# Patient Record
Sex: Male | Born: 1945 | Race: Black or African American | Hispanic: No | Smoking: Former smoker
Health system: Southern US, Community
[De-identification: ages and names within clinical notes are randomized; demographics above are authoritative.]

## PROBLEM LIST (undated history)

## (undated) ENCOUNTER — Emergency Department (HOSPITAL_COMMUNITY): Admission: EM | Payer: Medicare Other

## (undated) DIAGNOSIS — R319 Hematuria, unspecified: Secondary | ICD-10-CM

## (undated) DIAGNOSIS — K649 Unspecified hemorrhoids: Secondary | ICD-10-CM

## (undated) DIAGNOSIS — E049 Nontoxic goiter, unspecified: Secondary | ICD-10-CM

## (undated) DIAGNOSIS — C61 Malignant neoplasm of prostate: Secondary | ICD-10-CM

## (undated) DIAGNOSIS — Z8719 Personal history of other diseases of the digestive system: Secondary | ICD-10-CM

## (undated) DIAGNOSIS — Z960 Presence of urogenital implants: Secondary | ICD-10-CM

## (undated) DIAGNOSIS — N529 Male erectile dysfunction, unspecified: Secondary | ICD-10-CM

## (undated) DIAGNOSIS — M2550 Pain in unspecified joint: Secondary | ICD-10-CM

## (undated) DIAGNOSIS — Z978 Presence of other specified devices: Secondary | ICD-10-CM

## (undated) DIAGNOSIS — Z87440 Personal history of urinary (tract) infections: Secondary | ICD-10-CM

## (undated) DIAGNOSIS — M1990 Unspecified osteoarthritis, unspecified site: Secondary | ICD-10-CM

## (undated) HISTORY — DX: Nontoxic goiter, unspecified: E04.9

## (undated) HISTORY — PX: INGUINAL HERNIA REPAIR: SUR1180

## (undated) HISTORY — DX: Unspecified osteoarthritis, unspecified site: M19.90

## (undated) HISTORY — DX: Male erectile dysfunction, unspecified: N52.9

---

## 1997-11-30 ENCOUNTER — Emergency Department (HOSPITAL_COMMUNITY): Admission: EM | Admit: 1997-11-30 | Discharge: 1997-11-30 | Payer: Self-pay | Admitting: Emergency Medicine

## 2003-03-31 ENCOUNTER — Emergency Department (HOSPITAL_COMMUNITY): Admission: EM | Admit: 2003-03-31 | Discharge: 2003-03-31 | Payer: Self-pay | Admitting: Emergency Medicine

## 2003-04-17 ENCOUNTER — Encounter: Admission: RE | Admit: 2003-04-17 | Discharge: 2003-04-17 | Payer: Self-pay | Admitting: General Surgery

## 2003-04-20 ENCOUNTER — Ambulatory Visit (HOSPITAL_BASED_OUTPATIENT_CLINIC_OR_DEPARTMENT_OTHER): Admission: RE | Admit: 2003-04-20 | Discharge: 2003-04-20 | Payer: Self-pay | Admitting: General Surgery

## 2003-04-20 ENCOUNTER — Ambulatory Visit (HOSPITAL_COMMUNITY): Admission: RE | Admit: 2003-04-20 | Discharge: 2003-04-20 | Payer: Self-pay | Admitting: General Surgery

## 2003-08-17 ENCOUNTER — Emergency Department (HOSPITAL_COMMUNITY): Admission: EM | Admit: 2003-08-17 | Discharge: 2003-08-17 | Payer: Self-pay | Admitting: Family Medicine

## 2011-02-21 ENCOUNTER — Encounter: Payer: Self-pay | Admitting: Emergency Medicine

## 2011-02-21 ENCOUNTER — Emergency Department (HOSPITAL_COMMUNITY): Payer: Medicare Other

## 2011-02-21 ENCOUNTER — Emergency Department (HOSPITAL_COMMUNITY)
Admission: EM | Admit: 2011-02-21 | Discharge: 2011-02-21 | Disposition: A | Discharge status: Home / Self Care | Payer: Medicare Other | Attending: Emergency Medicine | Admitting: Emergency Medicine

## 2011-02-21 DIAGNOSIS — M51379 Other intervertebral disc degeneration, lumbosacral region without mention of lumbar back pain or lower extremity pain: Secondary | ICD-10-CM | POA: Insufficient documentation

## 2011-02-21 DIAGNOSIS — F17200 Nicotine dependence, unspecified, uncomplicated: Secondary | ICD-10-CM | POA: Insufficient documentation

## 2011-02-21 DIAGNOSIS — M545 Low back pain, unspecified: Secondary | ICD-10-CM | POA: Insufficient documentation

## 2011-02-21 DIAGNOSIS — M5137 Other intervertebral disc degeneration, lumbosacral region: Secondary | ICD-10-CM | POA: Insufficient documentation

## 2011-02-21 MED ORDER — ACETAMINOPHEN-CODEINE #3 300-30 MG PO TABS
1.0000 | ORAL_TABLET | Freq: Four times a day (QID) | ORAL | Status: AC | PRN
Start: 2011-02-21 — End: 2011-03-03

## 2011-02-21 MED ORDER — IBUPROFEN 800 MG PO TABS: 800.0000 mg | ORAL_TABLET | Freq: Three times a day (TID) | ORAL | Status: AC

## 2011-02-21 MED ORDER — IBUPROFEN 800 MG PO TABS
800.0000 mg | ORAL_TABLET | Freq: Once | ORAL | Status: AC
  Administered 2011-02-21: 800 mg via ORAL
  Filled 2011-02-21: qty 1

## 2011-02-21 NOTE — ED Notes (Signed)
Patient gave verbal understanding of plan of care.

## 2011-02-21 NOTE — ED Notes (Signed)
Pt states was doing yard work and hurt back .

## 2011-02-21 NOTE — ED Provider Notes (Signed)
History     CSN: [id number] Arrival date & time: 02/21/2011 10:44 AM   First MD Initiated Contact with Patient 02/21/11 1057      Chief Complaint  Patient presents with  . Back Pain    (Consider location/radiation/quality/duration/timing/severity/associated sxs/prior treatment) HPI  Patient states he has no known medical problems takes no medicine on a regular basis presents to emergency department complaining of lower back pain. Patient states that he was removing leaves out of his gutter approximally 5 days ago and twisted his back feeling a "pop" and since then ongoing midline lower back pain. Patient states pain is aggravated by touch and movement and has been improved temporarily with at-home ibuprofen use of 400 mg with last dose yesterday. Patient denies lower extremity numbness, tingling, weakness, saddle seat paresthesias, loss of bowel or bladder function. Patient denies radiation of pain. Denies pain in lower extremities. Patient denies abdominal pain, nausea or vomiting. History reviewed. No pertinent past medical history.  History reviewed. No pertinent past surgical history.  History reviewed. No pertinent family history.  History  Substance Use Topics  . Smoking status: Current Everyday Smoker -- 0.5 packs/day    Types: Cigarettes  . Smokeless tobacco: Not on file  . Alcohol Use: Yes      Review of Systems  All other systems reviewed and are negative.    Allergies  Review of patient's allergies indicates no known allergies.  Home Medications   Current Outpatient Rx  Name Route Sig Dispense Refill  . IBUPROFEN 200 MG PO TABS Oral Take 400 mg by mouth every 6 (six) hours as needed. pain       BP 122/80  Pulse 78  Temp(Src) 98 F (36.7 C) (Oral)  Resp 16  SpO2 100%  Physical Exam  Nursing note and vitals reviewed. Constitutional: He is oriented to person, place, and time. He appears well-developed and well-nourished. No distress.  HENT:  Head:  Normocephalic and atraumatic.  Eyes: Conjunctivae are normal.  Neck: Normal range of motion. Neck supple.  Cardiovascular: Normal rate, regular rhythm, normal heart sounds and intact distal pulses.  Exam reveals no gallop and no friction rub.   No murmur heard. Pulmonary/Chest: Effort normal and breath sounds normal. No respiratory distress. He has no wheezes. He has no rales. He exhibits no tenderness.  Abdominal: Bowel sounds are normal. He exhibits no distension and no mass. There is no tenderness. There is no rebound and no guarding.  Musculoskeletal: Normal range of motion. He exhibits tenderness. He exhibits no edema.       Lumbar back: He exhibits tenderness and bony tenderness. He exhibits no swelling and no edema.       Mild to moderate tenderness to palpation of lower midline lumbar region without surrounding soft tissue tenderness to palpation. Mild to moderate pain with flexion from waist that point tenderness. Full range of motion large hernias without difficulty or pain. 5 out of 5 strength of lower extremities and normal reflexes bilaterally.  Neurological: He is alert and oriented to person, place, and time. He has normal reflexes.  Skin: Skin is warm and dry. No rash noted. He is not diaphoretic. No erythema.  Psychiatric: He has a normal mood and affect.    ED Course  Procedures (including critical care time)  PO ibuprofen  Labs Reviewed - No data to display Dg Lumbar Spine Complete  02/21/2011  *RADIOLOGY REPORT*  Clinical Data: Low back pain.  LUMBAR SPINE - COMPLETE 4+ VIEW  Comparison: 08/17/2003  Findings: Mild lateral and anterior spurring throughout the lumbar spine.  Normal alignment.  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  Mild facet disease at the lumbosacral junction.  No fracture.  IMPRESSION: Spondylosis.  No acute findings.  Original Report Authenticated By: Cyndie Chime, M.D.     No diagnosis found.    MDM    Patient has no signs or symptoms of central cord compression or cauda equina. Bony midline tenderness of lower lumbar region without acute findings on x-ray of L-spine. Patient has 5 out of 5 strength of bilateral lower extremities with normal reflexes bilaterally. Patient ambulating without difficulty. No skin changes or rash.        Jenness Corner, [place] 02/21/11 1558

## 2011-02-21 NOTE — ED Provider Notes (Signed)
Medical screening examination/treatment/procedure(s) were performed by non-physician practitioner and as supervising physician I was immediately available for consultation/collaboration.    Kenadie Royce, MD 02/21/11 1615 

## 2011-08-05 ENCOUNTER — Emergency Department (HOSPITAL_COMMUNITY): Payer: Medicare Other

## 2011-08-05 ENCOUNTER — Encounter (HOSPITAL_COMMUNITY): Payer: Self-pay

## 2011-08-05 ENCOUNTER — Emergency Department (HOSPITAL_COMMUNITY)
Admission: EM | Admit: 2011-08-05 | Discharge: 2011-08-05 | Disposition: A | Discharge status: Home / Self Care | Payer: Medicare Other | Attending: Emergency Medicine | Admitting: Emergency Medicine

## 2011-08-05 DIAGNOSIS — M47812 Spondylosis without myelopathy or radiculopathy, cervical region: Secondary | ICD-10-CM | POA: Diagnosis not present

## 2011-08-05 DIAGNOSIS — F17200 Nicotine dependence, unspecified, uncomplicated: Secondary | ICD-10-CM | POA: Diagnosis not present

## 2011-08-05 DIAGNOSIS — L0201 Cutaneous abscess of face: Secondary | ICD-10-CM | POA: Insufficient documentation

## 2011-08-05 DIAGNOSIS — L255 Unspecified contact dermatitis due to plants, except food: Secondary | ICD-10-CM | POA: Diagnosis not present

## 2011-08-05 DIAGNOSIS — L259 Unspecified contact dermatitis, unspecified cause: Secondary | ICD-10-CM

## 2011-08-05 DIAGNOSIS — L03211 Cellulitis of face: Secondary | ICD-10-CM | POA: Insufficient documentation

## 2011-08-05 DIAGNOSIS — L0390 Cellulitis, unspecified: Secondary | ICD-10-CM

## 2011-08-05 DIAGNOSIS — R5381 Other malaise: Secondary | ICD-10-CM | POA: Diagnosis not present

## 2011-08-05 DIAGNOSIS — I889 Nonspecific lymphadenitis, unspecified: Secondary | ICD-10-CM | POA: Diagnosis not present

## 2011-08-05 DIAGNOSIS — T622X1A Toxic effect of other ingested (parts of) plant(s), accidental (unintentional), initial encounter: Secondary | ICD-10-CM | POA: Insufficient documentation

## 2011-08-05 DIAGNOSIS — R599 Enlarged lymph nodes, unspecified: Secondary | ICD-10-CM | POA: Diagnosis not present

## 2011-08-05 LAB — POCT I-STAT, CHEM 8
BUN: 10 mg/dL (ref 6–23)
Calcium, Ion: 1.22 mmol/L (ref 1.12–1.32)
Chloride: 102 meq/L (ref 96–112)
Creatinine, Ser: 0.9 mg/dL (ref 0.50–1.35)
Glucose, Bld: 99 mg/dL (ref 70–99)
HCT: 45 % (ref 39.0–52.0)
Hemoglobin: 15.3 g/dL (ref 13.0–17.0)
Potassium: 3.9 meq/L (ref 3.5–5.1)
Sodium: 139 meq/L (ref 135–145)
TCO2: 27 mmol/L (ref 0–100)

## 2011-08-05 MED ORDER — FAMOTIDINE 20 MG PO TABS: 20.0000 mg | ORAL_TABLET | Freq: Two times a day (BID) | ORAL | Status: DC

## 2011-08-05 MED ORDER — IOHEXOL 300 MG/ML  SOLN
100.0000 mL | Freq: Once | INTRAMUSCULAR | Status: AC | PRN
  Administered 2011-08-05: 100 mL via INTRAVENOUS

## 2011-08-05 MED ORDER — SODIUM CHLORIDE 0.9 % IV BOLUS (SEPSIS)
1000.0000 mL | Freq: Once | INTRAVENOUS | Status: AC
  Administered 2011-08-05: 1000 mL via INTRAVENOUS

## 2011-08-05 MED ORDER — METHYLPREDNISOLONE SODIUM SUCC 125 MG IJ SOLR
125.0000 mg | Freq: Once | INTRAMUSCULAR | Status: AC
  Administered 2011-08-05: 125 mg via INTRAVENOUS
  Filled 2011-08-05: qty 2

## 2011-08-05 MED ORDER — SODIUM CHLORIDE 0.9 % IV SOLN
INTRAVENOUS | Status: DC
  Administered 2011-08-05 (×2): via INTRAVENOUS

## 2011-08-05 MED ORDER — PREDNISONE 10 MG PO TABS: ORAL_TABLET | ORAL | Status: DC

## 2011-08-05 MED ORDER — DIPHENHYDRAMINE HCL 50 MG/ML IJ SOLN
25.0000 mg | Freq: Once | INTRAMUSCULAR | Status: AC
  Administered 2011-08-05: 25 mg via INTRAVENOUS
  Filled 2011-08-05: qty 1

## 2011-08-05 MED ORDER — FAMOTIDINE IN NACL 20-0.9 MG/50ML-% IV SOLN
20.0000 mg | Freq: Once | INTRAVENOUS | Status: AC
  Administered 2011-08-05: 20 mg via INTRAVENOUS
  Filled 2011-08-05: qty 50

## 2011-08-05 MED ORDER — SULFAMETHOXAZOLE-TRIMETHOPRIM 800-160 MG PO TABS: 2.0000 | ORAL_TABLET | Freq: Two times a day (BID) | ORAL | Status: AC

## 2011-08-05 NOTE — Discharge Instructions (Signed)
Drink plenty of fluids. Take the prednisone and Pepcid until gone. Take Benadryl 50 mg every 4-6 hours for itching and swelling. Take the Septra DS for possible infection (should be $4 at [employer], or [facility] or [facility]). Return to the emergency department if you get fever, have trouble breathing, or are unable to swallow. Call Dr.Wolicki's office to get a followup appointment to see if you need to have a biopsy of the lymph nodes in your neck.

## 2011-08-05 NOTE — ED Notes (Signed)
Pt in from home states came in contact with poison ivy 1 week ago states swelling to lips states feels like he got it in his mouth states can't eat states nausea and decreased appetite denies difficulty swallowing/breathing

## 2011-08-05 NOTE — ED Provider Notes (Signed)
History     CSN: [id number]  Arrival date & time 08/05/11  1640   First MD Initiated Contact with Patient 08/05/11 1655      Chief Complaint  Patient presents with  . Rash  . Weakness    (Consider location/radiation/quality/duration/timing/severity/associated sxs/prior treatment) HPI  Patient relates 8 days ago he was out trimming weeds off of a fence and he recalls a vine slapping him across the face. He reports the next day he started getting lesions on his upper lip consistent with poison ivy with blistering of clear fluid. He relates he has been using calamine lotion on it. He reports a decrease in his appetite for the past 4 days but denies lesions in his mouth. He denies fever. He states the rash has been very itching. He relates he started noticing some swelling underneath his jaw it is nonpainful. On gross inspection patient also noted to have a moderate swelling in the area of his left lateral neck which she states was evaluated about 5 years ago and he was told it was a "goiter". Patient relates he has never had a skin rash to poison ivy or oak in the past.  PCP none   History reviewed. No pertinent past medical history.  History reviewed. No pertinent past surgical history.  No family history on file.  History  Substance Use Topics  . Smoking status: Current Everyday Smoker -- 0.5 packs/day    Types: Cigarettes  . Smokeless tobacco: Not on file  . Alcohol Use: Yes  lives with spouse   Review of Systems  All other systems reviewed and are negative.    Allergies  Poison ivy extract  Home Medications  No current outpatient prescriptions on file.  BP 140/87  Pulse 88  Temp(Src) 98.1 F (36.7 C) (Oral)  Resp 16  SpO2 100%  Vital signs normal    Physical Exam  Nursing note and vitals reviewed. Constitutional: He is oriented to person, place, and time. He appears well-developed and well-nourished.  Non-toxic appearance. He does not appear ill. No  distress.  HENT:  Head: Normocephalic and atraumatic.  Right Ear: External ear normal.  Left Ear: External ear normal.  Nose: Nose normal. No mucosal edema or rhinorrhea.  Mouth/Throat: Oropharynx is clear and moist and mucous membranes are normal. No dental abscesses or uvula swelling.       he has a firm 3 cm nodular mass in his submental region that appears to be nontender, patient's noted to have bulging on the left side of his neck with a soft mass that appears to be underneath the strap muscles questionable consistency like a lipoma. Patient states he was told it was a goiter however he has no swelling in the midline of his neck.  Eyes: Conjunctivae and EOM are normal. Pupils are equal, round, and reactive to light.  Neck: Normal range of motion and full passive range of motion without pain. Neck supple.  Pulmonary/Chest: Effort normal. No respiratory distress. He has no rhonchi. He exhibits no crepitus.  Abdominal: Normal appearance.  Musculoskeletal: Normal range of motion.       Moves all extremities well.   Neurological: He is alert and oriented to person, place, and time. He has normal strength. No cranial nerve deficit.  Skin: Skin is warm, dry and intact. No rash noted. No erythema. No pallor.  Psychiatric: He has a normal mood and affect. His speech is normal and behavior is normal. His mood appears not anxious.  ED Course  Procedures (including critical care time)     Medications  0.9 %  sodium chloride infusion (  Intravenous New Bag/Given 08/05/11 1942)  predniSONE (DELTASONE) 10 MG tablet (not administered)  famotidine (PEPCID) 20 MG tablet (not administered)  sulfamethoxazole-trimethoprim (SEPTRA DS) 800-160 MG per tablet (not administered)  sodium chloride 0.9 % bolus 1,000 mL (1000 mL Intravenous Given 08/05/11 1739)  methylPREDNISolone sodium succinate (SOLU-MEDROL) 125 mg/2 mL injection 125 mg (125 mg Intravenous Given 08/05/11 1737)  famotidine (PEPCID) IVPB 20  mg (20 mg Intravenous Given 08/05/11 1814)  diphenhydrAMINE (BENADRYL) injection 25 mg (25 mg Intravenous Given 08/05/11 1736)  iohexol (OMNIPAQUE) 300 MG/ML solution 100 mL (100 mL Intravenous Contrast Given 08/05/11 1818)   Pt states he is feeling better. Have discussed his CT results.   Results for orders placed during the hospital encounter of 08/05/11  POCT I-STAT, CHEM 8      Component Value Range   Sodium 139  135 - 145 (mEq/L)   Potassium 3.9  3.5 - 5.1 (mEq/L)   Chloride 102  96 - 112 (mEq/L)   BUN 10  6 - 23 (mg/dL)   Creatinine, Ser 0.93  0.50 - 1.35 (mg/dL)   Glucose, Bld 99  70 - 99 (mg/dL)   Calcium, Ion 2.67  1.24 - 1.32 (mmol/L)   TCO2 27  0 - 100 (mmol/L)   Hemoglobin 15.3  13.0 - 17.0 (g/dL)   HCT 58.0  99.8 - 33.8 (%)   Laboratory interpretation all normal  Ct Soft Tissue Neck W Contrast  08/05/2011  *RADIOLOGY REPORT*  Clinical Data: Swelling in the left lateral neck.  Submandibular swelling.  Swelling of lips.  Possible exposure to poison ivy.  CT NECK WITH CONTRAST  Technique:  Multidetector CT imaging of the neck was performed with intravenous contrast.  Contrast: OMNIPAQUE IOHEXOL 300 MG/ML  SOLN  Comparison: None.  Findings: Diffuse subcutaneous edematous changes and skin thickening are present about the lips and periorbital face.  Skin thickening is more prominent on the left.  A left paramidline submental lymph node is hyperdense and rounded, measuring 2.0 x 1.8 x 1.8 cm.  A slightly smaller right paramidline submental lymph node is evident.  A homogeneously enhancing right submandibular lymph node measures 1.9 x 2.7 x 1.7 cm.   A left submandibular node measures 1.2 x 2.1 x 1.3 mm.  No significant mucosal or submucosal lesion is present.  The vocal cords are midline and symmetric.  The left lobe of the thyroid is diffusely enlarged.  It is heterogeneous with several calcifications, measuring 3.9 x 5.2 x 11.7 cm.  Several smaller nodules are present in the right  lobe.  Enlarged posterior level IV and supraclavicular lymph nodes are present bilaterally.  The largest left-sided node measures 19 x 12 mm.  The largest right-sided node is a posterior level III node, measuring 1.6 x 1.1 cm.  Level II nodes are smaller.  A high right paratracheal lymph node measures 10 mm in short axis.  Other smaller superior mediastinal nodes are present as well.  The lung apices are clear.  The mild degenerative changes are noted in the lower cervical spine.  IMPRESSION:  1.  Enlarged rounded submental and bilateral submandibular lymph nodes. 2.  Diffuse edematous changes of the periorbital face, worse on the left.  No discrete mass is evident.  This could be related to a toxic exposure.  The findings are compatible with cellulitis. 3.  Additional enlarged lymph nodes  are present in the posterior level III, level IV, and supraclavicular stations.  Although the level I nodes could be related to the edematous changes of the face, the diffuse adenopathy raises concern for lymphoma or leukemia. 4.  Minimal spondylosis. 5.  Diffuse heterogeneous enlargement of the left lobe of the thyroid, compatible with a multinodular goiter.  It is unclear if the entire left lobe represents a single enlarged nodule or multiple tiny nodules. 6.  Smaller nodules are present in the right lobe which is not significantly enlarged.  Original Report Authenticated By: Jamesetta Orleans. MATTERN, M.D.       1. Contact dermatitis   2. Cellulitis   3. Cervical lymphadenitis     New Prescriptions   FAMOTIDINE (PEPCID) 20 MG TABLET    Take 1 tablet (20 mg total) by mouth 2 (two) times daily.   PREDNISONE (DELTASONE) 10 MG TABLET    Take 3 po QD x 2d starting tomorrow, then 2 po QD x 3d then 1 po QD x 3d   SULFAMETHOXAZOLE-TRIMETHOPRIM (SEPTRA DS) 800-160 MG PER TABLET    Take 2 tablets by mouth 2 (two) times daily.   Plan discharge Devoria Albe, MD, FACEP   MDM          Ward Givens, MD 08/05/11 2023

## 2011-08-05 NOTE — ED Notes (Signed)
Pt also states generalized weakness

## 2012-06-25 ENCOUNTER — Ambulatory Visit (INDEPENDENT_AMBULATORY_CARE_PROVIDER_SITE_OTHER): Payer: Medicare PPO | Admitting: Internal Medicine

## 2012-06-25 ENCOUNTER — Other Ambulatory Visit (INDEPENDENT_AMBULATORY_CARE_PROVIDER_SITE_OTHER): Payer: Medicare PPO

## 2012-06-25 ENCOUNTER — Encounter: Payer: Self-pay | Admitting: Internal Medicine

## 2012-06-25 VITALS — BP 130/82 | HR 78 | Temp 97.7°F | Ht 72.0 in | Wt 179.8 lb

## 2012-06-25 DIAGNOSIS — R5383 Other fatigue: Secondary | ICD-10-CM

## 2012-06-25 DIAGNOSIS — R5381 Other malaise: Secondary | ICD-10-CM

## 2012-06-25 DIAGNOSIS — Z1211 Encounter for screening for malignant neoplasm of colon: Secondary | ICD-10-CM

## 2012-06-25 DIAGNOSIS — M1990 Unspecified osteoarthritis, unspecified site: Secondary | ICD-10-CM

## 2012-06-25 DIAGNOSIS — E049 Nontoxic goiter, unspecified: Secondary | ICD-10-CM

## 2012-06-25 DIAGNOSIS — Z13220 Encounter for screening for lipoid disorders: Secondary | ICD-10-CM

## 2012-06-25 DIAGNOSIS — Z125 Encounter for screening for malignant neoplasm of prostate: Secondary | ICD-10-CM

## 2012-06-25 DIAGNOSIS — N529 Male erectile dysfunction, unspecified: Secondary | ICD-10-CM

## 2012-06-25 DIAGNOSIS — M129 Arthropathy, unspecified: Secondary | ICD-10-CM

## 2012-06-25 HISTORY — DX: Nontoxic goiter, unspecified: E04.9

## 2012-06-25 HISTORY — DX: Male erectile dysfunction, unspecified: N52.9

## 2012-06-25 LAB — LIPID PANEL
Cholesterol: 160 mg/dL (ref 0–200)
HDL: 46.8 mg/dL (ref >39.00)
LDL Cholesterol: 98 mg/dL (ref 0–99)
Triglycerides: 74 mg/dL (ref 0.0–149.0)
VLDL: 14.8 mg/dL (ref 0.0–40.0)

## 2012-06-25 LAB — CBC
MCHC: 33.3 g/dL (ref 30.0–36.0)
RDW: 15.1 % — ABNORMAL HIGH (ref 11.5–14.6)

## 2012-06-25 LAB — T3: T3, Total: 105.7 ng/dL (ref 80.0–204.0)

## 2012-06-25 LAB — BASIC METABOLIC PANEL
Chloride: 105 mEq/L (ref 96–112)
Potassium: 4.1 mEq/L (ref 3.5–5.1)
Sodium: 140 mEq/L (ref 135–145)

## 2012-06-25 MED ORDER — VARDENAFIL HCL 5 MG PO TABS: 5.0000 mg | ORAL_TABLET | ORAL | Status: DC | PRN

## 2012-06-25 MED ORDER — DICLOFENAC SODIUM 75 MG PO TBEC: 75.0000 mg | DELAYED_RELEASE_TABLET | Freq: Two times a day (BID) | ORAL | Status: DC

## 2012-06-25 NOTE — Assessment & Plan Note (Signed)
eRx for diclofenac BID Take with food to avoid stomach irritation

## 2012-06-25 NOTE — Progress Notes (Signed)
HPI  Pt presents to the clinic today to establish care. He has not seen a PCP in the past. He has no concerns today.  Flu: never Tetanus: 2009 Pneumovax: never Zostavax: never Colonoscopy: never Eye doctor: as needed Dentist: as needed  History reviewed. No pertinent past medical history.  No current outpatient prescriptions on file.   No current facility-administered medications for this visit.    Allergies  Allergen Reactions  . Poison Ivy Extract (Extract Of Poison Ivy) Nausea And Vomiting and Rash    Family History  Problem Relation Age of Onset  . Heart disease Sister   . Stroke Sister   . Cancer Paternal Uncle   . Diabetes Neg Hx     History   Social History  . Marital Status: Divorced    Spouse Name: N/A    Number of Children: N/A  . Years of Education: 9   Occupational History  . Retired     Press photographer   Social History Main Topics  . Smoking status: Current Every Day Smoker -- 0.25 packs/day for 25 years    Types: Cigarettes  . Smokeless tobacco: Never Used  . Alcohol Use: Yes  . Drug Use: Yes    Special: Marijuana  . Sexually Active: Yes   Other Topics Concern  . Not on file   Social History Narrative   Regular exercise-no   Caffeine Use-yes    ROS:  Constitutional: Pt reports fatigue. Denies fever, malaise, headache or abrupt weight changes.  HEENT: Denies eye pain, eye redness, ear pain, ringing in the ears, wax buildup, runny nose, nasal congestion, bloody nose, or sore throat. Respiratory: Denies difficulty breathing, shortness of breath, cough or sputum production.   Cardiovascular: Denies chest pain, chest tightness, palpitations or swelling in the hands or feet.  Gastrointestinal: Denies abdominal pain, bloating, constipation, diarrhea or blood in the stool.  GU: Pt reports erectile dysfunction. Denies frequency, urgency, pain with urination, blood in urine, odor or discharge. 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 Denies decrease in range of motion, difficulty with gait, muscle pain or joint swelling.  Skin: Denies redness, rashes, lesions or ulcercations.  Neurological: Denies dizziness, difficulty with memory, difficulty with speech or problems with balance and coordination.   No other specific complaints in a complete review of systems (except as listed in HPI above).  PE:  BP 130/82  Pulse 78  Temp(Src) 97.7 F (36.5 C) (Oral)  Ht 6' (1.829 m)  Wt 179 lb 12 oz (81.534 kg)  BMI 24.37 kg/m2  SpO2 95% Wt Readings from Last 3 Encounters:  06/25/12 179 lb 12 oz (81.534 kg)    General: Appears his stated age, well developed, well nourished in NAD. HEENT: Head: normal shape and size; Eyes: sclera white, no icterus, conjunctiva pink, PERRLA and EOMs intact; Ears: Tm's gray and intact, normal light reflex; Nose: mucosa pink and moist, septum midline; Throat/Mouth: Teeth present, mucosa pink and moist, no lesions or ulcerations noted.  Neck: Normal range of motion. Neck supple, trachea midline. No thyromegaly present. Large goiter on left side. Cardiovascular: Normal rate and rhythm. S1,S2 noted.  No murmur, rubs or gallops noted. No JVD or BLE edema. No carotid bruits noted. Pulmonary/Chest: Normal effort and positive vesicular breath sounds. No respiratory distress. No wheezes, rales or ronchi noted.  Abdomen: Soft and nontender. Normal bowel sounds, no bruits noted. No distention or masses noted. Liver, spleen and kidneys non palpable. Musculoskeletal: Normal range of  motion. No signs of joint swelling. No difficulty with gait.  Neurological: Alert and oriented. Cranial nerves II-XII intact. Coordination normal. +DTRs bilaterally. Psychiatric: Mood and affect normal. Behavior is normal. Judgment and thought content normal.      Assessment and Plan:  Preventative Health Maintenance:  Encouraged pt to stop smoking, counseled for approx 5 minutes, cessation materials  given Will obtain some basic labwork today.

## 2012-06-25 NOTE — Patient Instructions (Signed)
Health Maintenance, Males A healthy lifestyle and preventative care can promote health and wellness.  Maintain regular health, dental, and eye exams.  Eat a healthy diet. Foods like vegetables, fruits, whole grains, low-fat dairy products, and lean protein foods contain the nutrients you need without too many calories. Decrease your intake of foods high in solid fats, added sugars, and salt. Get information about a proper diet from your caregiver, if necessary.  Regular physical exercise is one of the most important things you can do for your health. Most adults should get at least 150 minutes of moderate-intensity exercise (any activity that increases your heart rate and causes you to sweat) each week. In addition, most adults need muscle-strengthening exercises on 2 or more days a week.   Maintain a healthy weight. The body mass index (BMI) is a screening tool to identify possible weight problems. It provides an estimate of body fat based on height and weight. Your caregiver can help determine your BMI, and can help you achieve or maintain a healthy weight. For adults 20 years and older:  A BMI below 18.5 is considered underweight.  A BMI of 18.5 to 24.9 is normal.  A BMI of 25 to 29.9 is considered overweight.  A BMI of 30 and above is considered obese.  Maintain normal blood lipids and cholesterol by exercising and minimizing your intake of saturated fat. Eat a balanced diet with plenty of fruits and vegetables. Blood tests for lipids and cholesterol should begin at age 20 and be repeated every 5 years. If your lipid or cholesterol levels are high, you are over 50, or you are a high risk for heart disease, you may need your cholesterol levels checked more frequently.Ongoing high lipid and cholesterol levels should be treated with medicines, if diet and exercise are not effective.  If you smoke, find out from your caregiver how to quit. If you do not use tobacco, do not start.  If you  choose to drink alcohol, do not exceed 2 drinks per day. One drink is considered to be 12 ounces (355 mL) of beer, 5 ounces (148 mL) of wine, or 1.5 ounces (44 mL) of liquor.  Avoid use of street drugs. Do not share needles with anyone. Ask for help if you need support or instructions about stopping the use of drugs.  High blood pressure causes heart disease and increases the risk of stroke. Blood pressure should be checked at least every 1 to 2 years. Ongoing high blood pressure should be treated with medicines if weight loss and exercise are not effective.  If you are 45 to 68 years old, ask your caregiver if you should take aspirin to prevent heart disease.  Diabetes screening involves taking a blood sample to check your fasting blood sugar level. This should be done once every 3 years, after age 45, if you are within normal weight and without risk factors for diabetes. Testing should be considered at a younger age or be carried out more frequently if you are overweight and have at least 1 risk factor for diabetes.  Colorectal cancer can be detected and often prevented. Most routine colorectal cancer screening begins at the age of 50 and continues through age 75. However, your caregiver may recommend screening at an earlier age if you have risk factors for colon cancer. On a yearly basis, your caregiver may provide home test kits to check for hidden blood in the stool. Use of a small camera at the end of a tube,   to directly examine the colon (sigmoidoscopy or colonoscopy), can detect the earliest forms of colorectal cancer. Talk to your caregiver about this at age 50, when routine screening begins. Direct examination of the colon should be repeated every 5 to 10 years through age 75, unless early forms of pre-cancerous polyps or small growths are found.  Hepatitis C blood testing is recommended for all people born from 1945 through 1965 and any individual with known risks for hepatitis C.  Healthy  men should no longer receive prostate-specific antigen (PSA) blood tests as part of routine cancer screening. Consult with your caregiver about prostate cancer screening.  Testicular cancer screening is not recommended for adolescents or adult males who have no symptoms. Screening includes self-exam, caregiver exam, and other screening tests. Consult with your caregiver about any symptoms you have or any concerns you have about testicular cancer.  Practice safe sex. Use condoms and avoid high-risk sexual practices to reduce the spread of sexually transmitted infections (STIs).  Use sunscreen with a sun protection factor (SPF) of 30 or greater. Apply sunscreen liberally and repeatedly throughout the day. You should seek shade when your shadow is shorter than you. Protect yourself by wearing long sleeves, pants, a wide-brimmed hat, and sunglasses year round, whenever you are outdoors.  Notify your caregiver of new moles or changes in moles, especially if there is a change in shape or color. Also notify your caregiver if a mole is larger than the size of a pencil eraser.  A one-time screening for abdominal aortic aneurysm (AAA) and surgical repair of large AAAs by sound wave imaging (ultrasonography) is recommended for ages 65 to 75 years who are current or former smokers.  Stay current with your immunizations. Document Released: 09/09/2007 Document Revised: 06/05/2011 Document Reviewed: 08/08/2010 [employer] Patient Information 2013 [employer].  

## 2012-06-25 NOTE — Assessment & Plan Note (Signed)
Will check thyroid levels today May need ultrasound of large goiter on right side

## 2012-06-25 NOTE — Assessment & Plan Note (Signed)
Will trial levitra

## 2012-06-26 ENCOUNTER — Encounter: Payer: Self-pay | Admitting: Internal Medicine

## 2012-06-26 ENCOUNTER — Other Ambulatory Visit: Payer: Self-pay | Admitting: Internal Medicine

## 2012-06-26 DIAGNOSIS — R972 Elevated prostate specific antigen [PSA]: Secondary | ICD-10-CM

## 2012-07-16 DIAGNOSIS — N529 Male erectile dysfunction, unspecified: Secondary | ICD-10-CM | POA: Diagnosis not present

## 2012-07-16 DIAGNOSIS — N401 Enlarged prostate with lower urinary tract symptoms: Secondary | ICD-10-CM | POA: Diagnosis not present

## 2012-07-16 DIAGNOSIS — R972 Elevated prostate specific antigen [PSA]: Secondary | ICD-10-CM | POA: Diagnosis not present

## 2012-08-01 ENCOUNTER — Other Ambulatory Visit: Payer: Self-pay | Admitting: Internal Medicine

## 2012-08-01 ENCOUNTER — Ambulatory Visit (AMBULATORY_SURGERY_CENTER): Payer: Medicare PPO | Admitting: *Deleted

## 2012-08-01 VITALS — Ht 73.0 in | Wt 179.6 lb

## 2012-08-01 DIAGNOSIS — Z1211 Encounter for screening for malignant neoplasm of colon: Secondary | ICD-10-CM

## 2012-08-01 MED ORDER — NA SULFATE-K SULFATE-MG SULF 17.5-3.13-1.6 GM/177ML PO SOLN: 1.0000 | Freq: Once | ORAL | Status: DC

## 2012-08-01 NOTE — Progress Notes (Signed)
No egg or soy allergy. ewm No problems with sedation in the past. ewm No oxygen use at home. ewm

## 2012-08-13 DIAGNOSIS — R972 Elevated prostate specific antigen [PSA]: Secondary | ICD-10-CM | POA: Diagnosis not present

## 2012-08-15 ENCOUNTER — Encounter: Payer: Medicare PPO | Admitting: Internal Medicine

## 2012-08-26 ENCOUNTER — Other Ambulatory Visit (HOSPITAL_COMMUNITY): Payer: Self-pay | Admitting: Urology

## 2012-08-26 DIAGNOSIS — N529 Male erectile dysfunction, unspecified: Secondary | ICD-10-CM | POA: Diagnosis not present

## 2012-08-26 DIAGNOSIS — C61 Malignant neoplasm of prostate: Secondary | ICD-10-CM | POA: Diagnosis not present

## 2012-09-05 ENCOUNTER — Encounter (HOSPITAL_COMMUNITY): Payer: Self-pay

## 2012-09-05 ENCOUNTER — Encounter (HOSPITAL_COMMUNITY)
Admission: RE | Admit: 2012-09-05 | Discharge: 2012-09-05 | Disposition: A | Discharge status: Home / Self Care | Payer: Medicare Other | Source: Ambulatory Visit | Attending: Urology | Admitting: Urology

## 2012-09-05 DIAGNOSIS — C61 Malignant neoplasm of prostate: Secondary | ICD-10-CM | POA: Diagnosis not present

## 2012-09-05 MED ORDER — TECHNETIUM TC 99M MEDRONATE IV KIT
25.6000 | PACK | Freq: Once | INTRAVENOUS | Status: AC | PRN
  Administered 2012-09-05: 25.6 via INTRAVENOUS

## 2012-09-16 ENCOUNTER — Ambulatory Visit (INDEPENDENT_AMBULATORY_CARE_PROVIDER_SITE_OTHER): Payer: Medicare PPO | Admitting: Internal Medicine

## 2012-09-16 ENCOUNTER — Ambulatory Visit: Payer: Medicare PPO | Admitting: Internal Medicine

## 2012-09-16 ENCOUNTER — Encounter: Payer: Self-pay | Admitting: Internal Medicine

## 2012-09-16 VITALS — BP 108/72 | HR 77 | Temp 97.1°F | Ht 73.0 in | Wt 171.2 lb

## 2012-09-16 DIAGNOSIS — T783XXA Angioneurotic edema, initial encounter: Secondary | ICD-10-CM

## 2012-09-16 DIAGNOSIS — K1120 Sialoadenitis, unspecified: Secondary | ICD-10-CM

## 2012-09-16 MED ORDER — METHYLPREDNISOLONE ACETATE 80 MG/ML IJ SUSP
80.0000 mg | Freq: Once | INTRAMUSCULAR | Status: AC
  Administered 2012-09-16: 80 mg via INTRAMUSCULAR

## 2012-09-16 MED ORDER — SULFAMETHOXAZOLE-TRIMETHOPRIM 800-160 MG PO TABS: 1.0000 | ORAL_TABLET | Freq: Two times a day (BID) | ORAL | Status: DC

## 2012-09-16 NOTE — Progress Notes (Signed)
Subjective:    Patient ID: Eric Osborn, male    DOB: 1945/05/01, 67 y.o.   MRN: [id number]  HPI  Pt presents to the clinic today with c/o facial swelling. This started 1 week ago after cutting poison ivy with a weed eater. As he was cutting, he noticed something hit him in the lip. He went in the house and washed his upper lip with peroxide. He woke up the next morning and had swelling of the upper lip, right side of the next and underneath his chin. He denies difficulty breathing, swallowing. He has not tried anything OTC to decrease the swelling. He thinks the swelling did get worse after a fall out of bed that occurred the day after. He did hit his head on the table. He denies dizziness, headache or blurred vision. He denies fever, chills or body aches.  Review of Systems      Past Medical History  Diagnosis Date  . Arthritis     left knee, left shoulder    Current Outpatient Prescriptions  Medication Sig Dispense Refill  . diclofenac (VOLTAREN) 75 MG EC tablet TAKE ONE TABLET BY MOUTH TWICE DAILY  60 tablet  1  . Na Sulfate-K Sulfate-Mg Sulf (SUPREP BOWEL PREP) SOLN Take 1 kit by mouth once. suprep as directed. No substitutions  354 mL  0  . vardenafil (LEVITRA) 5 MG tablet Take 1 tablet (5 mg total) by mouth as needed for erectile dysfunction.  30 tablet  0   No current facility-administered medications for this visit.    Allergies  Allergen Reactions  . Poison Ivy Extract (Extract Of Poison Ivy) Nausea And Vomiting and Rash    Family History  Problem Relation Age of Onset  . Heart disease Sister   . Stroke Sister   . Colon cancer Paternal Uncle   . Diabetes Neg Hx     History   Social History  . Marital Status: Single    Spouse Name: N/A    Number of Children: N/A  . Years of Education: 9   Occupational History  . Retired     Press photographer   Social History Main Topics  . Smoking status: Current Every Day Smoker -- 0.25 packs/day for 25 years    Types:  Cigarettes  . Smokeless tobacco: Former Neurosurgeon  . Alcohol Use: 3.6 oz/week    6 Cans of beer per week  . Drug Use: Yes    Special: Marijuana     Comment: 3 x a month uses marijuana  . Sexually Active: Yes   Other Topics Concern  . Not on file   Social History Narrative   Regular exercise-no   Caffeine Use-yes     Constitutional: Denies fever, malaise, fatigue, headache or abrupt weight changes.  HEENT: Pt reports facial swelling and laceration to upper lip. Denies eye pain, eye redness, ear pain, ringing in the ears, wax buildup, runny nose, nasal congestion, bloody nose, or sore throat. Respiratory: Denies difficulty breathing, shortness of breath, cough or sputum production.   Skin: Denies redness, rashes, lesions or ulcercations.  Neurological: Denies dizziness, difficulty with memory, difficulty with speech or problems with balance and coordination.   No other specific complaints in a complete review of systems (except as listed in HPI above).  Objective:   Physical Exam  BP 108/72  Pulse 77  Temp(Src) 97.1 F (36.2 C) (Oral)  Ht 6\' 1"  (1.854 m)  Wt 171 lb 3.2 oz (77.656 kg)  BMI 22.59 kg/m2  SpO2 95% Wt Readings from Last 3 Encounters:  09/16/12 171 lb 3.2 oz (77.656 kg)  08/01/12 179 lb 9.6 oz (81.466 kg)  06/25/12 179 lb 12 oz (81.534 kg)    General: Appears her stated age, well developed, well nourished in NAD. Skin: Warm, dry and intact. No rashes, lesions or ulcerations noted. Small laceration to midline upper lip, surrounded by erythema. Small scab noted on posterior head with evidence of healing. HEENT: Head: normal shape and size; Eyes: sclera white, no icterus, conjunctiva pink, PERRLA and EOMs intact; Ears: Tm's gray and intact, normal light reflex; Nose: mucosa pink and moist, septum midline; Throat/Mouth: Teeth present, mucosa pink and moist, no exudate, lesions or ulcerations noted.  Neck: Normal range of motion. Neck supple, trachea midline. 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 Swelling noted of the submandibular parotid gland as well as the parotid on the right side below the jaw. Cardiovascular: Normal rate and rhythm. S1,S2 noted.  No murmur, rubs or gallops noted. No JVD or BLE edema. No carotid bruits noted. Pulmonary/Chest: Normal effort and positive vesicular breath sounds. No respiratory distress. No wheezes, rales or ronchi noted.  Neurological: Alert and oriented. Cranial nerves II-XII intact. Coordination normal. +DTRs bilaterally.   BMET    Component Value Date/Time   NA 140 06/25/2012 1517   K 4.1 06/25/2012 1517   CL 105 06/25/2012 1517   CO2 28 06/25/2012 1517   GLUCOSE 90 06/25/2012 1517   BUN 13 06/25/2012 1517   CREATININE 0.9 06/25/2012 1517   CALCIUM 9.6 06/25/2012 1517    Lipid Panel     Component Value Date/Time   CHOL 160 06/25/2012 1517   TRIG 74.0 06/25/2012 1517   HDL 46.80 06/25/2012 1517   CHOLHDL 3 06/25/2012 1517   VLDL 14.8 06/25/2012 1517   LDLCALC 98 06/25/2012 1517    CBC    Component Value Date/Time   WBC 7.3 06/25/2012 1517   RBC 4.72 06/25/2012 1517   HGB 13.9 06/25/2012 1517   HCT 41.9 06/25/2012 1517   PLT 251.0 06/25/2012 1517   MCV 88.6 06/25/2012 1517   MCHC 33.3 06/25/2012 1517   RDW 15.1* 06/25/2012 1517    Hgb A1C No results found for this basename: HGBA1C         Assessment & Plan:   Angioedema of lips, secondary to laceration:  Take Benadryl as needed Place Neosporin over the laceration 80 mg Depo IM today  Parotitis, submandibular and right parotid gland, new onset:  eRx for septra BID x 7 days Let me know if not better after finishing the course of abx

## 2012-09-16 NOTE — Patient Instructions (Addendum)
Parotitis °Parotitis is soreness and swelling (inflammation) of one or both parotid glands. The parotid glands produce saliva. They are located on each side of the face, below and in front of the earlobes. The saliva produced comes out of tiny openings (ducts) inside the cheeks. In most cases, parotitis goes away over time or with treatment. If your parotitis is caused by certain long-term (chronic) diseases, it may come back again.  °CAUSES  °Parotitis can be caused by: °· Viral infections. Mumps is one viral infection that can cause parotitis. °· Bacterial infections. °· Blockage of the salivary ducts due to a salivary stone. °· Narrowing of the salivary ducts. °· Swelling of the salivary ducts. °· Dehydration. °· Autoimmune conditions, such as sarcoidosis or Sjogren's syndrome. °· Air from activities such as scuba diving, glass blowing, or playing an instrument (rare). °· Human immunodeficiency virus (HIV) or acquired immunodeficiency syndrome (AIDS). °· Tuberculosis. °SYMPTOMS  °· The ears may appear to be pushed up and out from their normal position. °· Redness (erythema) of the skin over the parotid glands. °· Pain and tenderness over the parotid glands. °· Swelling in the parotid gland area. °· Yellowish-white fluid (pus) coming from the ducts inside the cheeks. °· Dry mouth. °· Bad taste in the mouth. °DIAGNOSIS  °Your caregiver may determine that you have parotitis based on your symptoms and a physical exam. A sample of fluid may also be taken from the parotid gland and tested to find the cause of your infection. X-rays or computed tomography (CT) scans may be taken if your caregiver thinks you might have a salivary stone blocking your salivary duct. °TREATMENT  °Treatment varies depending upon the cause of your parotitis. If your parotitis is caused by mumps, no treatment is needed. The condition will go away on its own after 7 to 10 days. In other cases, treatment may include: °· Antibiotics if your  infection was caused by bacteria. °· Pain medicines. °· Gland massage. °· Eating sour candy to increase your saliva production. °· Removal of salivary stones. Your caregiver may flush stones out with fluids or remove them with tweezers. °· Surgery to remove the parotid glands. °HOME CARE INSTRUCTIONS  °· If you were given antibiotics, take them as directed. Finish them even if you start to feel better. °· Put warm compresses on the sore area. °· Only take over-the-counter or prescription medicines for pain, discomfort, or fever as directed by your caregiver. °· Drink enough fluids to keep your urine clear or pale yellow. °SEEK IMMEDIATE MEDICAL CARE IF:  °· You have increasing pain or swelling that is not controlled with medicine. °· You have a fever. °MAKE SURE YOU: °· Understand these instructions. °· Will watch your condition. °· Will get help right away if you are not doing well or get worse. °Document Released: 09/02/2001 Document Revised: 06/05/2011 Document Reviewed: 02/06/2011 °ExitCare® Patient Information ©2014 [employer]. ° °

## 2012-09-16 NOTE — Addendum Note (Signed)
Addended by: Brenton Grills C on: 09/16/2012 11:28 AM   Modules accepted: Orders

## 2012-09-30 ENCOUNTER — Telehealth: Payer: Self-pay | Admitting: Internal Medicine

## 2012-09-30 ENCOUNTER — Encounter: Payer: Medicare PPO | Admitting: Internal Medicine

## 2012-09-30 DIAGNOSIS — C61 Malignant neoplasm of prostate: Secondary | ICD-10-CM | POA: Diagnosis not present

## 2012-10-03 ENCOUNTER — Other Ambulatory Visit: Payer: Self-pay | Admitting: Urology

## 2012-10-16 DIAGNOSIS — C61 Malignant neoplasm of prostate: Secondary | ICD-10-CM | POA: Diagnosis not present

## 2012-10-16 DIAGNOSIS — R279 Unspecified lack of coordination: Secondary | ICD-10-CM | POA: Diagnosis not present

## 2012-10-16 DIAGNOSIS — M6281 Muscle weakness (generalized): Secondary | ICD-10-CM | POA: Diagnosis not present

## 2012-10-26 ENCOUNTER — Emergency Department (HOSPITAL_COMMUNITY)
Admission: EM | Admit: 2012-10-26 | Discharge: 2012-10-26 | Disposition: A | Discharge status: Home / Self Care | Payer: Medicare Other | Attending: Emergency Medicine | Admitting: Emergency Medicine

## 2012-10-26 ENCOUNTER — Encounter (HOSPITAL_COMMUNITY): Payer: Self-pay | Admitting: Emergency Medicine

## 2012-10-26 DIAGNOSIS — F17200 Nicotine dependence, unspecified, uncomplicated: Secondary | ICD-10-CM | POA: Diagnosis not present

## 2012-10-26 DIAGNOSIS — Z79899 Other long term (current) drug therapy: Secondary | ICD-10-CM | POA: Insufficient documentation

## 2012-10-26 DIAGNOSIS — M129 Arthropathy, unspecified: Secondary | ICD-10-CM | POA: Diagnosis not present

## 2012-10-26 DIAGNOSIS — K644 Residual hemorrhoidal skin tags: Secondary | ICD-10-CM | POA: Diagnosis not present

## 2012-10-26 DIAGNOSIS — K649 Unspecified hemorrhoids: Secondary | ICD-10-CM | POA: Diagnosis not present

## 2012-10-26 DIAGNOSIS — R339 Retention of urine, unspecified: Secondary | ICD-10-CM

## 2012-10-26 DIAGNOSIS — R109 Unspecified abdominal pain: Secondary | ICD-10-CM | POA: Diagnosis not present

## 2012-10-26 DIAGNOSIS — Z9889 Other specified postprocedural states: Secondary | ICD-10-CM | POA: Diagnosis not present

## 2012-10-26 LAB — URINALYSIS, ROUTINE W REFLEX MICROSCOPIC
Bilirubin Urine: NEGATIVE
Glucose, UA: NEGATIVE mg/dL
Hgb urine dipstick: NEGATIVE
Ketones, ur: NEGATIVE mg/dL
Protein, ur: NEGATIVE mg/dL
Specific Gravity, Urine: 1.017 (ref 1.005–1.030)
Urobilinogen, UA: 1 mg/dL (ref 0.0–1.0)

## 2012-10-26 LAB — BASIC METABOLIC PANEL
CO2: 27 mEq/L (ref 19–32)
Calcium: 10.1 mg/dL (ref 8.4–10.5)
Glucose, Bld: 126 mg/dL — ABNORMAL HIGH (ref 70–99)
Sodium: 134 mEq/L — ABNORMAL LOW (ref 135–145)

## 2012-10-26 LAB — CBC WITH DIFFERENTIAL/PLATELET
Eosinophils Relative: 0 % (ref 0–5)
HCT: 41 % (ref 39.0–52.0)
Hemoglobin: 13.6 g/dL (ref 13.0–17.0)
Lymphocytes Relative: 53 % — ABNORMAL HIGH (ref 12–46)
Lymphs Abs: 4.4 10*3/uL — ABNORMAL HIGH (ref 0.7–4.0)
MCV: 88.4 fL (ref 78.0–100.0)
Monocytes Relative: 6 % (ref 3–12)
Platelets: 295 10*3/uL (ref 150–400)
RBC: 4.64 MIL/uL (ref 4.22–5.81)
WBC: 8.3 10*3/uL (ref 4.0–10.5)

## 2012-10-26 MED ORDER — HYDROCORTISONE 2.5 % RE CREA: TOPICAL_CREAM | Freq: Two times a day (BID) | RECTAL | Status: DC

## 2012-10-26 NOTE — ED Provider Notes (Signed)
CSN: [id number]     Arrival date & time 10/26/12  1952 History     First MD Initiated Contact with Patient 10/26/12 2010     No chief complaint on file.  (Consider location/radiation/quality/duration/timing/severity/associated sxs/prior Treatment) HPI Comments: Patient presents to the ER for evaluation of urinary retention. Patient reports that he has prostate cancer. He has not had any urine output today. He is scheduled for prostate surgery on August 27.  Reason also complains of a hemorrhoid. Reports that he has painful hemorrhoids that do better when he uses Preparation H. No rectal bleeding.   Past Medical History  Diagnosis Date  . Arthritis     left knee, left shoulder   Past Surgical History  Procedure Laterality Date  . Inguinal hernia repair     Family History  Problem Relation Age of Onset  . Heart disease Sister   . Stroke Sister   . Colon cancer Paternal Uncle   . Diabetes Neg Hx    History  Substance Use Topics  . Smoking status: Current Some Day Smoker -- 0.25 packs/day for 25 years    Types: Cigarettes  . Smokeless tobacco: Former Neurosurgeon     Comment: 1-2 cigarettes per week  . Alcohol Use: 3.6 oz/week    6 Cans of beer per week    Review of Systems  Allergies  Poison ivy extract  Home Medications   Current Outpatient Rx  Name  Route  Sig  Dispense  Refill  . diclofenac (VOLTAREN) 75 MG EC tablet      TAKE ONE TABLET BY MOUTH TWICE DAILY   60 tablet   1   . Na Sulfate-K Sulfate-Mg Sulf (SUPREP BOWEL PREP) SOLN   Oral   Take 1 kit by mouth once. suprep as directed. No substitutions   354 mL   0   . sulfamethoxazole-trimethoprim (BACTRIM DS,SEPTRA DS) 800-160 MG per tablet   Oral   Take 1 tablet by mouth 2 (two) times daily.   6 tablet   0   . vardenafil (LEVITRA) 5 MG tablet   Oral   Take 1 tablet (5 mg total) by mouth as needed for erectile dysfunction.   30 tablet   0    There were no vitals taken for this visit. Physical Exam   Constitutional: He is oriented to person, place, and time. He appears well-developed and well-nourished. No distress.  HENT:  Head: Normocephalic and atraumatic.  Right Ear: Hearing normal.  Left Ear: Hearing normal.  Nose: Nose normal.  Mouth/Throat: Oropharynx is clear and moist and mucous membranes are normal.  Eyes: Conjunctivae and EOM are normal. Pupils are equal, round, and reactive to light.  Neck: Normal range of motion. Neck supple.  Cardiovascular: Regular rhythm, S1 normal and S2 normal.  Exam reveals no gallop and no friction rub.   No murmur heard. Pulmonary/Chest: Effort normal and breath sounds normal. No respiratory distress. He exhibits no tenderness.  Abdominal: Soft. Normal appearance and bowel sounds are normal. There is no hepatosplenomegaly. There is tenderness in the suprapubic area. There is no rebound, no guarding, no tenderness at McBurney's point and negative Murphy's sign. No hernia.  Genitourinary: Rectal exam shows external hemorrhoid.  Musculoskeletal: Normal range of motion.  Neurological: He is alert and oriented to person, place, and time. He has normal strength. No cranial nerve deficit or sensory deficit. Coordination normal. GCS eye subscore is 4. GCS verbal subscore is 5. GCS motor subscore is 6.  Skin: Skin is  warm, dry and intact. No rash noted. No cyanosis.  Psychiatric: He has a normal mood and affect. His speech is normal and behavior is normal. Thought content normal.    ED Course   Procedures (including critical care time)  Labs Reviewed  CBC WITH DIFFERENTIAL  BASIC METABOLIC PANEL  URINALYSIS, ROUTINE W REFLEX MICROSCOPIC   No results found.  Diagnosis: 1. Urinary retention secondary to prostate cancer 2. External hemorrhoids  MDM  Presents with inability to pass any urine. Patient is having increased pain. His pain completely resolved after Foley catheter was placed in between to 700 mL of urine. Patient has scheduled followup with  his urologist in 2 days. The catheter will be left in place until followup.  Patient also complained of painful hemorrhoids. He does have 2 large external hemorrhoids that are nonthrombosed. He has been using Preparation H with some improvement. He'll be prescribed Anusol-HC cream.  Gilda Crease, MD 10/26/12 2112

## 2012-10-26 NOTE — ED Notes (Signed)
Pt state that he awoke this am bladder and rectal pain tried prep-H with little relief. Hx of Prostate Ca due for sx on 8/27

## 2012-10-28 DIAGNOSIS — M6281 Muscle weakness (generalized): Secondary | ICD-10-CM | POA: Diagnosis not present

## 2012-10-28 DIAGNOSIS — R279 Unspecified lack of coordination: Secondary | ICD-10-CM | POA: Diagnosis not present

## 2012-10-28 DIAGNOSIS — C61 Malignant neoplasm of prostate: Secondary | ICD-10-CM | POA: Diagnosis not present

## 2012-10-29 DIAGNOSIS — R339 Retention of urine, unspecified: Secondary | ICD-10-CM | POA: Diagnosis not present

## 2012-10-29 DIAGNOSIS — C61 Malignant neoplasm of prostate: Secondary | ICD-10-CM | POA: Diagnosis not present

## 2012-11-03 ENCOUNTER — Emergency Department (HOSPITAL_COMMUNITY)
Admission: EM | Admit: 2012-11-03 | Discharge: 2012-11-04 | Disposition: A | Discharge status: Home / Self Care | Payer: Medicare Other | Attending: Emergency Medicine | Admitting: Emergency Medicine

## 2012-11-03 DIAGNOSIS — Z79899 Other long term (current) drug therapy: Secondary | ICD-10-CM | POA: Diagnosis not present

## 2012-11-03 DIAGNOSIS — K644 Residual hemorrhoidal skin tags: Secondary | ICD-10-CM | POA: Diagnosis not present

## 2012-11-03 DIAGNOSIS — R339 Retention of urine, unspecified: Secondary | ICD-10-CM | POA: Insufficient documentation

## 2012-11-03 DIAGNOSIS — M1710 Unilateral primary osteoarthritis, unspecified knee: Secondary | ICD-10-CM | POA: Insufficient documentation

## 2012-11-03 DIAGNOSIS — F17200 Nicotine dependence, unspecified, uncomplicated: Secondary | ICD-10-CM | POA: Diagnosis not present

## 2012-11-03 DIAGNOSIS — Z8546 Personal history of malignant neoplasm of prostate: Secondary | ICD-10-CM | POA: Diagnosis not present

## 2012-11-03 DIAGNOSIS — R109 Unspecified abdominal pain: Secondary | ICD-10-CM | POA: Insufficient documentation

## 2012-11-03 DIAGNOSIS — M19019 Primary osteoarthritis, unspecified shoulder: Secondary | ICD-10-CM | POA: Insufficient documentation

## 2012-11-03 DIAGNOSIS — Z8739 Personal history of other diseases of the musculoskeletal system and connective tissue: Secondary | ICD-10-CM | POA: Insufficient documentation

## 2012-11-03 MED ORDER — ONDANSETRON 8 MG PO TBDP
8.0000 mg | ORAL_TABLET | Freq: Once | ORAL | Status: AC
  Administered 2012-11-04: 8 mg via ORAL
  Filled 2012-11-03: qty 1

## 2012-11-03 MED ORDER — OXYCODONE-ACETAMINOPHEN 5-325 MG PO TABS
2.0000 | ORAL_TABLET | Freq: Once | ORAL | Status: AC
  Administered 2012-11-04: 2 via ORAL
  Filled 2012-11-03: qty 2

## 2012-11-03 NOTE — ED Provider Notes (Signed)
CSN: [id number]     Arrival date & time 11/03/12  2156 History     First MD Initiated Contact with Patient 11/03/12 2237     Chief Complaint  Patient presents with  . Dysuria   (Consider location/radiation/quality/duration/timing/severity/associated sxs/prior Treatment) HPI Comments: Patient is a 67 year old male with history of urinary retention and prostate cancer he presents today with worsening urinary retention since yesterday. He states that yesterday he was only able to have a few dribbles. He has had problems like this in the past. He was seen in the emergency department 1 week ago on Saturday night and had a catheter placed. On Monday he saw the nurse practitioner at [facility] urology who removed his catheter. He has a surgery planned for 8/27 and his prostate. He also complains of hemorrhoids. He is taking preparation H and Anusol cream which has not been helping his pain.   Patient is a 67 y.o. male presenting with dysuria. The history is provided by the patient. No language interpreter was used.  Dysuria Associated symptoms include abdominal pain. Pertinent negatives include no chest pain, chills, fever, nausea or vomiting.    Past Medical History  Diagnosis Date  . Arthritis     left knee, left shoulder   Past Surgical History  Procedure Laterality Date  . Inguinal hernia repair     Family History  Problem Relation Age of Onset  . Heart disease Sister   . Stroke Sister   . Colon cancer Paternal Uncle   . Diabetes Neg Hx    History  Substance Use Topics  . Smoking status: Current Some Day Smoker -- 0.25 packs/day for 25 years    Types: Cigarettes  . Smokeless tobacco: Former Neurosurgeon     Comment: 1-2 cigarettes per week  . Alcohol Use: 3.6 oz/week    6 Cans of beer per week    Review of Systems  Constitutional: Negative for fever and chills.  Respiratory: Negative for shortness of breath.   Cardiovascular: Negative for chest pain.  Gastrointestinal: Positive for  abdominal pain. Negative for nausea and vomiting.  Genitourinary: Positive for dysuria.  All other systems reviewed and are negative.    Allergies  Poison ivy extract  Home Medications   Current Outpatient Rx  Name  Route  Sig  Dispense  Refill  . diclofenac (VOLTAREN) 75 MG EC tablet   Oral   Take 75 mg by mouth 2 (two) times daily as needed (for pain).         . hydrocortisone (ANUSOL-HC) 2.5 % rectal cream   Rectal   Place rectally 2 (two) times daily.   30 g   0    BP 162/90  Pulse 80  Temp(Src) 97.9 F (36.6 C) (Oral)  Resp 20  SpO2 100% Physical Exam  Nursing note and vitals reviewed. Constitutional: He is oriented to person, place, and time. He appears well-developed and well-nourished. No distress.  HENT:  Head: Normocephalic and atraumatic.  Right Ear: External ear normal.  Left Ear: External ear normal.  Nose: Nose normal.  Eyes: Conjunctivae are normal.  Neck: Normal range of motion. No tracheal deviation present.  Cardiovascular: Normal rate, regular rhythm and normal heart sounds.   Pulmonary/Chest: Effort normal and breath sounds normal. No stridor.  Abdominal: Soft. He exhibits no distension and no mass. There is tenderness in the suprapubic area. There is no rebound and no guarding. Hernia confirmed negative in the right inguinal area and confirmed negative in the left inguinal  area.  Genitourinary: Testes normal and penis normal. Rectal exam shows external hemorrhoid. Right testis shows no mass, no swelling and no tenderness. Right testis is descended. Cremasteric reflex is not absent on the right side. Left testis shows no mass, no swelling and no tenderness. Left testis is descended. Cremasteric reflex is not absent on the left side. Circumcised.  External hemorrhoid that is not thrombosed  Musculoskeletal: Normal range of motion.  Lymphadenopathy:       Right: No inguinal adenopathy present.       Left: No inguinal adenopathy present.   Neurological: He is alert and oriented to person, place, and time.  Skin: Skin is warm and dry. He is not diaphoretic.  Psychiatric: He has a normal mood and affect. His behavior is normal.    ED Course   Procedures (including critical care time)  Labs Reviewed  CBC WITH DIFFERENTIAL - Abnormal; Notable for the following:    HCT 38.9 (*)    All other components within normal limits  BASIC METABOLIC PANEL - Abnormal; Notable for the following:    Potassium 3.3 (*)    Glucose, Bld 112 (*)    All other components within normal limits  URINALYSIS, ROUTINE W REFLEX MICROSCOPIC - Abnormal; Notable for the following:    Color, Urine GREEN (*)    Hgb urine dipstick MODERATE (*)    All other components within normal limits  URINE MICROSCOPIC-ADD ON   No results found. 1. Urinary retention   2. External hemorrhoid     MDM  Patient to ED with urinary retention likely due to enlarged prostate. Hx of the same. Foley catheter inserted and 1100 ml of urine drained. Patient with significant relief. Labs are WNL. Follow up with his urologist on Monday. Catheter in place. Covered with Keflex due to multiple catheter placements. Pt to ER with external hemorrhoids. Anusol suppository given in ED. Vital signs stable. Return instructions given. Discussed case with Dr. Hyacinth Meeker who agrees with plan. Patient / Family / Caregiver informed of clinical course, understand medical decision-making process, and agree with plan.   Mora Bellman, PA-C 11/04/12 [phone]

## 2012-11-03 NOTE — ED Notes (Signed)
Pt arrived to ED via POV with a complaint of unable to urinate.  Pt has prostrate cancer and will have surgery for said on the 27thof this month.  Pt states he had a small dribble at aroud 0430 this am.  Pt states he has 10/10 abdominal pain

## 2012-11-04 LAB — CBC WITH DIFFERENTIAL/PLATELET
Eosinophils Relative: 0 % (ref 0–5)
HCT: 38.9 % — ABNORMAL LOW (ref 39.0–52.0)
Lymphocytes Relative: 32 % (ref 12–46)
Lymphs Abs: 3 10*3/uL (ref 0.7–4.0)
MCV: 88 fL (ref 78.0–100.0)
Monocytes Absolute: 0.6 10*3/uL (ref 0.1–1.0)
Monocytes Relative: 7 % (ref 3–12)
RBC: 4.42 MIL/uL (ref 4.22–5.81)
WBC: 9.5 10*3/uL (ref 4.0–10.5)

## 2012-11-04 LAB — URINALYSIS, ROUTINE W REFLEX MICROSCOPIC
Glucose, UA: NEGATIVE mg/dL
Ketones, ur: NEGATIVE mg/dL
Protein, ur: NEGATIVE mg/dL

## 2012-11-04 LAB — BASIC METABOLIC PANEL
CO2: 29 mEq/L (ref 19–32)
Calcium: 9.7 mg/dL (ref 8.4–10.5)
Creatinine, Ser: 0.77 mg/dL (ref 0.50–1.35)
Glucose, Bld: 112 mg/dL — ABNORMAL HIGH (ref 70–99)

## 2012-11-04 LAB — URINE MICROSCOPIC-ADD ON

## 2012-11-04 MED ORDER — CEPHALEXIN 500 MG PO CAPS: 500.0000 mg | ORAL_CAPSULE | Freq: Four times a day (QID) | ORAL | Status: DC

## 2012-11-04 MED ORDER — HYDROCORTISONE ACETATE 25 MG RE SUPP
25.0000 mg | Freq: Once | RECTAL | Status: AC
  Administered 2012-11-04: 25 mg via RECTAL
  Filled 2012-11-04: qty 1

## 2012-11-04 NOTE — ED Provider Notes (Signed)
Medical screening examination/treatment/procedure(s) were performed by non-physician practitioner and as supervising physician I was immediately available for consultation/collaboration.    Vida Roller, MD 11/04/12 [phone]

## 2012-11-05 DIAGNOSIS — C61 Malignant neoplasm of prostate: Secondary | ICD-10-CM | POA: Diagnosis not present

## 2012-11-05 DIAGNOSIS — R339 Retention of urine, unspecified: Secondary | ICD-10-CM | POA: Diagnosis not present

## 2012-11-07 DIAGNOSIS — R339 Retention of urine, unspecified: Secondary | ICD-10-CM | POA: Diagnosis not present

## 2012-11-12 ENCOUNTER — Encounter (HOSPITAL_COMMUNITY): Payer: Self-pay | Admitting: Pharmacy Technician

## 2012-11-14 ENCOUNTER — Other Ambulatory Visit (HOSPITAL_COMMUNITY): Payer: Self-pay | Admitting: Urology

## 2012-11-15 ENCOUNTER — Encounter (HOSPITAL_COMMUNITY): Payer: Self-pay

## 2012-11-15 ENCOUNTER — Encounter (HOSPITAL_COMMUNITY)
Admission: RE | Admit: 2012-11-15 | Discharge: 2012-11-15 | Disposition: A | Discharge status: Home / Self Care | Payer: Medicare Other | Source: Ambulatory Visit | Attending: Urology | Admitting: Urology

## 2012-11-15 DIAGNOSIS — Z01812 Encounter for preprocedural laboratory examination: Secondary | ICD-10-CM | POA: Diagnosis not present

## 2012-11-15 DIAGNOSIS — C61 Malignant neoplasm of prostate: Secondary | ICD-10-CM | POA: Insufficient documentation

## 2012-11-15 HISTORY — DX: Pain in unspecified joint: M25.50

## 2012-11-15 HISTORY — DX: Unspecified hemorrhoids: K64.9

## 2012-11-15 HISTORY — DX: Presence of other specified devices: Z97.8

## 2012-11-15 HISTORY — DX: Hematuria, unspecified: R31.9

## 2012-11-15 HISTORY — DX: Presence of urogenital implants: Z96.0

## 2012-11-15 HISTORY — DX: Personal history of other diseases of the digestive system: Z87.19

## 2012-11-15 HISTORY — DX: Personal history of urinary (tract) infections: Z87.440

## 2012-11-15 NOTE — Patient Instructions (Addendum)
20 Eric Osborn  11/15/2012   Your procedure is scheduled on: 11-20-2012  Report to [facility] at 630 AM.  Call this number if you have problems the morning of surgery [phone]   Remember:   Do not eat food or drink liquids :After Midnight.     Take these medicines the morning of surgery with A SIP OF WATER: hydrocodne if needed                                SEE [place] PREPARING FOR SURGERY SHEET   Do not wear jewelry, make-up or nail polish.  Do not wear lotions, powders, or perfumes. You may wear deodorant.   Men may shave face and neck.  Do not bring valuables to the hospital.  IS NOT RESPONSIBLE FOR VALUEABLES.  Contacts, dentures or bridgework may not be worn into surgery.  Leave suitcase in the car. After surgery it may be brought to your room.  For patients admitted to the hospital, checkout time is 11:00 AM the day of discharge.   Patients discharged the day of surgery will not be allowed to drive home.  Name and phone number of your driver:  Special Instructions: N/A   Please read over the following fact sheets that you were given: blood fact sheet  Call Cain Sieve RN pre op nurse if needed [phone]    FAILURE TO FOLLOW THESE INSTRUCTIONS MAY RESULT IN THE CANCELLATION OF YOUR SURGERY.  PATIENT SIGNATURE___________________________________________  NURSE SIGNATURE_____________________________________________

## 2012-11-15 NOTE — Progress Notes (Signed)
Cbc with dif, bmet 11-04-2012 epic

## 2012-11-19 NOTE — H&P (Signed)
Here today for lap rrp with bplnd. Patient has been in urinary retention.   Mr. Eric Osborn returns today to further discuss his recently diagnosed prostate cancer. Again, his PSA was not terribly elevated, but unfortunately his biopsy was more concerning with 12/12 cores positive. We obtained a CT Scan, which did show some sclerotic bone changes, but negative hot spots on bone scan, suggesting that that was from benign causes. No evidence of any pelvic lymphadenopathy or local extension. The bottom line is that there was no definitive evidence of cancer outside the prostate, although certainly he is high risk for microscopic metastatic disease/locally more advanced disease. He has read the information and also discussed things with several family members and friends. He is leaning towards a surgical approach. He has had no previous intraabdominal surgery. Current sexual functioning score is approximately 14. He is not currently sexually active. Prior AUA symptom score was 18/3. He has admitted to being quite anxious and nervous about things and has lost some weight over the last month or so.       History of Present Illness      Mr. Eric Osborn presents today to discuss his recent results of his prostate ultrasound and biopsy. He is currently 67 years of age. PSA was elevated at 8.0. Positive family history. Rectal exam revealed no worrisome induration or nodularity. Ultrasound revealed a prostate of approximately 50 g. AUA symptom score at that time original presentation was 18/3. Biopsies were unfortunate positive in all 12 out of 12 cores. All biopsy showed Gleason 7 disease. There were 2 course of 12 that showed a primary Gleason's 4+3 equals 7. One biopsy at the left base did suggest extraprostatic extension.   He has done well with biopsies and has no new complaints or concerns today. Again he does have significant erectile dysfunction. [facility] nomogram data: Chance of extracapsular extension of  approximately 30%, chance of seminal vesicle involvement 13% chance of lymph node involvement 3%.      Past Medical History Problems  1. History of  No Medical Problems  Surgical History Problems  1. History of  Hernia Repair  Current Meds 1. Diclofenac Sodium 75 MG Oral Tablet Delayed Release; Therapy: 01Apr2014 to  Allergies Medication  1. No Known Drug Allergies  Family History Problems  1. Maternal history of  Death In The Family Mother 73yrs 2. Paternal history of  Family Health Status - Father's Age 66yrs 3. Family history of  Family Health Status Number Of Children 1 son and 3 daughters  Social History Problems  1. Caffeine Use 1 qd 2. Marital History - Divorced V61.03 3. Occupation: Retired 4. Tobacco Use 305.1 1/2 ppd for 47yrs 5. Using Marijuana Denied  6. History of  Alcohol Use  Review of Systems Genitourinary, constitutional, skin, eye, otolaryngeal, hematologic/lymphatic, cardiovascular, pulmonary, endocrine, musculoskeletal, gastrointestinal, neurological and psychiatric system(s) were reviewed and pertinent findings if present are noted.  Genitourinary: difficulty starting the urinary stream, weak urinary stream and erectile dysfunction.  Constitutional: recent weight loss.  Musculoskeletal: back pain and joint pain.  Psychiatric: anxiety.    Physical Exam Constitutional: Well nourished and well developed . No acute distress.  ENT:. The ears and nose are normal in appearance.  Neck: The appearance of the neck is normal and no neck mass is present.  Pulmonary: No respiratory distress and normal respiratory rhythm and effort.  Cardiovascular: Heart rate and rhythm are normal . No peripheral edema.  Abdomen: The abdomen is soft and nontender. No masses are  palpated. No CVA tenderness. No hernias are palpable. No hepatosplenomegaly noted.  Rectal: Rectal exam demonstrates normal sphincter tone, no tenderness and no masses. Estimated prostate size  is 1+. The prostate has no nodularity and is not tender. The left seminal vesicle is nonpalpable. The right seminal vesicle is nonpalpable. The perineum is normal on inspection.  Skin: Normal skin turgor, no visible rash and no visible skin lesions.  Neuro/Psych:. Mood and affect are appropriate.    Assessment Assessed  1. Prostate Cancer 185  Plan Prostate Cancer (185)  1. Follow-up Schedule Surgery Office  Follow-up  Requested for: 07Jul2014  Discussion/Summary  The patient was counseled about the natural history of prostate cancer and the standard treatment options that are available for prostate cancer. It was explained to him how his age and life expectancy, clinical stage, Gleason score, and PSA affect his prognosis, the decision to proceed with additional staging studies, as well as how that information influences recommended treatment strategies. We discussed the roles for active surveillance, radiation therapy, surgical therapy, androgen deprivation, as well as ablative therapy options for the treatment of prostate cancer as appropriate to his individual cancer situation. We discussed the risks and benefits of these options with regard to their impact on cancer control and also in terms of potential adverse events, complications, and impact on quiality of life particularly related to urinary, bowel, and sexual function. The patient was encouraged to ask questions throughout the discussion today and all questions were answered to his stated satisfaction. In addition, the patient was provided with and/or directed to appropriate resources and literature for further education about prostate cancer and treatment options.   We discussed surgical therapy for prostate cancer including the different available surgical approaches. We discussed, in detail, the risks and expectations of surgery with regard to cancer control, urinary control, and erectile function as well as the expected postoperative  recovery process. The risks, potential complications/adverse events of radical prostatectomy as well as alternative options were explained to the patient.   We discussed surgical therapy for prostate cancer including the different available surgical approaches. We discussed, in detail, the risks and expectations of surgery with regard to cancer control, urinary control, and erectile function as well as the expected postoperative recovery process. Additional risks of surgery including but not limited to bleeding, infection, hernia formation, nerve damage, lymphocele formation, bowel/rectal injury potentially necessitating colostomy, damage to the urinary tract resulting in urine leakage, urethral stricture, and the cardiopulmonary risks such as myocardial infarction, stroke, death, venothromboembolism, etc. were explained. The risk of open surgical conversion for robotic/laparoscopic prostatectomy was also discussed.   40 minutes were spent in face to face consultation with patient today.    Amendment  I really think given Mr. Corine Shelter situation that I feel his best option is proceeding with an attempt at robotic laparoscopic prostatectomy with bilateral and pelvic lymph node resection. He understands that there is reasonably high likelihood of the disease outside the prostate, given the volume of cancer noted on biopsy. Removal of the prostate will give [place] substantial pathologic information and determine whether he needs any other treatment status post his surgery. We did discuss issues with regard to incontinence and erectile dysfunction. I would recommend preoperative physical therapy along with attending the preoperative classes to learn more about the surgical approach and what to expect. His other option would be external beam radiation therapy or potential external beam radiation therapy plus seed implantation. Both of those would be benefited with 6-24 months of  hormonal therapy. My concern with  radiation is that he already has considerable irritative voiding symptoms and is likely to have a significant problem with voiding dysfunction. Again, I do believe that surgery would have potentially a higher likelihood of cure and certainly provide additional pathologic information that would be beneficial. He would not be a candidate for nerve spare option for his surgery and he is very likely to have negative impact on sexual functioning.   cc: [facility]

## 2012-11-20 ENCOUNTER — Inpatient Hospital Stay (HOSPITAL_COMMUNITY)
Admission: RE | Admit: 2012-11-20 | Discharge: 2012-11-22 | DRG: 708 | Disposition: A | Discharge status: Home / Self Care | Payer: Medicare Other | Source: Ambulatory Visit | Attending: Urology | Admitting: Urology

## 2012-11-20 ENCOUNTER — Encounter (HOSPITAL_COMMUNITY): Payer: Self-pay | Admitting: Anesthesiology

## 2012-11-20 ENCOUNTER — Encounter (HOSPITAL_COMMUNITY): Payer: Self-pay | Admitting: *Deleted

## 2012-11-20 ENCOUNTER — Encounter (HOSPITAL_COMMUNITY)
Admission: RE | Disposition: A | Discharge status: Home / Self Care | Payer: Self-pay | Source: Ambulatory Visit | Attending: Urology

## 2012-11-20 ENCOUNTER — Ambulatory Visit (HOSPITAL_COMMUNITY): Payer: Medicare Other | Admitting: Anesthesiology

## 2012-11-20 DIAGNOSIS — R339 Retention of urine, unspecified: Secondary | ICD-10-CM | POA: Diagnosis not present

## 2012-11-20 DIAGNOSIS — Z01812 Encounter for preprocedural laboratory examination: Secondary | ICD-10-CM

## 2012-11-20 DIAGNOSIS — C61 Malignant neoplasm of prostate: Principal | ICD-10-CM | POA: Diagnosis present

## 2012-11-20 DIAGNOSIS — K449 Diaphragmatic hernia without obstruction or gangrene: Secondary | ICD-10-CM | POA: Diagnosis present

## 2012-11-20 DIAGNOSIS — R634 Abnormal weight loss: Secondary | ICD-10-CM | POA: Diagnosis present

## 2012-11-20 DIAGNOSIS — F17200 Nicotine dependence, unspecified, uncomplicated: Secondary | ICD-10-CM | POA: Diagnosis present

## 2012-11-20 DIAGNOSIS — N529 Male erectile dysfunction, unspecified: Secondary | ICD-10-CM | POA: Diagnosis present

## 2012-11-20 DIAGNOSIS — F411 Generalized anxiety disorder: Secondary | ICD-10-CM | POA: Diagnosis present

## 2012-11-20 HISTORY — PX: ROBOT ASSISTED LAPAROSCOPIC RADICAL PROSTATECTOMY: SHX5141

## 2012-11-20 HISTORY — PX: LYMPHADENECTOMY: SHX5960

## 2012-11-20 LAB — CBC
HCT: 33.1 % — ABNORMAL LOW (ref 39.0–52.0)
Hemoglobin: 10.8 g/dL — ABNORMAL LOW (ref 13.0–17.0)
MCH: 29.3 pg (ref 26.0–34.0)
MCHC: 32.6 g/dL (ref 30.0–36.0)
RDW: 15.2 % (ref 11.5–15.5)

## 2012-11-20 LAB — HEMOGLOBIN AND HEMATOCRIT, BLOOD
HCT: 37.7 % — ABNORMAL LOW (ref 39.0–52.0)
Hemoglobin: 12.5 g/dL — ABNORMAL LOW (ref 13.0–17.0)

## 2012-11-20 LAB — TYPE AND SCREEN
ABO/RH(D): O POS
Antibody Screen: NEGATIVE

## 2012-11-20 SURGERY — ROBOTIC ASSISTED LAPAROSCOPIC RADICAL PROSTATECTOMY
Anesthesia: General | Wound class: Clean Contaminated

## 2012-11-20 MED ORDER — MIDAZOLAM HCL 5 MG/5ML IJ SOLN
INTRAMUSCULAR | Status: DC | PRN
  Administered 2012-11-20: 2 mg via INTRAVENOUS

## 2012-11-20 MED ORDER — PROMETHAZINE HCL 25 MG/ML IJ SOLN: 6.2500 mg | INTRAMUSCULAR | Status: DC | PRN

## 2012-11-20 MED ORDER — GENTAMICIN SULFATE 40 MG/ML IJ SOLN
390.0000 mg | Freq: Once | INTRAVENOUS | Status: AC
  Administered 2012-11-21: 390 mg via INTRAVENOUS
  Filled 2012-11-20: qty 9.75

## 2012-11-20 MED ORDER — ONDANSETRON HCL 4 MG/2ML IJ SOLN
INTRAMUSCULAR | Status: DC | PRN
  Administered 2012-11-20: 4 mg via INTRAVENOUS

## 2012-11-20 MED ORDER — LACTATED RINGERS IV SOLN: INTRAVENOUS | Status: DC

## 2012-11-20 MED ORDER — CISATRACURIUM BESYLATE (PF) 10 MG/5ML IV SOLN
INTRAVENOUS | Status: DC | PRN
  Administered 2012-11-20: 2 mg via INTRAVENOUS
  Administered 2012-11-20 (×2): 4 mg via INTRAVENOUS
  Administered 2012-11-20: 7 mg via INTRAVENOUS

## 2012-11-20 MED ORDER — SODIUM CHLORIDE 0.9 % IR SOLN
Status: DC | PRN
  Administered 2012-11-20: 1000 mL via INTRAVESICAL

## 2012-11-20 MED ORDER — EPHEDRINE SULFATE 50 MG/ML IJ SOLN
INTRAMUSCULAR | Status: DC | PRN
  Administered 2012-11-20 (×2): 7.5 mg via INTRAVENOUS

## 2012-11-20 MED ORDER — INDIGOTINDISULFONATE SODIUM 8 MG/ML IJ SOLN
INTRAMUSCULAR | Status: AC
  Filled 2012-11-20: qty 10

## 2012-11-20 MED ORDER — CEFAZOLIN SODIUM-DEXTROSE 2-3 GM-% IV SOLR
2.0000 g | INTRAVENOUS | Status: AC
  Administered 2012-11-20: 2 g via INTRAVENOUS

## 2012-11-20 MED ORDER — KETOROLAC TROMETHAMINE 30 MG/ML IJ SOLN
30.0000 mg | Freq: Four times a day (QID) | INTRAMUSCULAR | Status: DC
  Administered 2012-11-20 – 2012-11-21 (×4): 30 mg via INTRAVENOUS
  Filled 2012-11-20 (×4): qty 1

## 2012-11-20 MED ORDER — INDIGOTINDISULFONATE SODIUM 8 MG/ML IJ SOLN
INTRAMUSCULAR | Status: DC | PRN
  Administered 2012-11-20 (×2): 5 mL via INTRAVENOUS

## 2012-11-20 MED ORDER — PROPOFOL 10 MG/ML IV BOLUS
INTRAVENOUS | Status: DC | PRN
  Administered 2012-11-20: 160 mg via INTRAVENOUS

## 2012-11-20 MED ORDER — SODIUM CHLORIDE 0.9 % IV BOLUS (SEPSIS)
1000.0000 mL | Freq: Once | INTRAVENOUS | Status: AC
  Administered 2012-11-20: 1000 mL via INTRAVENOUS

## 2012-11-20 MED ORDER — FENTANYL CITRATE 0.05 MG/ML IJ SOLN
INTRAMUSCULAR | Status: DC | PRN
  Administered 2012-11-20 (×2): 50 ug via INTRAVENOUS
  Administered 2012-11-20: 100 ug via INTRAVENOUS

## 2012-11-20 MED ORDER — DEXAMETHASONE SODIUM PHOSPHATE 10 MG/ML IJ SOLN
INTRAMUSCULAR | Status: DC | PRN
  Administered 2012-11-20: 10 mg via INTRAVENOUS

## 2012-11-20 MED ORDER — DEXTROSE-NACL 5-0.45 % IV SOLN
INTRAVENOUS | Status: DC
  Administered 2012-11-20 – 2012-11-21 (×3): via INTRAVENOUS

## 2012-11-20 MED ORDER — STERILE WATER FOR IRRIGATION IR SOLN
Status: DC | PRN
  Administered 2012-11-20: 3000 mL

## 2012-11-20 MED ORDER — SUCCINYLCHOLINE CHLORIDE 20 MG/ML IJ SOLN
INTRAMUSCULAR | Status: DC | PRN
  Administered 2012-11-20: 100 mg via INTRAVENOUS

## 2012-11-20 MED ORDER — CEFAZOLIN SODIUM-DEXTROSE 2-3 GM-% IV SOLR
2.0000 g | Freq: Once | INTRAVENOUS | Status: AC
  Administered 2012-11-20: 2 g via INTRAVENOUS
  Filled 2012-11-20 (×2): qty 50

## 2012-11-20 MED ORDER — HYDROMORPHONE HCL PF 1 MG/ML IJ SOLN
0.2500 mg | INTRAMUSCULAR | Status: DC | PRN
  Administered 2012-11-20 (×2): 0.5 mg via INTRAVENOUS

## 2012-11-20 MED ORDER — SODIUM CHLORIDE 0.9 % IV SOLN: INTRAVENOUS | Status: DC

## 2012-11-20 MED ORDER — SODIUM CHLORIDE 0.9 % IV SOLN
INTRAVENOUS | Status: DC | PRN
  Administered 2012-11-20 (×2): via INTRAVENOUS

## 2012-11-20 MED ORDER — MEPERIDINE HCL 50 MG/ML IJ SOLN: 6.2500 mg | INTRAMUSCULAR | Status: DC | PRN

## 2012-11-20 MED ORDER — GLYCOPYRROLATE 0.2 MG/ML IJ SOLN
INTRAMUSCULAR | Status: DC | PRN
  Administered 2012-11-20: 0.6 mg via INTRAVENOUS

## 2012-11-20 MED ORDER — CEFAZOLIN SODIUM-DEXTROSE 2-3 GM-% IV SOLR
INTRAVENOUS | Status: AC
  Filled 2012-11-20: qty 50

## 2012-11-20 MED ORDER — CIPROFLOXACIN HCL 500 MG PO TABS: 500.0000 mg | ORAL_TABLET | Freq: Two times a day (BID) | ORAL | Status: DC

## 2012-11-20 MED ORDER — MORPHINE SULFATE 2 MG/ML IJ SOLN: 2.0000 mg | INTRAMUSCULAR | Status: DC | PRN

## 2012-11-20 MED ORDER — HYDROCODONE-ACETAMINOPHEN 5-325 MG PO TABS
1.0000 | ORAL_TABLET | ORAL | Status: DC | PRN
  Administered 2012-11-20 – 2012-11-22 (×8): 2 via ORAL
  Filled 2012-11-20 (×8): qty 2

## 2012-11-20 MED ORDER — BUPIVACAINE-EPINEPHRINE PF 0.25-1:200000 % IJ SOLN
INTRAMUSCULAR | Status: AC
  Filled 2012-11-20: qty 30

## 2012-11-20 MED ORDER — DEXTROSE 5 % IV SOLN
390.0000 mg | INTRAVENOUS | Status: AC
  Administered 2012-11-20: 390 mg via INTRAVENOUS
  Filled 2012-11-20: qty 9.75

## 2012-11-20 MED ORDER — ACETAMINOPHEN 325 MG PO TABS: 650.0000 mg | ORAL_TABLET | ORAL | Status: DC | PRN

## 2012-11-20 MED ORDER — HYDROMORPHONE HCL PF 1 MG/ML IJ SOLN
INTRAMUSCULAR | Status: AC
  Filled 2012-11-20: qty 1

## 2012-11-20 MED ORDER — HYDROCODONE-ACETAMINOPHEN 5-325 MG PO TABS: 1.0000 | ORAL_TABLET | Freq: Four times a day (QID) | ORAL | Status: DC | PRN

## 2012-11-20 MED ORDER — LACTATED RINGERS IV SOLN
INTRAVENOUS | Status: DC | PRN
  Administered 2012-11-20: 08:00:00

## 2012-11-20 MED ORDER — KETOROLAC TROMETHAMINE 30 MG/ML IJ SOLN
INTRAMUSCULAR | Status: AC
  Filled 2012-11-20: qty 1

## 2012-11-20 MED ORDER — HEPARIN SODIUM (PORCINE) 1000 UNIT/ML IJ SOLN
INTRAMUSCULAR | Status: AC
  Filled 2012-11-20: qty 1

## 2012-11-20 MED ORDER — BUPIVACAINE-EPINEPHRINE 0.25% -1:200000 IJ SOLN
INTRAMUSCULAR | Status: DC | PRN
  Administered 2012-11-20: 26 mL

## 2012-11-20 MED ORDER — LACTATED RINGERS IV SOLN
INTRAVENOUS | Status: DC | PRN
  Administered 2012-11-20: 08:00:00 via INTRAVENOUS

## 2012-11-20 MED ORDER — NEOSTIGMINE METHYLSULFATE 1 MG/ML IJ SOLN
INTRAMUSCULAR | Status: DC | PRN
  Administered 2012-11-20: 4 mg via INTRAVENOUS

## 2012-11-20 SURGICAL SUPPLY — 46 items
CANISTER SUCTION 2500CC (MISCELLANEOUS) ×3 IMPLANT
CATH FOLEY 2WAY SLVR 18FR 30CC (CATHETERS) ×3 IMPLANT
CATH ROBINSON RED A/P 16FR (CATHETERS) ×3 IMPLANT
CATH ROBINSON RED A/P 8FR (CATHETERS) ×3 IMPLANT
CATH TIEMANN FOLEY 18FR 5CC (CATHETERS) ×3 IMPLANT
CHLORAPREP W/TINT 26ML (MISCELLANEOUS) ×3 IMPLANT
CLIP LIGATING HEM O LOK PURPLE (MISCELLANEOUS) ×6 IMPLANT
CLOTH BEACON ORANGE TIMEOUT ST (SAFETY) ×3 IMPLANT
CORD HIGH FREQUENCY UNIPOLAR (ELECTROSURGICAL) ×3 IMPLANT
COVER SURGICAL LIGHT HANDLE (MISCELLANEOUS) ×3 IMPLANT
COVER TIP SHEARS 8 DVNC (MISCELLANEOUS) ×2 IMPLANT
COVER TIP SHEARS 8MM DA VINCI (MISCELLANEOUS) ×1
CUTTER ECHEON FLEX ENDO 45 340 (ENDOMECHANICALS) ×3 IMPLANT
DECANTER SPIKE VIAL GLASS SM (MISCELLANEOUS) ×3 IMPLANT
DRAPE SURG IRRIG POUCH 19X23 (DRAPES) ×3 IMPLANT
DRSG TEGADERM 2-3/8X2-3/4 SM (GAUZE/BANDAGES/DRESSINGS) ×12 IMPLANT
DRSG TEGADERM 4X4.75 (GAUZE/BANDAGES/DRESSINGS) ×6 IMPLANT
DRSG TEGADERM 6X8 (GAUZE/BANDAGES/DRESSINGS) ×6 IMPLANT
ELECT REM PT RETURN 9FT ADLT (ELECTROSURGICAL) ×3
ELECTRODE REM PT RTRN 9FT ADLT (ELECTROSURGICAL) ×2 IMPLANT
GAUZE SPONGE 2X2 8PLY STRL LF (GAUZE/BANDAGES/DRESSINGS) ×2 IMPLANT
GLOVE BIO SURGEON STRL SZ 6.5 (GLOVE) ×3 IMPLANT
GLOVE BIOGEL M STRL SZ7.5 (GLOVE) ×6 IMPLANT
GOWN PREVENTION PLUS XLARGE (GOWN DISPOSABLE) ×3 IMPLANT
GOWN STRL NON-REIN LRG LVL3 (GOWN DISPOSABLE) ×3 IMPLANT
GOWN STRL REIN XL XLG (GOWN DISPOSABLE) ×6 IMPLANT
HOLDER FOLEY CATH W/STRAP (MISCELLANEOUS) ×3 IMPLANT
IV LACTATED RINGERS 1000ML (IV SOLUTION) ×3 IMPLANT
KIT ACCESSORY DA VINCI DISP (KITS) ×1
KIT ACCESSORY DVNC DISP (KITS) ×2 IMPLANT
NDL SAFETY ECLIPSE 18X1.5 (NEEDLE) ×2 IMPLANT
NEEDLE HYPO 18GX1.5 SHARP (NEEDLE) ×1
PACK ROBOT UROLOGY CUSTOM (CUSTOM PROCEDURE TRAY) ×3 IMPLANT
RELOAD GREEN ECHELON 45 (STAPLE) ×3 IMPLANT
SEALER TISSUE G2 CVD JAW 45CM (ENDOMECHANICALS) ×6 IMPLANT
SET TUBE IRRIG SUCTION NO TIP (IRRIGATION / IRRIGATOR) ×3 IMPLANT
SOLUTION ELECTROLUBE (MISCELLANEOUS) ×3 IMPLANT
SPONGE GAUZE 2X2 STER 10/PKG (GAUZE/BANDAGES/DRESSINGS) ×1
SUT ETHILON 3 0 PS 1 (SUTURE) ×3 IMPLANT
SUT VIC AB 2-0 SH 27 (SUTURE) ×3
SUT VIC AB 2-0 SH 27X BRD (SUTURE) ×6 IMPLANT
SUT VICRYL 0 UR6 27IN ABS (SUTURE) ×3 IMPLANT
SYR 27GX1/2 1ML LL SAFETY (SYRINGE) ×3 IMPLANT
TOWEL OR 17X26 10 PK STRL BLUE (TOWEL DISPOSABLE) ×3 IMPLANT
TOWEL OR NON WOVEN STRL DISP B (DISPOSABLE) ×3 IMPLANT
WATER STERILE IRR 1500ML POUR (IV SOLUTION) ×6 IMPLANT

## 2012-11-20 NOTE — Progress Notes (Signed)
Diane, NP returned call for the urology group and asked RN to call Dr. Sherron Monday. Dr. Sherron Monday returned call and orders received. Will cont to monitor patient.

## 2012-11-20 NOTE — Transfer of Care (Signed)
Immediate Anesthesia Transfer of Care Note  Patient: BENEN WEIDA  Procedure(s) Performed: Procedure(s): ROBOTIC ASSISTED LAPAROSCOPIC RADICAL PROSTATECTOMY (N/A) WITH BILATERAL PELVIC LYMPH NODE DISSECTION  (Bilateral)  Patient Location: PACU  Anesthesia Type:General  Level of Consciousness: awake, sedated and patient cooperative  Airway & Oxygen Therapy: Patient Spontanous Breathing and Patient connected to face mask oxygen  Post-op Assessment: Report given to PACU RN and Post -op Vital signs reviewed and stable  Post vital signs: Reviewed and stable  Complications: No apparent anesthesia complications

## 2012-11-20 NOTE — Progress Notes (Signed)
ANTIBIOTIC CONSULT NOTE - INITIAL  Pharmacy Consult for gentamicin Indication: pre-op prostatectomy with indwelling catheter  Allergies  Allergen Reactions  . Poison Ivy Extract [Extract Of Poison Ivy] Nausea And Vomiting and Rash    Patient Measurements: Height: 5\' 11"  (180.3 cm) Weight: 170 lb (77.111 kg) IBW/kg (Calculated) : 75.3 Actual Body Weight: 77.1kg  Vital Signs: Temp: 97.9 F (36.6 C) (08/27 0617) Temp src: Oral (08/27 0617) BP: 108/76 mmHg (08/27 0617) Pulse Rate: 79 (08/27 0617)     Labs: Estimated Creatinine Clearance: 95.4 ml/min (by C-G formula based on Cr of 0.77).  Microbiology: No results found for this or any previous visit (from the past 720 hour(s)).  Medical History: Past Medical History  Diagnosis Date  . Arthritis     left knee, left shoulder  . Hematuria     occasional  . History of bladder infections   . Joint pain   . H/O hiatal hernia   . Hemorrhoid   . Foley catheter in place     Assessment: 42 yoM for prostatectomy. Pharmacy was consulted to dose pro-op gentamicin.  Patient's TBW = 77.1kg which is within 20% of IBW=75.3kg so actual body weight will be used for dose calculation  SCr WNL at 0.77 on 8/11  Gentamicin dose will be 5mg /kg per protocol  Patient will also receive cefazolin 2g IV pre-op  Goal of Therapy:  prevention of infection  Plan:  - gentamicin 390mg  IV x 1 today pre-op - f/u after surgery  Thank you for the consult.  Tomi Bamberger, PharmD Clinical Pharmacist Pager: [phone] Pharmacy: [phone] 11/20/2012 8:36 AM

## 2012-11-20 NOTE — Op Note (Signed)
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 Endotracheal  Indications: Patient was diagnosed with higher risk clinical stage TIc Adenocarcinoma the prostate. He underwent extensive consultation with regard to treatment options. The patient decided on a surgical approach. He appeared to understand the distinct advantages as well as the disadvantages of this procedure. The patient has performed a mechanical bowel prep. He has had placement of PAS compression boots and has received perioperative antibiotics. The patient's preoperative PSA was 9. Ultrasound revealed a 50 g  prostate.  12/ 12 cores were positive with evidence of extra-capsular extension. Patient has also had recent ongoing urinary retention Technique and findings:The patient was brought to the operating room and had successful induction of general endotracheal anesthesia.the patient was placed in a low lithotomy position with careful padding of all extremities. He was secured to the operative table and placed in the steep Trendelenburg position. He was prepped and draped in usual manner. A Foley catheter was placed sterilely on the field. Camera port site was chosen 18 cm above the pubic symphysis just to the left of the umbilicus. A standard open Hassan technique was utilized. A 12 mm trocar was placed without difficulty. The camera was then inserted and no abnormalities were noted within the pelvis. The trochars were placed with direct visual guidance. This included 3 8mm robotic trochars and a 12 mm and 5 mm assist ports. Once all the ports were placed the robot was docked. The bladder was filled and the space of Retzius was developed with electrocautery dissection as well as blunt dissection.  Superficial fat off the endopelvic fascia and bladder neck was removed with electrocautery scissors. The endopelvic fascia was then incised bilaterally from base to apex. Levator musculature was swept off the apex of the prostate isolating the dorsal venous complex which was then stapled with the ETS stapling device. The anterior bladder neck was identified with the aid of the Foley balloon. This was then transected down to the Foley catheter with electrocautery scissors. The Foley catheter was then retracted anteriorly. Indigo carmine was given and we appeared to be away from the ureteral orifices. The posterior bladder neck was then transected and the dissection carried down to the adnexal structures. The normal tissue plans were obliterated and the  Seminal vesicles were extremely stuck suggesting a high probability of cancer extension outside the gland. I ended up going posteriorly following the vas bilaterally to the seminal vesicles. The posterior plane between the rectum and prostate was then established with considerable difficuly primarily with blunt dissection. The prostate was focally stuck to the rectum.   The patient was not felt to be a candidate for  nerve sparing. With the prostate retracted anteriorly the vascular pedicles of the prostate were taken widely with the Enseal device. The Foley catheter was then reinserted and the anterior urethra was transected. The posterior urethra was then transected as were some rectourethralis fibers. The prostate was then removed from the pelvis. The pelvis was then copiously irrigated. Rectal insufflation was performed and there was no evidence of rectal injury.  Attention was then turned towards bilateral pelvic lymph node dissection. The obturator node packets were removed I laterally and the dissection extended towards the bifurcation of the iliac artery. The obturator nerve was identified on both sides and preserved. Hemalock clips were used for small  veins and lymphatic channels. The node packets were sent for permanent analysis.  Attention was then turned towards reconstruction. The bladder neck did require lateral reconstruction To decrease the size. Laterally we used figure of 8 sutures of 2-0 vicryl. The bladder neck and posterior urethra were reapproximated at the 6:00 position utilizing a 2-0 Vicryl suture. The rest of the anastomosis was done with a double-armed 3-0 Monocryl suture in a 360 degree manner. Additional indigo carmine was given. A new catheter was placed and bladder irrigation revealed no evidence of leakage. A Blake drain was placed through one of the robotic trochars and positioned in the retropubic space above the anastomosis. This was then secured to the skin with a nylon suture. The prostate was placed in the Endopouch retrieval bag. The 12 mm trocar site was closed with a Vicryl suture with the aid of a suture passer. Our other trochars were taken out with direct visual guidance without evidence of any bleeding. The camera port incision was extended slightly to allow for removal of the specimen and then closed with a running Vicryl suture. All port sites were infiltrated with Marcaine and then closed with surgical clips. The patient was then taken to recovery room having had no obvious complications or problems. Sponge and needle counts were correct.

## 2012-11-20 NOTE — Progress Notes (Signed)
Utilization review completed.  

## 2012-11-20 NOTE — Progress Notes (Signed)
ANTIBIOTIC CONSULT NOTE - follow-up  Pharmacy Consult for gentamicin Indication: post-op x 24hrs  Allergies  Allergen Reactions  . Poison Ivy Extract [Extract Of Poison Ivy] Nausea And Vomiting and Rash    Patient Measurements: Height: 5\' 11"  (180.3 cm) Weight: 170 lb (77.111 kg) IBW/kg (Calculated) : 75.3 Actual Body Weight: 77.1kg  Vital Signs: Temp: 97.6 F (36.4 C) (08/27 1346) Temp src: Oral (08/27 0617) BP: 130/67 mmHg (08/27 1346) Pulse Rate: 86 (08/27 1346)  Total I/O In: 3250 [I.V.:2250; IV Piggyback:1000] Out: 520 [Urine:200; Drains:120; Blood:200]  Labs: Estimated Creatinine Clearance: 95.4 ml/min (by C-G formula based on Cr of 0.77).  Microbiology: No results found for this or any previous visit (from the past 720 hour(s)).  Medical History: Past Medical History  Diagnosis Date  . Arthritis     left knee, left shoulder  . Hematuria     occasional  . History of bladder infections   . Joint pain   . H/O hiatal hernia   . Hemorrhoid   . Foley catheter in place     Assessment: 1 yoM for prostatectomy. Pharmacy was consulted to dose pro-op gentamicin.  Patient's TBW = 77.1kg which is within 20% of IBW=75.3kg so actual body weight will be used for dose calculation  SCr WNL at 0.77 on 8/11  Spoke with Dr. Isabel Caprice in OR, clarified gentamicin to continue 24hrs post-op  Goal of Therapy:  prevention of infection  Plan:  - gentamicin 390mg  IV x 1 tomorrow at 8am to complete 24h of abx post-op - No levels needed - Cefazolin ordered post-op as well, dose OK  Juliette Alcide, PharmD, BCPS.   Pager: [phone] 11/20/2012 2:51 PM

## 2012-11-20 NOTE — Progress Notes (Addendum)
Pt's jp has a large amount of bloody drainage coming from around it. Dressing saturated and dressing reinforced with 4x4s. Will cont to monitor patient. Call was placed to the answering service, awaiting call back.

## 2012-11-20 NOTE — Progress Notes (Signed)
Patient ID: Eric Osborn, male   DOB: 03/18/1946, 67 y.o.   MRN: [id number] Post-op note  Subjective: The patient is doing well.  No complaints.  Denies N/V.  Has not ambulated yet.  Objective: Vital signs in last 24 hours: Temp:  [97 F (36.1 C)-97.9 F (36.6 C)] 97.6 F (36.4 C) (08/27 1346) Pulse Rate:  [76-86] 86 (08/27 1346) Resp:  [12-18] 15 (08/27 1346) BP: (108-134)/(67-78) 130/67 mmHg (08/27 1346) SpO2:  [100 %] 100 % (08/27 1346) Weight:  [77.111 kg (170 lb)] 77.111 kg (170 lb) (08/27 0700)  Intake/Output from previous day:   Intake/Output this shift: Total I/O In: 3250 [I.V.:2250; IV Piggyback:1000] Out: 970 [Urine:575; Drains:195; Blood:200]  Physical Exam:  General: Alert and oriented. Abdomen: Soft, Nondistended. Incisions: Clean and dry.  Lab Results:  Recent Labs  11/20/12 1243  HGB 12.5*  HCT 37.7*    Assessment/Plan: POD#0   1) Continue to monitor 2) DVT prophy, clears, IS, amb, pain control     LOS: 0 days   Silas Flood. 11/20/2012, 4:46 PM

## 2012-11-20 NOTE — Anesthesia Preprocedure Evaluation (Addendum)
Anesthesia Evaluation  Patient identified by MRN, date of birth, ID band Patient awake    Reviewed: Allergy & Precautions, H&P , NPO status , Patient's Chart, lab work & pertinent test results  Airway Mallampati: II TM Distance: >3 FB Neck ROM: Full    Dental no notable dental hx. (+) Upper Dentures and Missing   Pulmonary Current Smoker,  breath sounds clear to auscultation  Pulmonary exam normal       Cardiovascular negative cardio ROS  Rhythm:Regular Rate:Normal     Neuro/Psych negative neurological ROS  negative psych ROS   GI/Hepatic Neg liver ROS, hiatal hernia,   Endo/Other  negative endocrine ROS  Renal/GU negative Renal ROS  negative genitourinary   Musculoskeletal negative musculoskeletal ROS (+)   Abdominal   Peds negative pediatric ROS (+)  Hematology negative hematology ROS (+)   Anesthesia Other Findings   Reproductive/Obstetrics negative OB ROS                          Anesthesia Physical Anesthesia Plan  ASA: II  Anesthesia Plan: General   Post-op Pain Management:    Induction: Intravenous  Airway Management Planned: Oral ETT  Additional Equipment:   Intra-op Plan:   Post-operative Plan: Extubation in OR  Informed Consent: I have reviewed the patients History and Physical, chart, labs and discussed the procedure including the risks, benefits and alternatives for the proposed anesthesia with the patient or authorized representative who has indicated his/her understanding and acceptance.   Dental advisory given  Plan Discussed with: CRNA  Anesthesia Plan Comments:         Anesthesia Quick Evaluation

## 2012-11-20 NOTE — Care Management Note (Addendum)
    Page 1 of 1   11/22/2012     12:55:01 PM   CARE MANAGEMENT NOTE 11/22/2012  Patient:  Eric Osborn, Eric Osborn   Account Number:  [id number]  Date Initiated:  11/20/2012  Documentation initiated by:  Lanier Clam  Subjective/Objective Assessment:   67 Y/O M ADMITTED W/PROSTATE CA.     Action/Plan:   FROM HOME W/SPOUSE.   Anticipated DC Date:  11/22/2012   Anticipated DC Plan:  HOME/SELF CARE      DC Planning Services  CM consult      Choice offered to / List presented to:             Status of service:  Completed, signed off Medicare Important Message given?   (If response is "NO", the following Medicare IM given date fields will be blank) Date Medicare IM given:   Date Additional Medicare IM given:    Discharge Disposition:  HOME/SELF CARE  Per UR Regulation:  Reviewed for med. necessity/level of care/duration of stay  If discussed at Long Length of Stay Meetings, dates discussed:    Comments:  11/22/12 Oneal Biglow RN,BSN NCM [phone] POD#2,AMBULATE.NO D/C NEEDS ANTICIPATED.  11/20/12 Lowen Barringer RN,BSN NCM [phone] S/P L RADICAL PROSTATECTOMY.

## 2012-11-20 NOTE — Anesthesia Postprocedure Evaluation (Signed)
  Anesthesia Post-op Note  Patient: Eric Osborn  Procedure(s) Performed: Procedure(s) (LRB): ROBOTIC ASSISTED LAPAROSCOPIC RADICAL PROSTATECTOMY (N/A) WITH BILATERAL PELVIC LYMPH NODE DISSECTION  (Bilateral)  Patient Location: PACU  Anesthesia Type: General  Level of Consciousness: awake and alert   Airway and Oxygen Therapy: Patient Spontanous Breathing  Post-op Pain: mild  Post-op Assessment: Post-op Vital signs reviewed, Patient's Cardiovascular Status Stable, Respiratory Function Stable, Patent Airway and No signs of Nausea or vomiting  Last Vitals:  Filed Vitals:   11/20/12 1300  BP: 128/75  Pulse: 76  Temp:   Resp: 12    Post-op Vital Signs: stable   Complications: No apparent anesthesia complications

## 2012-11-21 ENCOUNTER — Encounter (HOSPITAL_COMMUNITY): Payer: Self-pay | Admitting: Urology

## 2012-11-21 LAB — CBC
MCH: 29.6 pg (ref 26.0–34.0)
MCHC: 32.8 g/dL (ref 30.0–36.0)
MCV: 90.2 fL (ref 78.0–100.0)
Platelets: 204 10*3/uL (ref 150–400)
RBC: 3.28 MIL/uL — ABNORMAL LOW (ref 4.22–5.81)
RDW: 15.4 % (ref 11.5–15.5)

## 2012-11-21 LAB — BASIC METABOLIC PANEL
BUN: 18 mg/dL (ref 6–23)
Chloride: 100 mEq/L (ref 96–112)
Creatinine, Ser: 1.22 mg/dL (ref 0.50–1.35)
GFR calc Af Amer: 69 mL/min — ABNORMAL LOW (ref >90)
GFR calc non Af Amer: 60 mL/min — ABNORMAL LOW (ref >90)
Potassium: 4.6 mEq/L (ref 3.5–5.1)

## 2012-11-21 LAB — CREATININE, FLUID (PLEURAL, PERITONEAL, JP DRAINAGE): Creat, Fluid: 1.1 mg/dL

## 2012-11-21 LAB — HEMOGLOBIN AND HEMATOCRIT, BLOOD: Hemoglobin: 9.9 g/dL — ABNORMAL LOW (ref 13.0–17.0)

## 2012-11-21 MED ORDER — BISACODYL 10 MG RE SUPP
10.0000 mg | Freq: Once | RECTAL | Status: AC
  Administered 2012-11-21: 10 mg via RECTAL
  Filled 2012-11-21: qty 1

## 2012-11-21 NOTE — Progress Notes (Signed)
Pt's hgb is now 10.8. Will cont to monitor patient. Dressing to his JP remains to be dry at this time. No c/o pain.

## 2012-11-21 NOTE — Progress Notes (Signed)
Dr. Sherron Monday notified of pt's hgb of 9.9, pt's vs and output of 100 ccs from f/c and 10ccs from the JP. Abd looks distended. Dressing around JP remains intact with a small amount of bloody drainage at the top of it. Will cont to monitor patient. Orders received from Dr. Sherron Monday.

## 2012-11-21 NOTE — Progress Notes (Signed)
Old reinforced dressing removed from JP site and new dressing reinforce the primary dressing. No c/o's from patient at this time.

## 2012-11-21 NOTE — Progress Notes (Signed)
Patient ID: Eric Osborn, male   DOB: 1945-12-26, 67 y.o.   MRN: [id number] Post-op note  Subjective: The patient is doing well.  No complaints.  He is sore.  He ambulated once over night.  Denies N/V.   Objective: Vital signs in last 24 hours: Temp:  [97 F (36.1 C)-99.2 F (37.3 C)] 98.6 F (37 C) (08/28 0512) Pulse Rate:  [76-101] 85 (08/28 0512) Resp:  [12-18] 18 (08/28 0512) BP: (100-134)/(63-78) 108/63 mmHg (08/28 0512) SpO2:  [96 %-100 %] 99 % (08/28 0512)  Intake/Output from previous day: 08/27 0701 - 08/28 0700 In: 5364.6 [I.V.:4314.6; IV Piggyback:1050] Out: 1450 [Urine:925; Drains:325; Blood:200] Intake/Output this shift:    Physical Exam:  General: Alert and oriented. Cardiac: RRR Lungs: decreased BS bases bilat Abdomen: Soft, Nondistended; approp tender; faint BS Incisions: Clean and dry. Urine: pink  Lab Results:  Recent Labs  11/20/12 1243 11/20/12 2314 11/21/12 0400  HGB 12.5* 10.8* 9.9*  HCT 37.7* 33.1* 31.3*    Assessment/Plan: POD#1  1) JP drainage has decreased appropriately, however, UO slightly low.  Will check JP Cr to be thorough. 2) Monitor UO.  Continue IVF for now and encourage PO intake 3)Clears only 4) Ducolax supp 5) IS and amb 6) Drop in H/H--recheck at noon to ensure stable 7) Poss d/c later today    LOS: 1 day   YARBROUGH,Ramon Brant G. 11/21/2012, 7:32 AM

## 2012-11-22 LAB — BASIC METABOLIC PANEL
BUN: 15 mg/dL (ref 6–23)
CO2: 27 mEq/L (ref 19–32)
Calcium: 8.7 mg/dL (ref 8.4–10.5)
Chloride: 102 mEq/L (ref 96–112)
Creatinine, Ser: 1.05 mg/dL (ref 0.50–1.35)
Glucose, Bld: 103 mg/dL — ABNORMAL HIGH (ref 70–99)

## 2012-11-22 LAB — HEMOGLOBIN AND HEMATOCRIT, BLOOD: Hemoglobin: 9.8 g/dL — ABNORMAL LOW (ref 13.0–17.0)

## 2012-11-22 NOTE — Progress Notes (Signed)
Patient ID: Eric Osborn, male   DOB: September 01, 1945, 67 y.o.   MRN: [id number] 2 Days Post-Op Subjective: Patient reports doing okay this morning. He did have some severe lower abdominal discomfort last night consistent with either some GI or possible bladder spasm. No real significant discomfort at this time. Urine output remains good. He has had a small bowel movement and some flatus.  Objective: Vital signs in last 24 hours: Temp:  [99.1 F (37.3 C)-99.7 F (37.6 C)] 99.6 F (37.6 C) (08/29 0521) Pulse Rate:  [94-108] 100 (08/29 0521) Resp:  [16-22] 20 (08/29 0521) BP: (110-129)/(59-70) 127/70 mmHg (08/29 0521) SpO2:  [96 %-98 %] 96 % (08/29 0521)  Intake/Output from previous day: 08/28 0701 - 08/29 0700 In: 1200 [P.O.:1200] Out: 1955 [Urine:1950; Drains:5] Intake/Output this shift:    Physical Exam:  Constitutional: Vital signs reviewed. WD WN in NAD   Eyes: PERRL, No scleral icterus.   Cardiovascular: RRR Pulmonary/Chest: Normal effort Abdominal: Soft. Mild distention. Mild diffuse tenderness. No active bleeding from incisions. Genitourinary:indwelling Foley catheter draining relatively clear urine. Extremities: No cyanosis or edema   Lab Results:  Recent Labs  11/21/12 0400 11/21/12 1040 11/22/12 0458  HGB 9.9* 9.7* 8.8*  HCT 31.3* 29.6* 26.7*   BMET  Recent Labs  11/21/12 0400 11/22/12 0458  NA 133* 133*  K 4.6 4.6  CL 100 102  CO2 25 27  GLUCOSE 153* 103*  BUN 18 15  CREATININE 1.22 1.05  CALCIUM 8.4 8.7   No results found for this basename: LABPT, INR,  in the last 72 hours No results found for this basename: LABURIN,  in the last 72 hours No results found for this or any previous visit.  Studies/Results: No results found.  Assessment/Plan:   Postoperative day 2 status post robotic prostatectomy. Patient has evidence of blood loss anemia.  Some of that may be dilutional from fairly vigorous postop hydration. No indication for transfusion or  other intervention at this time. I recommend repeat hemoglobin around 11:00 today. If relatively stable will consider discharge later today. If not we'll consider a ongoing observation over the next 48 hours to be sure that it does completely stabilized. Renal function improved. Good urinary output. No evidence of urinary leak.   LOS: 2 days   Darrio Bade S 11/22/2012, 8:20 AM

## 2012-11-26 NOTE — Discharge Summary (Signed)
  Date of admission: 11/20/2012  Date of discharge: 11/22/12  Admission diagnosis: Prostate Cancer  Discharge diagnosis: Prostate Cancer  History and Physical: For full details, please see admission history and physical. Briefly, Eric Osborn is a 67 y.o. gentleman with localized prostate cancer.  After discussing management/treatment options, he elected to proceed with surgical treatment.  Hospital Course: Eric Osborn was taken to the operating room on 11/20/2012 and underwent a robotic assisted laparoscopic radical prostatectomy. He tolerated this procedure well and without complications. Postoperatively, he was able to be transferred to a regular hospital room following recovery from anesthesia.  He remained hemodynamically stable overnight.  During the operative night and early on POD 1 he was noted to have decreased UO which responded appropriately to increased IVF and PO intake.  UO improved by the evening of POD 1.  He was able to begin ambulating on POD 1.  His JP initially had significant output, however, this decreased appropriately and the drain Cr level was consistent with serum.  JP was removed without difficulty on POD 1.  His H/H was noted to be decreased post operatively.  This was asymptomatic and stabilized by POD 2.   He was transitioned to oral pain medication, tolerated a regular diet, and had met all discharge criteria and was able to be discharged home later on POD#2.  Laboratory values:  Lab Results  Component Value Date   WBC 11.9* 11/21/2012   HGB 9.8* 11/22/2012   HCT 30.4* 11/22/2012   MCV 90.2 11/21/2012   PLT 204 11/21/2012   Lab Results  Component Value Date   CREATININE 1.05 11/22/2012    Disposition: Home  Discharge instruction: He was instructed to be ambulatory but to refrain from heavy lifting, strenuous activity, or driving. He was instructed on urethral catheter care.  Discharge medications:     Medication List    STOP taking these medications        RAPAFLO 8 MG Caps capsule  Generic drug:  silodosin      TAKE these medications       cephALEXin 500 MG capsule  Commonly known as:  KEFLEX  Take 500 mg by mouth 4 (four) times daily.     ciprofloxacin 500 MG tablet  Commonly known as:  CIPRO  Take 1 tablet (500 mg total) by mouth 2 (two) times daily. Start day prior to office visit for foley removal     HYDROcodone-acetaminophen 5-325 MG per tablet  Commonly known as:  NORCO/VICODIN  Take 1-2 tablets by mouth every 6 (six) hours as needed for pain.     hydrocortisone 2.5 % rectal cream  Commonly known as:  ANUSOL-HC  Place 1 application rectally 2 (two) times daily as needed (for rectal pain).        Followup: He will followup in 1 week for catheter removal and to discuss his surgical pathology results.

## 2012-11-27 DIAGNOSIS — N401 Enlarged prostate with lower urinary tract symptoms: Secondary | ICD-10-CM | POA: Diagnosis not present

## 2012-11-28 DIAGNOSIS — C61 Malignant neoplasm of prostate: Secondary | ICD-10-CM | POA: Diagnosis not present

## 2012-12-05 DIAGNOSIS — C61 Malignant neoplasm of prostate: Secondary | ICD-10-CM | POA: Diagnosis not present

## 2012-12-19 DIAGNOSIS — N393 Stress incontinence (female) (male): Secondary | ICD-10-CM | POA: Diagnosis not present

## 2012-12-19 DIAGNOSIS — C61 Malignant neoplasm of prostate: Secondary | ICD-10-CM | POA: Diagnosis not present

## 2012-12-19 DIAGNOSIS — R279 Unspecified lack of coordination: Secondary | ICD-10-CM | POA: Diagnosis not present

## 2012-12-19 DIAGNOSIS — M6281 Muscle weakness (generalized): Secondary | ICD-10-CM | POA: Diagnosis not present

## 2012-12-30 DIAGNOSIS — R279 Unspecified lack of coordination: Secondary | ICD-10-CM | POA: Diagnosis not present

## 2012-12-30 DIAGNOSIS — N393 Stress incontinence (female) (male): Secondary | ICD-10-CM | POA: Diagnosis not present

## 2012-12-30 DIAGNOSIS — M6281 Muscle weakness (generalized): Secondary | ICD-10-CM | POA: Diagnosis not present

## 2013-01-13 DIAGNOSIS — M6281 Muscle weakness (generalized): Secondary | ICD-10-CM | POA: Diagnosis not present

## 2013-01-13 DIAGNOSIS — N393 Stress incontinence (female) (male): Secondary | ICD-10-CM | POA: Diagnosis not present

## 2013-01-13 DIAGNOSIS — R279 Unspecified lack of coordination: Secondary | ICD-10-CM | POA: Diagnosis not present

## 2013-01-30 ENCOUNTER — Other Ambulatory Visit: Payer: Self-pay

## 2013-01-30 DIAGNOSIS — N393 Stress incontinence (female) (male): Secondary | ICD-10-CM | POA: Diagnosis not present

## 2013-01-30 DIAGNOSIS — M6281 Muscle weakness (generalized): Secondary | ICD-10-CM | POA: Diagnosis not present

## 2013-01-30 DIAGNOSIS — R279 Unspecified lack of coordination: Secondary | ICD-10-CM | POA: Diagnosis not present

## 2013-01-30 DIAGNOSIS — C61 Malignant neoplasm of prostate: Secondary | ICD-10-CM | POA: Diagnosis not present

## 2013-04-01 DIAGNOSIS — C61 Malignant neoplasm of prostate: Secondary | ICD-10-CM | POA: Diagnosis not present

## 2013-04-04 DIAGNOSIS — N401 Enlarged prostate with lower urinary tract symptoms: Secondary | ICD-10-CM | POA: Diagnosis not present

## 2013-04-04 DIAGNOSIS — N138 Other obstructive and reflux uropathy: Secondary | ICD-10-CM | POA: Diagnosis not present

## 2013-04-04 DIAGNOSIS — C61 Malignant neoplasm of prostate: Secondary | ICD-10-CM | POA: Diagnosis not present

## 2013-04-04 DIAGNOSIS — N139 Obstructive and reflux uropathy, unspecified: Secondary | ICD-10-CM | POA: Diagnosis not present

## 2013-05-01 ENCOUNTER — Ambulatory Visit (INDEPENDENT_AMBULATORY_CARE_PROVIDER_SITE_OTHER): Payer: Medicare Other | Admitting: Physician Assistant

## 2013-05-01 ENCOUNTER — Encounter: Payer: Self-pay | Admitting: Physician Assistant

## 2013-05-01 ENCOUNTER — Ambulatory Visit: Payer: Medicare Other | Admitting: Physician Assistant

## 2013-05-01 ENCOUNTER — Ambulatory Visit (INDEPENDENT_AMBULATORY_CARE_PROVIDER_SITE_OTHER)
Admission: RE | Admit: 2013-05-01 | Discharge: 2013-05-01 | Disposition: A | Discharge status: Home / Self Care | Payer: Medicare Other | Source: Ambulatory Visit | Attending: Physician Assistant | Admitting: Physician Assistant

## 2013-05-01 VITALS — BP 110/82 | HR 93 | Temp 98.1°F | Ht 73.0 in | Wt 176.1 lb

## 2013-05-01 DIAGNOSIS — M25562 Pain in left knee: Secondary | ICD-10-CM

## 2013-05-01 DIAGNOSIS — M79609 Pain in unspecified limb: Secondary | ICD-10-CM | POA: Diagnosis not present

## 2013-05-01 DIAGNOSIS — S8990XA Unspecified injury of unspecified lower leg, initial encounter: Secondary | ICD-10-CM | POA: Diagnosis not present

## 2013-05-01 DIAGNOSIS — M25569 Pain in unspecified knee: Secondary | ICD-10-CM

## 2013-05-01 DIAGNOSIS — S79919A Unspecified injury of unspecified hip, initial encounter: Secondary | ICD-10-CM | POA: Diagnosis not present

## 2013-05-01 DIAGNOSIS — M79605 Pain in left leg: Secondary | ICD-10-CM

## 2013-05-01 NOTE — Progress Notes (Signed)
Subjective:    Patient ID: JIRO KIESTER, male    DOB: 1945-11-09, 68 y.o.   MRN: [id number]  HPI Comments: Patient is a 68 year old male who presents to the clinic today for left knee pain. Patient reports nearly three weeks PTA he was cleaning out gutters while on a ladder, reports ladder slipped on decking and fell with patient "riding the ladder" down to the ground. States he had a few scrapes to the knee and shin from the ladder, which are healing well. Is able to bear weight on leg however, has noted when he tries to straighten the left knee it feels like it "catches" and has pain above kneecap. Also states when straightening knee he has pain radiate up the left lateral thigh. Reports no other injury or concerns. Denies hip pain, numbness or tingling to distal extremity. Has used nothing for pain relief.    Review of Systems  Respiratory: Negative.  Negative for shortness of breath and wheezing.   Cardiovascular: Negative for chest pain and palpitations.  Musculoskeletal: Positive for arthralgias (left knee pain with extension, radiates to left thigh) and joint swelling (left knee was swollen, now resolved).  Skin:       Scrapes to left knee and lower leg, healing  Neurological: Negative for weakness and numbness.  All other systems reviewed and are negative.   Past Medical History  Diagnosis Date  . Arthritis     left knee, left shoulder  . Hematuria     occasional  . History of bladder infections   . Joint pain   . H/O hiatal hernia   . Hemorrhoid   . Foley catheter in place   . Erectile dysfunction 06/25/2012  . Goiter 06/25/2012      Objective:   Physical Exam  Vitals reviewed. Constitutional: He is oriented to person, place, and time. He appears well-developed and well-nourished.  HENT:  Head: Normocephalic and atraumatic.  Eyes: Conjunctivae are normal.  Neck: Normal range of motion.  Cardiovascular: Normal rate and intact distal pulses.   Pulmonary/Chest: Effort  normal and breath sounds normal.  Musculoskeletal:  Left knee joint appears intact, mild tenderness noted to suprapatellar bursa and medial aspect of joint. Overlying soft tissue without tactile heat, erythema, ecchymosis or fluctuance. Nearly full extension of knee joint, full flexion. Distal 2/3 of left femur TTP along IT band. No tenderness to tib/fib. Compartments of upper and lower leg soft. Minor lesions to anterior tib, well healed, non tender. Anterior knee with minor scrapes, well healed and larger area still healing. No drainage noted.   Neurological: He is alert and oriented to person, place, and time. He has normal strength. No sensory deficit.  Skin: Skin is warm and dry.  Psychiatric: He has a normal mood and affect.   Lab Results  Component Value Date   WBC 11.9* 11/21/2012   HGB 9.8* 11/22/2012   HCT 30.4* 11/22/2012   PLT 204 11/21/2012   GLUCOSE 103* 11/22/2012   CHOL 160 06/25/2012   TRIG 74.0 06/25/2012   HDL 46.80 06/25/2012   LDLCALC 98 06/25/2012   NA 133* 11/22/2012   K 4.6 11/22/2012   CL 102 11/22/2012   CREATININE 1.05 11/22/2012   BUN 15 11/22/2012   CO2 27 11/22/2012   TSH 1.53 06/25/2012   PSA 7.99* 06/25/2012      Assessment & Plan:   Left knee pain.   Will xray left knee and femur. Instructed patient to use Ibuprofen and rest  the knee joint. Discussed use of compression sleeve when up and moving around.  If no improvement may require referral to Dr. Gardenia Phlegm

## 2013-05-01 NOTE — Patient Instructions (Signed)
It was great meeting you today [name]! Knee Pain Knee pain can be a result of an injury or other medical conditions. Treatment will depend on the cause of your pain. HOME CARE  Only take medicine as told by your doctor.  Keep a healthy weight. Being overweight can make the knee hurt more.  Stretch before exercising or playing sports.  If there is constant knee pain, change the way you exercise. Ask your doctor for advice.  Make sure shoes fit well. Choose the right shoe for the sport or activity.  Protect your knees. Wear kneepads if needed.  Rest when you are tired. GET HELP RIGHT AWAY IF:   Your knee pain does not stop.  Your knee pain does not get better.  Your knee joint feels hot to the touch.  You have a fever. MAKE SURE YOU:   Understand these instructions.  Will watch this condition.  Will get help right away if you are not doing well or get worse. Document Released: 06/09/2008 Document Revised: 06/05/2011 Document Reviewed: 06/09/2008 [facility] Patient Information 2014 [place], [place].

## 2013-05-01 NOTE — Progress Notes (Signed)
Pre-visit discussion using our clinic review tool. No additional management support is needed unless otherwise documented below in the visit note.  

## 2013-07-29 DIAGNOSIS — C61 Malignant neoplasm of prostate: Secondary | ICD-10-CM | POA: Diagnosis not present

## 2013-08-05 DIAGNOSIS — C61 Malignant neoplasm of prostate: Secondary | ICD-10-CM | POA: Diagnosis not present

## 2013-12-08 DIAGNOSIS — C61 Malignant neoplasm of prostate: Secondary | ICD-10-CM | POA: Diagnosis not present

## 2013-12-11 DIAGNOSIS — C61 Malignant neoplasm of prostate: Secondary | ICD-10-CM | POA: Diagnosis not present

## 2014-04-30 DIAGNOSIS — C61 Malignant neoplasm of prostate: Secondary | ICD-10-CM | POA: Diagnosis not present

## 2014-05-07 DIAGNOSIS — C61 Malignant neoplasm of prostate: Secondary | ICD-10-CM | POA: Diagnosis not present

## 2015-11-02 NOTE — Telephone Encounter (Signed)
1 

## 2016-01-18 DIAGNOSIS — M1712 Unilateral primary osteoarthritis, left knee: Secondary | ICD-10-CM | POA: Diagnosis not present

## 2016-01-18 DIAGNOSIS — M170 Bilateral primary osteoarthritis of knee: Secondary | ICD-10-CM | POA: Diagnosis not present

## 2016-01-18 DIAGNOSIS — G8929 Other chronic pain: Secondary | ICD-10-CM | POA: Diagnosis not present

## 2016-01-18 DIAGNOSIS — M25562 Pain in left knee: Secondary | ICD-10-CM | POA: Diagnosis not present

## 2016-01-18 DIAGNOSIS — M25561 Pain in right knee: Secondary | ICD-10-CM | POA: Diagnosis not present

## 2016-05-03 ENCOUNTER — Emergency Department (HOSPITAL_COMMUNITY): Payer: Medicare Other

## 2016-05-03 ENCOUNTER — Emergency Department (HOSPITAL_COMMUNITY)
Admission: EM | Admit: 2016-05-03 | Discharge: 2016-05-03 | Disposition: A | Discharge status: Home / Self Care | Payer: Medicare Other | Attending: Emergency Medicine | Admitting: Emergency Medicine

## 2016-05-03 ENCOUNTER — Encounter (HOSPITAL_COMMUNITY): Payer: Self-pay | Admitting: Emergency Medicine

## 2016-05-03 DIAGNOSIS — Z87891 Personal history of nicotine dependence: Secondary | ICD-10-CM | POA: Diagnosis not present

## 2016-05-03 DIAGNOSIS — M545 Low back pain, unspecified: Secondary | ICD-10-CM

## 2016-05-03 DIAGNOSIS — R1030 Lower abdominal pain, unspecified: Secondary | ICD-10-CM | POA: Insufficient documentation

## 2016-05-03 LAB — URINALYSIS, ROUTINE W REFLEX MICROSCOPIC
BILIRUBIN URINE: NEGATIVE
GLUCOSE, UA: NEGATIVE mg/dL
Ketones, ur: NEGATIVE mg/dL
Leukocytes, UA: NEGATIVE
NITRITE: NEGATIVE
PH: 5 (ref 5.0–8.0)
Protein, ur: 100 mg/dL — AB
SPECIFIC GRAVITY, URINE: 1.025 (ref 1.005–1.030)
SQUAMOUS EPITHELIAL / LPF: NONE SEEN

## 2016-05-03 LAB — CBC
HCT: 31.6 % — ABNORMAL LOW (ref 39.0–52.0)
Hemoglobin: 10.4 g/dL — ABNORMAL LOW (ref 13.0–17.0)
MCH: 28.5 pg (ref 26.0–34.0)
MCHC: 32.9 g/dL (ref 30.0–36.0)
MCV: 86.6 fL (ref 78.0–100.0)
PLATELETS: 131 10*3/uL — AB (ref 150–400)
RBC: 3.65 MIL/uL — AB (ref 4.22–5.81)
RDW: 17.2 % — AB (ref 11.5–15.5)
WBC: 11.8 10*3/uL — AB (ref 4.0–10.5)

## 2016-05-03 LAB — COMPREHENSIVE METABOLIC PANEL
ALT: 9 U/L — ABNORMAL LOW (ref 17–63)
AST: 33 U/L (ref 15–41)
Albumin: 4.4 g/dL (ref 3.5–5.0)
Alkaline Phosphatase: 1667 U/L — ABNORMAL HIGH (ref 38–126)
Anion gap: 7 (ref 5–15)
BILIRUBIN TOTAL: 0.7 mg/dL (ref 0.3–1.2)
BUN: 13 mg/dL (ref 6–20)
CALCIUM: 9.3 mg/dL (ref 8.9–10.3)
CO2: 26 mmol/L (ref 22–32)
Chloride: 105 mmol/L (ref 101–111)
Creatinine, Ser: 0.82 mg/dL (ref 0.61–1.24)
Glucose, Bld: 109 mg/dL — ABNORMAL HIGH (ref 65–99)
Potassium: 4.1 mmol/L (ref 3.5–5.1)
Sodium: 138 mmol/L (ref 135–145)
TOTAL PROTEIN: 7.2 g/dL (ref 6.5–8.1)

## 2016-05-03 LAB — LIPASE, BLOOD: LIPASE: 43 U/L (ref 11–51)

## 2016-05-03 MED ORDER — SODIUM CHLORIDE 0.9 % IJ SOLN
INTRAMUSCULAR | Status: AC
  Filled 2016-05-03: qty 50

## 2016-05-03 MED ORDER — IOPAMIDOL (ISOVUE-300) INJECTION 61%
100.0000 mL | Freq: Once | INTRAVENOUS | Status: AC | PRN
  Administered 2016-05-03: 100 mL via INTRAVENOUS

## 2016-05-03 MED ORDER — MORPHINE SULFATE (PF) 4 MG/ML IV SOLN
4.0000 mg | Freq: Once | INTRAVENOUS | Status: AC
  Administered 2016-05-03: 4 mg via INTRAVENOUS
  Filled 2016-05-03: qty 1

## 2016-05-03 MED ORDER — OXYCODONE-ACETAMINOPHEN 5-325 MG PO TABS: 1.0000 | ORAL_TABLET | Freq: Three times a day (TID) | ORAL | 0 refills | Status: DC | PRN

## 2016-05-03 MED ORDER — IOPAMIDOL (ISOVUE-300) INJECTION 61%
INTRAVENOUS | Status: AC
  Filled 2016-05-03: qty 100

## 2016-05-03 NOTE — ED Provider Notes (Signed)
[facility] DEPT Provider Note   CSN: [id number] Arrival date & time: 05/03/16  1052     History   Chief Complaint Chief Complaint  Patient presents with  . Back Pain  . Abdominal Pain    HPI Eric Osborn is a 71 y.o. male.  HPI  71 y.o. male with a hx of Prostate Cancer in 2014 that was resected, presents to the Emergency Department today complaining of lower abdominal/lower back pain x 3 months. Worsening pain that rates 10/10. No hx same. Noted painless hematuria recently that lasted for 10 days, but since has resolved. Denies N/V/D. No CP/SOB/ABD pain. Notes lower lumbar pain mostly without saddle anesthesia. Able to ambulate without difficulty. No loss of bowel or bladder function. Noted history of prostate cancer that was resected in 2014 by Dr. Michel Bickers. No other symptoms noted.   Past Medical History:  Diagnosis Date  . Arthritis    left knee, left shoulder  . Erectile dysfunction 06/25/2012  . Foley catheter in place   . Goiter 06/25/2012  . H/O hiatal hernia   . Hematuria    occasional  . Hemorrhoid   . History of bladder infections   . Joint pain     Patient Active Problem List   Diagnosis Date Noted  . Goiter 06/25/2012  . Erectile dysfunction 06/25/2012  . Arthritis 06/25/2012    Past Surgical History:  Procedure Laterality Date  . INGUINAL HERNIA REPAIR  8 yrs ago  . LYMPHADENECTOMY Bilateral 11/20/2012   Procedure: WITH BILATERAL PELVIC LYMPH NODE DISSECTION ;  Surgeon: [name], MD;  Location: [facility];  Service: Urology;  Laterality: Bilateral;  . ROBOT ASSISTED LAPAROSCOPIC RADICAL PROSTATECTOMY N/A 11/20/2012   Procedure: ROBOTIC ASSISTED LAPAROSCOPIC RADICAL PROSTATECTOMY;  Surgeon: [name], MD;  Location: [facility];  Service: Urology;  Laterality: N/A;       Home Medications    Prior to Admission medications   Not on File    Family History Family History  Problem Relation Age of Onset  . Heart disease Sister   . Stroke Sister     . Colon cancer Paternal Uncle   . Diabetes Neg Hx     Social History Social History  Substance Use Topics  . Smoking status: Former Smoker    Packs/day: 0.25    Years: 25.00    Types: Cigarettes  . Smokeless tobacco: Former Systems developer     Comment: quit date 2016  . Alcohol use 3.6 oz/week    6 Cans of beer per week     Comment: occasional beer      Allergies   Poison ivy extract [poison ivy extract]   Review of Systems Review of Systems ROS reviewed and all are negative for acute change except as noted in the HPI.  Physical Exam Updated Vital Signs BP 135/95 (BP Location: Right Arm)   Pulse 93   Temp 97.9 F (36.6 C) (Oral)   SpO2 99%   Physical Exam  Constitutional: He is oriented to person, place, and time. Vital signs are normal. He appears well-developed and well-nourished.  HENT:  Head: Normocephalic and atraumatic.  Right Ear: Hearing normal.  Left Ear: Hearing normal.  Eyes: Conjunctivae and EOM are normal. Pupils are equal, round, and reactive to light.  Neck: Normal range of motion. Neck supple.  Cardiovascular: Normal rate, regular rhythm and normal heart sounds.   No murmur heard. Pulmonary/Chest: Effort normal and breath sounds normal.  Abdominal: Soft. Normal appearance and bowel  sounds are normal. There is no tenderness. There is no rigidity, no rebound, no guarding, no CVA tenderness, no tenderness at McBurney's point and negative Murphy's sign.  Musculoskeletal: Normal range of motion.  Point tender on lumbar spine. No palpable or visible deformities. ROM intact.   Neurological: He is alert and oriented to person, place, and time. No cranial nerve deficit or sensory deficit.  BLE motor/sensation intact.   Skin: Skin is warm and dry.  Psychiatric: He has a normal mood and affect. His speech is normal and behavior is normal. Thought content normal.  Nursing note and vitals reviewed.  ED Treatments / Results  Labs (all labs ordered are listed, but  only abnormal results are displayed) Labs Reviewed  COMPREHENSIVE METABOLIC PANEL - Abnormal; Notable for the following:       Result Value   Glucose, Bld 109 (*)    ALT 9 (*)    Alkaline Phosphatase 1,667 (*)    All other components within normal limits  CBC - Abnormal; Notable for the following:    WBC 11.8 (*)    RBC 3.65 (*)    Hemoglobin 10.4 (*)    HCT 31.6 (*)    RDW 17.2 (*)    Platelets 131 (*)    All other components within normal limits  URINALYSIS, ROUTINE W REFLEX MICROSCOPIC - Abnormal; Notable for the following:    APPearance HAZY (*)    Hgb urine dipstick LARGE (*)    Protein, ur 100 (*)    Bacteria, UA RARE (*)    All other components within normal limits  LIPASE, BLOOD    EKG  EKG Interpretation None      Radiology Ct Abdomen Pelvis W Contrast  Result Date: 05/03/2016 CLINICAL DATA:  Lower abdominal and back pain for 3 months which has recently worsened. Hematuria for 10 days. EXAM: CT ABDOMEN AND PELVIS WITH CONTRAST TECHNIQUE: Multidetector CT imaging of the abdomen and pelvis was performed using the standard protocol following bolus administration of intravenous contrast. CONTRAST:  100 ml ISOVUE-300 IOPAMIDOL (ISOVUE-300) INJECTION 61% COMPARISON:  CT pelvis 09/05/2012. FINDINGS: Lower chest: No pleural or pericardial effusion. Mild dependent atelectasis. Hepatobiliary: No focal liver abnormality is seen. No gallstones, gallbladder wall thickening, or biliary dilatation. Pancreas: No focal pancreatic lesion is identified. The pancreatic duct is mildly prominent measuring 0.3 cm. Spleen: Normal in size without focal abnormality. Adrenals/Urinary Tract: The adrenal glands appear normal. Low attenuating lesion lower pole right kidney measuring 2.3 cm is unchanged since the prior CT. A second small right renal cyst is also identified. The urinary bladder is largely decompressed. Stomach/Bowel: There is a segment of narrowing of the proximal to mid transverse colon  measuring approximately 4.8 cm in length. The colon is otherwise unremarkable. Stomach and small bowel appear normal. Vascular/Lymphatic: Extensive atherosclerotic vascular disease is noted. A left periaortic lymph node on image 32 measures 1.3 cm short axis dimension. A left common iliac node on image 46 measures 1.2 cm short axis dimension. Multiple smaller retroperitoneal lymph nodes are identified. Reproductive: The prostate gland has an irregular superior border. The gland does not appear enlarged. It extends into the urinary bladder. Other: No fluid collection. Musculoskeletal: Innumerable sclerotic lesions are identified in all imaged bones. No fracture. 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 The prostate gland has an irregular appearance and the patient may have prostate carcinoma. A few enlarged retroperitoneal lymph nodes are identified worrisome for additional foci of metastatic disease. Thickening of  the walls of the transverse colon measuring approximately 4.8 cm in length could be due to spasm or incomplete distention but cannot be definitively characterized. Recommend correlation with colon cancer screening. Mild prominence of the pancreatic duct without pancreatic lesion identified is likely incidental. Atherosclerosis. Electronically Signed   By: Inge Rise M.D.   On: 05/03/2016 15:33   Procedures Procedures (including critical care time)  Medications Ordered in ED Medications  iopamidol (ISOVUE-300) 61 % injection (not administered)  sodium chloride 0.9 % injection (not administered)  morphine 4 MG/ML injection 4 mg (4 mg Intravenous Given 05/03/16 1423)  iopamidol (ISOVUE-300) 61 % injection 100 mL (100 mLs Intravenous Contrast Given 05/03/16 1457)   Initial Impression / Assessment and Plan / ED Course  I have reviewed the triage vital signs and the nursing notes.  Pertinent labs & imaging results that were available during my  care of the patient were reviewed by me and considered in my medical decision making (see chart for details).    Final Clinical Impressions(s) / ED Diagnoses  {I have reviewed and evaluated the relevant laboratory values. {I have reviewed and evaluated the relevant imaging studies.  {I have reviewed the relevant previous healthcare records.  {I obtained HPI from historian. {Patient discussed with supervising physician.  ED Course:  Assessment: Pt is a 70yM with hx Prostate Cancer in 2014, who presents with lower lumbar pain x 3 months that has been worsening. No loss of bowel or bladder. No saddle anesthesia. Ambulates without difficulty. Mild abdominal pain that is lower. Painless hematuria recently that has resolved. On exam, pt in NAD. Nontoxic/nonseptic appearing. VSS. Afebrile. Lungs CTA. Heart RRR. Abdomen nontender soft. Point tender on lower lumbar spine. CBC unremarkable. CMP shows isolated Alk Phos 1,667. Liver enzymes WNL. Lipase normal. 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 CT ABD/Pelvis showed sclerotic lesions on all imaged bones. Possible reoccurrence of prostate carcinoma. Given analgesia in ED. Seen by attending physician. Discussed with medical oncology (Dr. Irene Limbo). Will see in office as patient ambulatory without BLE neurological findings. Discussed with patient who agrees with plan. Strict return precautions given  Spouse contact number: [phone]  Disposition/Plan:  [place] acknowledges and agrees with plan  Supervising Physician Fatima Blank, MD  Final diagnoses:  Acute midline low back pain without sciatica    New Prescriptions New Prescriptions   No medications on file     [name], PA-C 05/03/16 [place], PA-C 05/03/16 [place], MD 05/03/16 1743

## 2016-05-03 NOTE — ED Notes (Signed)
Pt cannot use restroom at this time, aware urine specimen is needed.  

## 2016-05-03 NOTE — Discharge Instructions (Signed)
Please read and follow all provided instructions.  Your diagnoses today include:  1. Acute midline low back pain without sciatica     Tests performed today include: Vital signs. See below for your results today.   Medications prescribed:  Take as prescribed   Home care instructions:  Follow any educational materials contained in this packet.  Follow-up instructions: Please follow-up with Oncology for further evaluation of symptoms and treatment. They will call you and schedule an appointment. If you do not hear from them in 2-3 days. Please call [phone]   Return instructions:  Please return to the Emergency Department if you do not get better, if you get worse, or new symptoms OR  - Fever (temperature greater than 101.17F)  - Bleeding that does not stop with holding pressure to the area    -Severe pain (please note that you may be more sore the day after your accident)  - Chest Pain  - Difficulty breathing  - Severe nausea or vomiting  - Inability to tolerate food and liquids  - Passing out  - Skin becoming red around your wounds  - Change in mental status (confusion or lethargy)  - New numbness or weakness    Please return if you have any other emergent concerns.  Additional Information:  Your vital signs today were: BP 138/89    Pulse 94    Temp 98.4 F (36.9 C)    Resp 18    SpO2 98%  If your blood pressure (BP) was elevated above 135/85 this visit, please have this repeated by your doctor within one month. ---------------

## 2016-05-03 NOTE — ED Triage Notes (Signed)
Pt c/o lower abdominal pain and lower back pain that began about 3 months ago and has recently worsened; pt states he had hematuria for about 10 days this past month that has now resolved; denies N/V/D; denies dysuria

## 2016-05-04 ENCOUNTER — Telehealth: Payer: Self-pay | Admitting: Oncology

## 2016-05-04 NOTE — Telephone Encounter (Signed)
Lt vm regarding 02/12 appt with Dr. Alen Blew

## 2016-05-05 ENCOUNTER — Ambulatory Visit: Payer: Medicare Other | Admitting: Hematology

## 2016-05-08 ENCOUNTER — Ambulatory Visit (HOSPITAL_BASED_OUTPATIENT_CLINIC_OR_DEPARTMENT_OTHER): Payer: Medicare Other | Admitting: Hematology

## 2016-05-08 ENCOUNTER — Telehealth: Payer: Self-pay | Admitting: *Deleted

## 2016-05-08 ENCOUNTER — Telehealth: Payer: Self-pay | Admitting: Hematology

## 2016-05-08 ENCOUNTER — Other Ambulatory Visit: Payer: Self-pay | Admitting: *Deleted

## 2016-05-08 ENCOUNTER — Ambulatory Visit: Payer: Medicare Other | Admitting: Oncology

## 2016-05-08 ENCOUNTER — Encounter: Payer: Self-pay | Admitting: Hematology

## 2016-05-08 ENCOUNTER — Ambulatory Visit (HOSPITAL_BASED_OUTPATIENT_CLINIC_OR_DEPARTMENT_OTHER): Payer: Medicare Other

## 2016-05-08 VITALS — BP 122/54 | HR 87 | Temp 98.1°F | Resp 18 | Ht 73.0 in | Wt 182.4 lb

## 2016-05-08 DIAGNOSIS — C7951 Secondary malignant neoplasm of bone: Secondary | ICD-10-CM | POA: Diagnosis not present

## 2016-05-08 DIAGNOSIS — R300 Dysuria: Secondary | ICD-10-CM | POA: Diagnosis not present

## 2016-05-08 DIAGNOSIS — R748 Abnormal levels of other serum enzymes: Secondary | ICD-10-CM

## 2016-05-08 DIAGNOSIS — R630 Anorexia: Secondary | ICD-10-CM

## 2016-05-08 DIAGNOSIS — R634 Abnormal weight loss: Secondary | ICD-10-CM | POA: Diagnosis not present

## 2016-05-08 DIAGNOSIS — G893 Neoplasm related pain (acute) (chronic): Secondary | ICD-10-CM | POA: Diagnosis not present

## 2016-05-08 DIAGNOSIS — E559 Vitamin D deficiency, unspecified: Secondary | ICD-10-CM

## 2016-05-08 DIAGNOSIS — D630 Anemia in neoplastic disease: Secondary | ICD-10-CM

## 2016-05-08 DIAGNOSIS — D649 Anemia, unspecified: Secondary | ICD-10-CM

## 2016-05-08 DIAGNOSIS — C61 Malignant neoplasm of prostate: Secondary | ICD-10-CM | POA: Diagnosis not present

## 2016-05-08 DIAGNOSIS — K6389 Other specified diseases of intestine: Secondary | ICD-10-CM

## 2016-05-08 DIAGNOSIS — C799 Secondary malignant neoplasm of unspecified site: Secondary | ICD-10-CM

## 2016-05-08 DIAGNOSIS — K639 Disease of intestine, unspecified: Secondary | ICD-10-CM

## 2016-05-08 DIAGNOSIS — R319 Hematuria, unspecified: Secondary | ICD-10-CM

## 2016-05-08 LAB — CBC & DIFF AND RETIC
BASO%: 0.3 % (ref 0.0–2.0)
BASOS ABS: 0 10*3/uL (ref 0.0–0.1)
EOS%: 10 % — AB (ref 0.0–7.0)
Eosinophils Absolute: 1.1 10*3/uL — ABNORMAL HIGH (ref 0.0–0.5)
HEMATOCRIT: 32.4 % — AB (ref 38.4–49.9)
HEMOGLOBIN: 10.5 g/dL — AB (ref 13.0–17.1)
IMMATURE RETIC FRACT: 33.6 % — AB (ref 3.00–10.60)
LYMPH%: 66.6 % — ABNORMAL HIGH (ref 14.0–49.0)
MCH: 28.1 pg (ref 27.2–33.4)
MCHC: 32.4 g/dL (ref 32.0–36.0)
MCV: 86.6 fL (ref 79.3–98.0)
MONO#: 0.6 10*3/uL (ref 0.1–0.9)
MONO%: 4.9 % (ref 0.0–14.0)
NEUT#: 2.1 10*3/uL (ref 1.5–6.5)
NEUT%: 18.2 % — AB (ref 39.0–75.0)
Platelets: 141 10*3/uL (ref 140–400)
RBC: 3.74 10*6/uL — ABNORMAL LOW (ref 4.20–5.82)
RDW: 17.4 % — ABNORMAL HIGH (ref 11.0–14.6)
Retic %: 1.96 % — ABNORMAL HIGH (ref 0.80–1.80)
Retic Ct Abs: 73.3 10*3/uL (ref 34.80–93.90)
WBC: 11.3 10*3/uL — ABNORMAL HIGH (ref 4.0–10.3)
lymph#: 7.5 10*3/uL — ABNORMAL HIGH (ref 0.9–3.3)
nRBC: 4 % — ABNORMAL HIGH (ref 0–0)

## 2016-05-08 LAB — IRON AND TIBC
%SAT: 44 % (ref 20–55)
Iron: 119 ug/dL (ref 42–163)
TIBC: 269 ug/dL (ref 202–409)
UIBC: 149 ug/dL (ref 117–376)

## 2016-05-08 LAB — URINALYSIS, MICROSCOPIC - CHCC
Bilirubin (Urine): NEGATIVE
Glucose: NEGATIVE mg/dL
Ketones: NEGATIVE mg/dL
LEUKOCYTE ESTERASE: NEGATIVE
NITRITE: NEGATIVE
Protein: 100 mg/dL
SPECIFIC GRAVITY, URINE: 1.03 (ref 1.003–1.035)
UROBILINOGEN UR: 0.2 mg/dL (ref 0.2–1)
pH: 6 (ref 4.6–8.0)

## 2016-05-08 LAB — COMPREHENSIVE METABOLIC PANEL
ALBUMIN: 4.2 g/dL (ref 3.5–5.0)
ALT: 8 U/L (ref 0–55)
AST: 34 U/L (ref 5–34)
Anion Gap: 8 mEq/L (ref 3–11)
BILIRUBIN TOTAL: 0.59 mg/dL (ref 0.20–1.20)
BUN: 12.8 mg/dL (ref 7.0–26.0)
CALCIUM: 10.3 mg/dL (ref 8.4–10.4)
CO2: 26 mEq/L (ref 22–29)
Chloride: 104 mEq/L (ref 98–109)
Creatinine: 0.8 mg/dL (ref 0.7–1.3)
EGFR: >90 mL/min/{1.73_m2} (ref >90)
Glucose: 96 mg/dl (ref 70–140)
POTASSIUM: 4.3 meq/L (ref 3.5–5.1)
Sodium: 138 mEq/L (ref 136–145)
TOTAL PROTEIN: 7.5 g/dL (ref 6.4–8.3)

## 2016-05-08 LAB — TSH: TSH: 2.473 m[IU]/L (ref 0.320–4.118)

## 2016-05-08 LAB — FERRITIN: FERRITIN: 582 ng/mL — AB (ref 22–316)

## 2016-05-08 MED ORDER — BICALUTAMIDE 50 MG PO TABS: 50.0000 mg | ORAL_TABLET | Freq: Every day | ORAL | 0 refills | Status: DC

## 2016-05-08 MED ORDER — OXYCODONE HCL ER 10 MG PO T12A: 10.0000 mg | EXTENDED_RELEASE_TABLET | Freq: Two times a day (BID) | ORAL | 0 refills | Status: DC

## 2016-05-08 MED ORDER — DRONABINOL 5 MG PO CAPS: 5.0000 mg | ORAL_CAPSULE | Freq: Two times a day (BID) | ORAL | 0 refills | Status: DC

## 2016-05-08 MED ORDER — OXYCODONE-ACETAMINOPHEN 10-325 MG PO TABS: 1.0000 | ORAL_TABLET | ORAL | 0 refills | Status: DC | PRN

## 2016-05-08 NOTE — Telephone Encounter (Signed)
"  This is [facility] in [place], [place] calling for OxyContin 10 mg prescription Dr. Irene Limbo ordered for this patient.  It requires Prior authorization through [facility].  Please call [phone] for [facility] ID information for this patient.

## 2016-05-08 NOTE — Progress Notes (Signed)
Eric Osborn Kitchen    HEMATOLOGY/ONCOLOGY CONSULTATION NOTE  Date of Service: 05/08/2016  Patient Care Team: [name], NP as PCP - General (Internal Medicine)  CHIEF COMPLAINTS/PURPOSE OF CONSULTATION:  Multiple spinal bone mets concerning for metastatic prostate cancer  HISTORY OF PRESENTING ILLNESS:  Eric Osborn is a wonderful 71 y.o. male who has been referred to [place] from the [facility] long hospital ED by [name] PA-C for evaluation and management of metastatic malignancy concerning for metastatic prostate cancer.  Patient has had a h/o locally advanced prostate cancer diagnosed in 2014 and had a radical prostatectomy and pelvic LN dissection by Dr [name] in 10/2012. Pathology showed pT3b pN0 disease (with positive margins, extraprostatic extension, seminal vesicle involvement and angiolymphatic invasion). Bone scan was negative for metastatic disease. Patient report no post-op RT. He notes that he received lupron shots as per his urologist for 1 year post-operatively. He was being monitored by Dr [name] and last had clinical evaluation and PSA about 7months ago - PSA level not available in our system. He notes that he was lost to f/u since then.  Patient presented to the ED with worsening lower and middle back pain for 2 weeks. Also notes about 15-20lbs weight loss and anorexia over the last 6-8weeks. He also notes hematuria. No fevers/chills. Patient had a CT abd/pelvis in the ED which showed Innumerable sclerotic lesions in all imaged bones consistent metastatic disease. The prostate gland has an irregular appearance and the patient may have prostate carcinoma. A few enlarged retroperitoneal lymph nodes are identified worrisome for additional foci of metastatic disease.  Patient's pain was somewhat controlled with prn percocet in ED and he was discharged home with f/u with [place]. Patient notes that the patient wakes him up in the night.  No urinary retention. No loss of bowel or bladder control.  No new neurological symptoms in his extremities.    MEDICAL HISTORY:  Past Medical History:  Diagnosis Date  . Arthritis    left knee, left shoulder  . Erectile dysfunction 06/25/2012  . Foley catheter in place   . Goiter 06/25/2012  . H/O hiatal hernia   . Hematuria    occasional  . Hemorrhoid   . History of bladder infections   . Joint pain     SURGICAL HISTORY: Past Surgical History:  Procedure Laterality Date  . INGUINAL HERNIA REPAIR  8 yrs ago  . LYMPHADENECTOMY Bilateral 11/20/2012   Procedure: WITH BILATERAL PELVIC LYMPH NODE DISSECTION ;  Surgeon: [name], MD;  Location: [facility];  Service: Urology;  Laterality: Bilateral;  . ROBOT ASSISTED LAPAROSCOPIC RADICAL PROSTATECTOMY N/A 11/20/2012   Procedure: ROBOTIC ASSISTED LAPAROSCOPIC RADICAL PROSTATECTOMY;  Surgeon: [name], MD;  Location: [facility];  Service: Urology;  Laterality: N/A;    SOCIAL HISTORY: Social History   Social History  . Marital status: Divorced    Spouse name: N/A  . Number of children: N/A  . Years of education: 9   Occupational History  . Retired     Tourist information centre manager   Social History Main Topics  . Smoking status: Former Smoker    Packs/day: 0.25    Years: 25.00    Types: Cigarettes  . Smokeless tobacco: Former Systems developer     Comment: quit date 2016  . Alcohol use 3.6 oz/week    6 Cans of beer per week     Comment: occasional beer   . Drug use: Yes    Types: Marijuana     Comment: 3  x a month uses marijuana  . Sexual activity: Yes   Other Topics Concern  . Not on file   Social History Narrative   Regular exercise-no   Caffeine Use-yes    FAMILY HISTORY: Family History  Problem Relation Age of Onset  . Heart disease Sister   . Stroke Sister   . Colon cancer Paternal Uncle   . Diabetes Neg Hx     ALLERGIES:  is allergic to poison ivy extract [poison ivy extract].  MEDICATIONS:  Current Outpatient Prescriptions  Medication Sig Dispense Refill  . bicalutamide (CASODEX) 50 MG  tablet Take 1 tablet (50 mg total) by mouth daily. 30 tablet 0  . dronabinol (MARINOL) 5 MG capsule Take 1 capsule (5 mg total) by mouth 2 (two) times daily before lunch and supper. 60 capsule 0  . meloxicam (MOBIC) 7.5 MG tablet Take 7.5 mg by mouth every 4 (four) hours as needed for pain.    Eric Osborn Kitchen morphine (MS CONTIN) 15 MG 12 hr tablet Take 1 tablet (15 mg total) by mouth every 12 (twelve) hours. 60 tablet 0  . oxyCODONE-acetaminophen (PERCOCET) 10-325 MG tablet Take 1 tablet by mouth every 4 (four) hours as needed for pain. 60 tablet 0   No current facility-administered medications for this visit.     REVIEW OF SYSTEMS:    10 Point review of Systems was done is negative except as noted above.  PHYSICAL EXAMINATION: ECOG PERFORMANCE STATUS: 2 - Symptomatic, <50% confined to bed  . Vitals:   05/08/16 1022  BP: (!) 122/54  Pulse: 87  Resp: 18  Temp: 98.1 F (36.7 C)   Filed Weights   05/08/16 1022  Weight: 182 lb 6.4 oz (82.7 kg)   .Body mass index is 24.06 kg/m.   . Wt Readings from Last 3 Encounters:  05/08/16 182 lb 6.4 oz (82.7 kg)  05/01/13 176 lb 1.9 oz (79.9 kg)  11/20/12 170 lb (77.1 kg)   GENERAL:alert, in no acute distress and comfortable SKIN: skin color, texture, turgor are normal, no rashes or significant lesions EYES: normal, conjunctiva are pink and non-injected, sclera clear OROPHARYNX:no exudate, no erythema and lips, buccal mucosa, and tongue normal  NECK: supple, no JVD, thyroid normal size, non-tender, without nodularity LYMPH:  no palpable lymphadenopathy in the cervical, axillary or inguinal LUNGS: clear to auscultation with normal respiratory effort HEART: regular rate & rhythm,  no murmurs and no lower extremity edema ABDOMEN: abdomen soft, non-tender, normoactive bowel sounds  Musculoskeletal: no cyanosis of digits and no clubbing , tenderness to palpation over several areas over the central back. PSYCH: alert & oriented x 3 with fluent  speech NEURO: no focal motor/sensory deficits  LABORATORY DATA:  I have reviewed the data as listed  . CBC Latest Ref Rng & Units 05/08/2016 05/03/2016 11/22/2012  WBC 4.0 - 10.3 10e3/uL 11.3(H) 11.8(H) -  Hemoglobin 13.0 - 17.1 g/dL 10.5(L) 10.4(L) 9.8(L)  Hematocrit 38.4 - 49.9 % 32.4(L) 31.6(L) 30.4(L)  Platelets 140 - 400 10e3/uL 141 131(L) -    . CMP Latest Ref Rng & Units 05/08/2016 05/03/2016 11/22/2012  Glucose 70 - 140 mg/dl 96 109(H) 103(H)  BUN 7.0 - 26.0 mg/dL 12.8 13 15   Creatinine 0.7 - 1.3 mg/dL 0.8 0.82 1.05  Sodium 136 - 145 mEq/L 138 138 133(L)  Potassium 3.5 - 5.1 mEq/L 4.3 4.1 4.6  Chloride 101 - 111 mmol/L - 105 102  CO2 22 - 29 mEq/L 26 26 27   Calcium 8.4 - 10.4 mg/dL  10.3 9.3 8.7  Total Protein 6.4 - 8.3 g/dL 7.5 7.2 -  Total Bilirubin 0.20 - 1.20 mg/dL 0.59 0.7 -  Alkaline Phos 40 - 150 U/L 2,221(H) 1,667(H) -  AST 5 - 34 U/L 34 33 -  ALT 0 - 55 U/L 8 9(L) -   Component     Latest Ref Rng & Units 05/08/2016  Glucose     Negative mg/dL Negative  Bilirubin (Urine)     Negative Negative  Ketones     Negative mg/dL Negative  Specific Gravity, Urine     1.003 - 1.035 1.030  Blood     Negative Large  pH     4.6 - 8.0 6.0  Protein     Negative- <30 mg/dL 100  Urobilinogen, UR     0.2 - 1 mg/dL 0.2  Nitrite     Negative Negative  Leukocyte Esterase     Negative Negative  RBC / HPF     0 - 2 21-50  WBC, UA     0-2;Negative 0-2  Bacteria, UA     Negative- Trace Few  Epithelial Cells     Negative- Few Few  Mucus, UA     Negative- Small Small  Iron     42 - 163 ug/dL 119  TIBC     202 - 409 ug/dL 269  UIBC     117 - 376 ug/dL 149  %SAT     20 - 55 % 44  T3 Uptake Ratio     24 - 39 % 24  Free Thyroxine Index     1.2 - 4.9 1.7  Vitamin D, 25-Hydroxy     30.0 - 100.0 ng/mL 11.2 (L)  PSA     0.0 - 4.0 ng/mL 549.0 (H)  Ferritin     22 - 316 ng/ml 582 (H)  TSH     0.320 - 4.118 m(IU)/L 2.473  Thyroxine (T4)     4.5 - 12.0 ug/dL 7.2   T4,Free(Direct)     0.82 - 1.77 ng/dL 1.14       RADIOGRAPHIC STUDIES: I have personally reviewed the radiological images as listed and agreed with the findings in the report. Ct Chest W Contrast  Result Date: 05/12/2016 CLINICAL DATA:  Lung lesions. Probable prostate metastasis. History of prostate cancer in 2014. EXAM: CT CHEST WITH CONTRAST TECHNIQUE: Multidetector CT imaging of the chest was performed during intravenous contrast administration. CONTRAST:  87mL ISOVUE-300 IOPAMIDOL (ISOVUE-300) INJECTION 61% COMPARISON:  Abdominopelvic CT of 05/03/2016. Bone scan of 09/05/2012. FINDINGS: Cardiovascular: Aortic and branch vessel atherosclerosis. Tortuous thoracic aorta. Moderate cardiomegaly. Multivessel coronary artery atherosclerosis. Pulmonary artery enlargement, including a 3.3 cm outflow tract. No central pulmonary embolism, on this non-dedicated study. Mediastinum/Nodes: Left-sided thyroid enlargement with heterogeneity and calcifications within. Left lobe measures on the order of 5.2 x 5.0 cm. No hilar adenopathy. No middle mediastinal adenopathy. Increased number of prevascular nodes. Example 10 mm node on image 67/series 2. Lungs/Pleura: Trace bilateral pleural thickening, without pleural fluid. Tracheal deviation to the right. Mild bibasilar scarring. No suspicious pulmonary nodule or mass. Upper Abdomen: Normal imaged portions of the spleen, stomach, adrenal glands, left kidney. Upper pole right renal cyst. Too small to characterize lesions within the liver. Prominent dorsal duct entering the duodenum, suggesting pancreas divisum. Musculoskeletal: Multiple small bilateral axillary nodes. None are pathologic by size criteria. Diffuse sclerotic osseous metastasis. Remote left clavicle fracture. Diffuse idiopathic skeletal hyperostosis. IMPRESSION: 1. Diffuse osseous metastasis, as on abdominopelvic CT. 2.  No evidence of pulmonary metastasis. 3. Increased number and size of  prevascular/anterior mediastinal nodes. Cannot exclude nodal metastasis. Increased number of small axillary nodes are nonspecific. 4. Pulmonary artery enlargement suggests pulmonary arterial hypertension. 5.  Coronary artery atherosclerosis. Aortic atherosclerosis. 6. 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 7. Left-sided thyroid enlargement and heterogeneity causing tracheal deviation right. This could be further evaluated with thyroid ultrasound. Electronically Signed   By: Abigail Miyamoto M.D.   On: 05/12/2016 09:56   Nm Bone Scan Whole Body  Result Date: 05/12/2016 CLINICAL DATA:  Metastatic prostate cancer. Initial staging. PSA 549 EXAM: NUCLEAR MEDICINE WHOLE BODY BONE SCAN TECHNIQUE: Whole body anterior and posterior images were obtained approximately 3 hours after intravenous injection of radiopharmaceutical. RADIOPHARMACEUTICALS:  Twenty-two mCi Technetium-30m MDP IV COMPARISON:  09/05/2012 FINDINGS: Super scan appearance with no visible urinary activity. Axial and proximal appendicular skeleton shows confluent metastatic disease. Bilateral calvarial metastases. IMPRESSION: Confluent skeletal metastases in the axial and proximal appendicular skeleton. Bilateral calvarial metastases. Electronically Signed   By: Monte Fantasia M.D.   On: 05/12/2016 12:52   Ct Abdomen Pelvis W Contrast  Result Date: 05/03/2016 CLINICAL DATA:  Lower abdominal and back pain for 3 months which has recently worsened. Hematuria for 10 days. EXAM: CT ABDOMEN AND PELVIS WITH CONTRAST TECHNIQUE: Multidetector CT imaging of the abdomen and pelvis was performed using the standard protocol following bolus administration of intravenous contrast. CONTRAST:  100 ml ISOVUE-300 IOPAMIDOL (ISOVUE-300) INJECTION 61% COMPARISON:  CT pelvis 09/05/2012. FINDINGS: Lower chest: No pleural or pericardial effusion. Mild dependent atelectasis. Hepatobiliary: No focal liver abnormality is seen. No gallstones, gallbladder wall thickening, or biliary dilatation.  Pancreas: No focal pancreatic lesion is identified. The pancreatic duct is mildly prominent measuring 0.3 cm. Spleen: Normal in size without focal abnormality. Adrenals/Urinary Tract: The adrenal glands appear normal. Low attenuating lesion lower pole right kidney measuring 2.3 cm is unchanged since the prior CT. A second small right renal cyst is also identified. The urinary bladder is largely decompressed. Stomach/Bowel: There is a segment of narrowing of the proximal to mid transverse colon measuring approximately 4.8 cm in length. The colon is otherwise unremarkable. Stomach and small bowel appear normal. Vascular/Lymphatic: Extensive atherosclerotic vascular disease is noted. A left periaortic lymph node on image 32 measures 1.3 cm short axis dimension. A left common iliac node on image 46 measures 1.2 cm short axis dimension. Multiple smaller retroperitoneal lymph nodes are identified. Reproductive: The prostate gland has an irregular superior border. The gland does not appear enlarged. It extends into the urinary bladder. Other: No fluid collection. Musculoskeletal: Innumerable sclerotic lesions are identified in all imaged bones. No fracture. 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 The prostate gland has an irregular appearance and the patient may have prostate carcinoma. A few enlarged retroperitoneal lymph nodes are identified worrisome for additional foci of metastatic disease. Thickening of the walls of the transverse colon measuring approximately 4.8 cm in length could be due to spasm or incomplete distention but cannot be definitively characterized. Recommend correlation with colon cancer screening. Mild prominence of the pancreatic duct without pancreatic lesion identified is likely incidental. Atherosclerosis. Electronically Signed   By: Inge Rise M.D.   On: 05/03/2016 15:33    ASSESSMENT & PLAN:   71 yo AAM with previous h/o of locally  advanced pT3b,pN0 prostate cancer now with concern for newly noted metastatic prostate cancer.  1) Metastatic Prostate Cancer  Patient is noted to have extensive osseous metastatic disease throughout the  spine and bilaterally in the calvarium as well. Also noted to have local recurrence in the prostatic bed, retroperitoneal Lnadenoapathy and some mediastinal LNadenoapathy. No evidence of cord compression or focal neurological symptoms. This was qualify as high volume disease. ECOG PS 1 Previously locally advanced pT3b,pN0 diagnosed in 10/2012 s/p radical prostatectomy and ADT x 1 yr.  2) Elevated alkaline phosphatase level due to extensive bone mets.  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 -senna-s bowel prophylaxis -Ergocalciferol 50k units weekly x 12 weeks. -baseline testosterone levels -continue urology f/u with Dr [name] -started on marinol BID for cancer related anorexia with weight loss. -will need dietician input.  7) Colonic thickening in transverse colon thought to be related to spasm/incomplete distension of colon in this area. -will need colonoscopy at some point with PCP  Labs today CT chest and Whole body bone scan in next 2-3 days  RTC with Dr Irene Limbo in about 7-10 days with above labs and imaging studies Schedule for Xgeva and Lupron on day of f/u with Dr Irene Limbo  All of the patients questions were answered with apparent satisfaction. The patient knows to call the clinic with any problems, questions or concerns.  I spent 60 minutes counseling the patient face to face. The total time spent in the appointment was 80 minutes and more than 50% was  on counseling and direct patient cares.    Eric Lone MD [place] AAHIVMS [facility] Hematology/Oncology Physician [facility]  (Office):       [phone] (Work cell):  [phone] (Fax):           [phone]  05/08/2016 9:52 AM

## 2016-05-08 NOTE — Progress Notes (Signed)
Received forwarded vm from [place] regarding PA for Oxycontin. Called [employer] to verify insurance information listed on file for patient and was placed on hold for about 10 minutes.   Called patient to verify information so authorization may be obtained from correct insurance, no answer left a voicemail at [phone] which was the number given by the Freda Munro whom answered at the number listed [phone] for him. She states he has limited minutes.

## 2016-05-08 NOTE — Telephone Encounter (Signed)
Appointments scheduled per 2/12 LOS. Patient given AVS report and calendars with future scheduled appointments. °

## 2016-05-09 ENCOUNTER — Encounter: Payer: Self-pay | Admitting: Hematology

## 2016-05-09 LAB — T3 UPTAKE
Free Thyroxine Index: 1.7 (ref 1.2–4.9)
T3 Uptake Ratio: 24 % (ref 24–39)

## 2016-05-09 LAB — URINE CULTURE: ORGANISM ID, BACTERIA: NO GROWTH

## 2016-05-09 LAB — PSA: PROSTATE SPECIFIC AG, SERUM: 549 ng/mL — AB (ref 0.0–4.0)

## 2016-05-09 LAB — T4, FREE: T4,Free(Direct): 1.14 ng/dL (ref 0.82–1.77)

## 2016-05-09 LAB — VITAMIN D 25 HYDROXY (VIT D DEFICIENCY, FRACTURES): VIT D 25 HYDROXY: 11.2 ng/mL — AB (ref 30.0–100.0)

## 2016-05-09 LAB — T4: Thyroxine (T4): 7.2 ug/dL (ref 4.5–12.0)

## 2016-05-09 NOTE — Progress Notes (Signed)
Obtained clinical answers from physician and signature. Faxed to [email or web address] [phone]. Fax received ok per confirmation sheet. Response should be received in 24-72 hours.

## 2016-05-09 NOTE — Progress Notes (Signed)
Called patient back to confirm insurance. [place] back whom provided me with his prescription drug benefit information as: [employer] Part D [phone] [place]:[id number] id).  Called [employer] to have clinical PA form faxed. Will give to RN to complete and return to me to be faxed back.

## 2016-05-11 ENCOUNTER — Other Ambulatory Visit: Payer: Self-pay | Admitting: *Deleted

## 2016-05-11 ENCOUNTER — Telehealth: Payer: Self-pay | Admitting: *Deleted

## 2016-05-11 MED ORDER — MORPHINE SULFATE ER 15 MG PO TBCR: 15.0000 mg | EXTENDED_RELEASE_TABLET | Freq: Two times a day (BID) | ORAL | 0 refills | Status: DC

## 2016-05-11 NOTE — Telephone Encounter (Signed)
SW patient to inform him Eric Osborn will not cover RX for oxycontin.  New rx for MS contin ready for pickup.  Pt stated he had apt tomorrow 2/16 and he will pick up Rx then.  Also informed pt that he needed to pick up casodex rx, and begin taking prior to lupron injection scheduled next week.  Pt verbalized understanding.

## 2016-05-12 ENCOUNTER — Ambulatory Visit (HOSPITAL_COMMUNITY)
Admission: RE | Admit: 2016-05-12 | Discharge: 2016-05-12 | Disposition: A | Discharge status: Home / Self Care | Payer: Medicare Other | Source: Ambulatory Visit | Attending: Hematology | Admitting: Hematology

## 2016-05-12 ENCOUNTER — Encounter (HOSPITAL_COMMUNITY): Payer: Self-pay

## 2016-05-12 ENCOUNTER — Encounter (HOSPITAL_COMMUNITY)
Admission: RE | Admit: 2016-05-12 | Discharge: 2016-05-12 | Disposition: A | Discharge status: Home / Self Care | Payer: Medicare Other | Source: Ambulatory Visit | Attending: Hematology | Admitting: Hematology

## 2016-05-12 DIAGNOSIS — C799 Secondary malignant neoplasm of unspecified site: Secondary | ICD-10-CM

## 2016-05-12 DIAGNOSIS — C61 Malignant neoplasm of prostate: Secondary | ICD-10-CM | POA: Insufficient documentation

## 2016-05-12 DIAGNOSIS — I700 Atherosclerosis of aorta: Secondary | ICD-10-CM | POA: Insufficient documentation

## 2016-05-12 DIAGNOSIS — I7789 Other specified disorders of arteries and arterioles: Secondary | ICD-10-CM | POA: Diagnosis not present

## 2016-05-12 DIAGNOSIS — R918 Other nonspecific abnormal finding of lung field: Secondary | ICD-10-CM | POA: Diagnosis not present

## 2016-05-12 DIAGNOSIS — J398 Other specified diseases of upper respiratory tract: Secondary | ICD-10-CM | POA: Insufficient documentation

## 2016-05-12 DIAGNOSIS — C7951 Secondary malignant neoplasm of bone: Secondary | ICD-10-CM

## 2016-05-12 DIAGNOSIS — I2510 Atherosclerotic heart disease of native coronary artery without angina pectoris: Secondary | ICD-10-CM | POA: Insufficient documentation

## 2016-05-12 MED ORDER — IOPAMIDOL (ISOVUE-300) INJECTION 61%
INTRAVENOUS | Status: AC
  Filled 2016-05-12: qty 75

## 2016-05-12 MED ORDER — TECHNETIUM TC 99M MEDRONATE IV KIT
25.0000 | PACK | Freq: Once | INTRAVENOUS | Status: AC | PRN
  Administered 2016-05-12: 25 via INTRAVENOUS

## 2016-05-12 MED ORDER — IOPAMIDOL (ISOVUE-300) INJECTION 61%
75.0000 mL | Freq: Once | INTRAVENOUS | Status: AC | PRN
  Administered 2016-05-12: 75 mL via INTRAVENOUS

## 2016-05-12 MED ORDER — SODIUM CHLORIDE 0.9 % IJ SOLN
INTRAMUSCULAR | Status: AC
  Filled 2016-05-12: qty 50

## 2016-05-13 DIAGNOSIS — C61 Malignant neoplasm of prostate: Secondary | ICD-10-CM | POA: Insufficient documentation

## 2016-05-13 MED ORDER — VITAMIN D (ERGOCALCIFEROL) 1.25 MG (50000 UNIT) PO CAPS: 50000.0000 [IU] | ORAL_CAPSULE | ORAL | 0 refills | Status: DC

## 2016-05-13 MED ORDER — PROCHLORPERAZINE MALEATE 10 MG PO TABS: 10.0000 mg | ORAL_TABLET | Freq: Four times a day (QID) | ORAL | 1 refills | Status: DC | PRN

## 2016-05-13 MED ORDER — ONDANSETRON HCL 8 MG PO TABS: 8.0000 mg | ORAL_TABLET | Freq: Two times a day (BID) | ORAL | 1 refills | Status: DC | PRN

## 2016-05-13 MED ORDER — DEXAMETHASONE 4 MG PO TABS: 8.0000 mg | ORAL_TABLET | Freq: Two times a day (BID) | ORAL | 1 refills | Status: DC

## 2016-05-13 NOTE — Progress Notes (Signed)
START ON PATHWAY REGIMEN - Prostate  POS77: Docetaxel 75 mg/m2 q21 Days Without Prednisone x 6 Cycles with Medical Castration  Docetaxel 75 mg/m2:   A cycle is every 21 days:     Docetaxel (Taxotere(R)) 75 mg/m2 in 250 mL NS IV over one hour       Dose Mod: None         Additional Orders: Premedicate with dexamethasone 8 mg PO BID for three days beginning 1 day prior to therapy  **Always confirm dose/schedule in your [facility] Bical[facility]:   A cycle is every 12 weeks:     Leuprolide acetate depot (Lupron(R)) 22.5 mg flat dose intramuscularly every 12 weeks       Dose Mod: None   Daily:     Bicalutamide (Casodex(R)) 50 mg orally once a day       Dose Mod: None  **Always confirm dose/schedule in your pharmacy ordering system**    Patient Characteristics: Adenocarcinoma, Metastatic, Hormone Naive, High Volume Disease* AJCC T Stage: X AJCC Stage Grouping: IV Current radiographic evidence of distant metastasis? Yes PSA: X Gleason Primary: X Gleason Secondary: X Gleason Score: X AJCC M Stage: X AJCC N Stage: X Would you be surprised if this patient died  in the next year? I would NOT be surprised if this patient died in the next year  Intent of Therapy: Non-Curative / Palliative Intent, Discussed with Patient

## 2016-05-15 ENCOUNTER — Telehealth: Payer: Self-pay | Admitting: *Deleted

## 2016-05-15 ENCOUNTER — Other Ambulatory Visit: Payer: Self-pay | Admitting: *Deleted

## 2016-05-15 ENCOUNTER — Telehealth: Payer: Self-pay | Admitting: Hematology

## 2016-05-15 NOTE — Telephone Encounter (Signed)
lvm on home and mobile phone to inform pt of appts this week per LOS

## 2016-05-15 NOTE — Telephone Encounter (Signed)
SW pt regarding upcoming apts.  Had 2/20 apts moved to 2/21 so that patient didn't have to come 3 days this week.  Instructed pt not to come 2/20 and to come 2/21 @ 10am.  Pt verbalized understanding.  Also instructed pt to be sure to pick up casodex and premedications at pharmacy prior to injection on 2/21.  Pt stated that pharmacy did not have casodex available as of 2/16 but will try to pick up today.  Instructed pt to call for questions/concerns.  Pt verbalized understanding.

## 2016-05-16 ENCOUNTER — Other Ambulatory Visit: Payer: Medicare Other

## 2016-05-17 ENCOUNTER — Ambulatory Visit (HOSPITAL_BASED_OUTPATIENT_CLINIC_OR_DEPARTMENT_OTHER): Payer: Medicare Other | Admitting: Hematology

## 2016-05-17 ENCOUNTER — Other Ambulatory Visit: Payer: Medicare Other

## 2016-05-17 ENCOUNTER — Ambulatory Visit (HOSPITAL_BASED_OUTPATIENT_CLINIC_OR_DEPARTMENT_OTHER): Payer: Medicare Other

## 2016-05-17 ENCOUNTER — Other Ambulatory Visit: Payer: Self-pay | Admitting: *Deleted

## 2016-05-17 ENCOUNTER — Encounter: Payer: Self-pay | Admitting: Hematology

## 2016-05-17 ENCOUNTER — Telehealth: Payer: Self-pay | Admitting: Hematology

## 2016-05-17 ENCOUNTER — Encounter: Payer: Self-pay | Admitting: *Deleted

## 2016-05-17 VITALS — BP 120/63 | HR 92 | Temp 97.5°F | Resp 18 | Ht 73.0 in | Wt 183.6 lb

## 2016-05-17 DIAGNOSIS — R319 Hematuria, unspecified: Secondary | ICD-10-CM

## 2016-05-17 DIAGNOSIS — D630 Anemia in neoplastic disease: Secondary | ICD-10-CM | POA: Diagnosis not present

## 2016-05-17 DIAGNOSIS — C7951 Secondary malignant neoplasm of bone: Secondary | ICD-10-CM | POA: Diagnosis not present

## 2016-05-17 DIAGNOSIS — G893 Neoplasm related pain (acute) (chronic): Secondary | ICD-10-CM | POA: Diagnosis not present

## 2016-05-17 DIAGNOSIS — C61 Malignant neoplasm of prostate: Secondary | ICD-10-CM

## 2016-05-17 DIAGNOSIS — Z5111 Encounter for antineoplastic chemotherapy: Secondary | ICD-10-CM | POA: Diagnosis present

## 2016-05-17 DIAGNOSIS — E559 Vitamin D deficiency, unspecified: Secondary | ICD-10-CM | POA: Diagnosis not present

## 2016-05-17 DIAGNOSIS — R634 Abnormal weight loss: Secondary | ICD-10-CM

## 2016-05-17 DIAGNOSIS — Z7189 Other specified counseling: Secondary | ICD-10-CM

## 2016-05-17 DIAGNOSIS — R748 Abnormal levels of other serum enzymes: Secondary | ICD-10-CM | POA: Diagnosis not present

## 2016-05-17 DIAGNOSIS — R630 Anorexia: Secondary | ICD-10-CM

## 2016-05-17 LAB — COMPREHENSIVE METABOLIC PANEL
ALT: 9 U/L (ref 0–55)
ANION GAP: 7 meq/L (ref 3–11)
AST: 31 U/L (ref 5–34)
Albumin: 3.9 g/dL (ref 3.5–5.0)
BILIRUBIN TOTAL: 0.5 mg/dL (ref 0.20–1.20)
BUN: 13.5 mg/dL (ref 7.0–26.0)
CO2: 26 mEq/L (ref 22–29)
Calcium: 9.7 mg/dL (ref 8.4–10.4)
Chloride: 106 mEq/L (ref 98–109)
Creatinine: 0.8 mg/dL (ref 0.7–1.3)
Glucose: 118 mg/dl (ref 70–140)
Potassium: 4.4 mEq/L (ref 3.5–5.1)
Sodium: 139 mEq/L (ref 136–145)
TOTAL PROTEIN: 6.7 g/dL (ref 6.4–8.3)

## 2016-05-17 LAB — CBC WITH DIFFERENTIAL/PLATELET
BASO%: 0.4 % (ref 0.0–2.0)
BASOS ABS: 0 10*3/uL (ref 0.0–0.1)
EOS ABS: 1.1 10*3/uL — AB (ref 0.0–0.5)
EOS%: 10 % — ABNORMAL HIGH (ref 0.0–7.0)
HCT: 29.2 % — ABNORMAL LOW (ref 38.4–49.9)
HEMOGLOBIN: 9.3 g/dL — AB (ref 13.0–17.1)
LYMPH%: 63.2 % — ABNORMAL HIGH (ref 14.0–49.0)
MCH: 28.1 pg (ref 27.2–33.4)
MCHC: 31.8 g/dL — ABNORMAL LOW (ref 32.0–36.0)
MCV: 88.2 fL (ref 79.3–98.0)
MONO#: 0.5 10*3/uL (ref 0.1–0.9)
MONO%: 4.2 % (ref 0.0–14.0)
NEUT#: 2.4 10*3/uL (ref 1.5–6.5)
NEUT%: 22.2 % — ABNORMAL LOW (ref 39.0–75.0)
NRBC: 3 % — AB (ref 0–0)
PLATELETS: 142 10*3/uL (ref 140–400)
RBC: 3.31 10*6/uL — AB (ref 4.20–5.82)
RDW: 18.4 % — AB (ref 11.0–14.6)
WBC: 10.9 10*3/uL — ABNORMAL HIGH (ref 4.0–10.3)
lymph#: 6.9 10*3/uL — ABNORMAL HIGH (ref 0.9–3.3)

## 2016-05-17 LAB — TECHNOLOGIST REVIEW

## 2016-05-17 MED ORDER — LEUPROLIDE ACETATE (3 MONTH) 22.5 MG IM KIT
22.5000 mg | PACK | Freq: Once | INTRAMUSCULAR | Status: AC
  Administered 2016-05-17: 22.5 mg via INTRAMUSCULAR
  Filled 2016-05-17: qty 22.5

## 2016-05-17 MED ORDER — DENOSUMAB 120 MG/1.7ML ~~LOC~~ SOLN
120.0000 mg | Freq: Once | SUBCUTANEOUS | Status: AC
  Administered 2016-05-17: 120 mg via SUBCUTANEOUS
  Filled 2016-05-17: qty 1.7

## 2016-05-17 MED ORDER — VITAMIN D (ERGOCALCIFEROL) 1.25 MG (50000 UNIT) PO CAPS: 50000.0000 [IU] | ORAL_CAPSULE | ORAL | 0 refills | Status: DC

## 2016-05-17 NOTE — Progress Notes (Unsigned)
Met with patient after chemo ed class to discuss grants. Patient verbally gave me his monthly income. Patient approved for both prostate grant(transportation and medication)-$200 and one-time [facility] grant $400 to assist with additional personal expenses. Patient has a copy of the award as well as the expense sheet. Patient states right now his medication cost are manageable and wishes to leave them at his pharmacy. Patient has my card for any additional financial questions or concerns. Patient received a gas card today from grant. Patient excorted to lobby to check in for further appointments.

## 2016-05-17 NOTE — Telephone Encounter (Signed)
Gave patient avs report and appointments for February thru April.  °

## 2016-05-17 NOTE — Patient Instructions (Signed)
Leuprolide depot injection What is this medicine? LEUPROLIDE (loo PROE lide) is a man-made protein that acts like a natural hormone in the body. It decreases testosterone in men and decreases estrogen in women. In men, this medicine is used to treat advanced prostate cancer. In women, some forms of this medicine may be used to treat endometriosis, uterine fibroids, or other male hormone-related problems. This medicine may be used for other purposes; ask your health care provider or pharmacist if you have questions. COMMON BRAND NAME(S): Eligard, Lupron Depot, Lupron Depot-Ped, Viadur What should I tell my health care provider before I take this medicine? They need to know if you have any of these conditions: -diabetes -heart disease or previous heart attack -high blood pressure -high cholesterol -mental illness -osteoporosis -pain or difficulty passing urine -seizures -spinal cord metastasis -stroke -suicidal thoughts, plans, or attempt; a previous suicide attempt by you or a family member -tobacco smoker -unusual vaginal bleeding (women) -an unusual or allergic reaction to leuprolide, benzyl alcohol, other medicines, foods, dyes, or preservatives -pregnant or trying to get pregnant -breast-feeding How should I use this medicine? This medicine is for injection into a muscle or for injection under the skin. It is given by a health care professional in a hospital or clinic setting. The specific product will determine how it will be given to you. Make sure you understand which product you receive and how often you will receive it. Talk to your pediatrician regarding the use of this medicine in children. Special care may be needed. Overdosage: If you think you have taken too much of this medicine contact a poison control center or emergency room at once. NOTE: This medicine is only for you. Do not share this medicine with others. What if I miss a dose? It is important not to miss a dose.  Call your doctor or health care professional if you are unable to keep an appointment. Depot injections: Depot injections are given either once-monthly, every 12 weeks, every 16 weeks, or every 24 weeks depending on the product you are prescribed. The product you are prescribed will be based on if you are male or male, and your condition. Make sure you understand your product and dosing. What may interact with this medicine? Do not take this medicine with any of the following medications: -chasteberry This medicine may also interact with the following medications: -herbal or dietary supplements, like black cohosh or DHEA -male hormones, like estrogens or progestins and birth control pills, patches, rings, or injections -male hormones, like testosterone This list may not describe all possible interactions. Give your health care provider a list of all the medicines, herbs, non-prescription drugs, or dietary supplements you use. Also tell them if you smoke, drink alcohol, or use illegal drugs. Some items may interact with your medicine. What should I watch for while using this medicine? Visit your doctor or health care professional for regular checks on your progress. During the first weeks of treatment, your symptoms may get worse, but then will improve as you continue your treatment. You may get hot flashes, increased bone pain, increased difficulty passing urine, or an aggravation of nerve symptoms. Discuss these effects with your doctor or health care professional, some of them may improve with continued use of this medicine. Male patients may experience a menstrual cycle or spotting during the first months of therapy with this medicine. If this continues, contact your doctor or health care professional. What side effects may I notice from receiving this medicine? Side   effects that you should report to your doctor or health care professional as soon as possible: -allergic reactions like skin  rash, itching or hives, swelling of the face, lips, or tongue -breathing problems -chest pain -depression or memory disorders -pain in your legs or groin -pain at site where injected or implanted -severe headache -swelling of the feet and legs -visual changes -vomiting Side effects that usually do not require medical attention (report to your doctor or health care professional if they continue or are bothersome): -breast swelling or tenderness -decrease in sex drive or performance -diarrhea -hot flashes -loss of appetite -muscle, joint, or bone pains -nausea -redness or irritation at site where injected or implanted -skin problems or acne This list may not describe all possible side effects. Call your doctor for medical advice about side effects. You may report side effects to FDA at 1-800-FDA-1088. Where should I keep my medicine? This drug is given in a hospital or clinic and will not be stored at home. NOTE: This sheet is a summary. It may not cover all possible information. If you have questions about this medicine, talk to your doctor, pharmacist, or health care provider.  2017 Elsevier/Gold Standard (2015-08-26 09:45:17) Denosumab injection What is this medicine? DENOSUMAB (den oh sue mab) slows bone breakdown. Prolia is used to treat osteoporosis in women after menopause and in men. Xgeva is used to prevent bone fractures and other bone problems caused by cancer bone metastases. Xgeva is also used to treat giant cell tumor of the bone. COMMON BRAND NAME(S): Prolia, XGEVA What should I tell my health care provider before I take this medicine? They need to know if you have any of these conditions: -dental disease -eczema -infection or history of infections -kidney disease or on dialysis -low blood calcium or vitamin D -malabsorption syndrome -scheduled to have surgery or tooth extraction -taking medicine that contains denosumab -thyroid or parathyroid disease -an unusual  reaction to denosumab, other medicines, foods, dyes, or preservatives -pregnant or trying to get pregnant -breast-feeding How should I use this medicine? This medicine is for injection under the skin. It is given by a health care professional in a hospital or clinic setting. If you are getting Prolia, a special MedGuide will be given to you by the pharmacist with each prescription and refill. Be sure to read this information carefully each time. For Prolia, talk to your pediatrician regarding the use of this medicine in children. Special care may be needed. For Xgeva, talk to your pediatrician regarding the use of this medicine in children. While this drug may be prescribed for children as young as 13 years for selected conditions, precautions do apply. What if I miss a dose? It is important not to miss your dose. Call your doctor or health care professional if you are unable to keep an appointment. What may interact with this medicine? Do not take this medicine with any of the following medications: -other medicines containing denosumab This medicine may also interact with the following medications: -medicines that suppress the immune system -medicines that treat cancer -steroid medicines like prednisone or cortisone What should I watch for while using this medicine? Visit your doctor or health care professional for regular checks on your progress. Your doctor or health care professional may order blood tests and other tests to see how you are doing. Call your doctor or health care professional if you get a cold or other infection while receiving this medicine. Do not treat yourself. This medicine may decrease your   body's ability to fight infection. You should make sure you get enough calcium and vitamin D while you are taking this medicine, unless your doctor tells you not to. Discuss the foods you eat and the vitamins you take with your health care professional. See your dentist regularly.  Brush and floss your teeth as directed. Before you have any dental work done, tell your dentist you are receiving this medicine. Do not become pregnant while taking this medicine or for 5 months after stopping it. Women should inform their doctor if they wish to become pregnant or think they might be pregnant. There is a potential for serious side effects to an unborn child. Talk to your health care professional or pharmacist for more information. What side effects may I notice from receiving this medicine? Side effects that you should report to your doctor or health care professional as soon as possible: -allergic reactions like skin rash, itching or hives, swelling of the face, lips, or tongue -breathing problems -chest pain -fast, irregular heartbeat -feeling faint or lightheaded, falls -fever, chills, or any other sign of infection -muscle spasms, tightening, or twitches -numbness or tingling -skin blisters or bumps, or is dry, peels, or red -slow healing or unexplained pain in the mouth or jaw -unusual bleeding or bruising Side effects that usually do not require medical attention (report to your doctor or health care professional if they continue or are bothersome): -muscle pain -stomach upset, gas Where should I keep my medicine? This medicine is only given in a clinic, doctor's office, or other health care setting and will not be stored at home.  2017 Elsevier/Gold Standard (2015-04-15 10:06:55)  

## 2016-05-17 NOTE — Patient Instructions (Signed)
-  Please ensure you are taking all your medications as per your medication list. -plz call my nurse for any help with medication management.

## 2016-05-19 ENCOUNTER — Ambulatory Visit: Payer: Medicare Other | Admitting: Nutrition

## 2016-05-19 ENCOUNTER — Encounter: Payer: Self-pay | Admitting: Oncology

## 2016-05-19 ENCOUNTER — Other Ambulatory Visit: Payer: Self-pay | Admitting: *Deleted

## 2016-05-19 ENCOUNTER — Ambulatory Visit (HOSPITAL_BASED_OUTPATIENT_CLINIC_OR_DEPARTMENT_OTHER): Payer: Medicare Other

## 2016-05-19 ENCOUNTER — Ambulatory Visit (HOSPITAL_BASED_OUTPATIENT_CLINIC_OR_DEPARTMENT_OTHER): Payer: Medicare Other | Admitting: Oncology

## 2016-05-19 ENCOUNTER — Other Ambulatory Visit: Payer: Medicare Other

## 2016-05-19 VITALS — BP 136/83 | HR 84 | Temp 98.1°F | Resp 18

## 2016-05-19 DIAGNOSIS — R000 Tachycardia, unspecified: Secondary | ICD-10-CM | POA: Insufficient documentation

## 2016-05-19 DIAGNOSIS — C61 Malignant neoplasm of prostate: Secondary | ICD-10-CM

## 2016-05-19 DIAGNOSIS — Z5111 Encounter for antineoplastic chemotherapy: Secondary | ICD-10-CM

## 2016-05-19 DIAGNOSIS — C7951 Secondary malignant neoplasm of bone: Secondary | ICD-10-CM

## 2016-05-19 MED ORDER — OXYCODONE-ACETAMINOPHEN 10-325 MG PO TABS: 1.0000 | ORAL_TABLET | ORAL | 0 refills | Status: DC | PRN

## 2016-05-19 MED ORDER — PEGFILGRASTIM 6 MG/0.6ML ~~LOC~~ PSKT
6.0000 mg | PREFILLED_SYRINGE | Freq: Once | SUBCUTANEOUS | Status: AC
  Administered 2016-05-19: 6 mg via SUBCUTANEOUS
  Filled 2016-05-19: qty 0.6

## 2016-05-19 MED ORDER — DOCETAXEL CHEMO INJECTION 160 MG/16ML
75.0000 mg/m2 | Freq: Once | INTRAVENOUS | Status: AC
  Administered 2016-05-19: 150 mg via INTRAVENOUS
  Filled 2016-05-19: qty 15

## 2016-05-19 MED ORDER — DEXAMETHASONE SODIUM PHOSPHATE 10 MG/ML IJ SOLN
10.0000 mg | Freq: Once | INTRAMUSCULAR | Status: AC
  Administered 2016-05-19: 10 mg via INTRAVENOUS

## 2016-05-19 MED ORDER — DEXAMETHASONE SODIUM PHOSPHATE 10 MG/ML IJ SOLN
INTRAMUSCULAR | Status: AC
  Filled 2016-05-19: qty 1

## 2016-05-19 MED ORDER — SODIUM CHLORIDE 0.9 % IV SOLN
Freq: Once | INTRAVENOUS | Status: AC
  Administered 2016-05-19: 10:00:00 via INTRAVENOUS

## 2016-05-19 NOTE — Progress Notes (Signed)
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 Irene Limbo.  Past medical history includes hiatal hernia   Medications include Decadron, Marinol, Zofran,Compazine, ergocalciferol 50,000 units per week for 12 weeks.  Labs include albumin 4.2 on February 12  Height: 6 feet 1 inch. Weight:183.6 pounds February 21 Usual body weight: 180 pounds. BMI: 24.22  Patient reports he did have history of anorexia and decreased oral intake.  He reports that since M.D. gave him vitamins he has been able to eat better. He is pleased with weight gain. He denies nutrition impact symptoms   Nutrition diagnosis:  Food and nutrition related knowledge deficit related to metastatic prostate cancer as evidenced by no prior need for nutrition related information.  Intervention: Educated patient to consume higher calorie, higher protein foods in small frequent meals and snacks to promote weight maintenance  Educated patient on foods with increased vitamin D and provided fact sheet  Encouraged patient to continue medications/suplements as ordered by physician  Questions were answered.  Teach back method used.  Contact information given   Monitoring, evaluation, goals: Patient will tolerate adequate calories and protein to promote weight maintenance.  Next visit: Patient will contact me for questions.  **Disclaimer: This note was dictated with voice recognition software. Similar sounding words can inadvertently be transcribed and this note may contain transcription errors which may not have been corrected upon publication of note.**

## 2016-05-19 NOTE — Patient Instructions (Signed)
[facility] Discharge Instructions for Patients Receiving Chemotherapy  Today you received the following chemotherapy agents Taxotere.  To help prevent nausea and vomiting after your treatment, we encourage you to take your nausea medication as prescribed by your doctor and as listed in this paperwork.  If you develop nausea and vomiting that is not controlled by your nausea medication, call the clinic.   BELOW ARE SYMPTOMS THAT SHOULD BE REPORTED IMMEDIATELY:  *FEVER GREATER THAN 100.5 F  *CHILLS WITH OR WITHOUT FEVER  NAUSEA AND VOMITING THAT IS NOT CONTROLLED WITH YOUR NAUSEA MEDICATION  *UNUSUAL SHORTNESS OF BREATH  *UNUSUAL BRUISING OR BLEEDING  TENDERNESS IN MOUTH AND THROAT WITH OR WITHOUT PRESENCE OF ULCERS  *URINARY PROBLEMS  *BOWEL PROBLEMS  UNUSUAL RASH Items with * indicate a potential emergency and should be followed up as soon as possible.  Feel free to call the clinic you have any questions or concerns. The clinic phone number is [phone].  Please show the [facility] at check-in to the Emergency Department and triage nurse.  Docetaxel injection What is this medicine? DOCETAXEL (doe se TAX el) is a chemotherapy drug. It targets fast dividing cells, like cancer cells, and causes these cells to die. This medicine is used to treat many types of cancers like breast cancer, certain stomach cancers, head and neck cancer, lung cancer, and prostate cancer. This medicine may be used for other purposes; ask your health care provider or pharmacist if you have questions. COMMON BRAND NAME(S): Docefrez, Taxotere What should I tell my health care provider before I take this medicine? They need to know if you have any of these conditions: -infection (especially a virus infection such as chickenpox, cold sores, or herpes) -liver disease -low blood counts, like low white cell, platelet, or red cell counts -an unusual or allergic reaction to docetaxel,  polysorbate 80, other chemotherapy agents, other medicines, foods, dyes, or preservatives -pregnant or trying to get pregnant -breast-feeding How should I use this medicine? This drug is given as an infusion into a vein. It is administered in a hospital or clinic by a specially trained health care professional. Talk to your pediatrician regarding the use of this medicine in children. Special care may be needed. Overdosage: If you think you have taken too much of this medicine contact a poison control center or emergency room at once. NOTE: This medicine is only for you. Do not share this medicine with others. What if I miss a dose? It is important not to miss your dose. Call your doctor or health care professional if you are unable to keep an appointment. What may interact with this medicine? -cyclosporine -erythromycin -ketoconazole -medicines to increase blood counts like filgrastim, pegfilgrastim, sargramostim -vaccines Talk to your doctor or health care professional before taking any of these medicines: -acetaminophen -aspirin -ibuprofen -ketoprofen -naproxen This list may not describe all possible interactions. Give your health care provider a list of all the medicines, herbs, non-prescription drugs, or dietary supplements you use. Also tell them if you smoke, drink alcohol, or use illegal drugs. Some items may interact with your medicine. What should I watch for while using this medicine? Your condition will be monitored carefully while you are receiving this medicine. You will need important blood work done while you are taking this medicine. This drug may make you feel generally unwell. This is not uncommon, as chemotherapy can affect healthy cells as well as cancer cells. Report any side effects. Continue your course of  treatment even though you feel ill unless your doctor tells you to stop. In some cases, you may be given additional medicines to help with side effects. Follow all  directions for their use. Call your doctor or health care professional for advice if you get a fever, chills or sore throat, or other symptoms of a cold or flu. Do not treat yourself. This drug decreases your body's ability to fight infections. Try to avoid being around people who are sick. This medicine may increase your risk to bruise or bleed. Call your doctor or health care professional if you notice any unusual bleeding. This medicine may contain alcohol in the product. You may get drowsy or dizzy. Do not drive, use machinery, or do anything that needs mental alertness until you know how this medicine affects you. Do not stand or sit up quickly, especially if you are an older patient. This reduces the risk of dizzy or fainting spells. Avoid alcoholic drinks. Do not become pregnant while taking this medicine. Women should inform their doctor if they wish to become pregnant or think they might be pregnant. There is a potential for serious side effects to an unborn child. Talk to your health care professional or pharmacist for more information. Do not breast-feed an infant while taking this medicine. What side effects may I notice from receiving this medicine? Side effects that you should report to your doctor or health care professional as soon as possible: -allergic reactions like skin rash, itching or hives, swelling of the face, lips, or tongue -low blood counts - This drug may decrease the number of white blood cells, red blood cells and platelets. You may be at increased risk for infections and bleeding. -signs of infection - fever or chills, cough, sore throat, pain or difficulty passing urine -signs of decreased platelets or bleeding - bruising, pinpoint red spots on the skin, black, tarry stools, nosebleeds -signs of decreased red blood cells - unusually weak or tired, fainting spells, lightheadedness -breathing problems -fast or irregular heartbeat -low blood pressure -mouth sores -nausea  and vomiting -pain, swelling, redness or irritation at the injection site -pain, tingling, numbness in the hands or feet -swelling of the ankle, feet, hands -weight gain Side effects that usually do not require medical attention (report to your doctor or health care professional if they continue or are bothersome): -bone pain -complete hair loss including hair on your head, underarms, pubic hair, eyebrows, and eyelashes -diarrhea -excessive tearing -changes in the color of fingernails -loosening of the fingernails -nausea -muscle pain -red flush to skin -sweating -weak or tired This list may not describe all possible side effects. Call your doctor for medical advice about side effects. You may report side effects to FDA at 1-800-FDA-1088. Where should I keep my medicine? This drug is given in a hospital or clinic and will not be stored at home. NOTE: This sheet is a summary. It may not cover all possible information. If you have questions about this medicine, talk to your doctor, pharmacist, or health care provider.  2017 Elsevier/Gold Standard (2015-04-15 12:32:56)

## 2016-05-19 NOTE — Progress Notes (Signed)
SYMPTOM MANAGEMENT CLINIC    Chief Complaint: Tachycardia  HPI:  Eric Osborn. 71 y.o. male diagnosed with  metastatic prostate cancer here for his first infusion of Taxotere. I was asked to see the patient by nursing and the infusion area due to tachycardia. The patient's baseline heart rate was in the 80s but during the infusion increased to the 120s and then back down into the 80s after the infusion was stopped. The patient was seen after his heart rate was back in the 80s and denied chest pain, shortness of breath, dyspnea on exertion. Denies anxiety. Denies abdominal pain, nausea, and vomiting. The patient states that he feels fine overall does not notice any new symptoms.   No history exists.    Review of Systems  Constitutional: Negative.   HENT: Negative.   Eyes: Negative.   Respiratory: Negative.   Cardiovascular: Negative for chest pain, palpitations, orthopnea, claudication, leg swelling and PND.       Tachycardia during the Taxol infusion  Gastrointestinal: Negative.   Genitourinary: Negative.   Musculoskeletal: Negative.   Skin: Negative.   Neurological: Negative.   Endo/Heme/Allergies: Negative.   Psychiatric/Behavioral: Negative.     Past Medical History:  Diagnosis Date  . Arthritis    left knee, left shoulder  . Erectile dysfunction 06/25/2012  . Foley catheter in place   . Goiter 06/25/2012  . H/O hiatal hernia   . Hematuria    occasional  . Hemorrhoid   . History of bladder infections   . Joint pain     Past Surgical History:  Procedure Laterality Date  . INGUINAL HERNIA REPAIR  8 yrs ago  . LYMPHADENECTOMY Bilateral 11/20/2012   Procedure: WITH BILATERAL PELVIC LYMPH NODE DISSECTION ;  Surgeon: [name], MD;  Location: [facility];  Service: Urology;  Laterality: Bilateral;  . ROBOT ASSISTED LAPAROSCOPIC RADICAL PROSTATECTOMY N/A 11/20/2012   Procedure: ROBOTIC ASSISTED LAPAROSCOPIC RADICAL PROSTATECTOMY;  Surgeon: [name], MD;  Location:  [facility];  Service: Urology;  Laterality: N/A;    has Goiter; Erectile dysfunction; Arthritis; Bone metastases ([place]); and Prostate cancer metastatic to multiple sites [facility]) on his problem list.    is allergic to poison ivy extract [poison ivy extract].  Allergies as of 05/19/2016      Reactions   Poison Ivy Extract [poison Ivy Extract] Nausea And Vomiting, Rash      Medication List       Accurate as of 05/19/16  2:35 PM. Always use your most recent med list.          bicalutamide 50 MG tablet Commonly known as:  CASODEX Take 1 tablet (50 mg total) by mouth daily.   dexamethasone 4 MG tablet Commonly known as:  DECADRON Take 2 tablets (8 mg total) by mouth 2 (two) times daily. Start the day before Taxotere. Then daily after chemo for 2 days.   dronabinol 5 MG capsule Commonly known as:  MARINOL Take 1 capsule (5 mg total) by mouth 2 (two) times daily before lunch and supper.   meloxicam 7.5 MG tablet Commonly known as:  MOBIC Take 7.5 mg by mouth every 4 (four) hours as needed for pain.   morphine 15 MG 12 hr tablet Commonly known as:  MS CONTIN Take 1 tablet (15 mg total) by mouth every 12 (twelve) hours.   ondansetron 8 MG tablet Commonly known as:  ZOFRAN Take 1 tablet (8 mg total) by mouth 2 (two) times daily as needed for  refractory nausea / vomiting.   oxyCODONE-acetaminophen 10-325 MG tablet Commonly known as:  PERCOCET Take 1 tablet by mouth every 4 (four) hours as needed for pain.   prochlorperazine 10 MG tablet Commonly known as:  COMPAZINE Take 1 tablet (10 mg total) by mouth every 6 (six) hours as needed (Nausea or vomiting).   Vitamin D (Ergocalciferol) 50000 units Caps capsule Commonly known as:  DRISDOL Take 1 capsule (50,000 Units total) by mouth every 7 (seven) days.        PHYSICAL EXAMINATION  Oncology Vitals 05/19/2016 05/17/2016  Height - 185 cm  Weight - 83.28 kg  Weight (lbs) - 183 lbs 10 oz  BMI (kg/m2) - 24.22 kg/m2  Temp 98.7 97.5    Pulse 84 92  Resp 18 18  SpO2 100 98  BSA (m2) - 2.07 m2   BP Readings from Last 2 Encounters:  05/19/16 113/78  05/17/16 120/63    Physical Exam  Constitutional: He is oriented to person, place, and time and well-developed, well-nourished, and in no distress. No distress.  HENT:  Head: Normocephalic and atraumatic.  Mouth/Throat: Oropharynx is clear and moist. No oropharyngeal exudate.  Eyes: Conjunctivae and EOM are normal. Pupils are equal, round, and reactive to light.  Neck: Normal range of motion. Neck supple. No tracheal deviation present. No thyromegaly present.  Cardiovascular: Normal rate and regular rhythm.   Pulmonary/Chest: Effort normal and breath sounds normal. No respiratory distress. He has no wheezes. He has no rales.  Abdominal: Soft. Bowel sounds are normal. There is no tenderness.  Musculoskeletal: Normal range of motion. He exhibits no edema.  Neurological: He is alert and oriented to person, place, and time.  Skin: Skin is warm and dry. He is not diaphoretic.  Psychiatric: Mood, memory, affect and judgment normal.  Vitals reviewed.   LABORATORY DATA:. Appointment on 05/17/2016  Component Date Value Ref Range Status  . WBC 05/17/2016 10.9* 4.0 - 10.3 10e3/uL Final  . NEUT# 05/17/2016 2.4  1.5 - 6.5 10e3/uL Final  . HGB 05/17/2016 9.3* 13.0 - 17.1 g/dL Final  . HCT 05/17/2016 29.2* 38.4 - 49.9 % Final  . Platelets 05/17/2016 142  140 - 400 10e3/uL Final  . MCV 05/17/2016 88.2  79.3 - 98.0 fL Final  . MCH 05/17/2016 28.1  27.2 - 33.4 pg Final  . MCHC 05/17/2016 31.8* 32.0 - 36.0 g/dL Final  . RBC 05/17/2016 3.31* 4.20 - 5.82 10e6/uL Final  . RDW 05/17/2016 18.4* 11.0 - 14.6 % Final  . lymph# 05/17/2016 6.9* 0.9 - 3.3 10e3/uL Final  . MONO# 05/17/2016 0.5  0.1 - 0.9 10e3/uL Final  . Eosinophils Absolute 05/17/2016 1.1* 0.0 - 0.5 10e3/uL Final  . Basophils Absolute 05/17/2016 0.0  0.0 - 0.1 10e3/uL Final  . NEUT% 05/17/2016 22.2* 39.0 - 75.0 % Final  .  LYMPH% 05/17/2016 63.2* 14.0 - 49.0 % Final  . MONO% 05/17/2016 4.2  0.0 - 14.0 % Final  . EOS% 05/17/2016 10.0* 0.0 - 7.0 % Final  . BASO% 05/17/2016 0.4  0.0 - 2.0 % Final  . nRBC 05/17/2016 3* 0 - 0 % Final  . Sodium 05/17/2016 139  136 - 145 mEq/L Final  . Potassium 05/17/2016 4.4  3.5 - 5.1 mEq/L Final  . Chloride 05/17/2016 106  98 - 109 mEq/L Final  . CO2 05/17/2016 26  22 - 29 mEq/L Final  . Glucose 05/17/2016 118  70 - 140 mg/dl Final  . BUN 05/17/2016 13.5  7.0 - 26.0  mg/dL Final  . Creatinine 05/17/2016 0.8  0.7 - 1.3 mg/dL Final  . Total Bilirubin 05/17/2016 0.50  0.20 - 1.20 mg/dL Final  . Alkaline Phosphatase 05/17/2016 2,368* 40 - 150 U/L Final  . AST 05/17/2016 31  5 - 34 U/L Final  . ALT 05/17/2016 9  0 - 55 U/L Final  . Total Protein 05/17/2016 6.7  6.4 - 8.3 g/dL Final  . Albumin 05/17/2016 3.9  3.5 - 5.0 g/dL Final  . Calcium 05/17/2016 9.7  8.4 - 10.4 mg/dL Final  . Anion Gap 05/17/2016 7  3 - 11 mEq/L Final  . EGFR 05/17/2016 >90  >90 ml/min/1.73 m2 Final  . Technologist Review 05/17/2016 Rare meta, occ variant lymph   Final    RADIOGRAPHIC STUDIES: No results found.  ASSESSMENT/PLAN:    No problem-specific Assessment & Plan notes found for this encounter.  This is a 70 year old gentleman with metastatic prostate cancer. He is here today to receive his first infusion of Taxotere. During the infusion, his heart rate increased to the 120s. Once the infusions stopped his heart rate came back down into the 80s. He was completely asymptomatic during this episode. The patient restarted his Taxol at a slightly lower rate with no changes in his vital signs. The Taxotere infusion was completed as ordered.  The patient will keep his follow-up as previously scheduled. He will contact us for any new symptoms.  Patient stated understanding of all instructions; and was in agreement with this plan of care. The patient knows to call the clinic with any problems, questions or  concerns.   Total time spent with patient was 15 minutes;  with greater than 50 percent of that time spent in face to face counseling regarding patient's symptoms,  and coordination of care and follow up.  Mikey Bussing, NP 05/19/2016

## 2016-05-19 NOTE — Progress Notes (Signed)
11:56 Patient's Pulse elevated to 120 and B/P 140/90. Pt asymptomatic. Altamese Dilling NP called and Taxotere stopped and fluids at 250 ml/hour. Patient was just finishing his Taxotere bump up of 163ml/hr for 50 ml's. Pt observed for 15 minutes. B/P at 127/82 and pulse 86. Orders to resumed Taxotere infusion at rate of 133 ml/hr for the remainder of the bag. Patient's vitals remained stable throughout the rest of the infusion.

## 2016-05-20 DIAGNOSIS — Z7189 Other specified counseling: Secondary | ICD-10-CM | POA: Insufficient documentation

## 2016-05-20 NOTE — Progress Notes (Signed)
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 is a wonderful 71 y.o. male who has been referred to [place] from the [facility] long [facility] ED by [name] PA-C for evaluation and management of metastatic malignancy concerning for metastatic prostate cancer.  Patient has had a h/o locally advanced prostate cancer diagnosed in 2014 and had a radical prostatectomy and pelvic LN dissection by Dr [name] in 10/2012. Pathology showed pT3b pN0 disease (with positive margins, extraprostatic extension, seminal vesicle involvement and angiolymphatic invasion). Bone scan was negative for metastatic disease. Patient report no post-op RT. He notes that he received lupron shots as per his urologist for 1 year post-operatively. He was being monitored by Dr [name] and last had clinical evaluation and PSA about 35months ago - PSA level not available in our system. He notes that he was lost to f/u since then.  Patient presented to the ED with worsening lower and middle back pain for 2 weeks. Also notes about 15-20lbs weight loss and anorexia over the last 6-8weeks. He also notes hematuria. No fevers/chills. Patient had a CT abd/pelvis in the ED which showed Innumerable sclerotic lesions in all imaged bones consistent metastatic disease. The prostate gland has an irregular appearance and the patient may have prostate carcinoma. A few enlarged retroperitoneal lymph nodes are identified worrisome for additional foci of metastatic disease.  Patient's pain was somewhat controlled with prn percocet in ED and he was discharged home with f/u with [place]. Patient notes that the patient wakes him up in the night.  No urinary retention. No loss of bowel or bladder control.  No new neurological symptoms in his extremities.   INTERVAL HISTORY  Patient is here to follow up on his imaging results and other workup for newly noted metastatic cancer. We discussed his imaging findings and lab results showing a significantly elevated PSA. We discussed that his presentation is consistent with metastatic prostate cancer. He has high-volume disease and we recommended doing docetaxel with G-CSF support in addition to ADT.  He has started taking casodex and shall be getting Lupron and Xgeva today. Pain better controlled with current pain medications. Has had chemo-counseling and has been scheduled to start Docetaxel from this Friday.  MEDICAL HISTORY:  Past Medical History:  Diagnosis Date  . Arthritis    left knee, left shoulder  . Erectile dysfunction 06/25/2012  . Foley catheter in place   . Goiter 06/25/2012  . H/O hiatal hernia   . Hematuria    occasional  . Hemorrhoid   . History of bladder infections   . Joint pain     SURGICAL HISTORY: Past Surgical History:  Procedure Laterality Date  . INGUINAL HERNIA REPAIR  8 yrs ago  . LYMPHADENECTOMY Bilateral 11/20/2012   Procedure: WITH BILATERAL PELVIC LYMPH NODE DISSECTION ;  Surgeon: [name], MD;  Location: [facility];  Service: Urology;  Laterality: Bilateral;  . ROBOT ASSISTED LAPAROSCOPIC RADICAL PROSTATECTOMY N/A 11/20/2012   Procedure: ROBOTIC ASSISTED LAPAROSCOPIC RADICAL PROSTATECTOMY;  Surgeon: [name], MD;  Location: [facility];  Service: Urology;  Laterality: N/A;    SOCIAL HISTORY: Social History   Social History  . Marital status: Divorced    Spouse name: N/A  . Number of children: N/A  .  Years of education: 9   Occupational History  . Retired     Tourist information centre manager   Social History Main Topics  . Smoking status: Former Smoker    Packs/day: 0.25    Years: 25.00    Types: Cigarettes  . Smokeless tobacco: Former Systems developer     Comment: quit date 2016  . Alcohol use 3.6 oz/week    6 Cans of beer per  week     Comment: occasional beer   . Drug use: Yes    Types: Marijuana     Comment: 3 x a month uses marijuana  . Sexual activity: Yes   Other Topics Concern  . Not on file   Social History Narrative   Regular exercise-no   Caffeine Use-yes    FAMILY HISTORY: Family History  Problem Relation Age of Onset  . Heart disease Sister   . Stroke Sister   . Colon cancer Paternal Uncle   . Diabetes Neg Hx     ALLERGIES:  is allergic to heparin and poison ivy extract [poison ivy extract].  MEDICATIONS:  Current Outpatient Prescriptions  Medication Sig Dispense Refill  . bicalutamide (CASODEX) 50 MG tablet Take 1 tablet (50 mg total) by mouth daily. 30 tablet 0  . dexamethasone (DECADRON) 4 MG tablet Take 2 tablets (8 mg total) by mouth 2 (two) times daily. Start the day before Taxotere. Then daily after chemo for 2 days. 30 tablet 1  . dronabinol (MARINOL) 5 MG capsule Take 1 capsule (5 mg total) by mouth 2 (two) times daily before lunch and supper. 60 capsule 0  . meloxicam (MOBIC) 7.5 MG tablet Take 7.5 mg by mouth every 4 (four) hours as needed for pain.    [name] morphine (MS CONTIN) 15 MG 12 hr tablet Take 1 tablet (15 mg total) by mouth every 12 (twelve) hours. 60 tablet 0  . ondansetron (ZOFRAN) 8 MG tablet Take 1 tablet (8 mg total) by mouth 2 (two) times daily as needed for refractory nausea / vomiting. 30 tablet 1  . oxyCODONE-acetaminophen (PERCOCET) 10-325 MG tablet Take 1 tablet by mouth every 4 (four) hours as needed for pain. 60 tablet 0  . prochlorperazine (COMPAZINE) 10 MG tablet Take 1 tablet (10 mg total) by mouth every 6 (six) hours as needed (Nausea or vomiting). 30 tablet 1  . Vitamin D, Ergocalciferol, (DRISDOL) 50000 units CAPS capsule Take 1 capsule (50,000 Units total) by mouth every 7 (seven) days. 12 capsule 0   No current facility-administered medications for this visit.     REVIEW OF SYSTEMS:    10 Point review of Systems was done is negative except as  noted above.  PHYSICAL EXAMINATION: ECOG PERFORMANCE STATUS: 2 - Symptomatic, <50% confined to bed  . Vitals:   05/17/16 1157  BP: 120/63  Pulse: 92  Resp: 18  Temp: 97.5 F (36.4 C)   Filed Weights   05/17/16 1157  Weight: 183 lb 9.6 oz (83.3 kg)   .Body mass index is 24.22 kg/m.   . Wt Readings from Last 3 Encounters:  05/17/16 183 lb 9.6 oz (83.3 kg)  05/08/16 182 lb 6.4 oz (82.7 kg)  05/01/13 176 lb 1.9 oz (79.9 kg)   GENERAL:alert, in no acute distress and comfortable SKIN: skin color, texture, turgor are normal, no rashes or significant lesions EYES: normal, conjunctiva are pink and non-injected, sclera clear OROPHARYNX:no exudate, no erythema and lips, buccal mucosa, and tongue normal  NECK: supple, no JVD, thyroid normal size, non-tender, without  nodularity LYMPH:  no palpable lymphadenopathy in the cervical, axillary or inguinal LUNGS: clear to auscultation with normal respiratory effort HEART: regular rate & rhythm,  no murmurs and no lower extremity edema ABDOMEN: abdomen soft, non-tender, normoactive bowel sounds  Musculoskeletal: no cyanosis of digits and no clubbing , tenderness to palpation over several areas over the central back. PSYCH: alert & oriented x 3 with fluent speech NEURO: no focal motor/sensory deficits  LABORATORY DATA:  I have reviewed the data as listed  . CBC Latest Ref Rng & Units 05/17/2016 05/08/2016 05/03/2016  WBC 4.0 - 10.3 10e3/uL 10.9(H) 11.3(H) 11.8(H)  Hemoglobin 13.0 - 17.1 g/dL 9.3(L) 10.5(L) 10.4(L)  Hematocrit 38.4 - 49.9 % 29.2(L) 32.4(L) 31.6(L)  Platelets 140 - 400 10e3/uL 142 141 131(L)    . CMP Latest Ref Rng & Units 05/17/2016 05/08/2016 05/03/2016  Glucose 70 - 140 mg/dl 118 96 109(H)  BUN 7.0 - 26.0 mg/dL 13.5 12.8 13  Creatinine 0.7 - 1.3 mg/dL 0.8 0.8 0.82  Sodium 136 - 145 mEq/L 139 138 138  Potassium 3.5 - 5.1 mEq/L 4.4 4.3 4.1  Chloride 101 - 111 mmol/L - - 105  CO2 22 - 29 mEq/L 26 26 26   Calcium 8.4 -  10.4 mg/dL 9.7 10.3 9.3  Total Protein 6.4 - 8.3 g/dL 6.7 7.5 7.2  Total Bilirubin 0.20 - 1.20 mg/dL 0.50 0.59 0.7  Alkaline Phos 40 - 150 U/L 2,368(H) 2,221(H) 1,667(H)  AST 5 - 34 U/L 31 34 33  ALT 0 - 55 U/L 9 8 9(L)   Component     Latest Ref Rng & Units 05/08/2016  Glucose     Negative mg/dL Negative  Bilirubin (Urine)     Negative Negative  Ketones     Negative mg/dL Negative  Specific Gravity, Urine     1.003 - 1.035 1.030  Blood     Negative Large  pH     4.6 - 8.0 6.0  Protein     Negative- <30 mg/dL 100  Urobilinogen, UR     0.2 - 1 mg/dL 0.2  Nitrite     Negative Negative  Leukocyte Esterase     Negative Negative  RBC / HPF     0 - 2 21-50  WBC, UA     0-2;Negative 0-2  Bacteria, UA     Negative- Trace Few  Epithelial Cells     Negative- Few Few  Mucus, UA     Negative- Small Small  Iron     42 - 163 ug/dL 119  TIBC     202 - 409 ug/dL 269  UIBC     117 - 376 ug/dL 149  %SAT     20 - 55 % 44  T3 Uptake Ratio     24 - 39 % 24  Free Thyroxine Index     1.2 - 4.9 1.7  Vitamin D, 25-Hydroxy     30.0 - 100.0 ng/mL 11.2 (L)  PSA     0.0 - 4.0 ng/mL 549.0 (H)  Ferritin     22 - 316 ng/ml 582 (H)  TSH     0.320 - 4.118 m(IU)/L 2.473  Thyroxine (T4)     4.5 - 12.0 ug/dL 7.2  T4,Free(Direct)     0.82 - 1.77 ng/dL 1.14       RADIOGRAPHIC STUDIES: I have personally reviewed the radiological images as listed and agreed with the findings in the report. Ct Chest W Contrast  Result Date: 05/12/2016  CLINICAL DATA:  Lung lesions. Probable prostate metastasis. History of prostate cancer in 2014. EXAM: CT CHEST WITH CONTRAST TECHNIQUE: Multidetector CT imaging of the chest was performed during intravenous contrast administration. CONTRAST:  83mL ISOVUE-300 IOPAMIDOL (ISOVUE-300) INJECTION 61% COMPARISON:  Abdominopelvic CT of 05/03/2016. Bone scan of 09/05/2012. FINDINGS: Cardiovascular: Aortic and branch vessel atherosclerosis. Tortuous thoracic aorta.  Moderate cardiomegaly. Multivessel coronary artery atherosclerosis. Pulmonary artery enlargement, including a 3.3 cm outflow tract. No central pulmonary embolism, on this non-dedicated study. Mediastinum/Nodes: Left-sided thyroid enlargement with heterogeneity and calcifications within. Left lobe measures on the order of 5.2 x 5.0 cm. No hilar adenopathy. No middle mediastinal adenopathy. Increased number of prevascular nodes. Example 10 mm node on image 67/series 2. Lungs/Pleura: Trace bilateral pleural thickening, without pleural fluid. Tracheal deviation to the right. Mild bibasilar scarring. No suspicious pulmonary nodule or mass. Upper Abdomen: Normal imaged portions of the spleen, stomach, adrenal glands, left kidney. Upper pole right renal cyst. Too small to characterize lesions within the liver. Prominent dorsal duct entering the duodenum, suggesting pancreas divisum. Musculoskeletal: Multiple small bilateral axillary nodes. None are pathologic by size criteria. Diffuse sclerotic osseous metastasis. Remote left clavicle fracture. Diffuse idiopathic skeletal hyperostosis. IMPRESSION: 1. Diffuse osseous metastasis, as on abdominopelvic CT. 2. No evidence of pulmonary metastasis. 3. Increased number and size of prevascular/anterior mediastinal nodes. Cannot exclude nodal metastasis. Increased number of small axillary nodes are nonspecific. 4. Pulmonary artery enlargement suggests pulmonary arterial hypertension. 5.  Coronary artery atherosclerosis. Aortic atherosclerosis. 6. 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 7. Left-sided thyroid enlargement and heterogeneity causing tracheal deviation right. This could be further evaluated with thyroid ultrasound. Electronically Signed   By: Abigail Miyamoto M.D.   On: 05/12/2016 09:56   Nm Bone Scan Whole Body  Result Date: 05/12/2016 CLINICAL DATA:  Metastatic prostate cancer. Initial staging. PSA 549 EXAM: NUCLEAR MEDICINE WHOLE BODY BONE SCAN TECHNIQUE: Whole body anterior and  posterior images were obtained approximately 3 hours after intravenous injection of radiopharmaceutical. RADIOPHARMACEUTICALS:  Twenty-two mCi Technetium-76m MDP IV COMPARISON:  09/05/2012 FINDINGS: Super scan appearance with no visible urinary activity. Axial and proximal appendicular skeleton shows confluent metastatic disease. Bilateral calvarial metastases. IMPRESSION: Confluent skeletal metastases in the axial and proximal appendicular skeleton. Bilateral calvarial metastases. Electronically Signed   By: Monte Fantasia M.D.   On: 05/12/2016 12:52   Ct Abdomen Pelvis W Contrast  Result Date: 05/03/2016 CLINICAL DATA:  Lower abdominal and back pain for 3 months which has recently worsened. Hematuria for 10 days. EXAM: CT ABDOMEN AND PELVIS WITH CONTRAST TECHNIQUE: Multidetector CT imaging of the abdomen and pelvis was performed using the standard protocol following bolus administration of intravenous contrast. CONTRAST:  100 ml ISOVUE-300 IOPAMIDOL (ISOVUE-300) INJECTION 61% COMPARISON:  CT pelvis 09/05/2012. FINDINGS: Lower chest: No pleural or pericardial effusion. Mild dependent atelectasis. Hepatobiliary: No focal liver abnormality is seen. No gallstones, gallbladder wall thickening, or biliary dilatation. Pancreas: No focal pancreatic lesion is identified. The pancreatic duct is mildly prominent measuring 0.3 cm. Spleen: Normal in size without focal abnormality. Adrenals/Urinary Tract: The adrenal glands appear normal. Low attenuating lesion lower pole right kidney measuring 2.3 cm is unchanged since the prior CT. A second small right renal cyst is also identified. The urinary bladder is largely decompressed. Stomach/Bowel: There is a segment of narrowing of the proximal to mid transverse colon measuring approximately 4.8 cm in length. The colon is otherwise unremarkable. Stomach and small bowel appear normal. Vascular/Lymphatic: Extensive atherosclerotic vascular disease is noted. A left periaortic  lymph node  on image 32 measures 1.3 cm short axis dimension. A left common iliac node on image 46 measures 1.2 cm short axis dimension. Multiple smaller retroperitoneal lymph nodes are identified. Reproductive: The prostate gland has an irregular superior border. The gland does not appear enlarged. It extends into the urinary bladder. Other: No fluid collection. Musculoskeletal: Innumerable sclerotic lesions are identified in all imaged bones. No fracture. 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 The prostate gland has an irregular appearance and the patient may have prostate carcinoma. A few enlarged retroperitoneal lymph nodes are identified worrisome for additional foci of metastatic disease. Thickening of the walls of the transverse colon measuring approximately 4.8 cm in length could be due to spasm or incomplete distention but cannot be definitively characterized. Recommend correlation with colon cancer screening. Mild prominence of the pancreatic duct without pancreatic lesion identified is likely incidental. Atherosclerosis. Electronically Signed   By: Inge Rise M.D.   On: 05/03/2016 15:33    ASSESSMENT & PLAN:   71 yo AAM with previous h/o of locally advanced pT3b,pN0 prostate cancer now with concern for newly noted metastatic prostate cancer.  1) Metastatic Prostate Cancer  PSA level 550 Patient is noted to have extensive osseous metastatic disease throughout the spine and bilaterally in the calvarium as well. Also noted to have local recurrence in the prostatic bed, retroperitoneal Lnadenoapathy and some mediastinal LNadenoapathy. No evidence of cord compression or focal neurological symptoms. This was qualify as high volume disease. ECOG PS 1 Previously locally advanced pT3b,pN0 diagnosed in 10/2012 s/p radical prostatectomy and ADT x 1 yr.  2) Elevated alkaline phosphatase level due to extensive bone mets.  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 Notes improved po intake with Marinol.  6) Vit D deficiency  7) Neoplasm related pain Plan -given high risk high volume disease plan to treat with docetaxel + ADT -patient had started casodex which we shall continue for about 1 month -will get lupron today -Xgeva for bony mets -Morphine CR BID for pain control with percocet prn for breakthrough pain. -senna-s bowel prophylaxis -Ergocalciferol 50k units weekly x 12 weeks. -baseline testosterone levels -pending -continue urology f/u with Dr [name] -continue on marinol BID for cancer related anorexia with weight loss. -dietician input. -completed chemo-counseling. Shall be start Docetaxel with G-CSF support from 05/19/2016  7) Colonic thickening in transverse colon thought to be related to spasm/incomplete distension of colon in this area. -will need colonoscopy at some point with PCP  Labs today CT chest and Whole body bone scan in next 2-3 days  Continue Lupron q3 months  Continue Xgeva q4weeks Docetaxel as per orders RTC with Dr Irene Limbo about 10-12 days for a toxicity check with labs  All of the patients questions were answered with apparent satisfaction. The patient knows to call the clinic with any problems, questions or concerns.  I spent 30 minutes counseling the patient face to face. The total time spent in the appointment was 40 minutes and more than 50% was on counseling and direct patient cares.    Sullivan Lone MD [place] AAHIVMS [facility] Hematology/Oncology Physician [facility]  (Office):       [phone] (Work cell):  [phone] (Fax):           [phone]

## 2016-06-02 ENCOUNTER — Other Ambulatory Visit (HOSPITAL_BASED_OUTPATIENT_CLINIC_OR_DEPARTMENT_OTHER): Payer: Medicare Other

## 2016-06-02 ENCOUNTER — Ambulatory Visit (HOSPITAL_BASED_OUTPATIENT_CLINIC_OR_DEPARTMENT_OTHER): Payer: Medicare Other | Admitting: Hematology

## 2016-06-02 ENCOUNTER — Encounter: Payer: Self-pay | Admitting: Hematology

## 2016-06-02 DIAGNOSIS — C799 Secondary malignant neoplasm of unspecified site: Secondary | ICD-10-CM

## 2016-06-02 DIAGNOSIS — R634 Abnormal weight loss: Secondary | ICD-10-CM | POA: Diagnosis not present

## 2016-06-02 DIAGNOSIS — C7951 Secondary malignant neoplasm of bone: Secondary | ICD-10-CM

## 2016-06-02 DIAGNOSIS — E559 Vitamin D deficiency, unspecified: Secondary | ICD-10-CM | POA: Diagnosis not present

## 2016-06-02 DIAGNOSIS — R748 Abnormal levels of other serum enzymes: Secondary | ICD-10-CM

## 2016-06-02 DIAGNOSIS — D649 Anemia, unspecified: Secondary | ICD-10-CM

## 2016-06-02 DIAGNOSIS — D630 Anemia in neoplastic disease: Secondary | ICD-10-CM | POA: Diagnosis not present

## 2016-06-02 DIAGNOSIS — R630 Anorexia: Secondary | ICD-10-CM

## 2016-06-02 DIAGNOSIS — C61 Malignant neoplasm of prostate: Secondary | ICD-10-CM | POA: Diagnosis not present

## 2016-06-02 DIAGNOSIS — G893 Neoplasm related pain (acute) (chronic): Secondary | ICD-10-CM | POA: Diagnosis not present

## 2016-06-02 LAB — CBC & DIFF AND RETIC
BASO%: 0.4 % (ref 0.0–2.0)
BASOS ABS: 0.1 10*3/uL (ref 0.0–0.1)
EOS%: 0.2 % (ref 0.0–7.0)
Eosinophils Absolute: 0 10*3/uL (ref 0.0–0.5)
HEMATOCRIT: 29.7 % — AB (ref 38.4–49.9)
HEMOGLOBIN: 9.7 g/dL — AB (ref 13.0–17.1)
Immature Retic Fract: 30.4 % — ABNORMAL HIGH (ref 3.00–10.60)
LYMPH%: 57.2 % — ABNORMAL HIGH (ref 14.0–49.0)
MCH: 28.1 pg (ref 27.2–33.4)
MCHC: 32.7 g/dL (ref 32.0–36.0)
MCV: 86.1 fL (ref 79.3–98.0)
MONO#: 0.5 10*3/uL (ref 0.1–0.9)
MONO%: 4.6 % (ref 0.0–14.0)
NEUT#: 4.5 10*3/uL (ref 1.5–6.5)
NEUT%: 37.6 % — ABNORMAL LOW (ref 39.0–75.0)
NRBC: 8 % — AB (ref 0–0)
Platelets: 252 10*3/uL (ref 140–400)
RBC: 3.45 10*6/uL — ABNORMAL LOW (ref 4.20–5.82)
RDW: 18.9 % — AB (ref 11.0–14.6)
Retic %: 2.97 % — ABNORMAL HIGH (ref 0.80–1.80)
Retic Ct Abs: 102.47 10*3/uL — ABNORMAL HIGH (ref 34.80–93.90)
WBC: 11.9 10*3/uL — ABNORMAL HIGH (ref 4.0–10.3)
lymph#: 6.8 10*3/uL — ABNORMAL HIGH (ref 0.9–3.3)

## 2016-06-02 LAB — COMPREHENSIVE METABOLIC PANEL
ALBUMIN: 3.4 g/dL — AB (ref 3.5–5.0)
ALT: 12 U/L (ref 0–55)
AST: 32 U/L (ref 5–34)
Anion Gap: 11 mEq/L (ref 3–11)
BUN: 11.3 mg/dL (ref 7.0–26.0)
CALCIUM: 6.2 mg/dL — AB (ref 8.4–10.4)
CO2: 23 mEq/L (ref 22–29)
Chloride: 101 mEq/L (ref 98–109)
Creatinine: 0.7 mg/dL (ref 0.7–1.3)
Glucose: 111 mg/dl (ref 70–140)
POTASSIUM: 3.3 meq/L — AB (ref 3.5–5.1)
Sodium: 135 mEq/L — ABNORMAL LOW (ref 136–145)
Total Bilirubin: 0.58 mg/dL (ref 0.20–1.20)
Total Protein: 6.9 g/dL (ref 6.4–8.3)

## 2016-06-02 MED ORDER — VITAMIN D (ERGOCALCIFEROL) 1.25 MG (50000 UNIT) PO CAPS: 50000.0000 [IU] | ORAL_CAPSULE | ORAL | 0 refills | Status: DC

## 2016-06-02 MED ORDER — DRONABINOL 10 MG PO CAPS: 10.0000 mg | ORAL_CAPSULE | Freq: Two times a day (BID) | ORAL | 0 refills | Status: DC

## 2016-06-02 NOTE — Patient Instructions (Signed)
-  Drink atleast 2 glasses of milk daily and increase intake of yogurt and cheese, dry fruits/nuts to have adequate calcium in your diet. -your potassium is a little low - please consume potassium rich foods like banana's , oranges , orange juice, tomato juice, coconut water, nuts.

## 2016-06-03 LAB — TESTOSTERONE: TESTOSTERONE: 7 ng/dL — AB (ref 264–916)

## 2016-06-04 NOTE — Progress Notes (Signed)
[name]    HEMATOLOGY/ONCOLOGY CLINIC NOTE  Date of Service: .06/02/2016  Patient Care Team: [name], NP as PCP - General (Internal Medicine)  CHIEF COMPLAINTS/PURPOSE OF CONSULTATION:  Multiple spinal bone mets concerning for metastatic prostate cancer  HISTORY OF PRESENTING ILLNESS:  Eric Osborn. is a wonderful 71 y.o. male who has been referred to [place] from the [facility] ED by [name] PA-C for evaluation and management of metastatic malignancy concerning for metastatic prostate cancer.  Patient has had a h/o locally advanced prostate cancer diagnosed in 2014 and had a radical prostatectomy and pelvic LN dissection by Dr [name] in 10/2012. Pathology showed pT3b pN0 disease (with positive margins, extraprostatic extension, seminal vesicle involvement and angiolymphatic invasion). Bone scan was negative for metastatic disease. Patient report no post-op RT. He notes that he received lupron shots as per his urologist for 1 year post-operatively. He was being monitored by Dr [name] and last had clinical evaluation and PSA about 85month ago - PSA level not available in our system. He notes that he was lost to f/u since then.  Patient presented to the ED with worsening lower and middle back pain for 2 weeks. Also notes about 15-20lbs weight loss and anorexia over the last 6-8weeks. He also notes hematuria. No fevers/chills. Patient had a CT abd/pelvis in the ED which showed Innumerable sclerotic lesions in all imaged bones consistent metastatic disease. The prostate gland has an irregular appearance and the patient may have prostate carcinoma. A few enlarged retroperitoneal lymph nodes are identified worrisome for additional foci of metastatic disease.  Patient's pain was somewhat controlled with prn percocet in ED and he was discharged home with f/u with [place] Patient notes that the patient wakes him up in the night.  No urinary retention. No loss of bowel or bladder control.  No new neurological symptoms in his extremities.   INTERVAL HISTORY  Patient is here to follow up after his first cycle of Taxotere with G-CSF support. Patient notes that he has some grade 1 fatigue. Mild anorexia wonders of his Marinol which was helping before could be increased. Note that his back pain and other bone pain has significantly improved and he is hardly needing any pain medications. My nurse and I spent time with him confirming that he is taking all his medications since he needs some help with medication management. No fevers or chills no night sweats. Has been drinking a lot of milk for additional calcium. His vitamin D dose was increased due to hypocalcemia and vitamin D deficiency.  MEDICAL HISTORY:  Past Medical History:  Diagnosis Date  . Arthritis    left knee, left shoulder  . Erectile dysfunction 06/25/2012  . Foley catheter in place   . Goiter 06/25/2012  . H/O hiatal hernia   . Hematuria    occasional  . Hemorrhoid   . History of bladder infections   . Joint pain     SURGICAL HISTORY: Past Surgical History:  Procedure Laterality Date  . INGUINAL HERNIA REPAIR  8 yrs ago  . LYMPHADENECTOMY Bilateral 11/20/2012   Procedure: WITH BILATERAL PELVIC LYMPH NODE DISSECTION ;  Surgeon: DBernestine Amass MD;  Location: [facility];  Service: Urology;  Laterality: Bilateral;  . ROBOT ASSISTED LAPAROSCOPIC RADICAL PROSTATECTOMY N/A 11/20/2012   Procedure: ROBOTIC ASSISTED LAPAROSCOPIC RADICAL PROSTATECTOMY;  Surgeon: DBernestine Amass MD;  Location: [facility];  Service: Urology;  Laterality: N/A;    SOCIAL HISTORY: Social History   Social History  .  Marital status: Divorced    Spouse name: N/A  . Number of children: N/A  . Years of education: 9   Occupational History  . Retired     Tourist information centre manager   Social History Main Topics  . Smoking status: Former Smoker    Packs/day: 0.25    Years: 25.00    Types: Cigarettes  . Smokeless tobacco: Former Systems developer     Comment: quit date 2016    . Alcohol use 3.6 oz/week    6 Cans of beer per week     Comment: occasional beer   . Drug use: Yes    Types: Marijuana     Comment: 3 x a month uses marijuana  . Sexual activity: Yes   Other Topics Concern  . Not on file   Social History Narrative   Regular exercise-no   Caffeine Use-yes    FAMILY HISTORY: Family History  Problem Relation Age of Onset  . Heart disease Sister   . Stroke Sister   . Colon cancer Paternal Uncle   . Diabetes Neg Hx     ALLERGIES:  is allergic to heparin and poison ivy extract [poison ivy extract].  MEDICATIONS:  Current Outpatient Prescriptions  Medication Sig Dispense Refill  . dexamethasone (DECADRON) 4 MG tablet Take 2 tablets (8 mg total) by mouth 2 (two) times daily. Start the day before Taxotere. Then daily after chemo for 2 days. 30 tablet 1  . dronabinol (MARINOL) 10 MG capsule Take 1 capsule (10 mg total) by mouth 2 (two) times daily before lunch and supper. 60 capsule 0  . ondansetron (ZOFRAN) 8 MG tablet Take 1 tablet (8 mg total) by mouth 2 (two) times daily as needed for refractory nausea / vomiting. 30 tablet 1  . oxyCODONE-acetaminophen (PERCOCET) 10-325 MG tablet Take 1 tablet by mouth every 4 (four) hours as needed for pain. 60 tablet 0  . prochlorperazine (COMPAZINE) 10 MG tablet Take 1 tablet (10 mg total) by mouth every 6 (six) hours as needed (Nausea or vomiting). 30 tablet 1  . [START ON 06/05/2016] Vitamin D, Ergocalciferol, (DRISDOL) 50000 units CAPS capsule Take 1 capsule (50,000 Units total) by mouth 2 (two) times a week. 24 capsule 0   No current facility-administered medications for this visit.     REVIEW OF SYSTEMS:    10 Point review of Systems was done is negative except as noted above.  PHYSICAL EXAMINATION: ECOG PERFORMANCE STATUS: 2 - Symptomatic, <50% confined to bed  . There were no vitals filed for this visit. There were no vitals filed for this visit. .There is no height or weight on file to  calculate BMI.   . Wt Readings from Last 3 Encounters:  05/17/16 183 lb 9.6 oz (83.3 kg)  05/08/16 182 lb 6.4 oz (82.7 kg)  05/01/13 176 lb 1.9 oz (79.9 kg)   GENERAL:alert, in no acute distress and comfortable SKIN: skin color, texture, turgor are normal, no rashes or significant lesions EYES: normal, conjunctiva are pink and non-injected, sclera clear OROPHARYNX:no exudate, no erythema and lips, buccal mucosa, and tongue normal  NECK: supple, no JVD, thyroid normal size, non-tender, without nodularity LYMPH:  no palpable lymphadenopathy in the cervical, axillary or inguinal LUNGS: clear to auscultation with normal respiratory effort HEART: regular rate & rhythm,  no murmurs and no lower extremity edema ABDOMEN: abdomen soft, non-tender, normoactive bowel sounds  Musculoskeletal: no cyanosis of digits and no clubbing , tenderness to palpation over several areas over the central back. PSYCH:  alert & oriented x 3 with fluent speech NEURO: no focal motor/sensory deficits  LABORATORY DATA:  I have reviewed the data as listed  . CBC Latest Ref Rng & Units 06/02/2016 05/17/2016 05/08/2016  WBC 4.0 - 10.3 10e3/uL 11.9(H) 10.9(H) 11.3(H)  Hemoglobin 13.0 - 17.1 g/dL 9.7(L) 9.3(L) 10.5(L)  Hematocrit 38.4 - 49.9 % 29.7(L) 29.2(L) 32.4(L)  Platelets 140 - 400 10e3/uL 252 142 141    . CMP Latest Ref Rng & Units 06/02/2016 05/17/2016 05/08/2016  Glucose 70 - 140 mg/dl 111 118 96  BUN 7.0 - 26.0 mg/dL 11.3 13.5 12.8  Creatinine 0.7 - 1.3 mg/dL 0.7 0.8 0.8  Sodium 136 - 145 mEq/L 135(L) 139 138  Potassium 3.5 - 5.1 mEq/L 3.3(L) 4.4 4.3  Chloride 101 - 111 mmol/L - - -  CO2 22 - 29 mEq/L _0 Calcium 8.4 - 10.4 mg/dL 6.2(LL) 9.7 10.3  Total Protein 6.4 - 8.3 g/dL 6.9 6.7 7.5  Total Bilirubin 0.20 - 1.20 mg/dL 0.58 0.50 0.59  Alkaline Phos 40 - 150 U/L 1,095(H) 2,368(H) 2,221(H)  AST 5 - 34 U/L 32 31 34  ALT 0 - 55 U/L _1 Component     Latest Ref Rng & Units 05/08/2016  Glucose      Negative mg/dL Negative  Bilirubin (Urine)     Negative Negative  Ketones     Negative mg/dL Negative  Specific Gravity, Urine     1.003 - 1.035 1.030  Blood     Negative Large  pH     4.6 - 8.0 6.0  Protein     Negative- <30 mg/dL 100  Urobilinogen, UR     0.2 - 1 mg/dL 0.2  Nitrite     Negative Negative  Leukocyte Esterase     Negative Negative  RBC / HPF     0 - 2 21-50  WBC, UA     0-2;Negative 0-2  Bacteria, UA     Negative- Trace Few  Epithelial Cells     Negative- Few Few  Mucus, UA     Negative- Small Small  Iron     42 - 163 ug/dL 119  TIBC     202 - 409 ug/dL 269  UIBC     117 - 376 ug/dL 149  %SAT     20 - 55 % 44  T3 Uptake Ratio     24 - 39 % 24  Free Thyroxine Index     1.2 - 4.9 1.7  Vitamin D, 25-Hydroxy     30.0 - 100.0 ng/mL 11.2 (L)  PSA     0.0 - 4.0 ng/mL 549.0 (H)  Ferritin     22 - 316 ng/ml 582 (H)  TSH     0.320 - 4.118 m(IU)/L 2.473  Thyroxine (T4)     4.5 - 12.0 ug/dL 7.2  T4,Free(Direct)     0.82 - 1.77 ng/dL 1.14       RADIOGRAPHIC STUDIES: I have personally reviewed the radiological images as listed and agreed with the findings in the report. Ct Chest W Contrast  Result Date: 05/12/2016 CLINICAL DATA:  Lung lesions. Probable prostate metastasis. History of prostate cancer in 2014. EXAM: CT CHEST WITH CONTRAST TECHNIQUE: Multidetector CT imaging of the chest was performed during intravenous contrast administration. CONTRAST:  59m ISOVUE-300 IOPAMIDOL (ISOVUE-300) INJECTION 61% COMPARISON:  Abdominopelvic CT of 05/03/2016. Bone scan of 09/05/2012. FINDINGS: Cardiovascular: Aortic and branch vessel atherosclerosis. Tortuous thoracic aorta. Moderate  cardiomegaly. Multivessel coronary artery atherosclerosis. Pulmonary artery enlargement, including a 3.3 cm outflow tract. No central pulmonary embolism, on this non-dedicated study. Mediastinum/Nodes: Left-sided thyroid enlargement with heterogeneity and calcifications within.  Left lobe measures on the order of 5.2 x 5.0 cm. No hilar adenopathy. No middle mediastinal adenopathy. Increased number of prevascular nodes. Example 10 mm node on image 67/series 2. Lungs/Pleura: Trace bilateral pleural thickening, without pleural fluid. Tracheal deviation to the right. Mild bibasilar scarring. No suspicious pulmonary nodule or mass. Upper Abdomen: Normal imaged portions of the spleen, stomach, adrenal glands, left kidney. Upper pole right renal cyst. Too small to characterize lesions within the liver. Prominent dorsal duct entering the duodenum, suggesting pancreas divisum. Musculoskeletal: Multiple small bilateral axillary nodes. None are pathologic by size criteria. Diffuse sclerotic osseous metastasis. Remote left clavicle fracture. Diffuse idiopathic skeletal hyperostosis. IMPRESSION: 1. Diffuse osseous metastasis, as on abdominopelvic CT. 2. No evidence of pulmonary metastasis. 3. Increased number and size of prevascular/anterior mediastinal nodes. Cannot exclude nodal metastasis. Increased number of small axillary nodes are nonspecific. 4. Pulmonary artery enlargement suggests pulmonary arterial hypertension. 5.  Coronary artery atherosclerosis. Aortic atherosclerosis. 6. 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 7. Left-sided thyroid enlargement and heterogeneity causing tracheal deviation right. This could be further evaluated with thyroid ultrasound. Electronically Signed   By: Abigail Miyamoto M.D.   On: 05/12/2016 09:56   Nm Bone Scan Whole Body  Result Date: 05/12/2016 CLINICAL DATA:  Metastatic prostate cancer. Initial staging. PSA 549 EXAM: NUCLEAR MEDICINE WHOLE BODY BONE SCAN TECHNIQUE: Whole body anterior and posterior images were obtained approximately 3 hours after intravenous injection of radiopharmaceutical. RADIOPHARMACEUTICALS:  Twenty-two mCi Technetium-84mMDP IV COMPARISON:  09/05/2012 FINDINGS: Super scan appearance with no visible urinary activity. Axial and proximal appendicular  skeleton shows confluent metastatic disease. Bilateral calvarial metastases. IMPRESSION: Confluent skeletal metastases in the axial and proximal appendicular skeleton. Bilateral calvarial metastases. Electronically Signed   By: JMonte FantasiaM.D.   On: 05/12/2016 12:52    ASSESSMENT & PLAN:   71yo AAM with previous h/o of locally advanced pT3b,pN0 prostate cancer now with concern for newly noted metastatic prostate cancer.  1) Metastatic Prostate Cancer  PSA level 550 Patient is noted to have extensive osseous metastatic disease throughout the spine and bilaterally in the calvarium as well. Also noted to have local recurrence in the prostatic bed, retroperitoneal Lnadenoapathy and some mediastinal LNadenoapathy. No evidence of cord compression or focal neurological symptoms. This was qualify as high volume disease. ECOG PS 1 Previously locally advanced pT3b,pN0 diagnosed in 10/2012 s/p radical prostatectomy and ADT x 1 yr. Testosterone level 7 -- if dropping appropriately with treatment.   2) Elevated alkaline phosphatase level due to extensive bone mets. Improving with treatment.  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  5) Anorexia with weight loss of about 15-20lbs. Notes improved po intake with Marinol. Dose increased due to persistent anorexia which worsened after chemotherapy.  6) Vit D deficiency with hypocalcemia -Being aggressively replaced since his calcemia levels have dropped after Xgeva likely reflecting significant bone calcium deficits from extensive healing bone lesions. ALK PO4 has dropped by 50%  7) Neoplasm related pain - much improved patient is has not needed much of his prescribed pain medications Plan -no prohibitive toxicities from treatment thus far -continue with docetaxel + ADT (will need to rpt labs to check calcium levels and if low might need IV  Calcium replacement) -patient has nearly completed his planned 1 month of casodex -- will be discontinued -continue depot Lupron q75month -Xgeva for bony mets -replacing Vit D aggressively due to significant deficiency and hypocalcemia. -patient is drink 2-3 glasses of milk daily and should be getting adequate calcium -recommended to increase po potassium intake for mild hypokalemia -pain well controlled and patient not needing his MS contin -- we will recommend for him to discontinue this as possible -continue percocet prn for breakthrough pain. -senna-s bowel prophylaxis -Ergocalciferol 50k units 2 times  weekly x 12 weeks. -continue urology f/u with Dr GRisa Grill-continue on marinol BID for cancer related anorexia with weight loss. Dose increased to 129mpo BID -dietician input.  7) Colonic thickening in transverse colon thought to be related to spasm/incomplete distension of colon in this area. -will need colonoscopy at some point with PCP  -continue Chemotherapy as per schedule -continue Xgeva q4weeks -continue Lupron q3 months -RTC with Dr KaIrene [id number] of chemotherapy with labs  All of the patients questions were answered with apparent satisfaction. The patient knows to call the clinic with any problems, questions or concerns.  I spent 25 minutes counseling the patient face to face. The total time spent in the appointment was 30 minutes and more than 50% was on counseling and direct patient cares.    GaSullivan LoneD MSKings GrantAHIVMS SCEncompass Health Rehabilitation HospitalTMaryland Surgery Centerematology/Oncology Physician CoCentral Valley General Hospital(Office):       [phone] cell):  [phone]):           [phone]

## 2016-06-09 ENCOUNTER — Ambulatory Visit (HOSPITAL_BASED_OUTPATIENT_CLINIC_OR_DEPARTMENT_OTHER): Payer: Medicare Other

## 2016-06-09 ENCOUNTER — Other Ambulatory Visit (HOSPITAL_BASED_OUTPATIENT_CLINIC_OR_DEPARTMENT_OTHER): Payer: Medicare Other

## 2016-06-09 VITALS — BP 111/63 | HR 86 | Temp 98.4°F | Resp 18

## 2016-06-09 DIAGNOSIS — C7951 Secondary malignant neoplasm of bone: Secondary | ICD-10-CM | POA: Diagnosis not present

## 2016-06-09 DIAGNOSIS — Z7189 Other specified counseling: Secondary | ICD-10-CM

## 2016-06-09 DIAGNOSIS — Z5111 Encounter for antineoplastic chemotherapy: Secondary | ICD-10-CM

## 2016-06-09 DIAGNOSIS — C61 Malignant neoplasm of prostate: Secondary | ICD-10-CM | POA: Diagnosis present

## 2016-06-09 LAB — CBC WITH DIFFERENTIAL/PLATELET
BASO%: 0.5 % (ref 0.0–2.0)
BASOS ABS: 0.1 10*3/uL (ref 0.0–0.1)
EOS ABS: 0 10*3/uL (ref 0.0–0.5)
EOS%: 0.3 % (ref 0.0–7.0)
HCT: 25.8 % — ABNORMAL LOW (ref 38.4–49.9)
HGB: 8.2 g/dL — ABNORMAL LOW (ref 13.0–17.1)
LYMPH%: 57.3 % — AB (ref 14.0–49.0)
MCH: 28.5 pg (ref 27.2–33.4)
MCHC: 31.8 g/dL — AB (ref 32.0–36.0)
MCV: 89.6 fL (ref 79.3–98.0)
MONO#: 0.7 10*3/uL (ref 0.1–0.9)
MONO%: 7.2 % (ref 0.0–14.0)
NEUT#: 3.4 10*3/uL (ref 1.5–6.5)
NEUT%: 34.7 % — ABNORMAL LOW (ref 39.0–75.0)
PLATELETS: 277 10*3/uL (ref 140–400)
RBC: 2.88 10*6/uL — ABNORMAL LOW (ref 4.20–5.82)
RDW: 21 % — ABNORMAL HIGH (ref 11.0–14.6)
WBC: 9.9 10*3/uL (ref 4.0–10.3)
lymph#: 5.7 10*3/uL — ABNORMAL HIGH (ref 0.9–3.3)
nRBC: 7 % — ABNORMAL HIGH (ref 0–0)

## 2016-06-09 LAB — COMPREHENSIVE METABOLIC PANEL
ALT: 16 U/L (ref 0–55)
ANION GAP: 5 meq/L (ref 3–11)
AST: 31 U/L (ref 5–34)
Albumin: 3.4 g/dL — ABNORMAL LOW (ref 3.5–5.0)
Alkaline Phosphatase: 2189 U/L — ABNORMAL HIGH (ref 40–150)
BUN: 12.6 mg/dL (ref 7.0–26.0)
CHLORIDE: 110 meq/L — AB (ref 98–109)
CO2: 25 mEq/L (ref 22–29)
Calcium: 7.8 mg/dL — ABNORMAL LOW (ref 8.4–10.4)
Creatinine: 0.7 mg/dL (ref 0.7–1.3)
Glucose: 93 mg/dl (ref 70–140)
Potassium: 4.8 mEq/L (ref 3.5–5.1)
Sodium: 139 mEq/L (ref 136–145)
Total Bilirubin: 0.32 mg/dL (ref 0.20–1.20)
Total Protein: 6.4 g/dL (ref 6.4–8.3)

## 2016-06-09 MED ORDER — DEXAMETHASONE SODIUM PHOSPHATE 10 MG/ML IJ SOLN
10.0000 mg | Freq: Once | INTRAMUSCULAR | Status: AC
  Administered 2016-06-09: 10 mg via INTRAVENOUS

## 2016-06-09 MED ORDER — DOCETAXEL CHEMO INJECTION 160 MG/16ML
75.0000 mg/m2 | Freq: Once | INTRAVENOUS | Status: AC
  Administered 2016-06-09: 150 mg via INTRAVENOUS
  Filled 2016-06-09: qty 15

## 2016-06-09 MED ORDER — DEXAMETHASONE SODIUM PHOSPHATE 10 MG/ML IJ SOLN
INTRAMUSCULAR | Status: AC
  Filled 2016-06-09: qty 1

## 2016-06-09 MED ORDER — PEGFILGRASTIM 6 MG/0.6ML ~~LOC~~ PSKT
6.0000 mg | PREFILLED_SYRINGE | Freq: Once | SUBCUTANEOUS | Status: AC
  Administered 2016-06-09: 6 mg via SUBCUTANEOUS
  Filled 2016-06-09: qty 0.6

## 2016-06-09 MED ORDER — SODIUM CHLORIDE 0.9 % IV SOLN
Freq: Once | INTRAVENOUS | Status: AC
  Administered 2016-06-09: 14:00:00 via INTRAVENOUS

## 2016-06-09 NOTE — Patient Instructions (Signed)
Cancer Center Discharge Instructions for Patients Receiving Chemotherapy  Today you received the following chemotherapy agents Taxotere.  To help prevent nausea and vomiting after your treatment, we encourage you to take your nausea medication as prescribed.   If you develop nausea and vomiting that is not controlled by your nausea medication, call the clinic.   BELOW ARE SYMPTOMS THAT SHOULD BE REPORTED IMMEDIATELY:  *FEVER GREATER THAN 100.5 F  *CHILLS WITH OR WITHOUT FEVER  NAUSEA AND VOMITING THAT IS NOT CONTROLLED WITH YOUR NAUSEA MEDICATION  *UNUSUAL SHORTNESS OF BREATH  *UNUSUAL BRUISING OR BLEEDING  TENDERNESS IN MOUTH AND THROAT WITH OR WITHOUT PRESENCE OF ULCERS  *URINARY PROBLEMS  *BOWEL PROBLEMS  UNUSUAL RASH Items with * indicate a potential emergency and should be followed up as soon as possible.  Feel free to call the clinic you have any questions or concerns. The clinic phone number is [phone].  Please show the CHEMO ALERT CARD at check-in to the Emergency Department and triage nurse.   

## 2016-06-14 ENCOUNTER — Ambulatory Visit: Payer: Medicare Other

## 2016-06-14 NOTE — Progress Notes (Signed)
Pt unable to receive Xgeva today per Pharmacy, Calcium 7.8, pt to be taking Calcium supplement (500 mg twice a day) at home. Darden Dates RN aware and will f/u with Dr. Irene Limbo, and contact patient with further instructions. Pt aware and verbalizes understanding. Pt has no further complaints at this time. Pt stable at discharge.

## 2016-06-15 ENCOUNTER — Telehealth: Payer: Self-pay | Admitting: Hematology

## 2016-06-15 NOTE — Telephone Encounter (Signed)
Patient aware of new appts scheduled. Will pick up new schedule when he comes in on 4/6

## 2016-06-30 ENCOUNTER — Ambulatory Visit (HOSPITAL_BASED_OUTPATIENT_CLINIC_OR_DEPARTMENT_OTHER): Payer: Medicare Other

## 2016-06-30 ENCOUNTER — Other Ambulatory Visit: Payer: Self-pay | Admitting: Nurse Practitioner

## 2016-06-30 ENCOUNTER — Other Ambulatory Visit: Payer: Self-pay | Admitting: *Deleted

## 2016-06-30 ENCOUNTER — Other Ambulatory Visit (HOSPITAL_BASED_OUTPATIENT_CLINIC_OR_DEPARTMENT_OTHER): Payer: Medicare Other

## 2016-06-30 VITALS — BP 126/74 | HR 73 | Temp 97.8°F | Resp 16

## 2016-06-30 DIAGNOSIS — C7951 Secondary malignant neoplasm of bone: Secondary | ICD-10-CM

## 2016-06-30 DIAGNOSIS — C61 Malignant neoplasm of prostate: Secondary | ICD-10-CM

## 2016-06-30 DIAGNOSIS — Z7189 Other specified counseling: Secondary | ICD-10-CM

## 2016-06-30 DIAGNOSIS — Z5111 Encounter for antineoplastic chemotherapy: Secondary | ICD-10-CM | POA: Diagnosis present

## 2016-06-30 LAB — CBC WITH DIFFERENTIAL/PLATELET
BASO%: 0.8 % (ref 0.0–2.0)
Basophils Absolute: 0.1 10*3/uL (ref 0.0–0.1)
EOS ABS: 0 10*3/uL (ref 0.0–0.5)
EOS%: 0.2 % (ref 0.0–7.0)
HCT: 26.2 % — ABNORMAL LOW (ref 38.4–49.9)
HGB: 7.9 g/dL — ABNORMAL LOW (ref 13.0–17.1)
LYMPH%: 46.2 % (ref 14.0–49.0)
MCH: 28.6 pg (ref 27.2–33.4)
MCHC: 30.2 g/dL — ABNORMAL LOW (ref 32.0–36.0)
MCV: 94.9 fL (ref 79.3–98.0)
MONO#: 1.2 10*3/uL — ABNORMAL HIGH (ref 0.1–0.9)
MONO%: 12.2 % (ref 0.0–14.0)
NEUT%: 40.6 % (ref 39.0–75.0)
NEUTROS ABS: 4 10*3/uL (ref 1.5–6.5)
PLATELETS: 237 10*3/uL (ref 140–400)
RBC: 2.76 10*6/uL — AB (ref 4.20–5.82)
RDW: 24 % — AB (ref 11.0–14.6)
WBC: 9.9 10*3/uL (ref 4.0–10.3)
lymph#: 4.6 10*3/uL — ABNORMAL HIGH (ref 0.9–3.3)
nRBC: 5 % — ABNORMAL HIGH (ref 0–0)

## 2016-06-30 LAB — COMPREHENSIVE METABOLIC PANEL
ALT: 19 U/L (ref 0–55)
AST: 29 U/L (ref 5–34)
Albumin: 3.7 g/dL (ref 3.5–5.0)
Alkaline Phosphatase: 1159 U/L — ABNORMAL HIGH (ref 40–150)
Anion Gap: 7 mEq/L (ref 3–11)
BILIRUBIN TOTAL: 0.32 mg/dL (ref 0.20–1.20)
BUN: 9.1 mg/dL (ref 7.0–26.0)
CO2: 21 meq/L — AB (ref 22–29)
CREATININE: 0.6 mg/dL — AB (ref 0.7–1.3)
Calcium: 7.9 mg/dL — ABNORMAL LOW (ref 8.4–10.4)
Chloride: 112 mEq/L — ABNORMAL HIGH (ref 98–109)
EGFR: >90 mL/min/{1.73_m2} (ref >90)
GLUCOSE: 100 mg/dL (ref 70–140)
Potassium: 4.5 mEq/L (ref 3.5–5.1)
SODIUM: 140 meq/L (ref 136–145)
TOTAL PROTEIN: 6.3 g/dL — AB (ref 6.4–8.3)

## 2016-06-30 LAB — TECHNOLOGIST REVIEW

## 2016-06-30 MED ORDER — SODIUM CHLORIDE 0.9 % IV SOLN
Freq: Once | INTRAVENOUS | Status: AC
  Administered 2016-06-30: 14:00:00 via INTRAVENOUS

## 2016-06-30 MED ORDER — PEGFILGRASTIM 6 MG/0.6ML ~~LOC~~ PSKT
6.0000 mg | PREFILLED_SYRINGE | Freq: Once | SUBCUTANEOUS | Status: AC
  Administered 2016-06-30: 6 mg via SUBCUTANEOUS
  Filled 2016-06-30: qty 0.6

## 2016-06-30 MED ORDER — DEXAMETHASONE SODIUM PHOSPHATE 10 MG/ML IJ SOLN
INTRAMUSCULAR | Status: AC
  Filled 2016-06-30: qty 1

## 2016-06-30 MED ORDER — DEXAMETHASONE SODIUM PHOSPHATE 10 MG/ML IJ SOLN
10.0000 mg | Freq: Once | INTRAMUSCULAR | Status: AC
  Administered 2016-06-30: 10 mg via INTRAVENOUS

## 2016-06-30 MED ORDER — ACETAMINOPHEN 325 MG PO TABS
ORAL_TABLET | ORAL | Status: AC
  Filled 2016-06-30: qty 2

## 2016-06-30 MED ORDER — DOCETAXEL CHEMO INJECTION 160 MG/16ML
75.0000 mg/m2 | Freq: Once | INTRAVENOUS | Status: AC
  Administered 2016-06-30: 150 mg via INTRAVENOUS
  Filled 2016-06-30: qty 15

## 2016-06-30 MED ORDER — OXYCODONE-ACETAMINOPHEN 10-325 MG PO TABS: 1.0000 | ORAL_TABLET | ORAL | 0 refills | Status: DC | PRN

## 2016-06-30 NOTE — Progress Notes (Signed)
Patient's HGB is 7.9.  He is asymptomatic.  He denies headache, heart palpitations, fatigue and SOB.  He did not do valet parking today and walked from the parking lot up the hill with no problems.  Discussed with Dr. Alvy Bimler in absence of Dr. Irene Limbo and ok to treat today and notify Dr. Grier Mitts nurse to put in type and hold for next visit on 07/12/16.  This is also agreeable with patient.

## 2016-06-30 NOTE — Patient Instructions (Signed)
[facility] Discharge Instructions for Patients Receiving Chemotherapy  Today you received the following chemotherapy agents Taxotere.  To help prevent nausea and vomiting after your treatment, we encourage you to take your nausea medication as prescribed.   If you develop nausea and vomiting that is not controlled by your nausea medication, call the clinic.   BELOW ARE SYMPTOMS THAT SHOULD BE REPORTED IMMEDIATELY:  *FEVER GREATER THAN 100.5 F  *CHILLS WITH OR WITHOUT FEVER  NAUSEA AND VOMITING THAT IS NOT CONTROLLED WITH YOUR NAUSEA MEDICATION  *UNUSUAL SHORTNESS OF BREATH  *UNUSUAL BRUISING OR BLEEDING  TENDERNESS IN MOUTH AND THROAT WITH OR WITHOUT PRESENCE OF ULCERS  *URINARY PROBLEMS  *BOWEL PROBLEMS  UNUSUAL RASH Items with * indicate a potential emergency and should be followed up as soon as possible.  Feel free to call the clinic you have any questions or concerns. The clinic phone number is [phone].  Please show the CHEMO ALERT CARD at check-in to the Emergency Department and triage nurse.   

## 2016-07-05 ENCOUNTER — Telehealth: Payer: Self-pay | Admitting: *Deleted

## 2016-07-05 NOTE — Telephone Encounter (Signed)
-----   Message from [name], MD sent at 07/05/2016 12:04 AM EDT ----- Delle Reining, Could you call and see how Mr Selena Batten is doing and see if he is symptomatic from his anemia and might need a PRBC transfusion. Also plz add type and screen to his followup labs on 07/12/2016 unless he needs transfusion prior to that based on symptoms. Thanks [place]

## 2016-07-05 NOTE — Telephone Encounter (Signed)
Per staff message, called pt to check on status.  Pt states he is "a little weak" and sometimes "a little SOB" but no worse than usual.  Pt states he is still able to preform ADL's and other activities with short rest breaks.  Pt stated he does not feel he needs to have labs rechecked at this time. Instructed pt that if symptoms become worse to call clinic. Pt verbalized understanding. Confirmed apts on 4/18 with pt.  Pt thankful for call.

## 2016-07-12 ENCOUNTER — Other Ambulatory Visit: Payer: Self-pay | Admitting: *Deleted

## 2016-07-12 ENCOUNTER — Ambulatory Visit (HOSPITAL_BASED_OUTPATIENT_CLINIC_OR_DEPARTMENT_OTHER): Payer: Medicare Other

## 2016-07-12 ENCOUNTER — Other Ambulatory Visit (HOSPITAL_BASED_OUTPATIENT_CLINIC_OR_DEPARTMENT_OTHER): Payer: Medicare Other

## 2016-07-12 ENCOUNTER — Ambulatory Visit: Payer: Medicare Other

## 2016-07-12 ENCOUNTER — Ambulatory Visit (HOSPITAL_COMMUNITY)
Admission: RE | Admit: 2016-07-12 | Discharge: 2016-07-12 | Disposition: A | Discharge status: Home / Self Care | Payer: Medicare Other | Source: Ambulatory Visit | Attending: Hematology | Admitting: Hematology

## 2016-07-12 ENCOUNTER — Other Ambulatory Visit: Payer: Medicare Other

## 2016-07-12 ENCOUNTER — Encounter: Payer: Self-pay | Admitting: Hematology

## 2016-07-12 ENCOUNTER — Other Ambulatory Visit: Payer: Self-pay | Admitting: Hematology

## 2016-07-12 ENCOUNTER — Ambulatory Visit (HOSPITAL_BASED_OUTPATIENT_CLINIC_OR_DEPARTMENT_OTHER): Payer: Medicare Other | Admitting: Hematology

## 2016-07-12 VITALS — BP 115/61 | HR 86 | Temp 97.6°F | Resp 18 | Wt 177.1 lb

## 2016-07-12 DIAGNOSIS — D6481 Anemia due to antineoplastic chemotherapy: Secondary | ICD-10-CM | POA: Diagnosis not present

## 2016-07-12 DIAGNOSIS — D649 Anemia, unspecified: Secondary | ICD-10-CM

## 2016-07-12 DIAGNOSIS — R630 Anorexia: Secondary | ICD-10-CM | POA: Diagnosis not present

## 2016-07-12 DIAGNOSIS — C61 Malignant neoplasm of prostate: Secondary | ICD-10-CM | POA: Diagnosis not present

## 2016-07-12 DIAGNOSIS — G893 Neoplasm related pain (acute) (chronic): Secondary | ICD-10-CM | POA: Diagnosis not present

## 2016-07-12 DIAGNOSIS — R634 Abnormal weight loss: Secondary | ICD-10-CM

## 2016-07-12 DIAGNOSIS — C7951 Secondary malignant neoplasm of bone: Secondary | ICD-10-CM

## 2016-07-12 DIAGNOSIS — Z7189 Other specified counseling: Secondary | ICD-10-CM

## 2016-07-12 DIAGNOSIS — K0889 Other specified disorders of teeth and supporting structures: Secondary | ICD-10-CM

## 2016-07-12 DIAGNOSIS — K6389 Other specified diseases of intestine: Secondary | ICD-10-CM | POA: Diagnosis not present

## 2016-07-12 DIAGNOSIS — E559 Vitamin D deficiency, unspecified: Secondary | ICD-10-CM

## 2016-07-12 DIAGNOSIS — R748 Abnormal levels of other serum enzymes: Secondary | ICD-10-CM

## 2016-07-12 LAB — CBC WITH DIFFERENTIAL/PLATELET
BASO%: 0.4 % (ref 0.0–2.0)
BASOS ABS: 0 10*3/uL (ref 0.0–0.1)
EOS ABS: 0.1 10*3/uL (ref 0.0–0.5)
EOS%: 1.3 % (ref 0.0–7.0)
HEMATOCRIT: 26.9 % — AB (ref 38.4–49.9)
HEMOGLOBIN: 8.1 g/dL — AB (ref 13.0–17.1)
LYMPH%: 70.3 % — ABNORMAL HIGH (ref 14.0–49.0)
MCH: 28.6 pg (ref 27.2–33.4)
MCHC: 30.1 g/dL — ABNORMAL LOW (ref 32.0–36.0)
MCV: 95.1 fL (ref 79.3–98.0)
MONO#: 0.7 10*3/uL (ref 0.1–0.9)
MONO%: 12.3 % (ref 0.0–14.0)
NEUT#: 0.9 10*3/uL — ABNORMAL LOW (ref 1.5–6.5)
NEUT%: 15.7 % — ABNORMAL LOW (ref 39.0–75.0)
PLATELETS: 298 10*3/uL (ref 140–400)
RBC: 2.83 10*6/uL — ABNORMAL LOW (ref 4.20–5.82)
RDW: 22.2 % — ABNORMAL HIGH (ref 11.0–14.6)
WBC: 5.4 10*3/uL (ref 4.0–10.3)
lymph#: 3.8 10*3/uL — ABNORMAL HIGH (ref 0.9–3.3)

## 2016-07-12 LAB — COMPREHENSIVE METABOLIC PANEL
ALBUMIN: 3.6 g/dL (ref 3.5–5.0)
ALK PHOS: 628 U/L — AB (ref 40–150)
ALT: 10 U/L (ref 0–55)
ANION GAP: 5 meq/L (ref 3–11)
AST: 23 U/L (ref 5–34)
BUN: 10.5 mg/dL (ref 7.0–26.0)
CALCIUM: 8.4 mg/dL (ref 8.4–10.4)
CO2: 24 mEq/L (ref 22–29)
Chloride: 110 mEq/L — ABNORMAL HIGH (ref 98–109)
Creatinine: 0.6 mg/dL — ABNORMAL LOW (ref 0.7–1.3)
Glucose: 96 mg/dl (ref 70–140)
POTASSIUM: 5 meq/L (ref 3.5–5.1)
Sodium: 139 mEq/L (ref 136–145)
Total Bilirubin: 0.33 mg/dL (ref 0.20–1.20)
Total Protein: 6.1 g/dL — ABNORMAL LOW (ref 6.4–8.3)

## 2016-07-12 LAB — PREPARE RBC (CROSSMATCH)

## 2016-07-12 MED ORDER — ACETAMINOPHEN 325 MG PO TABS
650.0000 mg | ORAL_TABLET | Freq: Once | ORAL | Status: AC
  Administered 2016-07-12: 650 mg via ORAL

## 2016-07-12 MED ORDER — DIPHENHYDRAMINE HCL 25 MG PO CAPS
ORAL_CAPSULE | ORAL | Status: AC
  Filled 2016-07-12: qty 1

## 2016-07-12 MED ORDER — DENOSUMAB 120 MG/1.7ML ~~LOC~~ SOLN
120.0000 mg | Freq: Once | SUBCUTANEOUS | Status: AC
  Administered 2016-07-12: 120 mg via SUBCUTANEOUS
  Filled 2016-07-12: qty 1.7

## 2016-07-12 MED ORDER — DIPHENHYDRAMINE HCL 25 MG PO CAPS
25.0000 mg | ORAL_CAPSULE | Freq: Once | ORAL | Status: AC
  Administered 2016-07-12: 25 mg via ORAL

## 2016-07-12 MED ORDER — OXYCODONE-ACETAMINOPHEN 10-325 MG PO TABS
1.0000 | ORAL_TABLET | ORAL | 0 refills | Status: DC | PRN
Start: 2016-07-12 — End: 2016-08-11

## 2016-07-12 MED ORDER — ACETAMINOPHEN 325 MG PO TABS
ORAL_TABLET | ORAL | Status: AC
  Filled 2016-07-12: qty 2

## 2016-07-12 NOTE — Progress Notes (Signed)
Eric Osborn Kitchen    HEMATOLOGY/ONCOLOGY CLINIC NOTE  Date of Service: .07/12/2016  Patient Care Team: [name], NP as PCP - General (Internal Medicine)  CHIEF COMPLAINTS/PURPOSE OF CONSULTATION:  f/u Multiple spinal bone mets concerning for metastatic prostate cancer  HISTORY OF PRESENTING ILLNESS:  Eric Mattix. is a wonderful 71 y.o. male who has been referred to [place] from the [facility] long hospital ED by [name] PA-C for evaluation and management of metastatic malignancy concerning for metastatic prostate cancer.  Patient has had a h/o locally advanced prostate cancer diagnosed in 2014 and had a radical prostatectomy and pelvic LN dissection by Dr [name] in 10/2012. Pathology showed pT3b pN0 disease (with positive margins, extraprostatic extension, seminal vesicle involvement and angiolymphatic invasion). Bone scan was negative for metastatic disease. Patient report no post-op RT. He notes that he received lupron shots as per his urologist for 1 year post-operatively. He was being monitored by Dr [name] and last had clinical evaluation and PSA about 75month ago - PSA level not available in our system. He notes that he was lost to f/u since then.  Patient presented to the ED with worsening lower and middle back pain for 2 weeks. Also notes about 15-20lbs weight loss and anorexia over the last 6-8weeks. He also notes hematuria. No fevers/chills. Patient had a CT abd/pelvis in the ED which showed Innumerable sclerotic lesions in all imaged bones consistent metastatic disease. The prostate gland has an irregular appearance and the patient may have prostate carcinoma. A few enlarged retroperitoneal lymph nodes are identified worrisome for additional foci of metastatic disease.  Patient's pain was somewhat controlled with prn percocet in ED and he was discharged home with f/u with [place] Patient notes that the patient wakes him up in the night.  No urinary retention. No loss of bowel or bladder  control. No new neurological symptoms in his extremities.   INTERVAL HISTORY  Patient is here to follow up after his 3rd cycle of Taxotere with G-CSF support. Patient notes that he has been eating much better. His back pain and other bone pains have improved and he is needing less pain medications. No fevers or chills. No night sweats. Notes some grade 1-2 fatigue from chemotherapy but is still trying to keep quite active. Notes some mild orthostatic dizziness with symptomatic anemia. Informed consent obtained for 1 unit of PRBC today. Schedule for next cycle of chemotherapy from 07/21/2016.  MEDICAL HISTORY:  Past Medical History:  Diagnosis Date  . Arthritis    left knee, left shoulder  . Erectile dysfunction 06/25/2012  . Foley catheter in place   . Goiter 06/25/2012  . H/O hiatal hernia   . Hematuria    occasional  . Hemorrhoid   . History of bladder infections   . Joint pain     SURGICAL HISTORY: Past Surgical History:  Procedure Laterality Date  . INGUINAL HERNIA REPAIR  8 yrs ago  . LYMPHADENECTOMY Bilateral 11/20/2012   Procedure: WITH BILATERAL PELVIC LYMPH NODE DISSECTION ;  Surgeon: DBernestine Amass MD;  Location: [facility];  Service: Urology;  Laterality: Bilateral;  . ROBOT ASSISTED LAPAROSCOPIC RADICAL PROSTATECTOMY N/A 11/20/2012   Procedure: ROBOTIC ASSISTED LAPAROSCOPIC RADICAL PROSTATECTOMY;  Surgeon: DBernestine Amass MD;  Location: [facility];  Service: Urology;  Laterality: N/A;    SOCIAL HISTORY: Social History   Social History  . Marital status: Divorced    Spouse name: N/A  . Number of children: N/A  . Years of education: 965  Occupational History  . Retired     Tourist information centre manager   Social History Main Topics  . Smoking status: Former Smoker    Packs/day: 0.25    Years: 25.00    Types: Cigarettes  . Smokeless tobacco: Former Systems developer     Comment: quit date 2016  . Alcohol use 3.6 oz/week    6 Cans of beer per week     Comment: occasional beer   . Drug use: Yes     Types: Marijuana     Comment: 3 x a month uses marijuana  . Sexual activity: Yes   Other Topics Concern  . Not on file   Social History Narrative   Regular exercise-no   Caffeine Use-yes    FAMILY HISTORY: Family History  Problem Relation Age of Onset  . Heart disease Sister   . Stroke Sister   . Colon cancer Paternal Uncle   . Diabetes Neg Hx     ALLERGIES:  is allergic to heparin and poison ivy extract [poison ivy extract].  MEDICATIONS:  Current Outpatient Prescriptions  Medication Sig Dispense Refill  . dexamethasone (DECADRON) 4 MG tablet Take 2 tablets (8 mg total) by mouth 2 (two) times daily. Start the day before Taxotere. Then daily after chemo for 2 days. 30 tablet 1  . dronabinol (MARINOL) 10 MG capsule Take 1 capsule (10 mg total) by mouth 2 (two) times daily before lunch and supper. 60 capsule 0  . ondansetron (ZOFRAN) 8 MG tablet Take 1 tablet (8 mg total) by mouth 2 (two) times daily as needed for refractory nausea / vomiting. 30 tablet 1  . oxyCODONE-acetaminophen (PERCOCET) 10-325 MG tablet Take 1 tablet by mouth every 4 (four) hours as needed for pain. 60 tablet 0  . prochlorperazine (COMPAZINE) 10 MG tablet Take 1 tablet (10 mg total) by mouth every 6 (six) hours as needed (Nausea or vomiting). 30 tablet 1  . Vitamin D, Ergocalciferol, (DRISDOL) 50000 units CAPS capsule Take 1 capsule (50,000 Units total) by mouth 2 (two) times a week. 24 capsule 0   No current facility-administered medications for this visit.     REVIEW OF SYSTEMS:    10 Point review of Systems was done is negative except as noted above.  PHYSICAL EXAMINATION: ECOG PERFORMANCE STATUS: 2 - Symptomatic, <50% confined to bed  . Vitals:   07/12/16 1216  BP: 115/61  Pulse: 86  Resp: 18  Temp: 97.6 F (36.4 C)   Filed Weights   07/12/16 1216  Weight: 177 lb 1 oz (80.3 kg)   .Body mass index is 23.36 kg/m.   . Wt Readings from Last 3 Encounters:  07/12/16 177 lb 1 oz (80.3 kg)   05/17/16 183 lb 9.6 oz (83.3 kg)  05/08/16 182 lb 6.4 oz (82.7 kg)   GENERAL:alert, in no acute distress and comfortable SKIN: skin color, texture, turgor are normal, no rashes or significant lesions EYES: normal, conjunctiva are pink and non-injected, sclera clear OROPHARYNX:no exudate, no erythema and lips, buccal mucosa, and tongue normal  NECK: supple, no JVD, thyroid normal size, non-tender, without nodularity LYMPH:  no palpable lymphadenopathy in the cervical, axillary or inguinal LUNGS: clear to auscultation with normal respiratory effort HEART: regular rate & rhythm,  no murmurs and no lower extremity edema ABDOMEN: abdomen soft, non-tender, normoactive bowel sounds  Musculoskeletal: No palpable areas of tenderness over the spine  PSYCH: alert & oriented x 3 with fluent speech NEURO: no focal motor/sensory deficits  LABORATORY DATA:  I have  reviewed the data as listed  . CBC Latest Ref Rng & Units 07/12/2016 06/30/2016 06/09/2016  WBC 4.0 - 10.3 10e3/uL 5.4 9.9 9.9  Hemoglobin 13.0 - 17.1 g/dL 8.1(L) 7.9(L) 8.2(L)  Hematocrit 38.4 - 49.9 % 26.9(L) 26.2(L) 25.8(L)  Platelets 140 - 400 10e3/uL 298 237 277    . CMP Latest Ref Rng & Units 07/12/2016 06/30/2016 06/09/2016  Glucose 70 - 140 mg/dl 96 100 93  BUN 7.0 - 26.0 mg/dL 10.5 9.1 12.6  Creatinine 0.7 - 1.3 mg/dL 0.6(L) 0.6(L) 0.7  Sodium 136 - 145 mEq/L 139 140 139  Potassium 3.5 - 5.1 mEq/L 5.0 4.5 4.8  Chloride 101 - 111 mmol/L - - -  CO2 22 - 29 mEq/L 24 21(L) 25  Calcium 8.4 - 10.4 mg/dL 8.4 7.9(L) 7.8(L)  Total Protein 6.4 - 8.3 g/dL 6.1(L) 6.3(L) 6.4  Total Bilirubin 0.20 - 1.20 mg/dL 0.33 0.32 0.32  Alkaline Phos 40 - 150 U/L 628(H) 1,159(H) 2,189(H)  AST 5 - 34 U/L _0 ALT 0 - 55 U/L _1 Component     Latest Ref Rng & Units 05/08/2016  Glucose     Negative mg/dL Negative  Bilirubin (Urine)     Negative Negative  Ketones     Negative mg/dL Negative  Specific Gravity, Urine     1.003 - 1.035  1.030  Blood     Negative Large  pH     4.6 - 8.0 6.0  Protein     Negative- <30 mg/dL 100  Urobilinogen, UR     0.2 - 1 mg/dL 0.2  Nitrite     Negative Negative  Leukocyte Esterase     Negative Negative  RBC / HPF     0 - 2 21-50  WBC, UA     0-2;Negative 0-2  Bacteria, UA     Negative- Trace Few  Epithelial Cells     Negative- Few Few  Mucus, UA     Negative- Small Small  Iron     42 - 163 ug/dL 119  TIBC     202 - 409 ug/dL 269  UIBC     117 - 376 ug/dL 149  %SAT     20 - 55 % 44  T3 Uptake Ratio     24 - 39 % 24  Free Thyroxine Index     1.2 - 4.9 1.7  Vitamin D, 25-Hydroxy     30.0 - 100.0 ng/mL 11.2 (L)  PSA     0.0 - 4.0 ng/mL 549.0 (H)  Ferritin     22 - 316 ng/ml 582 (H)  TSH     0.320 - 4.118 m(IU)/L 2.473  Thyroxine (T4)     4.5 - 12.0 ug/dL 7.2  T4,Free(Direct)     0.82 - 1.77 ng/dL 1.14       RADIOGRAPHIC STUDIES: I have personally reviewed the radiological images as listed and agreed with the findings in the report. No results found.  ASSESSMENT & PLAN:   71 yo AAM with previous h/o of locally advanced pT3b,pN0 prostate cancer now with concern for newly noted metastatic prostate cancer.  1) Metastatic Prostate Cancer  PSA level 550 pre-treatment Patient is noted to have extensive osseous metastatic disease throughout the spine and bilaterally in the calvarium as well. Also noted to have local recurrence in the prostatic bed, retroperitoneal Lnadenoapathy and some mediastinal LNadenoapathy. No evidence of cord compression or focal neurological symptoms. This was qualify as  high volume disease. ECOG PS 1 Previously locally advanced pT3b,pN0 diagnosed in 10/2012 s/p radical prostatectomy and ADT x 1 yr. Testosterone level 7 -- is dropping appropriately with treatment. Plan --no prohibitive toxicities from treatment thus far -continue with docetaxel + ADT (will need to rpt labs to check calcium levels and if low might need IV Calcium  replacement) with G-CSF support -continue depot Lupron q87month -Xgeva for bony mets q 4 weeks -replacing Vit D aggressively due to significant deficiency and hypocalcemia. -Ergocalciferol 50k units 2 times  weekly x 12 weeks. -continue urology f/u with Dr GRisa Grill-We shall repeat a PSA level on 07/21/2016 when he comes back for his fourth cycle of docetaxel with G-CSF support.  2) Elevated alkaline phosphatase level due to extensive bone mets. Improving with treatment. 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 Notes some symptoms and shall be due for next cycle of chemotherapy on 07/21/2016 -informed consent obtained. Patient will be getting one unit of PRBCs today with Tylenol and Benadryl premedications.  4) Hematuria due to local recurrence of prostate cancer - no gross hematuria reported by patient now.  5) Anorexia with weight loss of about 15-20lbs. Notes improved po intake with Marinol. Patient notes he's been eating much better.  6) Vit D deficiency with hypocalcemia -Being aggressively replaced since his calcium evels have dropped after Xgeva likely reflecting significant bone calcium deficits from extensive healing bone lesions. ALK PO4 has dropped by 50% -Continue ergocalciferol 50,000U twice weekly. -Calcium levels have now normalized today. -Repeat 25 hydroxy vitamin D levels on next visit.  7) Neoplasm related pain - much improved patient is has not needed much of his prescribed pain medications Plan -Continue Percocet when necessary for neoplasm related bone pains.refill given today. -Senna S for bowel prophylaxis  7) Colonic thickening in transverse colon thought to be related to spasm/incomplete distension of colon in this area. -will need colonoscopy at some point with PCP after completion of chemotherapy.  -PRBC 1 unit transfusion today -Continue Docetaxel chemotherapy as per orders -rpt labs on 07/21/2016 when patient comes back  for chemotherapy -continue Xgeva q4weeks -continue Lupron q339month-RTC with Dr KaIrene Limboith C5D1 of chemotherapy in about 4weeks with labs  All of the patients questions were answered with apparent satisfaction. The patient knows to call the clinic with any problems, questions or concerns.  I spent 25 minutes counseling the patient face to face. The total time spent in the appointment was 30 minutes and more than 50% was on counseling and direct patient cares.    GaSullivan LoneD MSHyattvilleAHIVMS SCMerritt Island Outpatient Surgery CenterTGood Samaritan Hospitalematology/Oncology Physician CoMayo Clinic Health Sys Wase[place](Office):       [phone] cell):  [phone]):           [phone]

## 2016-07-12 NOTE — Patient Instructions (Signed)

## 2016-07-12 NOTE — Progress Notes (Signed)
Pt tolerated first time blood infusion without any complications or complaints. Pt encouraged to seek emergency assistanc if he experience any chest pain or sob at home. Reviewed blood transfusion after care and avs printed. Vs remained stable upon discharge. No concerns or questions at this time.

## 2016-07-12 NOTE — Patient Instructions (Signed)
Blood Transfusion , Adult A blood transfusion is a procedure in which you receive donated blood, including plasma, platelets, and red blood cells, through an IV tube. You may need a blood transfusion because of illness, surgery, or injury. The blood may come from a donor. You may also be able to donate blood for yourself (autologous blood donation) before a surgery if you know that you might require a blood transfusion. The blood given in a transfusion is made up of different types of cells. You may receive:  Red blood cells. These carry oxygen to the cells in the body.  White blood cells. These help you fight infections.  Platelets. These help your blood to clot.  Plasma. This is the liquid part of your blood and it helps with fluid imbalances. If you have hemophilia or another clotting disorder, you may also receive other types of blood products. Tell a health care provider about:  Any allergies you have.  All medicines you are taking, including vitamins, herbs, eye drops, creams, and over-the-counter medicines.  Any problems you or family members have had with anesthetic medicines.  Any blood disorders you have.  Any surgeries you have had.  Any medical conditions you have, including any recent fever or cold symptoms.  Whether you are pregnant or may be pregnant.  Any previous reactions you have had during a blood transfusion. What are the risks? Generally, this is a safe procedure. However, problems may occur, including:  Having an allergic reaction to something in the donated blood. Hives and itching may be symptoms of this type of reaction.  Fever. This may be a reaction to the white blood cells in the transfused blood. Nausea or chest pain may accompany a fever.  Iron overload. This can happen from having many transfusions.  Transfusion-related acute lung injury (TRALI). This is a rare reaction that causes lung damage. The cause is not known.TRALI can occur within hours  of a transfusion or several days later.  Sudden (acute) or delayed hemolytic reactions. This happens if your blood does not match the cells in your transfusion. Your body's defense system (immune system) may try to attack the new cells. This complication is rare. The symptoms include fever, chills, nausea, and low back pain or chest pain.  Infection or disease transmission. This is rare. What happens before the procedure?  You will have a blood test to determine your blood type. This is necessary to know what kind of blood your body will accept and to match it to the donor blood.  If you are going to have a planned surgery, you may be able to do an autologous blood donation. This may be done in case you need to have a transfusion.  If you have had an allergic reaction to a transfusion in the past, you may be given medicine to help prevent a reaction. This medicine may be given to you by mouth or through an IV tube.  You will have your temperature, blood pressure, and pulse monitored before the transfusion.  Follow instructions from your health care provider about eating and drinking restrictions.  Ask your health care provider about:  Changing or stopping your regular medicines. This is especially important if you are taking diabetes medicines or blood thinners.  Taking medicines such as aspirin and ibuprofen. These medicines can thin your blood. Do not take these medicines before your procedure if your health care provider instructs you not to. What happens during the procedure?  An IV tube will be   inserted into one of your veins.  The bag of donated blood will be attached to your IV tube. The blood will then enter through your vein.  Your temperature, blood pressure, and pulse will be monitored regularly during the transfusion. This monitoring is done to detect early signs of a transfusion reaction.  If you have any signs or symptoms of a reaction, your transfusion will be stopped and  you may be given medicine.  When the transfusion is complete, your IV tube will be removed.  Pressure may be applied to the IV site for a few minutes.  A bandage (dressing) will be applied. The procedure may vary among health care providers and hospitals. What happens after the procedure?  Your temperature, blood pressure, heart rate, breathing rate, and blood oxygen level will be monitored often.  Your blood may be tested to see how you are responding to the transfusion.  You may be warmed with fluids or blankets to maintain a normal body temperature. Summary  A blood transfusion is a procedure in which you receive donated blood, including plasma, platelets, and red blood cells, through an IV tube.  Your temperature, blood pressure, and pulse will be monitored before, during, and after the transfusion.  Your blood may be tested after the transfusion to see how your body has responded. This information is not intended to replace advice given to you by your health care provider. Make sure you discuss any questions you have with your health care provider. Document Released: 03/10/2000 Document Revised: 12/09/2015 Document Reviewed: 12/09/2015 Elsevier Interactive Patient Education  2017 Elsevier Inc.  

## 2016-07-13 ENCOUNTER — Telehealth: Payer: Self-pay | Admitting: Hematology

## 2016-07-13 ENCOUNTER — Encounter: Payer: Self-pay | Admitting: *Deleted

## 2016-07-13 LAB — TYPE AND SCREEN
ABO/RH(D): O POS
ANTIBODY SCREEN: NEGATIVE
Unit division: 0

## 2016-07-13 LAB — BPAM RBC
Blood Product Expiration Date: 201805092359
ISSUE DATE / TIME: 201804181510
UNIT TYPE AND RH: 5100

## 2016-07-13 NOTE — Telephone Encounter (Signed)
Scheduled appts per 4/18 los. No availability for f/u on 5/18 to schedule per los. - msg sent to MD. Once appt is made will call and confirm appts with patient.

## 2016-07-13 NOTE — Progress Notes (Signed)
Dental referral faxed to [place] office [phone]

## 2016-07-14 ENCOUNTER — Telehealth: Payer: Self-pay | Admitting: Hematology

## 2016-07-14 NOTE — Telephone Encounter (Signed)
Called to confirm appts - no answer left message with appt date and time and sent letter in the mail.

## 2016-07-21 ENCOUNTER — Ambulatory Visit (HOSPITAL_BASED_OUTPATIENT_CLINIC_OR_DEPARTMENT_OTHER): Payer: Medicare Other

## 2016-07-21 ENCOUNTER — Other Ambulatory Visit (HOSPITAL_BASED_OUTPATIENT_CLINIC_OR_DEPARTMENT_OTHER): Payer: Medicare Other

## 2016-07-21 VITALS — BP 115/73 | HR 72 | Temp 97.9°F | Resp 18

## 2016-07-21 DIAGNOSIS — C7951 Secondary malignant neoplasm of bone: Secondary | ICD-10-CM

## 2016-07-21 DIAGNOSIS — E559 Vitamin D deficiency, unspecified: Secondary | ICD-10-CM

## 2016-07-21 DIAGNOSIS — C61 Malignant neoplasm of prostate: Secondary | ICD-10-CM

## 2016-07-21 DIAGNOSIS — Z7189 Other specified counseling: Secondary | ICD-10-CM

## 2016-07-21 DIAGNOSIS — Z5111 Encounter for antineoplastic chemotherapy: Secondary | ICD-10-CM

## 2016-07-21 LAB — COMPREHENSIVE METABOLIC PANEL
ALK PHOS: 639 U/L — AB (ref 40–150)
ALT: 13 U/L (ref 0–55)
ANION GAP: 7 meq/L (ref 3–11)
AST: 25 U/L (ref 5–34)
Albumin: 4 g/dL (ref 3.5–5.0)
BILIRUBIN TOTAL: 0.32 mg/dL (ref 0.20–1.20)
BUN: 12.9 mg/dL (ref 7.0–26.0)
CO2: 21 mEq/L — ABNORMAL LOW (ref 22–29)
CREATININE: 0.6 mg/dL — AB (ref 0.7–1.3)
Calcium: 7.3 mg/dL — ABNORMAL LOW (ref 8.4–10.4)
Chloride: 112 mEq/L — ABNORMAL HIGH (ref 98–109)
GLUCOSE: 97 mg/dL (ref 70–140)
Potassium: 4.5 mEq/L (ref 3.5–5.1)
SODIUM: 140 meq/L (ref 136–145)
TOTAL PROTEIN: 6.7 g/dL (ref 6.4–8.3)

## 2016-07-21 LAB — CBC & DIFF AND RETIC
BASO%: 0.3 % (ref 0.0–2.0)
Basophils Absolute: 0 10*3/uL (ref 0.0–0.1)
EOS%: 0.7 % (ref 0.0–7.0)
Eosinophils Absolute: 0.1 10*3/uL (ref 0.0–0.5)
HCT: 33.2 % — ABNORMAL LOW (ref 38.4–49.9)
HGB: 10.2 g/dL — ABNORMAL LOW (ref 13.0–17.1)
IMMATURE RETIC FRACT: 17.8 % — AB (ref 3.00–10.60)
LYMPH%: 61.6 % — AB (ref 14.0–49.0)
MCH: 28.4 pg (ref 27.2–33.4)
MCHC: 30.7 g/dL — ABNORMAL LOW (ref 32.0–36.0)
MCV: 92.5 fL (ref 79.3–98.0)
MONO#: 0.7 10*3/uL (ref 0.1–0.9)
MONO%: 9.2 % (ref 0.0–14.0)
NEUT#: 2.1 10*3/uL (ref 1.5–6.5)
NEUT%: 28.2 % — AB (ref 39.0–75.0)
Platelets: 247 10*3/uL (ref 140–400)
RBC: 3.59 10*6/uL — ABNORMAL LOW (ref 4.20–5.82)
RDW: 20.7 % — ABNORMAL HIGH (ref 11.0–14.6)
Retic %: 2.52 % — ABNORMAL HIGH (ref 0.80–1.80)
Retic Ct Abs: 90.47 10*3/uL (ref 34.80–93.90)
WBC: 7.5 10*3/uL (ref 4.0–10.3)
lymph#: 4.6 10*3/uL — ABNORMAL HIGH (ref 0.9–3.3)

## 2016-07-21 MED ORDER — DOCETAXEL CHEMO INJECTION 160 MG/16ML
75.0000 mg/m2 | Freq: Once | INTRAVENOUS | Status: AC
  Administered 2016-07-21: 150 mg via INTRAVENOUS
  Filled 2016-07-21: qty 15

## 2016-07-21 MED ORDER — SODIUM CHLORIDE 0.9 % IV SOLN
Freq: Once | INTRAVENOUS | Status: AC
  Administered 2016-07-21: 14:00:00 via INTRAVENOUS

## 2016-07-21 MED ORDER — DEXAMETHASONE SODIUM PHOSPHATE 10 MG/ML IJ SOLN
INTRAMUSCULAR | Status: AC
  Filled 2016-07-21: qty 1

## 2016-07-21 MED ORDER — DEXAMETHASONE SODIUM PHOSPHATE 10 MG/ML IJ SOLN
10.0000 mg | Freq: Once | INTRAMUSCULAR | Status: AC
  Administered 2016-07-21: 10 mg via INTRAVENOUS

## 2016-07-21 MED ORDER — PEGFILGRASTIM 6 MG/0.6ML ~~LOC~~ PSKT
6.0000 mg | PREFILLED_SYRINGE | Freq: Once | SUBCUTANEOUS | Status: AC
  Administered 2016-07-21: 6 mg via SUBCUTANEOUS
  Filled 2016-07-21: qty 0.6

## 2016-07-21 NOTE — Patient Instructions (Signed)
[facility] Discharge Instructions for Patients Receiving Chemotherapy  Today you received the following chemotherapy agent: Taxotere.  To help prevent nausea and vomiting after your treatment, we encourage you to take your nausea medication as prescribed.   If you develop nausea and vomiting that is not controlled by your nausea medication, call the clinic.   BELOW ARE SYMPTOMS THAT SHOULD BE REPORTED IMMEDIATELY:  *FEVER GREATER THAN 100.5 F  *CHILLS WITH OR WITHOUT FEVER  NAUSEA AND VOMITING THAT IS NOT CONTROLLED WITH YOUR NAUSEA MEDICATION  *UNUSUAL SHORTNESS OF BREATH  *UNUSUAL BRUISING OR BLEEDING  TENDERNESS IN MOUTH AND THROAT WITH OR WITHOUT PRESENCE OF ULCERS  *URINARY PROBLEMS  *BOWEL PROBLEMS  UNUSUAL RASH Items with * indicate a potential emergency and should be followed up as soon as possible.  Feel free to call the clinic you have any questions or concerns. The clinic phone number is [phone].  Please show the [place] at check-in to the Emergency Department and triage nurse.

## 2016-07-22 LAB — PSA: PROSTATE SPECIFIC AG, SERUM: 7.1 ng/mL — AB (ref 0.0–4.0)

## 2016-07-22 LAB — VITAMIN D 25 HYDROXY (VIT D DEFICIENCY, FRACTURES): Vitamin D, 25-Hydroxy: 16.2 ng/mL — ABNORMAL LOW (ref 30.0–100.0)

## 2016-08-01 ENCOUNTER — Telehealth (HOSPITAL_COMMUNITY): Payer: Self-pay | Admitting: Dentistry

## 2016-08-01 NOTE — Telephone Encounter (Signed)
08/01/16 Called and left. msg. for pt. to call and schl. Dental Consult w/Dr. Enrique Sack.  LRI

## 2016-08-11 ENCOUNTER — Ambulatory Visit (HOSPITAL_BASED_OUTPATIENT_CLINIC_OR_DEPARTMENT_OTHER): Payer: Medicare Other | Admitting: Hematology

## 2016-08-11 ENCOUNTER — Ambulatory Visit (HOSPITAL_BASED_OUTPATIENT_CLINIC_OR_DEPARTMENT_OTHER): Payer: Medicare Other

## 2016-08-11 ENCOUNTER — Other Ambulatory Visit (HOSPITAL_BASED_OUTPATIENT_CLINIC_OR_DEPARTMENT_OTHER): Payer: Medicare Other

## 2016-08-11 ENCOUNTER — Encounter: Payer: Self-pay | Admitting: Hematology

## 2016-08-11 VITALS — BP 116/71 | HR 87 | Temp 97.9°F | Resp 18 | Ht 73.0 in | Wt 175.6 lb

## 2016-08-11 DIAGNOSIS — D6481 Anemia due to antineoplastic chemotherapy: Secondary | ICD-10-CM | POA: Diagnosis not present

## 2016-08-11 DIAGNOSIS — Z5111 Encounter for antineoplastic chemotherapy: Secondary | ICD-10-CM

## 2016-08-11 DIAGNOSIS — G893 Neoplasm related pain (acute) (chronic): Secondary | ICD-10-CM | POA: Diagnosis not present

## 2016-08-11 DIAGNOSIS — R630 Anorexia: Secondary | ICD-10-CM

## 2016-08-11 DIAGNOSIS — R634 Abnormal weight loss: Secondary | ICD-10-CM | POA: Diagnosis not present

## 2016-08-11 DIAGNOSIS — C7951 Secondary malignant neoplasm of bone: Secondary | ICD-10-CM

## 2016-08-11 DIAGNOSIS — E559 Vitamin D deficiency, unspecified: Secondary | ICD-10-CM

## 2016-08-11 DIAGNOSIS — R748 Abnormal levels of other serum enzymes: Secondary | ICD-10-CM | POA: Diagnosis not present

## 2016-08-11 DIAGNOSIS — C61 Malignant neoplasm of prostate: Secondary | ICD-10-CM

## 2016-08-11 DIAGNOSIS — Z7189 Other specified counseling: Secondary | ICD-10-CM

## 2016-08-11 DIAGNOSIS — D630 Anemia in neoplastic disease: Secondary | ICD-10-CM | POA: Diagnosis not present

## 2016-08-11 LAB — COMPREHENSIVE METABOLIC PANEL
ALT: 11 U/L (ref 0–55)
AST: 25 U/L (ref 5–34)
Albumin: 4 g/dL (ref 3.5–5.0)
Alkaline Phosphatase: 448 U/L — ABNORMAL HIGH (ref 40–150)
Anion Gap: 8 mEq/L (ref 3–11)
BILIRUBIN TOTAL: 0.33 mg/dL (ref 0.20–1.20)
BUN: 10.8 mg/dL (ref 7.0–26.0)
CHLORIDE: 112 meq/L — AB (ref 98–109)
CO2: 22 meq/L (ref 22–29)
CREATININE: 0.6 mg/dL — AB (ref 0.7–1.3)
Calcium: 7.5 mg/dL — ABNORMAL LOW (ref 8.4–10.4)
EGFR: >90 mL/min/{1.73_m2} (ref >90)
Glucose: 118 mg/dl (ref 70–140)
Potassium: 4.4 mEq/L (ref 3.5–5.1)
Sodium: 141 mEq/L (ref 136–145)
TOTAL PROTEIN: 6.7 g/dL (ref 6.4–8.3)

## 2016-08-11 LAB — CBC & DIFF AND RETIC
BASO%: 0.4 % (ref 0.0–2.0)
Basophils Absolute: 0 10*3/uL (ref 0.0–0.1)
EOS ABS: 0.1 10*3/uL (ref 0.0–0.5)
EOS%: 0.8 % (ref 0.0–7.0)
HCT: 34.6 % — ABNORMAL LOW (ref 38.4–49.9)
HGB: 10.4 g/dL — ABNORMAL LOW (ref 13.0–17.1)
IMMATURE RETIC FRACT: 18.3 % — AB (ref 3.00–10.60)
LYMPH%: 60.2 % — AB (ref 14.0–49.0)
MCH: 27.9 pg (ref 27.2–33.4)
MCHC: 30.1 g/dL — AB (ref 32.0–36.0)
MCV: 92.8 fL (ref 79.3–98.0)
MONO#: 0.6 10*3/uL (ref 0.1–0.9)
MONO%: 8.3 % (ref 0.0–14.0)
NEUT#: 2.4 10*3/uL (ref 1.5–6.5)
NEUT%: 30.3 % — AB (ref 39.0–75.0)
PLATELETS: 308 10*3/uL (ref 140–400)
RBC: 3.73 10*6/uL — ABNORMAL LOW (ref 4.20–5.82)
RDW: 20.3 % — AB (ref 11.0–14.6)
RETIC %: 2.48 % — AB (ref 0.80–1.80)
RETIC CT ABS: 92.5 10*3/uL (ref 34.80–93.90)
WBC: 7.7 10*3/uL (ref 4.0–10.3)
lymph#: 4.7 10*3/uL — ABNORMAL HIGH (ref 0.9–3.3)
nRBC: 0 % (ref 0–0)

## 2016-08-11 LAB — TECHNOLOGIST REVIEW

## 2016-08-11 MED ORDER — DEXAMETHASONE SODIUM PHOSPHATE 10 MG/ML IJ SOLN
10.0000 mg | Freq: Once | INTRAMUSCULAR | Status: AC
  Administered 2016-08-11: 10 mg via INTRAVENOUS

## 2016-08-11 MED ORDER — DOCETAXEL CHEMO INJECTION 160 MG/16ML
75.0000 mg/m2 | Freq: Once | INTRAVENOUS | Status: AC
  Administered 2016-08-11: 150 mg via INTRAVENOUS
  Filled 2016-08-11: qty 15

## 2016-08-11 MED ORDER — DENOSUMAB 120 MG/1.7ML ~~LOC~~ SOLN
120.0000 mg | Freq: Once | SUBCUTANEOUS | Status: AC
  Administered 2016-08-11: 120 mg via SUBCUTANEOUS
  Filled 2016-08-11: qty 1.7

## 2016-08-11 MED ORDER — PEGFILGRASTIM 6 MG/0.6ML ~~LOC~~ PSKT
6.0000 mg | PREFILLED_SYRINGE | Freq: Once | SUBCUTANEOUS | Status: AC
  Administered 2016-08-11: 6 mg via SUBCUTANEOUS
  Filled 2016-08-11: qty 0.6

## 2016-08-11 MED ORDER — OXYCODONE-ACETAMINOPHEN 10-325 MG PO TABS: 1.0000 | ORAL_TABLET | ORAL | 0 refills | Status: DC | PRN

## 2016-08-11 MED ORDER — SODIUM CHLORIDE 0.9 % IV SOLN
Freq: Once | INTRAVENOUS | Status: AC
  Administered 2016-08-11: 13:00:00 via INTRAVENOUS

## 2016-08-11 MED ORDER — DEXAMETHASONE SODIUM PHOSPHATE 10 MG/ML IJ SOLN
INTRAMUSCULAR | Status: AC
  Filled 2016-08-11: qty 1

## 2016-08-11 MED ORDER — LEUPROLIDE ACETATE (3 MONTH) 22.5 MG IM KIT
22.5000 mg | PACK | Freq: Once | INTRAMUSCULAR | Status: AC
  Administered 2016-08-11: 22.5 mg via INTRAMUSCULAR
  Filled 2016-08-11: qty 22.5

## 2016-08-11 MED ORDER — CALCIUM CARBONATE ANTACID 500 MG PO CHEW
2.0000 | CHEWABLE_TABLET | Freq: Once | ORAL | Status: AC
  Administered 2016-08-11: 400 mg via ORAL
  Filled 2016-08-11: qty 2

## 2016-08-11 MED ORDER — VITAMIN D (ERGOCALCIFEROL) 1.25 MG (50000 UNIT) PO CAPS: 50000.0000 [IU] | ORAL_CAPSULE | ORAL | 0 refills | Status: DC

## 2016-08-11 NOTE — Progress Notes (Signed)
Per Dr. Irene Limbo okay to treat with [facility] with creatine 7.5, pt to receive 2 tums.  Pt educated on On Pro, pt verbalizes understanding.

## 2016-08-11 NOTE — Patient Instructions (Signed)
Thank you for choosing [facility] to provide your oncology and hematology care.  To afford each patient quality time with our providers, please arrive 30 minutes before your scheduled appointment time.  If you arrive late for your appointment, you may be asked to reschedule.  We strive to give you quality time with our providers, and arriving late affects you and other patients whose appointments are after yours.   If you are a no show for multiple scheduled visits, you may be dismissed from the clinic at the providers discretion.    Again, thank you for choosing [facility], our hope is that these requests will decrease the amount of time that you wait before being seen by our physicians.  ______________________________________________________________________  Should you have questions after your visit to the [facility], please contact our office at [phone] between the hours of 8:30 and 4:30 p.m.    Voicemails left after 4:30p.m will not be returned until the following business day.    For prescription refill requests, please have your pharmacy contact us directly.  Please also try to allow 48 hours for prescription requests.    Please contact the scheduling department for questions regarding scheduling.  For scheduling of procedures such as PET scans, CT scans, MRI, Ultrasound, etc please contact central scheduling at [phone].    Resources For Cancer Patients and Caregivers:   Oncolink.org:  A wonderful resource for patients and healthcare providers for information regarding your disease, ways to tract your treatment, what to expect, etc.     American Cancer Society:  800-227-2345  Can help patients locate various types of support and financial assistance  Cancer Care: 1-800-813-HOPE (4673) Provides financial assistance, online support groups, medication/co-pay assistance.    [place] DSS:  336-641-3447 Where to apply for food  stamps, Medicaid, and utility assistance  Medicare Rights Center: 800-333-4114 Helps people with Medicare understand their rights and benefits, navigate the Medicare system, and secure the quality healthcare they deserve  SCAT: 336-333-6589 [place] [employer]'s shared-ride transportation service for eligible riders who have a disability that prevents them from riding the fixed route bus.    For additional information on assistance programs please contact our social worker:   Grier Hock/Abigail Elmore:  [phone]            

## 2016-08-11 NOTE — Patient Instructions (Signed)
[name] Discharge Instructions for Patients Receiving Chemotherapy  Today you received the following chemotherapy agents:  Taxotere.  To help prevent nausea and vomiting after your treatment, we encourage you to take your nausea medication as directed.   If you develop nausea and vomiting that is not controlled by your nausea medication, call the clinic.   BELOW ARE SYMPTOMS THAT SHOULD BE REPORTED IMMEDIATELY:  *FEVER GREATER THAN 100.5 F  *CHILLS WITH OR WITHOUT FEVER  NAUSEA AND VOMITING THAT IS NOT CONTROLLED WITH YOUR NAUSEA MEDICATION  *UNUSUAL SHORTNESS OF BREATH  *UNUSUAL BRUISING OR BLEEDING  TENDERNESS IN MOUTH AND THROAT WITH OR WITHOUT PRESENCE OF ULCERS  *URINARY PROBLEMS  *BOWEL PROBLEMS  UNUSUAL RASH Items with * indicate a potential emergency and should be followed up as soon as possible.  Feel free to call the clinic you have any questions or concerns. The clinic phone number is [phone].  Please show the CHEMO ALERT CARD at check-in to the Emergency Department and triage nurse.  

## 2016-08-11 NOTE — Progress Notes (Signed)
[name]    HEMATOLOGY/ONCOLOGY CLINIC NOTE  Date of Service: .08/11/2016  Patient Care Team: [name], NP as PCP - General (Internal Medicine)  CHIEF COMPLAINTS/PURPOSE OF CONSULTATION:  f/u Multiple spinal bone mets concerning for metastatic prostate cancer  HISTORY OF PRESENTING ILLNESS:  Lenwood Balsam. is a wonderful 71 y.o. male who has been referred to [place] from the [facility] long hospital ED by [name] PA-C for evaluation and management of metastatic malignancy concerning for metastatic prostate cancer.  Patient has had a h/o locally advanced prostate cancer diagnosed in 2014 and had a radical prostatectomy and pelvic LN dissection by Dr [name] in 10/2012. Pathology showed pT3b pN0 disease (with positive margins, extraprostatic extension, seminal vesicle involvement and angiolymphatic invasion). Bone scan was negative for metastatic disease. Patient report no post-op RT. He notes that he received lupron shots as per his urologist for 1 year post-operatively. He was being monitored by Dr [name] and last had clinical evaluation and PSA about 55month ago - PSA level not available in our system. He notes that he was lost to f/u since then.  Patient presented to the ED with worsening lower and middle back pain for 2 weeks. Also notes about 15-20lbs weight loss and anorexia over the last 6-8weeks. He also notes hematuria. No fevers/chills. Patient had a CT abd/pelvis in the ED which showed Innumerable sclerotic lesions in all imaged bones consistent metastatic disease. The prostate gland has an irregular appearance and the patient may have prostate carcinoma. A few enlarged retroperitoneal lymph nodes are identified worrisome for additional foci of metastatic disease.  Patient's pain was somewhat controlled with prn percocet in ED and he was discharged home with f/u with [place] Patient notes that the patient wakes him up in the night.  No urinary retention. No loss of bowel or bladder  control. No new neurological symptoms in his extremities.   INTERVAL HISTORY  Patient is here to follow up prior to his 5th cycle of Taxotere with G-CSF support. Patient notes that he has been feeling well and has only been using pain medications infrequently as needed for his pain from bony metastases. He is not sure is he is taking his vitamin D and was given a new prescription in for ergocalciferol. Calcium Levels was somewhat low he was given Tums prior to his Xgeva . PSA levels coming down nicely. He has been staying physically active.  MEDICAL HISTORY:  Past Medical History:  Diagnosis Date  . Arthritis    left knee, left shoulder  . Erectile dysfunction 06/25/2012  . Foley catheter in place   . Goiter 06/25/2012  . H/O hiatal hernia   . Hematuria    occasional  . Hemorrhoid   . History of bladder infections   . Joint pain     SURGICAL HISTORY: Past Surgical History:  Procedure Laterality Date  . INGUINAL HERNIA REPAIR  8 yrs ago  . LYMPHADENECTOMY Bilateral 11/20/2012   Procedure: WITH BILATERAL PELVIC LYMPH NODE DISSECTION ;  Surgeon: DBernestine Amass MD;  Location: [facility];  Service: Urology;  Laterality: Bilateral;  . ROBOT ASSISTED LAPAROSCOPIC RADICAL PROSTATECTOMY N/A 11/20/2012   Procedure: ROBOTIC ASSISTED LAPAROSCOPIC RADICAL PROSTATECTOMY;  Surgeon: DBernestine Amass MD;  Location: [facility];  Service: Urology;  Laterality: N/A;    SOCIAL HISTORY: Social History   Social History  . Marital status: Divorced    Spouse name: N/A  . Number of children: N/A  . Years of education: 969  Occupational History  . Retired     Tourist information centre manager   Social History Main Topics  . Smoking status: Former Smoker    Packs/day: 0.25    Years: 25.00    Types: Cigarettes  . Smokeless tobacco: Former Systems developer     Comment: quit date 2016  . Alcohol use 3.6 oz/week    6 Cans of beer per week     Comment: occasional beer   . Drug use: Yes    Types: Marijuana     Comment: 3 x a month uses  marijuana  . Sexual activity: Yes   Other Topics Concern  . Not on file   Social History Narrative   Regular exercise-no   Caffeine Use-yes    FAMILY HISTORY: Family History  Problem Relation Age of Onset  . Heart disease Sister   . Stroke Sister   . Colon cancer Paternal Uncle   . Diabetes Neg Hx     ALLERGIES:  is allergic to heparin and poison ivy extract [poison ivy extract].  MEDICATIONS:  Current Outpatient Prescriptions  Medication Sig Dispense Refill  . dexamethasone (DECADRON) 4 MG tablet Take 2 tablets (8 mg total) by mouth 2 (two) times daily. Start the day before Taxotere. Then daily after chemo for 2 days. 30 tablet 1  . dronabinol (MARINOL) 10 MG capsule Take 1 capsule (10 mg total) by mouth 2 (two) times daily before lunch and supper. 60 capsule 0  . ondansetron (ZOFRAN) 8 MG tablet Take 1 tablet (8 mg total) by mouth 2 (two) times daily as needed for refractory nausea / vomiting. 30 tablet 1  . oxyCODONE-acetaminophen (PERCOCET) 10-325 MG tablet Take 1 tablet by mouth every 4 (four) hours as needed for pain. 60 tablet 0  . prochlorperazine (COMPAZINE) 10 MG tablet Take 1 tablet (10 mg total) by mouth every 6 (six) hours as needed (Nausea or vomiting). 30 tablet 1  . [START ON 08/14/2016] Vitamin D, Ergocalciferol, (DRISDOL) 50000 units CAPS capsule Take 1 capsule (50,000 Units total) by mouth 2 (two) times a week. 24 capsule 0   No current facility-administered medications for this visit.     REVIEW OF SYSTEMS:    10 Point review of Systems was done is negative except as noted above.  PHYSICAL EXAMINATION: ECOG PERFORMANCE STATUS: 2 - Symptomatic, <50% confined to bed  . Vitals:   08/11/16 1135  BP: 116/71  Pulse: 87  Resp: 18  Temp: 97.9 F (36.6 C)   Filed Weights   08/11/16 1135  Weight: 175 lb 9.6 oz (79.7 kg)   .Body mass index is 23.17 kg/m.   . Wt Readings from Last 3 Encounters:  08/11/16 175 lb 9.6 oz (79.7 kg)  07/12/16 177 lb 1 oz  (80.3 kg)  05/17/16 183 lb 9.6 oz (83.3 kg)   GENERAL:alert, in no acute distress and comfortable SKIN: skin color, texture, turgor are normal, no rashes or significant lesions EYES: normal, conjunctiva are pink and non-injected, sclera clear OROPHARYNX:no exudate, no erythema and lips, buccal mucosa, and tongue normal  NECK: supple, no JVD, thyroid normal size, non-tender, without nodularity LYMPH:  no palpable lymphadenopathy in the cervical, axillary or inguinal LUNGS: clear to auscultation with normal respiratory effort HEART: regular rate & rhythm,  no murmurs and no lower extremity edema ABDOMEN: abdomen soft, non-tender, normoactive bowel sounds  Musculoskeletal: No palpable areas of tenderness over the spine  PSYCH: alert & oriented x 3 with fluent speech NEURO: no focal motor/sensory deficits  LABORATORY DATA:  I have reviewed the data as listed  . CBC Latest Ref Rng & Units 08/11/2016 07/21/2016 07/12/2016  WBC 4.0 - 10.3 10e3/uL 7.7 7.5 5.4  Hemoglobin 13.0 - 17.1 g/dL 10.4(L) 10.2(L) 8.1(L)  Hematocrit 38.4 - 49.9 % 34.6(L) 33.2(L) 26.9(L)  Platelets 140 - 400 10e3/uL 308 247 298    . CMP Latest Ref Rng & Units 08/11/2016 07/21/2016 07/12/2016  Glucose 70 - 140 mg/dl 118 97 96  BUN 7.0 - 26.0 mg/dL 10.8 12.9 10.5  Creatinine 0.7 - 1.3 mg/dL 0.6(L) 0.6(L) 0.6(L)  Sodium 136 - 145 mEq/L 141 140 139  Potassium 3.5 - 5.1 mEq/L 4.4 4.5 5.0  Chloride 101 - 111 mmol/L - - -  CO2 22 - 29 mEq/L 22 21(L) 24  Calcium 8.4 - 10.4 mg/dL 7.5(L) 7.3(L) 8.4  Total Protein 6.4 - 8.3 g/dL 6.7 6.7 6.1(L)  Total Bilirubin 0.20 - 1.20 mg/dL 0.33 0.32 0.33  Alkaline Phos 40 - 150 U/L 448(H) 639(H) 628(H)  AST 5 - 34 U/L _0 ALT 0 - 55 U/L _1 Component     Latest Ref Rng & Units 05/08/2016 07/21/2016 08/11/2016  WBC     4.0 - 10.3 10e3/uL   7.7  NEUT#     1.5 - 6.5 10e3/uL   2.4  Hemoglobin     13.0 - 17.1 g/dL   10.4 (L)  HCT     38.4 - 49.9 %   34.6 (L)  Platelets      140 - 400 10e3/uL   308  MCV     79.3 - 98.0 fL   92.8  MCH     27.2 - 33.4 pg   27.9  MCHC     32.0 - 36.0 g/dL   30.1 (L)  RBC     4.20 - 5.82 10e6/uL   3.73 (L)  RDW     11.0 - 14.6 %   20.3 (H)  lymph#     0.9 - 3.3 10e3/uL   4.7 (H)  MONO#     0.1 - 0.9 10e3/uL   0.6  Eosinophils Absolute     0.0 - 0.5 10e3/uL   0.1  Basophils Absolute     0.0 - 0.1 10e3/uL   0.0  NEUT%     39.0 - 75.0 %   30.3 (L)  LYMPH%     14.0 - 49.0 %   60.2 (H)  MONO%     0.0 - 14.0 %   8.3  EOS%     0.0 - 7.0 %   0.8  BASO%     0.0 - 2.0 %   0.4  nRBC     0 - 0 %   0  Retic %     0.80 - 1.80 %   2.48 (H)  Retic Ct Abs     34.80 - 93.90 10e3/uL   92.50  Immature Retic Fract     3.00 - 10.60 %   18.30 (H)  Sodium     136 - 145 mEq/L   141  Potassium     3.5 - 5.1 mEq/L   4.4  Chloride     98 - 109 mEq/L   112 (H)  CO2     22 - 29 mEq/L   22  Glucose     70 - 140 mg/dl   118  BUN     7.0 - 26.0 mg/dL   10.8  Creatinine  0.7 - 1.3 mg/dL   0.6 (L)  Total Bilirubin     0.20 - 1.20 mg/dL   0.33  Alkaline Phosphatase     40 - 150 U/L   448 (H)  AST     5 - 34 U/L   25  ALT     0 - 55 U/L   11  Total Protein     6.4 - 8.3 g/dL   6.7  Albumin     3.5 - 5.0 g/dL   4.0  Calcium     8.4 - 10.4 mg/dL   7.5 (L)  Anion gap     3 - 11 mEq/L   8  EGFR     >90 ml/min/1.73 m2   >90  PSA     0.0 - 4.0 ng/mL 549.0 (H) 7.1 (H)   Vitamin D, 25-Hydroxy     30.0 - 100.0 ng/mL  16.2 (L)     4     RADIOGRAPHIC STUDIES: I have personally reviewed the radiological images as listed and agreed with the findings in the report. No results found.  ASSESSMENT & PLAN:   71 yo AAM with previous h/o of locally advanced pT3b,pN0 prostate cancer now with concern for newly noted metastatic prostate cancer.  1) Metastatic Prostate Cancer  PSA level 550 pre-treatment and have improved significantly and are down to 7.1 Patient is noted to have extensive osseous metastatic disease throughout  the spine and bilaterally in the calvarium as well. Also noted to have local recurrence in the prostatic bed, retroperitoneal Lnadenoapathy and some mediastinal LNadenoapathy. No evidence of cord compression or focal neurological symptoms. This would qualify as high volume disease. ECOG PS 1 Previously locally advanced pT3b,pN0 diagnosed in 10/2012 s/p radical prostatectomy and ADT x 1 yr. Testosterone level 7 -- is dropping appropriately with treatment. PSA has dropped to 7.1 which suggests good response. S/p 4 cycles of taxotere. Plan --no prohibitive toxicities from treatment thus far. -grade 1 nail changes. -continue with docetaxel + ADT with G-CSF support -continue depot Lupron q47month (dose due today) -Xgeva for bony mets q 4 weeks (low calcium - given tums before Xgeva) -replacing Vit D aggressively due to significant deficiency and hypocalcemia.- Given new prescription and reminded patient to be compliant with this. -Ergocalciferol 50k units 2 times  weekly x 12 weeks.   2) Elevated alkaline phosphatase level due to extensive bone mets. Improving with treatment. 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 Stable. -no indication for additional PRBC transfusion.  4) Hematuria due to local recurrence of prostate cancer - no gross hematuria reported by patient now.  5) Anorexia with weight loss of about 15-20lbs. Notes improved po intake with Marinol. Patient notes he's been eating much better. . Wt Readings from Last 3 Encounters:  08/11/16 175 lb 9.6 oz (79.7 kg)  07/12/16 177 lb 1 oz (80.3 kg)  05/17/16 183 lb 9.6 oz (83.3 kg)     6) Vit D deficiency with hypocalcemia -Being aggressively replaced since his calcium evels have dropped after Xgeva likely reflecting significant bone calcium deficits from extensive healing bone lesions. ALK PO4 has dropped by 50% -Continue ergocalciferol 50,000U twice weekly. -Calcium levels have now  normalized today. -Repeat 25 hydroxy vitamin D levels on next visit.  7) Neoplasm related pain - much improved patient is has not needed much of his prescribed pain medications Plan -Continue Percocet when necessary for neoplasm related bone pains.refill given today. -Senna  S for bowel prophylaxis  7) Colonic thickening in transverse colon thought to be related to spasm/incomplete distension of colon in this area. -will need colonoscopy at some point with PCP after completion of chemotherapy.  Continue Docetaxel q3 weeks as per orders Continue Lupron q28month (next dose today) Continue Xgeva q4 weeks next dose today RTC with Dr KIrene Limboin 3 weeks with labs C6D1 of taxotere with labs  All of the patients questions were answered with apparent satisfaction. The patient knows to call the clinic with any problems, questions or concerns.  I spent 25 minutes counseling the patient face to face. The total time spent in the appointment was 40 minutes and more than 50% was on counseling and direct patient cares.    GSullivan LoneMD MEverettsAAHIVMS SGrove Place Surgery Center LLCCBaxter Regional Medical CenterHematology/Oncology Physician [facility] (Office):       [phone](Work cell):  [phone](Fax):           [phone]

## 2016-08-14 ENCOUNTER — Telehealth: Payer: Self-pay | Admitting: Hematology

## 2016-08-14 ENCOUNTER — Ambulatory Visit: Payer: Medicare Other

## 2016-08-14 NOTE — Telephone Encounter (Signed)
R/s appts per Dr. Irene Limbo availability  - patient aware

## 2016-08-31 ENCOUNTER — Telehealth: Payer: Self-pay | Admitting: Hematology

## 2016-08-31 ENCOUNTER — Other Ambulatory Visit (HOSPITAL_BASED_OUTPATIENT_CLINIC_OR_DEPARTMENT_OTHER): Payer: Medicare Other

## 2016-08-31 ENCOUNTER — Encounter: Payer: Self-pay | Admitting: Hematology

## 2016-08-31 ENCOUNTER — Ambulatory Visit: Payer: Medicare Other

## 2016-08-31 ENCOUNTER — Ambulatory Visit (HOSPITAL_BASED_OUTPATIENT_CLINIC_OR_DEPARTMENT_OTHER): Payer: Medicare Other | Admitting: Hematology

## 2016-08-31 ENCOUNTER — Ambulatory Visit (HOSPITAL_BASED_OUTPATIENT_CLINIC_OR_DEPARTMENT_OTHER): Payer: Medicare Other

## 2016-08-31 VITALS — BP 123/73 | HR 77 | Temp 98.7°F | Resp 18 | Ht 73.0 in | Wt 177.4 lb

## 2016-08-31 DIAGNOSIS — D6481 Anemia due to antineoplastic chemotherapy: Secondary | ICD-10-CM | POA: Diagnosis not present

## 2016-08-31 DIAGNOSIS — C61 Malignant neoplasm of prostate: Secondary | ICD-10-CM

## 2016-08-31 DIAGNOSIS — R634 Abnormal weight loss: Secondary | ICD-10-CM | POA: Diagnosis not present

## 2016-08-31 DIAGNOSIS — R748 Abnormal levels of other serum enzymes: Secondary | ICD-10-CM | POA: Diagnosis not present

## 2016-08-31 DIAGNOSIS — G893 Neoplasm related pain (acute) (chronic): Secondary | ICD-10-CM | POA: Diagnosis not present

## 2016-08-31 DIAGNOSIS — R6884 Jaw pain: Secondary | ICD-10-CM | POA: Diagnosis not present

## 2016-08-31 DIAGNOSIS — R630 Anorexia: Secondary | ICD-10-CM | POA: Diagnosis not present

## 2016-08-31 DIAGNOSIS — D630 Anemia in neoplastic disease: Secondary | ICD-10-CM | POA: Diagnosis not present

## 2016-08-31 DIAGNOSIS — Z5111 Encounter for antineoplastic chemotherapy: Secondary | ICD-10-CM

## 2016-08-31 DIAGNOSIS — C7951 Secondary malignant neoplasm of bone: Secondary | ICD-10-CM

## 2016-08-31 DIAGNOSIS — Z7189 Other specified counseling: Secondary | ICD-10-CM

## 2016-08-31 DIAGNOSIS — T148XXA Other injury of unspecified body region, initial encounter: Secondary | ICD-10-CM

## 2016-08-31 DIAGNOSIS — K0889 Other specified disorders of teeth and supporting structures: Secondary | ICD-10-CM

## 2016-08-31 DIAGNOSIS — E559 Vitamin D deficiency, unspecified: Secondary | ICD-10-CM | POA: Diagnosis not present

## 2016-08-31 LAB — COMPREHENSIVE METABOLIC PANEL
ALBUMIN: 3.9 g/dL (ref 3.5–5.0)
ALT: 11 U/L (ref 0–55)
AST: 26 U/L (ref 5–34)
Alkaline Phosphatase: 347 U/L — ABNORMAL HIGH (ref 40–150)
Anion Gap: 8 mEq/L (ref 3–11)
BILIRUBIN TOTAL: 0.26 mg/dL (ref 0.20–1.20)
BUN: 15 mg/dL (ref 7.0–26.0)
CALCIUM: 7.7 mg/dL — AB (ref 8.4–10.4)
CO2: 22 mEq/L (ref 22–29)
CREATININE: 0.6 mg/dL — AB (ref 0.7–1.3)
Chloride: 111 mEq/L — ABNORMAL HIGH (ref 98–109)
EGFR: >90 mL/min/{1.73_m2} (ref >90)
GLUCOSE: 119 mg/dL (ref 70–140)
POTASSIUM: 4.1 meq/L (ref 3.5–5.1)
SODIUM: 141 meq/L (ref 136–145)
TOTAL PROTEIN: 6.8 g/dL (ref 6.4–8.3)

## 2016-08-31 LAB — CBC & DIFF AND RETIC
BASO%: 0.7 % (ref 0.0–2.0)
BASOS ABS: 0.1 10*3/uL (ref 0.0–0.1)
EOS ABS: 0.1 10*3/uL (ref 0.0–0.5)
EOS%: 0.6 % (ref 0.0–7.0)
HEMATOCRIT: 33.9 % — AB (ref 38.4–49.9)
HEMOGLOBIN: 10.8 g/dL — AB (ref 13.0–17.1)
IMMATURE RETIC FRACT: 15.5 % — AB (ref 3.00–10.60)
LYMPH%: 47 % (ref 14.0–49.0)
MCH: 28.6 pg (ref 27.2–33.4)
MCHC: 31.9 g/dL — ABNORMAL LOW (ref 32.0–36.0)
MCV: 89.9 fL (ref 79.3–98.0)
MONO#: 0.7 10*3/uL (ref 0.1–0.9)
MONO%: 7 % (ref 0.0–14.0)
NEUT%: 44.7 % (ref 39.0–75.0)
NEUTROS ABS: 4.5 10*3/uL (ref 1.5–6.5)
Platelets: 328 10*3/uL (ref 140–400)
RBC: 3.77 10*6/uL — ABNORMAL LOW (ref 4.20–5.82)
RDW: 21.3 % — AB (ref 11.0–14.6)
RETIC CT ABS: 79.92 10*3/uL (ref 34.80–93.90)
Retic %: 2.12 % — ABNORMAL HIGH (ref 0.80–1.80)
WBC: 10 10*3/uL (ref 4.0–10.3)
lymph#: 4.7 10*3/uL — ABNORMAL HIGH (ref 0.9–3.3)

## 2016-08-31 MED ORDER — DEXAMETHASONE SODIUM PHOSPHATE 10 MG/ML IJ SOLN
INTRAMUSCULAR | Status: AC
  Filled 2016-08-31: qty 1

## 2016-08-31 MED ORDER — DOCETAXEL CHEMO INJECTION 160 MG/16ML
75.0000 mg/m2 | Freq: Once | INTRAVENOUS | Status: AC
  Administered 2016-08-31: 150 mg via INTRAVENOUS
  Filled 2016-08-31: qty 15

## 2016-08-31 MED ORDER — DEXAMETHASONE SODIUM PHOSPHATE 10 MG/ML IJ SOLN
10.0000 mg | Freq: Once | INTRAMUSCULAR | Status: AC
  Administered 2016-08-31: 10 mg via INTRAVENOUS

## 2016-08-31 MED ORDER — SODIUM CHLORIDE 0.9 % IV SOLN
Freq: Once | INTRAVENOUS | Status: AC
  Administered 2016-08-31: 13:00:00 via INTRAVENOUS

## 2016-08-31 MED ORDER — CHLORHEXIDINE GLUCONATE 0.12 % MT SOLN: 15.0000 mL | Freq: Two times a day (BID) | OROMUCOSAL | 0 refills | Status: DC

## 2016-08-31 MED ORDER — OXYCODONE-ACETAMINOPHEN 10-325 MG PO TABS: 1.0000 | ORAL_TABLET | ORAL | 0 refills | Status: DC | PRN

## 2016-08-31 MED ORDER — PEGFILGRASTIM 6 MG/0.6ML ~~LOC~~ PSKT
6.0000 mg | PREFILLED_SYRINGE | Freq: Once | SUBCUTANEOUS | Status: AC
  Administered 2016-08-31: 6 mg via SUBCUTANEOUS
  Filled 2016-08-31: qty 0.6

## 2016-08-31 NOTE — Telephone Encounter (Signed)
Scheduled appt per 6/7 los - Gave patient AVS and calender per 6/7. Patient aware to go to radiology for xray after treatment and per Dr. Enrique Sack office - they will contact patient again.

## 2016-08-31 NOTE — Addendum Note (Signed)
Addended by: Unice Bailey on: 08/31/2016 10:18 AM   Modules accepted: Orders

## 2016-08-31 NOTE — Patient Instructions (Signed)
[facility] Discharge Instructions for Patients Receiving Chemotherapy  Today you received the following chemotherapy agents:  docetaxel (Taxotere).  To help prevent nausea and vomiting after your treatment, we encourage you to take your nausea medication as directed.   If you develop nausea and vomiting that is not controlled by your nausea medication, call the clinic.   BELOW ARE SYMPTOMS THAT SHOULD BE REPORTED IMMEDIATELY:  *FEVER GREATER THAN 100.5 F  *CHILLS WITH OR WITHOUT FEVER  NAUSEA AND VOMITING THAT IS NOT CONTROLLED WITH YOUR NAUSEA MEDICATION  *UNUSUAL SHORTNESS OF BREATH  *UNUSUAL BRUISING OR BLEEDING  TENDERNESS IN MOUTH AND THROAT WITH OR WITHOUT PRESENCE OF ULCERS  *URINARY PROBLEMS  *BOWEL PROBLEMS  UNUSUAL RASH Items with * indicate a potential emergency and should be followed up as soon as possible.  Feel free to call the clinic you have any questions or concerns. The clinic phone number is [phone].  Please show the CHEMO ALERT CARD at check-in to the Emergency Department and triage nurse.   

## 2016-08-31 NOTE — Patient Instructions (Signed)
Thank you for choosing [facility] to provide your oncology and hematology care.  To afford each patient quality time with our providers, please arrive 30 minutes before your scheduled appointment time.  If you arrive late for your appointment, you may be asked to reschedule.  We strive to give you quality time with our providers, and arriving late affects you and other patients whose appointments are after yours.   If you are a no show for multiple scheduled visits, you may be dismissed from the clinic at the providers discretion.    Again, thank you for choosing [facility], our hope is that these requests will decrease the amount of time that you wait before being seen by our physicians.  ______________________________________________________________________  Should you have questions after your visit to the  Cancer Center, please contact our office at [phone] between the hours of 8:30 and 4:30 p.m.    Voicemails left after 4:30p.m will not be returned until the following business day.    For prescription refill requests, please have your pharmacy contact us directly.  Please also try to allow 48 hours for prescription requests.    Please contact the scheduling department for questions regarding scheduling.  For scheduling of procedures such as PET scans, CT scans, MRI, Ultrasound, etc please contact central scheduling at [phone].    Resources For Cancer Patients and Caregivers:   Oncolink.org:  A wonderful resource for patients and healthcare providers for information regarding your disease, ways to tract your treatment, what to expect, etc.     American Cancer Society:  800-227-2345  Can help patients locate various types of support and financial assistance  Cancer Care: 1-800-813-HOPE (4673) Provides financial assistance, online support groups, medication/co-pay assistance.    [place] DSS:  336-641-3447 Where to apply for food  stamps, Medicaid, and utility assistance  Medicare Rights Center: 800-333-4114 Helps people with Medicare understand their rights and benefits, navigate the Medicare system, and secure the quality healthcare they deserve  SCAT: 336-333-6589 [employer] [employer]'s shared-ride transportation service for eligible riders who have a disability that prevents them from riding the fixed route bus.    For additional information on assistance programs please contact our social worker:   Grier Hock/Abigail Elmore:  [phone]            

## 2016-09-01 ENCOUNTER — Ambulatory Visit: Payer: Medicare Other

## 2016-09-01 LAB — PSA: Prostate Specific Ag, Serum: 2 ng/mL (ref 0.0–4.0)

## 2016-09-01 LAB — VITAMIN D 25 HYDROXY (VIT D DEFICIENCY, FRACTURES): Vitamin D, 25-Hydroxy: 42.3 ng/mL (ref 30.0–100.0)

## 2016-09-08 ENCOUNTER — Ambulatory Visit (HOSPITAL_COMMUNITY)
Admission: RE | Admit: 2016-09-08 | Discharge: 2016-09-08 | Disposition: A | Discharge status: Home / Self Care | Payer: Medicare Other | Source: Ambulatory Visit | Attending: Hematology | Admitting: Hematology

## 2016-09-08 ENCOUNTER — Ambulatory Visit: Payer: Medicare Other

## 2016-09-08 DIAGNOSIS — K0889 Other specified disorders of teeth and supporting structures: Secondary | ICD-10-CM | POA: Diagnosis not present

## 2016-09-08 DIAGNOSIS — Z7189 Other specified counseling: Secondary | ICD-10-CM

## 2016-09-08 DIAGNOSIS — C61 Malignant neoplasm of prostate: Secondary | ICD-10-CM

## 2016-09-08 DIAGNOSIS — R6884 Jaw pain: Secondary | ICD-10-CM | POA: Diagnosis not present

## 2016-09-08 DIAGNOSIS — C7951 Secondary malignant neoplasm of bone: Secondary | ICD-10-CM

## 2016-09-08 MED ORDER — LEUPROLIDE ACETATE (3 MONTH) 22.5 MG IM KIT: 22.5000 mg | PACK | Freq: Once | INTRAMUSCULAR | Status: DC

## 2016-09-08 MED ORDER — DENOSUMAB 120 MG/1.7ML ~~LOC~~ SOLN: 120.0000 mg | Freq: Once | SUBCUTANEOUS | Status: DC

## 2016-09-08 NOTE — Progress Notes (Signed)
Pt didn't get Xgeva inj per Dr. Benay Spice due to Calcium levels being 7.7. Porsche Cates LPN

## 2016-09-26 NOTE — Progress Notes (Signed)
[name]    HEMATOLOGY/ONCOLOGY CLINIC NOTE  Date of Service: .08/31/2016  Patient Care Team: [name], NP as PCP - General (Internal Medicine)  CHIEF COMPLAINTS/PURPOSE OF CONSULTATION:  f/u Multiple spinal bone mets concerning for metastatic prostate cancer  HISTORY OF PRESENTING ILLNESS:  Eric Buckner. is a wonderful 71 y.o. male who has been referred to [place] from the [facility] ED by [name] PA-C for evaluation and management of metastatic malignancy concerning for metastatic prostate cancer.  Patient has had a h/o locally advanced prostate cancer diagnosed in 2014 and had a radical prostatectomy and pelvic LN dissection by Dr [name] in 10/2012. Pathology showed pT3b pN0 disease (with positive margins, extraprostatic extension, seminal vesicle involvement and angiolymphatic invasion). Bone scan was negative for metastatic disease. Patient report no post-op RT. He notes that he received lupron shots as per his urologist for 1 year post-operatively. He was being monitored by Dr [name] and last had clinical evaluation and PSA about 47months ago - PSA level not available in our system. He notes that he was lost to f/u since then.  Patient presented to the ED with worsening lower and middle back pain for 2 weeks. Also notes about 15-20lbs weight loss and anorexia over the last 6-8weeks. He also notes hematuria. No fevers/chills. Patient had a CT abd/pelvis in the ED which showed Innumerable sclerotic lesions in all imaged bones consistent metastatic disease. The prostate gland has an irregular appearance and the patient may have prostate carcinoma. A few enlarged retroperitoneal lymph nodes are identified worrisome for additional foci of metastatic disease.  Patient's pain was somewhat controlled with prn percocet in ED and he was discharged home with f/u with [place]. Patient notes that the patient wakes him up in the night.  No urinary retention. No loss of bowel or bladder  control. No new neurological symptoms in his extremities.   INTERVAL HISTORY  Patient is here to follow up prior to his 6th cycle of Taxotere with G-CSF support. Patient notes that he has been feeling well and has only been using pain medications infrequently as needed for his pain from bony metastases. He notes he is doing well overall and has been trying to keep quite active. No fevers/chills. 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 No new urinary symptoms.   MEDICAL HISTORY:  Past Medical History:  Diagnosis Date  . Arthritis    left knee, left shoulder  . Erectile dysfunction 06/25/2012  . Foley catheter in place   . Goiter 06/25/2012  . H/O hiatal hernia   . Hematuria    occasional  . Hemorrhoid   . History of bladder infections   . Joint pain     SURGICAL HISTORY: Past Surgical History:  Procedure Laterality Date  . INGUINAL HERNIA REPAIR  8 yrs ago  . LYMPHADENECTOMY Bilateral 11/20/2012   Procedure: WITH BILATERAL PELVIC LYMPH NODE DISSECTION ;  Surgeon: [name], MD;  Location: [facility];  Service: Urology;  Laterality: Bilateral;  . ROBOT ASSISTED LAPAROSCOPIC RADICAL PROSTATECTOMY N/A 11/20/2012   Procedure: ROBOTIC ASSISTED LAPAROSCOPIC RADICAL PROSTATECTOMY;  Surgeon: [name], MD;  Location: [facility];  Service: Urology;  Laterality: N/A;    SOCIAL HISTORY: Social History   Social History  . Marital status: Divorced    Spouse name: N/A  . Number of children: N/A  . Years of education: 9   Occupational History  . Retired     Optician, dispensing  History Main Topics  . Smoking status: Former Smoker    Packs/day: 0.25    Years: 25.00    Types: Cigarettes  . Smokeless tobacco: Former Systems developer     Comment: quit date 2016  . Alcohol use 3.6 oz/week    6 Cans of beer per week     Comment: occasional beer   . Drug use: Yes    Types: Marijuana     Comment: 3 x a month uses marijuana  . Sexual activity: Yes   Other Topics Concern  .  Not on file   Social History Narrative   Regular exercise-no   Caffeine Use-yes    FAMILY HISTORY: Family History  Problem Relation Age of Onset  . Heart disease Sister   . Stroke Sister   . Colon cancer Paternal Uncle   . Diabetes Neg Hx     ALLERGIES:  is allergic to heparin and poison ivy extract [poison ivy extract].  MEDICATIONS:  Current Outpatient Prescriptions  Medication Sig Dispense Refill  . dexamethasone (DECADRON) 4 MG tablet Take 2 tablets (8 mg total) by mouth 2 (two) times daily. Start the day before Taxotere. Then daily after chemo for 2 days. 30 tablet 1  . dronabinol (MARINOL) 10 MG capsule Take 1 capsule (10 mg total) by mouth 2 (two) times daily before lunch and supper. 60 capsule 0  . ondansetron (ZOFRAN) 8 MG tablet Take 1 tablet (8 mg total) by mouth 2 (two) times daily as needed for refractory nausea / vomiting. 30 tablet 1  . oxyCODONE-acetaminophen (PERCOCET) 10-325 MG tablet Take 1 tablet by mouth every 4 (four) hours as needed for pain. 60 tablet 0  . prochlorperazine (COMPAZINE) 10 MG tablet Take 1 tablet (10 mg total) by mouth every 6 (six) hours as needed (Nausea or vomiting). 30 tablet 1  . Vitamin D, Ergocalciferol, (DRISDOL) 50000 units CAPS capsule Take 1 capsule (50,000 Units total) by mouth 2 (two) times a week. 24 capsule 0  . chlorhexidine (PERIDEX) 0.12 % solution Use as directed 15 mLs in the mouth or throat 2 (two) times daily. Swish and spit. Do not swallow. 473 mL 0   No current facility-administered medications for this visit.     REVIEW OF SYSTEMS:    10 Point review of Systems was done is negative except as noted above.  PHYSICAL EXAMINATION: ECOG PERFORMANCE STATUS: 2 - Symptomatic, <50% confined to bed  . Vitals:   08/31/16 1151  BP: 123/73  Pulse: 77  Resp: 18  Temp: 98.7 F (37.1 C)   Filed Weights   08/31/16 1151  Weight: 177 lb 6.4 oz (80.5 kg)   .Body mass index is 23.41 kg/m.   . Wt Readings from Last 3  Encounters:  08/31/16 177 lb 6.4 oz (80.5 kg)  08/11/16 175 lb 9.6 oz (79.7 kg)  07/12/16 177 lb 1 oz (80.3 kg)   GENERAL:alert, in no acute distress and comfortable SKIN: skin color, texture, turgor are normal, no rashes or significant lesions EYES: normal, conjunctiva are pink and non-injected, sclera clear OROPHARYNX:no exudate, no erythema and lips, buccal mucosa, and tongue normal  NECK: supple, no JVD, thyroid normal size, non-tender, without nodularity LYMPH:  no palpable lymphadenopathy in the cervical, axillary or inguinal LUNGS: clear to auscultation with normal respiratory effort HEART: regular rate & rhythm,  no murmurs and no lower extremity edema ABDOMEN: abdomen soft, non-tender, normoactive bowel sounds  Musculoskeletal: No palpable areas of tenderness over the spine  PSYCH: alert &  oriented x 3 with fluent speech NEURO: no focal motor/sensory deficits  LABORATORY DATA:  I have reviewed the data as listed  . CBC Latest Ref Rng & Units 08/31/2016 08/11/2016 07/21/2016  WBC 4.0 - 10.3 10e3/uL 10.0 7.7 7.5  Hemoglobin 13.0 - 17.1 g/dL 10.8(L) 10.4(L) 10.2(L)  Hematocrit 38.4 - 49.9 % 33.9(L) 34.6(L) 33.2(L)  Platelets 140 - 400 10e3/uL 328 308 247    . CMP Latest Ref Rng & Units 08/31/2016 08/11/2016 07/21/2016  Glucose 70 - 140 mg/dl 119 118 97  BUN 7.0 - 26.0 mg/dL 15.0 10.8 12.9  Creatinine 0.7 - 1.3 mg/dL 0.6(L) 0.6(L) 0.6(L)  Sodium 136 - 145 mEq/L 141 141 140  Potassium 3.5 - 5.1 mEq/L 4.1 4.4 4.5  Chloride 101 - 111 mmol/L - - -  CO2 22 - 29 mEq/L 22 22 21(L)  Calcium 8.4 - 10.4 mg/dL 7.7(L) 7.5(L) 7.3(L)  Total Protein 6.4 - 8.3 g/dL 6.8 6.7 6.7  Total Bilirubin 0.20 - 1.20 mg/dL 0.26 0.33 0.32  Alkaline Phos 40 - 150 U/L 347(H) 448(H) 639(H)  AST 5 - 34 U/L 26 25 25   ALT 0 - 55 U/L 11 11 13             RADIOGRAPHIC STUDIES: I have personally reviewed the radiological images as listed and agreed with the findings in the report. Dg  Orthopantogram  Result Date: 09/08/2016 CLINICAL DATA:  Anterior mandibular pain and redness EXAM: ORTHOPANTOGRAM/PANORAMIC COMPARISON:  CT neck 08/05/2011 FINDINGS: The patient is edentulous with the exception of teeth 23,24,25 and 26. These show caries and periodontal disease. No evidence of abscess, though there is some symphyseal blurring has typical of a Panorex. No other bone finding. IMPRESSION: Residual teeth 23-26 with decay.  No mandibular osseous lesion seen. Electronically Signed   By: Nelson Chimes M.D.   On: 09/08/2016 08:46    ASSESSMENT & PLAN:   71 yo AAM with previous h/o of locally advanced pT3b,pN0 prostate cancer now with concern for newly noted metastatic prostate cancer.  1) Metastatic Prostate Cancer  PSA level 550 pre-treatment and have improved significantly and are down to 2 Patient is noted to have extensive osseous metastatic disease throughout the spine and bilaterally in the calvarium as well. Also noted to have local recurrence in the prostatic bed, retroperitoneal Lnadenoapathy and some mediastinal LNadenoapathy. No evidence of cord compression or focal neurological symptoms. This would qualify as high volume disease. ECOG PS 1 Previously locally advanced pT3b,pN0 diagnosed in 10/2012 s/p radical prostatectomy and ADT x 1 yr. Testosterone level 7 -- is dropping appropriately with treatment. PSA has dropped to 7.1 which suggests good response. S/p 5 cycles of taxotere. Plan --no prohibitive toxicities from treatment thus far. -grade 1 nail changes. -he will complete his last planned 6th cycle of docetaxel with G-CSF support -Xgeva for bony mets q 4 weeks  -continue Lupron q3 months -replacing Vit D aggressively due to significant deficiency and hypocalcemia.- Given new prescription and reminded patient to be compliant with this.   2) Antierior mandibular pain  Orthopantogram done - showed IMPRESSION: Residual teeth 23-26 with decay.  No mandibular osseous  lesion seen Plan -dental referral given  2) Elevated alkaline phosphatase level due to extensive bone mets. Improving with treatment. 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 Stable. -no indication for additional PRBC transfusion.  4) Hematuria due to local recurrence of prostate cancer - no gross hematuria reported by patient now.  5) Anorexia  with weight loss of about 15-20lbs. Notes improved po intake with Marinol. Patient notes he's been eating much better. . Wt Readings from Last 3 Encounters:  08/31/16 177 lb 6.4 oz (80.5 kg)  08/11/16 175 lb 9.6 oz (79.7 kg)  07/12/16 177 lb 1 oz (80.3 kg)     6) Vit D deficiency with hypocalcemia -- levels have now normalized  -Being aggressively replaced since his calcium evels have dropped after Xgeva likely reflecting significant bone calcium deficits from extensive healing bone lesions. -Continue ergocalciferol 50,000U -reduce to weekly  7) Neoplasm related pain - much improved patient is has not needed much of his prescribed pain medications Plan -Continue Percocet when necessary for neoplasm related bone pains.refill given today. -Senna S for bowel prophylaxis  8) Colonic thickening in transverse colon thought to be related to spasm/incomplete distension of colon in this area. -will need colonoscopy at some point with PCP after completion of chemotherapy.  Dental Xray today Dental referral for dental pain Continue Xgeva q4 weeks Continue Lupron q81months Complete last cycle of Docetaxel RTC with Dr Irene Limbo in 2 months on same day as scheduled Xgeva  All of the patients questions were answered with apparent satisfaction. The patient knows to call the clinic with any problems, questions or concerns.  I spent 272minutes counseling the patient face to face. The total time spent in the appointment was 40 minutes and more than 50% was on counseling and direct patient cares.    Sullivan Lone  MD [place] AAHIVMS [facility] Hematology/Oncology Physician [facility]  (Office):       [phone] (Work cell):  [phone] (Fax):           [phone]

## 2016-10-04 ENCOUNTER — Other Ambulatory Visit: Payer: Self-pay

## 2016-10-04 ENCOUNTER — Other Ambulatory Visit: Payer: Self-pay | Admitting: Hematology and Oncology

## 2016-10-04 ENCOUNTER — Ambulatory Visit: Payer: Medicare Other

## 2016-10-04 ENCOUNTER — Other Ambulatory Visit (HOSPITAL_BASED_OUTPATIENT_CLINIC_OR_DEPARTMENT_OTHER): Payer: Medicare Other

## 2016-10-04 DIAGNOSIS — C61 Malignant neoplasm of prostate: Secondary | ICD-10-CM

## 2016-10-04 DIAGNOSIS — C7951 Secondary malignant neoplasm of bone: Secondary | ICD-10-CM

## 2016-10-04 LAB — CBC WITH DIFFERENTIAL/PLATELET
BASO%: 0.3 % (ref 0.0–2.0)
Basophils Absolute: 0 10*3/uL (ref 0.0–0.1)
EOS%: 1.3 % (ref 0.0–7.0)
Eosinophils Absolute: 0.1 10*3/uL (ref 0.0–0.5)
HEMATOCRIT: 37.7 % — AB (ref 38.4–49.9)
HEMOGLOBIN: 11.3 g/dL — AB (ref 13.0–17.1)
LYMPH#: 5.3 10*3/uL — AB (ref 0.9–3.3)
LYMPH%: 70 % — ABNORMAL HIGH (ref 14.0–49.0)
MCH: 27.3 pg (ref 27.2–33.4)
MCHC: 30 g/dL — AB (ref 32.0–36.0)
MCV: 91.1 fL (ref 79.3–98.0)
MONO#: 0.4 10*3/uL (ref 0.1–0.9)
MONO%: 4.7 % (ref 0.0–14.0)
NEUT%: 23.7 % — ABNORMAL LOW (ref 39.0–75.0)
NEUTROS ABS: 1.8 10*3/uL (ref 1.5–6.5)
Platelets: 231 10*3/uL (ref 140–400)
RBC: 4.14 10*6/uL — ABNORMAL LOW (ref 4.20–5.82)
RDW: 18.4 % — AB (ref 11.0–14.6)
WBC: 7.6 10*3/uL (ref 4.0–10.3)

## 2016-10-04 LAB — COMPREHENSIVE METABOLIC PANEL
ALBUMIN: 4.1 g/dL (ref 3.5–5.0)
ALK PHOS: 404 U/L — AB (ref 40–150)
ALT: 45 U/L (ref 0–55)
AST: 54 U/L — AB (ref 5–34)
Anion Gap: 7 mEq/L (ref 3–11)
BILIRUBIN TOTAL: 0.34 mg/dL (ref 0.20–1.20)
BUN: 17.6 mg/dL (ref 7.0–26.0)
CALCIUM: 7.9 mg/dL — AB (ref 8.4–10.4)
CHLORIDE: 110 meq/L — AB (ref 98–109)
CO2: 24 mEq/L (ref 22–29)
CREATININE: 0.7 mg/dL (ref 0.7–1.3)
EGFR: >90 mL/min/{1.73_m2} (ref >90)
Glucose: 87 mg/dl (ref 70–140)
Potassium: 4.4 mEq/L (ref 3.5–5.1)
Sodium: 140 mEq/L (ref 136–145)
Total Protein: 6.9 g/dL (ref 6.4–8.3)

## 2016-10-04 MED ORDER — OXYCODONE-ACETAMINOPHEN 10-325 MG PO TABS: 1.0000 | ORAL_TABLET | ORAL | 0 refills | Status: DC | PRN

## 2016-10-04 NOTE — Progress Notes (Signed)
Calcium noted at 7.4 today. Injection held per pharmacy advise. Pt instructed to increased calcium intake, encouraged pt to take calcium supplement every day in order to be able to receive Xgeva injection. Pt verbalized understanding of instructions.

## 2016-11-14 ENCOUNTER — Telehealth: Payer: Self-pay | Admitting: Hematology

## 2016-11-14 ENCOUNTER — Ambulatory Visit (HOSPITAL_BASED_OUTPATIENT_CLINIC_OR_DEPARTMENT_OTHER): Payer: Medicare Other

## 2016-11-14 ENCOUNTER — Ambulatory Visit (HOSPITAL_BASED_OUTPATIENT_CLINIC_OR_DEPARTMENT_OTHER): Payer: Medicare Other | Admitting: Hematology

## 2016-11-14 ENCOUNTER — Other Ambulatory Visit (HOSPITAL_BASED_OUTPATIENT_CLINIC_OR_DEPARTMENT_OTHER): Payer: Medicare Other

## 2016-11-14 ENCOUNTER — Encounter: Payer: Self-pay | Admitting: Hematology

## 2016-11-14 VITALS — BP 121/74 | HR 84 | Temp 98.5°F | Resp 17 | Ht 73.0 in | Wt 196.4 lb

## 2016-11-14 DIAGNOSIS — C61 Malignant neoplasm of prostate: Secondary | ICD-10-CM

## 2016-11-14 DIAGNOSIS — D630 Anemia in neoplastic disease: Secondary | ICD-10-CM | POA: Diagnosis not present

## 2016-11-14 DIAGNOSIS — G893 Neoplasm related pain (acute) (chronic): Secondary | ICD-10-CM | POA: Diagnosis not present

## 2016-11-14 DIAGNOSIS — R634 Abnormal weight loss: Secondary | ICD-10-CM

## 2016-11-14 DIAGNOSIS — E559 Vitamin D deficiency, unspecified: Secondary | ICD-10-CM | POA: Diagnosis not present

## 2016-11-14 DIAGNOSIS — C7951 Secondary malignant neoplasm of bone: Secondary | ICD-10-CM

## 2016-11-14 DIAGNOSIS — R630 Anorexia: Secondary | ICD-10-CM

## 2016-11-14 DIAGNOSIS — R748 Abnormal levels of other serum enzymes: Secondary | ICD-10-CM

## 2016-11-14 DIAGNOSIS — Z5111 Encounter for antineoplastic chemotherapy: Secondary | ICD-10-CM

## 2016-11-14 DIAGNOSIS — R6884 Jaw pain: Secondary | ICD-10-CM

## 2016-11-14 DIAGNOSIS — D6481 Anemia due to antineoplastic chemotherapy: Secondary | ICD-10-CM

## 2016-11-14 DIAGNOSIS — Z7189 Other specified counseling: Secondary | ICD-10-CM

## 2016-11-14 DIAGNOSIS — R319 Hematuria, unspecified: Secondary | ICD-10-CM | POA: Diagnosis not present

## 2016-11-14 DIAGNOSIS — K0889 Other specified disorders of teeth and supporting structures: Secondary | ICD-10-CM

## 2016-11-14 LAB — CBC & DIFF AND RETIC
BASO%: 0.1 % (ref 0.0–2.0)
BASOS ABS: 0 10*3/uL (ref 0.0–0.1)
EOS ABS: 0.1 10*3/uL (ref 0.0–0.5)
EOS%: 1.5 % (ref 0.0–7.0)
HEMATOCRIT: 37 % — AB (ref 38.4–49.9)
HEMOGLOBIN: 11.3 g/dL — AB (ref 13.0–17.1)
IMMATURE RETIC FRACT: 6.9 % (ref 3.00–10.60)
LYMPH#: 5.9 10*3/uL — AB (ref 0.9–3.3)
LYMPH%: 73.4 % — ABNORMAL HIGH (ref 14.0–49.0)
MCH: 27.3 pg (ref 27.2–33.4)
MCHC: 30.5 g/dL — ABNORMAL LOW (ref 32.0–36.0)
MCV: 89.4 fL (ref 79.3–98.0)
MONO#: 0.5 10*3/uL (ref 0.1–0.9)
MONO%: 6.2 % (ref 0.0–14.0)
NEUT#: 1.5 10*3/uL (ref 1.5–6.5)
NEUT%: 18.8 % — ABNORMAL LOW (ref 39.0–75.0)
Platelets: 211 10*3/uL (ref 140–400)
RBC: 4.14 10*6/uL — ABNORMAL LOW (ref 4.20–5.82)
RDW: 16.8 % — AB (ref 11.0–14.6)
RETIC %: 1.27 % (ref 0.80–1.80)
Retic Ct Abs: 52.58 10*3/uL (ref 34.80–93.90)
WBC: 8 10*3/uL (ref 4.0–10.3)

## 2016-11-14 LAB — COMPREHENSIVE METABOLIC PANEL
ALBUMIN: 3.8 g/dL (ref 3.5–5.0)
ALK PHOS: 374 U/L — AB (ref 40–150)
ALT: 23 U/L (ref 0–55)
AST: 45 U/L — AB (ref 5–34)
Anion Gap: 6 mEq/L (ref 3–11)
BUN: 10.3 mg/dL (ref 7.0–26.0)
CALCIUM: 8.9 mg/dL (ref 8.4–10.4)
CO2: 27 mEq/L (ref 22–29)
Chloride: 109 mEq/L (ref 98–109)
Creatinine: 0.7 mg/dL (ref 0.7–1.3)
EGFR: >90 mL/min/{1.73_m2} (ref >90)
GLUCOSE: 89 mg/dL (ref 70–140)
Potassium: 4.1 mEq/L (ref 3.5–5.1)
Sodium: 142 mEq/L (ref 136–145)
Total Bilirubin: 0.35 mg/dL (ref 0.20–1.20)
Total Protein: 6.3 g/dL — ABNORMAL LOW (ref 6.4–8.3)

## 2016-11-14 MED ORDER — OXYCODONE-ACETAMINOPHEN 10-325 MG PO TABS: 1.0000 | ORAL_TABLET | ORAL | 0 refills | Status: DC | PRN

## 2016-11-14 MED ORDER — LEUPROLIDE ACETATE (3 MONTH) 22.5 MG IM KIT
22.5000 mg | PACK | Freq: Once | INTRAMUSCULAR | Status: AC
  Administered 2016-11-14: 22.5 mg via INTRAMUSCULAR
  Filled 2016-11-14: qty 22.5

## 2016-11-14 NOTE — Telephone Encounter (Signed)
Scheduled appt per 8/21 los - Gave patient AVS and calender per los. Per Lattie Haw at Dr. Enrique Sack office to show Dr. - patient has already been seen with Dr. Enrique Sack.

## 2016-11-14 NOTE — Patient Instructions (Signed)
Thank you for choosing [facility] to provide your oncology and hematology care.  To afford each patient quality time with our providers, please arrive 30 minutes before your scheduled appointment time.  If you arrive late for your appointment, you may be asked to reschedule.  We strive to give you quality time with our providers, and arriving late affects you and other patients whose appointments are after yours.   If you are a no show for multiple scheduled visits, you may be dismissed from the clinic at the providers discretion.    Again, thank you for choosing [facility], our hope is that these requests will decrease the amount of time that you wait before being seen by our physicians.  ______________________________________________________________________  Should you have questions after your visit to the  Cancer Center, please contact our office at [phone] between the hours of 8:30 and 4:30 p.m.    Voicemails left after 4:30p.m will not be returned until the following business day.    For prescription refill requests, please have your pharmacy contact us directly.  Please also try to allow 48 hours for prescription requests.    Please contact the scheduling department for questions regarding scheduling.  For scheduling of procedures such as PET scans, CT scans, MRI, Ultrasound, etc please contact central scheduling at [phone].    Resources For Cancer Patients and Caregivers:   Oncolink.org:  A wonderful resource for patients and healthcare providers for information regarding your disease, ways to tract your treatment, what to expect, etc.     American Cancer Society:  800-227-2345  Can help patients locate various types of support and financial assistance  Cancer Care: 1-800-813-HOPE (4673) Provides financial assistance, online support groups, medication/co-pay assistance.    [place] DSS:  336-641-3447 Where to apply for food  stamps, Medicaid, and utility assistance  Medicare Rights Center: 800-333-4114 Helps people with Medicare understand their rights and benefits, navigate the Medicare system, and secure the quality healthcare they deserve  SCAT: 336-333-6589 [place] [employer]'s shared-ride transportation service for eligible riders who have a disability that prevents them from riding the fixed route bus.    For additional information on assistance programs please contact our social worker:   Grier Hock/Abigail Elmore:  [phone]            

## 2016-11-14 NOTE — Telephone Encounter (Signed)
Patient has NOT been seen by Dr. Enrique Sack - patient never responded.

## 2016-11-14 NOTE — Patient Instructions (Signed)
Leuprolide depot injection What is this medicine? LEUPROLIDE (loo PROE lide) is a man-made protein that acts like a natural hormone in the body. It decreases testosterone in men and decreases estrogen in women. In men, this medicine is used to treat advanced prostate cancer. In women, some forms of this medicine may be used to treat endometriosis, uterine fibroids, or other male hormone-related problems. This medicine may be used for other purposes; ask your health care provider or pharmacist if you have questions. COMMON BRAND NAME(S): Eligard, Lupron Depot, Lupron Depot-Ped, Viadur What should I tell my health care provider before I take this medicine? They need to know if you have any of these conditions: -diabetes -heart disease or previous heart attack -high blood pressure -high cholesterol -mental illness -osteoporosis -pain or difficulty passing urine -seizures -spinal cord metastasis -stroke -suicidal thoughts, plans, or attempt; a previous suicide attempt by you or a family member -tobacco smoker -unusual vaginal bleeding (women) -an unusual or allergic reaction to leuprolide, benzyl alcohol, other medicines, foods, dyes, or preservatives -pregnant or trying to get pregnant -breast-feeding How should I use this medicine? This medicine is for injection into a muscle or for injection under the skin. It is given by a health care professional in a hospital or clinic setting. The specific product will determine how it will be given to you. Make sure you understand which product you receive and how often you will receive it. Talk to your pediatrician regarding the use of this medicine in children. Special care may be needed. Overdosage: If you think you have taken too much of this medicine contact a poison control center or emergency room at once. NOTE: This medicine is only for you. Do not share this medicine with others. What if I miss a dose? It is important not to miss a dose.  Call your doctor or health care professional if you are unable to keep an appointment. Depot injections: Depot injections are given either once-monthly, every 12 weeks, every 16 weeks, or every 24 weeks depending on the product you are prescribed. The product you are prescribed will be based on if you are male or male, and your condition. Make sure you understand your product and dosing. What may interact with this medicine? Do not take this medicine with any of the following medications: -chasteberry This medicine may also interact with the following medications: -herbal or dietary supplements, like black cohosh or DHEA -male hormones, like estrogens or progestins and birth control pills, patches, rings, or injections -male hormones, like testosterone This list may not describe all possible interactions. Give your health care provider a list of all the medicines, herbs, non-prescription drugs, or dietary supplements you use. Also tell them if you smoke, drink alcohol, or use illegal drugs. Some items may interact with your medicine. What should I watch for while using this medicine? Visit your doctor or health care professional for regular checks on your progress. During the first weeks of treatment, your symptoms may get worse, but then will improve as you continue your treatment. You may get hot flashes, increased bone pain, increased difficulty passing urine, or an aggravation of nerve symptoms. Discuss these effects with your doctor or health care professional, some of them may improve with continued use of this medicine. Male patients may experience a menstrual cycle or spotting during the first months of therapy with this medicine. If this continues, contact your doctor or health care professional. What side effects may I notice from receiving this medicine? Side   effects that you should report to your doctor or health care professional as soon as possible: -allergic reactions like skin  rash, itching or hives, swelling of the face, lips, or tongue -breathing problems -chest pain -depression or memory disorders -pain in your legs or groin -pain at site where injected or implanted -seizures -severe headache -swelling of the feet and legs -suicidal thoughts or other mood changes -visual changes -vomiting Side effects that usually do not require medical attention (report to your doctor or health care professional if they continue or are bothersome): -breast swelling or tenderness -decrease in sex drive or performance -diarrhea -hot flashes -loss of appetite -muscle, joint, or bone pains -nausea -redness or irritation at site where injected or implanted -skin problems or acne This list may not describe all possible side effects. Call your doctor for medical advice about side effects. You may report side effects to FDA at 1-800-FDA-1088. Where should I keep my medicine? This drug is given in a hospital or clinic and will not be stored at home. NOTE: This sheet is a summary. It may not cover all possible information. If you have questions about this medicine, talk to your doctor, pharmacist, or health care provider.  2018 Elsevier/Gold Standard (2015-08-26 09:45:53)  

## 2016-11-15 LAB — VITAMIN D 25 HYDROXY (VIT D DEFICIENCY, FRACTURES): Vitamin D, 25-Hydroxy: 37.2 ng/mL (ref 30.0–100.0)

## 2016-11-15 LAB — PSA: PROSTATE SPECIFIC AG, SERUM: 0.6 ng/mL (ref 0.0–4.0)

## 2016-11-20 NOTE — Progress Notes (Signed)
[name]    HEMATOLOGY/ONCOLOGY CLINIC NOTE  Date of Service: .11/14/2016  Patient Care Team: [name], NP as PCP - General (Internal Medicine)  CHIEF COMPLAINTS/PURPOSE OF CONSULTATION:  f/u Multiple spinal bone mets concerning for metastatic prostate cancer  HISTORY OF PRESENTING ILLNESS:  Eric Osborn. is a wonderful 71 y.o. male who has been referred to [place] from the [facility] long hospital ED by [name] PA-C for evaluation and management of metastatic malignancy concerning for metastatic prostate cancer.  Patient has had a h/o locally advanced prostate cancer diagnosed in 2014 and had a radical prostatectomy and pelvic LN dissection by Dr [name] in 10/2012. Pathology showed pT3b pN0 disease (with positive margins, extraprostatic extension, seminal vesicle involvement and angiolymphatic invasion). Bone scan was negative for metastatic disease. Patient report no post-op RT. He notes that he received lupron shots as per his urologist for 1 year post-operatively. He was being monitored by Dr [name] and last had clinical evaluation and PSA about 91months ago - PSA level not available in our system. He notes that he was lost to f/u since then.  Patient presented to the ED with worsening lower and middle back pain for 2 weeks. Also notes about 15-20lbs weight loss and anorexia over the last 6-8weeks. He also notes hematuria. No fevers/chills. Patient had a CT abd/pelvis in the ED which showed Innumerable sclerotic lesions in all imaged bones consistent metastatic disease. The prostate gland has an irregular appearance and the patient may have prostate carcinoma. A few enlarged retroperitoneal lymph nodes are identified worrisome for additional foci of metastatic disease.  Patient's pain was somewhat controlled with prn percocet in ED and he was discharged home with f/u with [place]. Patient notes that the patient wakes him up in the night.  No urinary retention. No loss of bowel or bladder  control. No new neurological symptoms in his extremities.   INTERVAL HISTORY  Patient is here to follow up of his metastatic prostatic cancer. He notes he is doing well and has tried to remain physically active. Not needing much pain medication. No new urinary symptoms. PSA level down to 0.6 Due for lupron shot today. Notes issues with lower jaw teeth with dental pain. Has not followed up with previous dental referrals. Given new referral again and counseled him to make sure he follows up. Delton See was held during the last 2 doses due to lower calcium levels and now till he has necessary dental intervention. He has been eating well and has gained back a lot of the weight he previously lost.  MEDICAL HISTORY:  Past Medical History:  Diagnosis Date  . Arthritis    left knee, left shoulder  . Erectile dysfunction 06/25/2012  . Foley catheter in place   . Goiter 06/25/2012  . H/O hiatal hernia   . Hematuria    occasional  . Hemorrhoid   . History of bladder infections   . Joint pain     SURGICAL HISTORY: Past Surgical History:  Procedure Laterality Date  . INGUINAL HERNIA REPAIR  8 yrs ago  . LYMPHADENECTOMY Bilateral 11/20/2012   Procedure: WITH BILATERAL PELVIC LYMPH NODE DISSECTION ;  Surgeon: [name], MD;  Location: [facility];  Service: Urology;  Laterality: Bilateral;  . ROBOT ASSISTED LAPAROSCOPIC RADICAL PROSTATECTOMY N/A 11/20/2012   Procedure: ROBOTIC ASSISTED LAPAROSCOPIC RADICAL PROSTATECTOMY;  Surgeon: [name], MD;  Location: [facility];  Service: Urology;  Laterality: N/A;    SOCIAL HISTORY: Social History   Social  History  . Marital status: Divorced    Spouse name: N/A  . Number of children: N/A  . Years of education: 9   Occupational History  . Retired     Tourist information centre manager   Social History Main Topics  . Smoking status: Former Smoker    Packs/day: 0.25    Years: 25.00    Types: Cigarettes  . Smokeless tobacco: Former Systems developer     Comment: quit date 2016  . Alcohol  use 3.6 oz/week    6 Cans of beer per week     Comment: occasional beer   . Drug use: Yes    Types: Marijuana     Comment: 3 x a month uses marijuana  . Sexual activity: Yes   Other Topics Concern  . Not on file   Social History Narrative   Regular exercise-no   Caffeine Use-yes    FAMILY HISTORY: Family History  Problem Relation Age of Onset  . Heart disease Sister   . Stroke Sister   . Colon cancer Paternal Uncle   . Diabetes Neg Hx     ALLERGIES:  is allergic to heparin and poison Eric extract [poison Eric extract].  MEDICATIONS:  Current Outpatient Prescriptions  Medication Sig Dispense Refill  . chlorhexidine (PERIDEX) 0.12 % solution Use as directed 15 mLs in the mouth or throat 2 (two) times daily. Swish and spit. Do not swallow. 473 mL 0  . dexamethasone (DECADRON) 4 MG tablet Take 2 tablets (8 mg total) by mouth 2 (two) times daily. Start the day before Taxotere. Then daily after chemo for 2 days. 30 tablet 1  . dronabinol (MARINOL) 10 MG capsule Take 1 capsule (10 mg total) by mouth 2 (two) times daily before lunch and supper. 60 capsule 0  . ondansetron (ZOFRAN) 8 MG tablet Take 1 tablet (8 mg total) by mouth 2 (two) times daily as needed for refractory nausea / vomiting. 30 tablet 1  . oxyCODONE-acetaminophen (PERCOCET) 10-325 MG tablet Take 1 tablet by mouth every 4 (four) hours as needed for pain. 60 tablet 0  . prochlorperazine (COMPAZINE) 10 MG tablet Take 1 tablet (10 mg total) by mouth every 6 (six) hours as needed (Nausea or vomiting). 30 tablet 1  . Vitamin D, Ergocalciferol, (DRISDOL) 50000 units CAPS capsule Take 1 capsule (50,000 Units total) by mouth 2 (two) times a week. 24 capsule 0   No current facility-administered medications for this visit.     REVIEW OF SYSTEMS:    10 Point review of Systems was done is negative except as noted above.  PHYSICAL EXAMINATION: ECOG PERFORMANCE STATUS: 2 - Symptomatic, <50% confined to bed  . Vitals:    11/14/16 1155  BP: 121/74  Pulse: 84  Resp: 17  Temp: 98.5 F (36.9 C)  SpO2: 100%   Filed Weights   11/14/16 1155  Weight: 196 lb 6.4 oz (89.1 kg)   .Body mass index is 25.91 kg/m.   . Wt Readings from Last 3 Encounters:  11/14/16 196 lb 6.4 oz (89.1 kg)  08/31/16 177 lb 6.4 oz (80.5 kg)  08/11/16 175 lb 9.6 oz (79.7 kg)   GENERAL:alert, in no acute distress and comfortable SKIN: skin color, texture, turgor are normal, no rashes or significant lesions EYES: normal, conjunctiva are pink and non-injected, sclera clear OROPHARYNX:no exudate, no erythema and lips, buccal mucosa, and tongue normal  NECK: supple, no JVD, thyroid normal size, non-tender, without nodularity LYMPH:  no palpable lymphadenopathy in the cervical, axillary or inguinal  LUNGS: clear to auscultation with normal respiratory effort HEART: regular rate & rhythm,  no murmurs and no lower extremity edema ABDOMEN: abdomen soft, non-tender, normoactive bowel sounds  Musculoskeletal: No palpable areas of tenderness over the spine  PSYCH: alert & oriented x 3 with fluent speech NEURO: no focal motor/sensory deficits  LABORATORY DATA:  I have reviewed the data as listed  . CBC Latest Ref Rng & Units 11/14/2016 10/04/2016 08/31/2016  WBC 4.0 - 10.3 10e3/uL 8.0 7.6 10.0  Hemoglobin 13.0 - 17.1 g/dL 11.3(L) 11.3(L) 10.8(L)  Hematocrit 38.4 - 49.9 % 37.0(L) 37.7(L) 33.9(L)  Platelets 140 - 400 10e3/uL 211 231 328    . CMP Latest Ref Rng & Units 11/14/2016 10/04/2016 08/31/2016  Glucose 70 - 140 mg/dl 89 87 119  BUN 7.0 - 26.0 mg/dL 10.3 17.6 15.0  Creatinine 0.7 - 1.3 mg/dL 0.7 0.7 0.6(L)  Sodium 136 - 145 mEq/L 142 140 141  Potassium 3.5 - 5.1 mEq/L 4.1 4.4 4.1  Chloride 101 - 111 mmol/L - - -  CO2 22 - 29 mEq/L 27 24 22   Calcium 8.4 - 10.4 mg/dL 8.9 7.9(L) 7.7(L)  Total Protein 6.4 - 8.3 g/dL 6.3(L) 6.9 6.8  Total Bilirubin 0.20 - 1.20 mg/dL 0.35 0.34 0.26  Alkaline Phos 40 - 150 U/L 374(H) 404(H) 347(H)  AST  5 - 34 U/L 45(H) 54(H) 26  ALT 0 - 55 U/L 23 45 11            RADIOGRAPHIC STUDIES: I have personally reviewed the radiological images as listed and agreed with the findings in the report. No results found.  ASSESSMENT & PLAN:   71 yo AAM with previous h/o of locally advanced pT3b,pN0 prostate cancer now with concern for newly noted metastatic prostate cancer.  1) Metastatic Prostate Cancer  PSA level 550 pre-treatment and have improved significantly and are down to 2 Patient is noted to have extensive osseous metastatic disease throughout the spine and bilaterally in the calvarium as well. Also noted to have local recurrence in the prostatic bed, retroperitoneal Lnadenoapathy and some mediastinal LNadenoapathy. No evidence of cord compression or focal neurological symptoms. This would qualify as high volume disease. ECOG PS 1 Previously locally advanced pT3b,pN0 diagnosed in 10/2012 s/p radical prostatectomy and ADT x 1 yr. Testosterone level 7 -- is dropping appropriately with treatment. PSA has dropped to 7.1 which suggests good response. S/p 5 cycles of taxotere.  Patient is doing well and his PSA well continues to drop and is down to 0.6.  No clinical symptoms suggestive of prostate cancer progression at this time.   2) Antierior mandibular pain  Orthopantogram done - showed IMPRESSION: Residual teeth 23-26 with decay.  No mandibular osseous lesion seen  PLAN --Lupron shot today -please schedule Lupron shot every 3 months x 2 years -Hold Xgeva until next visit in 3 months -Urgent dental referral to Dr Enrique Sack for dental pain and X-ray showing teeth decay  -RTC With Dr. Irene Limbo in 3 months with labs with next Lupron shot   2) Elevated alkaline phosphatase level due to extensive bone mets. Improving with treatment. 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 Stable. -no indication for additional PRBC transfusion.  4)  Hematuria due to local recurrence of prostate cancer - no gross hematuria reported by patient now.  5) Anorexia with weight loss of about 15-20lbs. Notes improved po intake with Marinol. Patient notes he's been eating much better. . Wt Readings from  Last 3 Encounters:  11/14/16 196 lb 6.4 oz (89.1 kg)  08/31/16 177 lb 6.4 oz (80.5 kg)  08/11/16 175 lb 9.6 oz (79.7 kg)     6) Vit D deficiency with hypocalcemia -- levels have now normalized  -Being aggressively replaced since his calcium evels have dropped after Xgeva likely reflecting significant bone calcium deficits from extensive healing bone lesions. -Continue ergocalciferol 50,000U  weekly  7) Neoplasm related pain - much improved patient is has not needed much of his prescribed pain medications Plan -Continue Percocet when necessary for neoplasm related bone pains.refill given today. -Senna S for bowel prophylaxis  8) Colonic thickening in transverse colon thought to be related to spasm/incomplete distension of colon in this area. -would recommend pursuing colonoscopy with PCP  -Lupron shot today -please schedule Lupron shot every 3 months x 2 years -Hold Xgeva until next visit in 3 months -Urgent dental referral to Dr Enrique Sack for dental pain and X-ray showing teeth decay  -RTC With Dr. Irene Limbo in 3 months with labs with next Lupron shot  All of the patients questions were answered with apparent satisfaction. The patient knows to call the clinic with any problems, questions or concerns.  I spent 20 minutes counseling the patient face to face. The total time spent in the appointment was 25 minutes and more than 50% was on counseling and direct patient cares.    Sullivan Lone MD [place] AAHIVMS [facility] Hematology/Oncology Physician [facility]  (Office):       [phone] (Work cell):  [phone] (Fax):           [phone]

## 2017-01-02 ENCOUNTER — Telehealth (HOSPITAL_COMMUNITY): Payer: Self-pay | Admitting: Dentistry

## 2017-01-02 NOTE — Telephone Encounter (Signed)
01/02/17 Called and left msg.on H/M #  for pt. to call Dental Medicine for Dental Consult.  LRI

## 2017-02-12 ENCOUNTER — Telehealth: Payer: Self-pay

## 2017-02-12 NOTE — Telephone Encounter (Signed)
Pt called asking what time appt is on Wednesday. Called back and lvm it was at 1130.

## 2017-02-14 ENCOUNTER — Other Ambulatory Visit (HOSPITAL_BASED_OUTPATIENT_CLINIC_OR_DEPARTMENT_OTHER): Payer: Medicare Other

## 2017-02-14 ENCOUNTER — Encounter: Payer: Self-pay | Admitting: Hematology

## 2017-02-14 ENCOUNTER — Ambulatory Visit (HOSPITAL_BASED_OUTPATIENT_CLINIC_OR_DEPARTMENT_OTHER): Payer: Medicare Other

## 2017-02-14 ENCOUNTER — Telehealth: Payer: Self-pay | Admitting: Hematology

## 2017-02-14 ENCOUNTER — Ambulatory Visit (HOSPITAL_BASED_OUTPATIENT_CLINIC_OR_DEPARTMENT_OTHER): Payer: Medicare Other | Admitting: Hematology

## 2017-02-14 VITALS — BP 131/73 | HR 74 | Temp 98.6°F | Resp 18 | Ht 73.0 in | Wt 210.6 lb

## 2017-02-14 DIAGNOSIS — M25569 Pain in unspecified knee: Secondary | ICD-10-CM | POA: Diagnosis not present

## 2017-02-14 DIAGNOSIS — C61 Malignant neoplasm of prostate: Secondary | ICD-10-CM

## 2017-02-14 DIAGNOSIS — M549 Dorsalgia, unspecified: Secondary | ICD-10-CM

## 2017-02-14 DIAGNOSIS — Z5111 Encounter for antineoplastic chemotherapy: Secondary | ICD-10-CM | POA: Diagnosis present

## 2017-02-14 DIAGNOSIS — R635 Abnormal weight gain: Secondary | ICD-10-CM | POA: Diagnosis not present

## 2017-02-14 DIAGNOSIS — R6884 Jaw pain: Secondary | ICD-10-CM

## 2017-02-14 DIAGNOSIS — C7951 Secondary malignant neoplasm of bone: Secondary | ICD-10-CM | POA: Diagnosis not present

## 2017-02-14 DIAGNOSIS — R232 Flushing: Secondary | ICD-10-CM

## 2017-02-14 DIAGNOSIS — E559 Vitamin D deficiency, unspecified: Secondary | ICD-10-CM

## 2017-02-14 DIAGNOSIS — K029 Dental caries, unspecified: Secondary | ICD-10-CM

## 2017-02-14 DIAGNOSIS — Z7189 Other specified counseling: Secondary | ICD-10-CM

## 2017-02-14 LAB — CBC & DIFF AND RETIC
BASO%: 0.2 % (ref 0.0–2.0)
Basophils Absolute: 0 10*3/uL (ref 0.0–0.1)
EOS ABS: 0.1 10*3/uL (ref 0.0–0.5)
EOS%: 1.2 % (ref 0.0–7.0)
HCT: 38.8 % (ref 38.4–49.9)
HEMOGLOBIN: 12.1 g/dL — AB (ref 13.0–17.1)
IMMATURE RETIC FRACT: 7 % (ref 3.00–10.60)
LYMPH#: 6.1 10*3/uL — AB (ref 0.9–3.3)
LYMPH%: 75.1 % — ABNORMAL HIGH (ref 14.0–49.0)
MCH: 27.9 pg (ref 27.2–33.4)
MCHC: 31.2 g/dL — ABNORMAL LOW (ref 32.0–36.0)
MCV: 89.4 fL (ref 79.3–98.0)
MONO#: 0.5 10*3/uL (ref 0.1–0.9)
MONO%: 5.9 % (ref 0.0–14.0)
NEUT%: 17.6 % — ABNORMAL LOW (ref 39.0–75.0)
NEUTROS ABS: 1.4 10*3/uL — AB (ref 1.5–6.5)
Platelets: 227 10*3/uL (ref 140–400)
RBC: 4.34 10*6/uL (ref 4.20–5.82)
RDW: 16.1 % — AB (ref 11.0–14.6)
RETIC %: 1.35 % (ref 0.80–1.80)
RETIC CT ABS: 58.59 10*3/uL (ref 34.80–93.90)
WBC: 8.1 10*3/uL (ref 4.0–10.3)

## 2017-02-14 LAB — COMPREHENSIVE METABOLIC PANEL
ALT: 20 U/L (ref 0–55)
ANION GAP: 8 meq/L (ref 3–11)
AST: 40 U/L — AB (ref 5–34)
Albumin: 3.9 g/dL (ref 3.5–5.0)
Alkaline Phosphatase: 273 U/L — ABNORMAL HIGH (ref 40–150)
BUN: 16.6 mg/dL (ref 7.0–26.0)
CALCIUM: 9.6 mg/dL (ref 8.4–10.4)
CHLORIDE: 105 meq/L (ref 98–109)
CO2: 27 mEq/L (ref 22–29)
Creatinine: 0.7 mg/dL (ref 0.7–1.3)
EGFR: >60 mL/min/{1.73_m2} (ref >60)
Glucose: 70 mg/dl (ref 70–140)
POTASSIUM: 3.9 meq/L (ref 3.5–5.1)
Sodium: 140 mEq/L (ref 136–145)
TOTAL PROTEIN: 7.1 g/dL (ref 6.4–8.3)
Total Bilirubin: 0.4 mg/dL (ref 0.20–1.20)

## 2017-02-14 MED ORDER — OXYCODONE-ACETAMINOPHEN 10-325 MG PO TABS: 1.0000 | ORAL_TABLET | ORAL | 0 refills | Status: DC | PRN

## 2017-02-14 MED ORDER — LEUPROLIDE ACETATE (3 MONTH) 22.5 MG IM KIT
22.5000 mg | PACK | Freq: Once | INTRAMUSCULAR | Status: AC
  Administered 2017-02-14: 22.5 mg via INTRAMUSCULAR
  Filled 2017-02-14: qty 22.5

## 2017-02-14 MED ORDER — DENOSUMAB 120 MG/1.7ML ~~LOC~~ SOLN
120.0000 mg | Freq: Once | SUBCUTANEOUS | Status: DC
  Filled 2017-02-14: qty 1.7

## 2017-02-14 NOTE — Progress Notes (Signed)
HEMATOLOGY/ONCOLOGY CLINIC NOTE  Date of Service: 02/14/17   Patient Care Team: [name], NP as PCP - General (Internal Medicine)  CHIEF COMPLAINTS/PURPOSE OF CONSULTATION:  f/u Multiple spinal bone mets concerning for metastatic prostate cancer  HISTORY OF PRESENTING ILLNESS:  Eric Osborn. is a wonderful 71 y.o. male who has been referred to [place] from the [facility] long hospital ED by [name] PA-C for evaluation and management of metastatic malignancy concerning for metastatic prostate cancer.  Patient has had a h/o locally advanced prostate cancer diagnosed in 2014 and had a radical prostatectomy and pelvic LN dissection by Dr [name] in 10/2012. Pathology showed pT3b pN0 disease (with positive margins, extraprostatic extension, seminal vesicle involvement and angiolymphatic invasion). Bone scan was negative for metastatic disease. Patient report no post-op RT. He notes that he received lupron shots as per his urologist for 1 year post-operatively. He was being monitored by Dr [name] and last had clinical evaluation and PSA about 57months ago - PSA level not available in our system. He notes that he was lost to f/u since then.  Patient presented to the ED with worsening lower and middle back pain for 2 weeks. Also notes about 15-20lbs weight loss and anorexia over the last 6-8weeks. He also notes hematuria. No fevers/chills. Patient had a CT abd/pelvis in the ED which showed Innumerable sclerotic lesions in all imaged bones consistent metastatic disease. The prostate gland has an irregular appearance and the patient may have prostate carcinoma. A few enlarged retroperitoneal lymph nodes are identified worrisome for additional foci of metastatic disease.  Patient's pain was somewhat controlled with prn percocet in ED and he was discharged home with f/u with [place]. Patient notes that the patient wakes him up in the night.  No urinary retention. No loss of bowel or bladder  control. No new neurological symptoms in his extremities.   INTERVAL HISTORY  Patient is here to follow up of his metastatic prostatic cancer and next Lupron shot. He is doing well overall. He is understandably happy to have gained weight. He has gained 14 pounds since August. He is trying to remain as active as possible. He reports he hasn't seen the dentist yet despite being referred 2 times. Was given another referral since the lack of dental evaluation as delayed the option to continue his Xgeva therapy.  On ROS, pt reports weight gain, pain in the knee, back pain, hot flashes and denies changes in BM, dysuria, loss of appetite and any other accompanying symptoms.     MEDICAL HISTORY:  Past Medical History:  Diagnosis Date  . Arthritis    left knee, left shoulder  . Erectile dysfunction 06/25/2012  . Foley catheter in place   . Goiter 06/25/2012  . H/O hiatal hernia   . Hematuria    occasional  . Hemorrhoid   . History of bladder infections   . Joint pain     SURGICAL HISTORY: Past Surgical History:  Procedure Laterality Date  . INGUINAL HERNIA REPAIR  8 yrs ago  . LYMPHADENECTOMY Bilateral 11/20/2012   Procedure: WITH BILATERAL PELVIC LYMPH NODE DISSECTION ;  Surgeon: [name], MD;  Location: [facility];  Service: Urology;  Laterality: Bilateral;  . ROBOT ASSISTED LAPAROSCOPIC RADICAL PROSTATECTOMY N/A 11/20/2012   Procedure: ROBOTIC ASSISTED LAPAROSCOPIC RADICAL PROSTATECTOMY;  Surgeon: [name], MD;  Location: [facility];  Service: Urology;  Laterality: N/A;    SOCIAL HISTORY: Social History   Socioeconomic History  .  Marital status: Divorced    Spouse name: Not on file  . Number of children: Not on file  . Years of education: 13  . Highest education level: Not on file  Social Needs  . Financial resource strain: Not on file  . Food insecurity - worry: Not on file  . Food insecurity - inability: Not on file  . Transportation needs - medical: Not on file  .  Transportation needs - non-medical: Not on file  Occupational History  . Occupation: Retired    Comment: textile  Tobacco Use  . Smoking status: Former Smoker    Packs/day: 0.25    Years: 25.00    Pack years: 6.25    Types: Cigarettes  . Smokeless tobacco: Former Systems developer  . Tobacco comment: quit date 2016  Substance and Sexual Activity  . Alcohol use: Yes    Alcohol/week: 3.6 oz    Types: 6 Cans of beer per week    Comment: occasional beer   . Drug use: Yes    Types: Marijuana    Comment: 3 x a month uses marijuana  . Sexual activity: Yes  Other Topics Concern  . Not on file  Social History Narrative   Regular exercise-no   Caffeine Use-yes    FAMILY HISTORY: Family History  Problem Relation Age of Onset  . Heart disease Sister   . Stroke Sister   . Colon cancer Paternal Uncle   . Diabetes Neg Hx     ALLERGIES:  is allergic to heparin and poison ivy extract [poison ivy extract].  MEDICATIONS:  Current Outpatient Medications  Medication Sig Dispense Refill  . chlorhexidine (PERIDEX) 0.12 % solution Use as directed 15 mLs in the mouth or throat 2 (two) times daily. Swish and spit. Do not swallow. 473 mL 0  . dexamethasone (DECADRON) 4 MG tablet Take 2 tablets (8 mg total) by mouth 2 (two) times daily. Start the day before Taxotere. Then daily after chemo for 2 days. 30 tablet 1  . dronabinol (MARINOL) 10 MG capsule Take 1 capsule (10 mg total) by mouth 2 (two) times daily before lunch and supper. 60 capsule 0  . ondansetron (ZOFRAN) 8 MG tablet Take 1 tablet (8 mg total) by mouth 2 (two) times daily as needed for refractory nausea / vomiting. 30 tablet 1  . oxyCODONE-acetaminophen (PERCOCET) 10-325 MG tablet Take 1 tablet by mouth every 4 (four) hours as needed for pain. 60 tablet 0  . prochlorperazine (COMPAZINE) 10 MG tablet Take 1 tablet (10 mg total) by mouth every 6 (six) hours as needed (Nausea or vomiting). 30 tablet 1  . Vitamin D, Ergocalciferol, (DRISDOL) 50000  units CAPS capsule Take 1 capsule (50,000 Units total) by mouth 2 (two) times a week. 24 capsule 0   No current facility-administered medications for this visit.     REVIEW OF SYSTEMS:    10 Point review of Systems was done is negative except as noted above.  PHYSICAL EXAMINATION: ECOG PERFORMANCE STATUS: 2 - Symptomatic, <50% confined to bed  . Vitals:   02/14/17 1206  BP: 131/73  Pulse: 74  Resp: 18  Temp: 98.6 F (37 C)  SpO2: 100%   Filed Weights   02/14/17 1206  Weight: 210 lb 9.6 oz (95.5 kg)   .Body mass index is 27.79 kg/m.   . Wt Readings from Last 3 Encounters:  11/14/16 196 lb 6.4 oz (89.1 kg)  08/31/16 177 lb 6.4 oz (80.5 kg)  08/11/16 175 lb 9.6 oz (  79.7 kg)   GENERAL:alert, in no acute distress and comfortable SKIN: skin color, texture, turgor are normal, no rashes or significant lesions EYES: normal, conjunctiva are pink and non-injected, sclera clear OROPHARYNX:no exudate, no erythema and lips, buccal mucosa, and tongue normal , upper dentures, few residual anterior teeth in lower jaw NECK: supple, no JVD, thyroid normal size, non-tender, without nodularity LYMPH:  no palpable lymphadenopathy in the cervical, axillary or inguinal LUNGS: clear to auscultation with normal respiratory effort HEART: regular rate & rhythm,  no murmurs and no lower extremity edema ABDOMEN: abdomen soft, non-tender, normoactive bowel sounds  Musculoskeletal: No palpable areas of tenderness over the spine  PSYCH: alert & oriented x 3 with fluent speech NEURO: no focal motor/sensory deficits  LABORATORY DATA:  I have reviewed the data as listed  . CBC Latest Ref Rng & Units 02/14/2017 11/14/2016 10/04/2016  WBC 4.0 - 10.3 10e3/uL 8.1 8.0 7.6  Hemoglobin 13.0 - 17.1 g/dL 12.1(L) 11.3(L) 11.3(L)  Hematocrit 38.4 - 49.9 % 38.8 37.0(L) 37.7(L)  Platelets 140 - 400 10e3/uL 227 211 231    . CMP Latest Ref Rng & Units 02/14/2017 11/14/2016 10/04/2016  Glucose 70 - 140 mg/dl 70  89 87  BUN 7.0 - 26.0 mg/dL 16.6 10.3 17.6  Creatinine 0.7 - 1.3 mg/dL 0.7 0.7 0.7  Sodium 136 - 145 mEq/L 140 142 140  Potassium 3.5 - 5.1 mEq/L 3.9 4.1 4.4  Chloride 101 - 111 mmol/L - - -  CO2 22 - 29 mEq/L 27 27 24   Calcium 8.4 - 10.4 mg/dL 9.6 8.9 7.9(L)  Total Protein 6.4 - 8.3 g/dL 7.1 6.3(L) 6.9  Total Bilirubin 0.20 - 1.20 mg/dL 0.40 0.35 0.34  Alkaline Phos 40 - 150 U/L 273(H) 374(H) 404(H)  AST 5 - 34 U/L 40(H) 45(H) 54(H)  ALT 0 - 55 U/L 20 23 45             RADIOGRAPHIC STUDIES: I have personally reviewed the radiological images as listed and agreed with the findings in the report. No results found.  ASSESSMENT & PLAN:   71 yo AAM with previous h/o of locally advanced pT3b,pN0 prostate cancer now with concern for newly noted metastatic prostate cancer.  1) Metastatic Prostate Cancer  PSA level 550 pre-treatment and have improved significantly and are down to 1.1 Patient had been noted to have extensive osseous metastatic disease throughout the spine and bilaterally in the calvarium as well. Also noted to have local recurrence in the prostatic bed, retroperitoneal Lnadenoapathy and some mediastinal LNadenoapathy.  ECOG PS 1 Previously locally advanced pT3b,pN0 diagnosed in 10/2012 s/p radical prostatectomy and ADT x 1 yr. Testosterone level 7 -- is dropping appropriately with treatment. PSA has dropped to 1.1 (slightly up from 0.6) -will need to be monitored. S/p 6 cycles of taxotere.  Patient is doing well and his PSA well continues to drop and is down to 0.6.  No clinical symptoms suggestive of prostate cancer progression at this time.   2) Antierior mandibular pain  Orthopantogram done - showed IMPRESSION: Residual teeth 23-26 with decay.  No mandibular osseous lesion seen  PLAN --Lupron shot today -please schedule Lupron shot every 3 months x 2 years -Hold Xgeva until next visit in 3 months -Urgent dental referral again to Dr Enrique Sack for dental  pain and X-ray showing teeth decay - this would need to be addressed prior to resuming Delton See  -RTC With Dr. Irene Limbo in 3 months with labs with next Lupron shot  2) Elevated alkaline phosphatase level due to extensive bone mets. Improving with treatment. 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 Stable. Resolved -would anticipate mild anemia from ADT  4) Hematuria due to local recurrence of prostate cancer - no gross hematuria reported by patient now.  5) Anorexia with weight loss of about 15-20lbs. Notes improved po intake with Marinol. Patient notes he's been eating much better. . Wt Readings from Last 3 Encounters:  11/14/16 196 lb 6.4 oz (89.1 kg)  08/31/16 177 lb 6.4 oz (80.5 kg)  08/11/16 175 lb 9.6 oz (79.7 kg)     6) Vit D deficiency with hypocalcemia -- levels have now normalized  -Being aggressively replaced since his calcium evels have dropped after Xgeva likely reflecting significant bone calcium deficits from extensive healing bone lesions. -Continue ergocalciferol 50,000U  weekly  7) Neoplasm related pain - much improved patient is has not needed much of his prescribed pain medications Plan -Continue Percocet when necessary for neoplasm related bone pains.refill given today. -Senna S for bowel prophylaxis  8) Colonic thickening in transverse colon thought to be related to spasm/incomplete distension of colon in this area. -would recommend pursuing colonoscopy with PCP --pending  -Lupron shot today -please schedule Lupron shot every 3 months x 2 years -Will fu on PSA labs -Hold Xgeva until dental clearance -Gave urgent dental referral to Dr Enrique Sack for dental pain and X-ray showing teeth decay   -RTC With Dr. Irene Limbo in 3 months with labs with next Lupron shot  All of the patients questions were answered with apparent satisfaction. The patient knows to call the clinic with any problems, questions or concerns.  I  spent 20 minutes counseling the patient face to face. The total time spent in the appointment was 25 minutes and more than 50% was on counseling and direct patient cares.    Sullivan Lone MD [place] AAHIVMS [facility] Hematology/Oncology Physician [place]  (Office):       [phone] (Work cell):  [phone] (Fax):           [phone]  This document serves as a record of services personally performed by Sullivan Lone, MD. It was created on his behalf by Alean Rinne, a trained medical scribe. The creation of this record is based on the scribe's personal observations and the provider's statements to them.   .I have reviewed the above documentation for accuracy and completeness, and I agree with the above. [name] MD MS

## 2017-02-14 NOTE — Telephone Encounter (Signed)
Scheduled appt per 11/21 los - Gave patient AVS and calender per los.  

## 2017-02-14 NOTE — Patient Instructions (Signed)
Denosumab injection What is this medicine? DENOSUMAB (den oh sue mab) slows bone breakdown. Prolia is used to treat osteoporosis in women after menopause and in men. Delton See is used to treat a high calcium level due to cancer and to prevent bone fractures and other bone problems caused by multiple myeloma or cancer bone metastases. Delton See is also used to treat giant cell tumor of the bone. This medicine may be used for other purposes; ask your health care provider or pharmacist if you have questions. COMMON BRAND NAME(S): Prolia, XGEVA What should I tell my health care provider before I take this medicine? They need to know if you have any of these conditions: -dental disease -having surgery or tooth extraction -infection -kidney disease -low levels of calcium or Vitamin D in the blood -malnutrition -on hemodialysis -skin conditions or sensitivity -thyroid or parathyroid disease -an unusual reaction to denosumab, other medicines, foods, dyes, or preservatives -pregnant or trying to get pregnant -breast-feeding How should I use this medicine? This medicine is for injection under the skin. It is given by a health care professional in a hospital or clinic setting. If you are getting Prolia, a special MedGuide will be given to you by the pharmacist with each prescription and refill. Be sure to read this information carefully each time. For Prolia, talk to your pediatrician regarding the use of this medicine in children. Special care may be needed. For Delton See, talk to your pediatrician regarding the use of this medicine in children. While this drug may be prescribed for children as young as 13 years for selected conditions, precautions do apply. Overdosage: If you think you have taken too much of this medicine contact a poison control center or emergency room at once. NOTE: This medicine is only for you. Do not share this medicine with others. What if I miss a dose? It is important not to miss your  dose. Call your doctor or health care professional if you are unable to keep an appointment. What may interact with this medicine? Do not take this medicine with any of the following medications: -other medicines containing denosumab This medicine may also interact with the following medications: -medicines that lower your chance of fighting infection -steroid medicines like prednisone or cortisone This list may not describe all possible interactions. Give your health care provider a list of all the medicines, herbs, non-prescription drugs, or dietary supplements you use. Also tell them if you smoke, drink alcohol, or use illegal drugs. Some items may interact with your medicine. What should I watch for while using this medicine? Visit your doctor or health care professional for regular checks on your progress. Your doctor or health care professional may order blood tests and other tests to see how you are doing. Call your doctor or health care professional for advice if you get a fever, chills or sore throat, or other symptoms of a cold or flu. Do not treat yourself. This drug may decrease your body's ability to fight infection. Try to avoid being around people who are sick. You should make sure you get enough calcium and vitamin D while you are taking this medicine, unless your doctor tells you not to. Discuss the foods you eat and the vitamins you take with your health care professional. See your dentist regularly. Brush and floss your teeth as directed. Before you have any dental work done, tell your dentist you are receiving this medicine. Do not become pregnant while taking this medicine or for 5 months after stopping  it. Talk with your doctor or health care professional about your birth control options while taking this medicine. Women should inform their doctor if they wish to become pregnant or think they might be pregnant. There is a potential for serious side effects to an unborn child. Talk  to your health care professional or pharmacist for more information. What side effects may I notice from receiving this medicine? Side effects that you should report to your doctor or health care professional as soon as possible: -allergic reactions like skin rash, itching or hives, swelling of the face, lips, or tongue -bone pain -breathing problems -dizziness -jaw pain, especially after dental work -redness, blistering, peeling of the skin -signs and symptoms of infection like fever or chills; cough; sore throat; pain or trouble passing urine -signs of low calcium like fast heartbeat, muscle cramps or muscle pain; pain, tingling, numbness in the hands or feet; seizures -unusual bleeding or bruising -unusually weak or tired Side effects that usually do not require medical attention (report to your doctor or health care professional if they continue or are bothersome): -constipation -diarrhea -headache -joint pain -loss of appetite -muscle pain -runny nose -tiredness -upset stomach This list may not describe all possible side effects. Call your doctor for medical advice about side effects. You may report side effects to FDA at 1-800-FDA-1088. Where should I keep my medicine? This medicine is only given in a clinic, doctor's office, or other health care setting and will not be stored at home. NOTE: This sheet is a summary. It may not cover all possible information. If you have questions about this medicine, talk to your doctor, pharmacist, or health care provider.  2018 Elsevier/Gold Standard (2016-04-04 19:17:21) Leuprolide depot injection What is this medicine? LEUPROLIDE (loo PROE lide) is a man-made protein that acts like a natural hormone in the body. It decreases testosterone in men and decreases estrogen in women. In men, this medicine is used to treat advanced prostate cancer. In women, some forms of this medicine may be used to treat endometriosis, uterine fibroids, or other  male hormone-related problems. This medicine may be used for other purposes; ask your health care provider or pharmacist if you have questions. COMMON BRAND NAME(S): Eligard, Lupron Depot, Lupron Depot-Ped, Viadur What should I tell my health care provider before I take this medicine? They need to know if you have any of these conditions: -diabetes -heart disease or previous heart attack -high blood pressure -high cholesterol -mental illness -osteoporosis -pain or difficulty passing urine -seizures -spinal cord metastasis -stroke -suicidal thoughts, plans, or attempt; a previous suicide attempt by you or a family member -tobacco smoker -unusual vaginal bleeding (women) -an unusual or allergic reaction to leuprolide, benzyl alcohol, other medicines, foods, dyes, or preservatives -pregnant or trying to get pregnant -breast-feeding How should I use this medicine? This medicine is for injection into a muscle or for injection under the skin. It is given by a health care professional in a hospital or clinic setting. The specific product will determine how it will be given to you. Make sure you understand which product you receive and how often you will receive it. Talk to your pediatrician regarding the use of this medicine in children. Special care may be needed. Overdosage: If you think you have taken too much of this medicine contact a poison control center or emergency room at once. NOTE: This medicine is only for you. Do not share this medicine with others. What if I miss a dose? It is  important not to miss a dose. Call your doctor or health care professional if you are unable to keep an appointment. Depot injections: Depot injections are given either once-monthly, every 12 weeks, every 16 weeks, or every 24 weeks depending on the product you are prescribed. The product you are prescribed will be based on if you are male or male, and your condition. Make sure you understand your  product and dosing. What may interact with this medicine? Do not take this medicine with any of the following medications: -chasteberry This medicine may also interact with the following medications: -herbal or dietary supplements, like black cohosh or DHEA -male hormones, like estrogens or progestins and birth control pills, patches, rings, or injections -male hormones, like testosterone This list may not describe all possible interactions. Give your health care provider a list of all the medicines, herbs, non-prescription drugs, or dietary supplements you use. Also tell them if you smoke, drink alcohol, or use illegal drugs. Some items may interact with your medicine. What should I watch for while using this medicine? Visit your doctor or health care professional for regular checks on your progress. During the first weeks of treatment, your symptoms may get worse, but then will improve as you continue your treatment. You may get hot flashes, increased bone pain, increased difficulty passing urine, or an aggravation of nerve symptoms. Discuss these effects with your doctor or health care professional, some of them may improve with continued use of this medicine. Male patients may experience a menstrual cycle or spotting during the first months of therapy with this medicine. If this continues, contact your doctor or health care professional. What side effects may I notice from receiving this medicine? Side effects that you should report to your doctor or health care professional as soon as possible: -allergic reactions like skin rash, itching or hives, swelling of the face, lips, or tongue -breathing problems -chest pain -depression or memory disorders -pain in your legs or groin -pain at site where injected or implanted -seizures -severe headache -swelling of the feet and legs -suicidal thoughts or other mood changes -visual changes -vomiting Side effects that usually do not require  medical attention (report to your doctor or health care professional if they continue or are bothersome): -breast swelling or tenderness -decrease in sex drive or performance -diarrhea -hot flashes -loss of appetite -muscle, joint, or bone pains -nausea -redness or irritation at site where injected or implanted -skin problems or acne This list may not describe all possible side effects. Call your doctor for medical advice about side effects. You may report side effects to FDA at 1-800-FDA-1088. Where should I keep my medicine? This drug is given in a hospital or clinic and will not be stored at home. NOTE: This sheet is a summary. It may not cover all possible information. If you have questions about this medicine, talk to your doctor, pharmacist, or health care provider.  2018 Elsevier/Gold Standard (2015-08-26 09:45:53)

## 2017-02-15 LAB — PSA: PROSTATE SPECIFIC AG, SERUM: 1.1 ng/mL (ref 0.0–4.0)

## 2017-02-16 ENCOUNTER — Telehealth: Payer: Self-pay

## 2017-02-16 NOTE — Telephone Encounter (Signed)
Left VM for patient requesting that he f/u with Dr. Enrique Sack (dental) at [phone]. Able to get sister on the phone. Shared information with her in the hopes that she may be able to get in touch with patient more easily.

## 2017-04-04 ENCOUNTER — Telehealth: Payer: Self-pay | Admitting: *Deleted

## 2017-04-04 NOTE — Telephone Encounter (Signed)
Pt called inquiring about previous referral for dentistry.  Pt stated he has several "broken teeth" and they are "cutting my mouth."  Verified dental referral has been placed.  Pt stated he has not received any calls regarding dental referrals.  Message sent to Audie Clear requesting any information regarding past referrals.  Pt stated any information can be relayed to [facility] @ [phone].

## 2017-04-05 ENCOUNTER — Telehealth: Payer: Self-pay | Admitting: *Deleted

## 2017-04-05 NOTE — Telephone Encounter (Signed)
Per Audie Clear in scheduling, Dr. Ritta Slot office tried several times to contact pt regarding dental referral.  This RN contacted pt's friend Danley Danker to notify.  Dr. Ritta Slot office number provided to call and schedule apt.  Freda Munro verbalized understanding/thankful for call.

## 2017-04-16 ENCOUNTER — Encounter (HOSPITAL_COMMUNITY): Payer: Self-pay | Admitting: Dentistry

## 2017-04-16 ENCOUNTER — Ambulatory Visit (HOSPITAL_COMMUNITY): Payer: Medicare Other | Admitting: Dentistry

## 2017-04-16 VITALS — BP 135/82 | HR 70 | Temp 97.9°F

## 2017-04-16 DIAGNOSIS — Z972 Presence of dental prosthetic device (complete) (partial): Secondary | ICD-10-CM

## 2017-04-16 DIAGNOSIS — C7951 Secondary malignant neoplasm of bone: Secondary | ICD-10-CM

## 2017-04-16 DIAGNOSIS — K08409 Partial loss of teeth, unspecified cause, unspecified class: Secondary | ICD-10-CM

## 2017-04-16 DIAGNOSIS — M264 Malocclusion, unspecified: Secondary | ICD-10-CM

## 2017-04-16 DIAGNOSIS — K045 Chronic apical periodontitis: Secondary | ICD-10-CM

## 2017-04-16 DIAGNOSIS — K0820 Unspecified atrophy of edentulous alveolar ridge: Secondary | ICD-10-CM

## 2017-04-16 DIAGNOSIS — C61 Malignant neoplasm of prostate: Secondary | ICD-10-CM

## 2017-04-16 DIAGNOSIS — K029 Dental caries, unspecified: Secondary | ICD-10-CM | POA: Diagnosis not present

## 2017-04-16 DIAGNOSIS — K0401 Reversible pulpitis: Secondary | ICD-10-CM | POA: Diagnosis not present

## 2017-04-16 DIAGNOSIS — K0889 Other specified disorders of teeth and supporting structures: Secondary | ICD-10-CM

## 2017-04-16 DIAGNOSIS — K083 Retained dental root: Secondary | ICD-10-CM

## 2017-04-16 NOTE — Patient Instructions (Signed)
Patient is being referred to Dr. Diona Browner, oral surgeon, for extraction remaining lower teeth. Dr. Lupita Leash office will contact the patient to schedule this consultation and dental treatment appointment as indicated.  Dr. Enrique Sack

## 2017-04-16 NOTE — Progress Notes (Signed)
DENTAL CONSULTATION  Date of Consultation:  04/16/2017 Patient Name:   Eric Osborn. Date of Birth:   1945/04/19 Medical Record Number: [id number]  VITALS: BP 135/82   Pulse 70   Temp 97.9 F (36.6 C)   CHIEF COMPLAINT: The patient was referred by Dr. Irene Limbo for a dental consultation.  HPI: Eric Osborn. this 72 year old male with a history of metastatic prostate cancer with bony metastases. Patient has undergone chemotherapy with Dr. Irene Limbo.  Patient is currently undergoing Lupron injections every 3 months. Patient has had 3 doses of Xgeva therapy on 05/17/2016, 07/12/2016, and 08/11/2016. Patient is now seen to evaluate poor dentition and to assess the risk for osteonecrosis of the jaw related to previous Xgeva therapy and anticipated invasive dental procedures.  The patient is currently complaining of intermittent toothache symptoms coming from the remaining lower 4 teeth. Patient describes a sharp pain that reaches an intensity of 6 out of 10 but is currently 4 out of 10 in intensity today. This pain usually occurs after chewing on his teeth and last for up to an hour.  This has been occurring over the last 6-7 months. Patient recently broke off the lower left anterior tooth during Thanksgiving dinner and the lower right anterior tooth during Christmas dinner.  The patient has not seen a dentist in a long time. This is approximately 8 years ago for an exam and cleaning in [place], [place]. Patient indicates that the upper complete denture was made approximately 20 years ago and "fits fine".  Patient had 10 lower teeth removed approximately 10 years ago by Dr. Diona Browner (oral surgeon) with no complications. Patient indicates that Dr. Hoyt Koch used "laughing gas" during treatment due to his dental phobia.  PROBLEM LIST: Patient Active Problem List   Diagnosis Date Noted  . Vitamin D deficiency 08/31/2016  . Goals of care, counseling/discussion 05/20/2016  . Tachycardia 05/19/2016   . Prostate cancer metastatic to multiple sites ([place]) 05/13/2016  . Bone metastases ([place]) 05/08/2016  . Goiter 06/25/2012  . Erectile dysfunction 06/25/2012  . Arthritis 06/25/2012    PMH: Past Medical History:  Diagnosis Date  . Arthritis    left knee, left shoulder  . Erectile dysfunction 06/25/2012  . Foley catheter in place   . Goiter 06/25/2012  . H/O hiatal hernia   . Hematuria    occasional  . Hemorrhoid   . History of bladder infections   . Joint pain     PSH: Past Surgical History:  Procedure Laterality Date  . INGUINAL HERNIA REPAIR  8 yrs ago  . LYMPHADENECTOMY Bilateral 11/20/2012   Procedure: WITH BILATERAL PELVIC LYMPH NODE DISSECTION ;  Surgeon: [name], MD;  Location: [facility];  Service: Urology;  Laterality: Bilateral;  . ROBOT ASSISTED LAPAROSCOPIC RADICAL PROSTATECTOMY N/A 11/20/2012   Procedure: ROBOTIC ASSISTED LAPAROSCOPIC RADICAL PROSTATECTOMY;  Surgeon: [name], MD;  Location: [facility];  Service: Urology;  Laterality: N/A;    ALLERGIES: Allergies  Allergen Reactions  . Heparin Other (See Comments)    Religious reasons  . Poison Ivy Extract [Poison Ivy Extract] Nausea And Vomiting and Rash    MEDICATIONS: Current Outpatient Medications  Medication Sig Dispense Refill  . leuprolide (LUPRON) 22.5 MG injection Inject 22.5 mg into the muscle every 3 (three) months.    . Vitamin D, Ergocalciferol, (DRISDOL) 50000 units CAPS capsule Take 1 capsule (50,000 Units total) by mouth 2 (two) times a week. 24 capsule 0  . denosumab (XGEVA)  120 MG/1.7ML SOLN injection Inject 120 mg into the skin once.    [name] oxyCODONE-acetaminophen (PERCOCET) 10-325 MG tablet Take 1 tablet by mouth every 4 (four) hours as needed for pain. (Patient not taking: Reported on 04/16/2017) 60 tablet 0   No current facility-administered medications for this visit.      LABS: Lab Results  Component Value Date   WBC 8.1 02/14/2017   HGB 12.1 (L) 02/14/2017   HCT 38.8  02/14/2017   MCV 89.4 02/14/2017   PLT 227 02/14/2017      Component Value Date/Time   NA 140 02/14/2017 1041   K 3.9 02/14/2017 1041   CL 105 05/03/2016 1143   CO2 27 02/14/2017 1041   GLUCOSE 70 02/14/2017 1041   BUN 16.6 02/14/2017 1041   CREATININE 0.7 02/14/2017 1041   CALCIUM 9.6 02/14/2017 1041   GFRNONAA >60 05/03/2016 1143   GFRAA >60 05/03/2016 1143   No results found for: INR, PROTIME No results found for: PTT  SOCIAL HISTORY: Social History   Socioeconomic History  . Marital status: Divorced    Spouse name: Not on file  . Number of children: Not on file  . Years of education: 73  . Highest education level: Not on file  Social Needs  . Financial resource strain: Not on file  . Food insecurity - worry: Not on file  . Food insecurity - inability: Not on file  . Transportation needs - medical: Not on file  . Transportation needs - non-medical: Not on file  Occupational History  . Occupation: Retired    Comment: textile  Tobacco Use  . Smoking status: Former Smoker    Packs/day: 0.25    Years: 25.00    Pack years: 6.25    Types: Cigarettes  . Smokeless tobacco: Former Systems developer  . Tobacco comment: quit date 2016  Substance and Sexual Activity  . Alcohol use: Yes    Comment: occasional beer   . Drug use: Yes    Types: Marijuana    Comment: 3 x a month uses marijuana  . Sexual activity: Yes  Other Topics Concern  . Not on file  Social History Narrative   Regular exercise-no   Caffeine Use-yes     FAMILY HISTORY: Family History  Problem Relation Age of Onset  . Heart disease Sister   . Stroke Sister   . Colon cancer Paternal Uncle   . Diabetes Neg Hx     REVIEW OF SYSTEMS: Reviewed with the patient as per History of present illness. Psych: Patient indicates that he does have dental phobia.  DENTAL HISTORY: CHIEF COMPLAINT: The patient was referred by Dr. Irene Limbo for a dental consultation.  HPI: Eric Osborn. this 72 year old male with a  history of metastatic prostate cancer with bony metastases. Patient has undergone chemotherapy with Dr. Irene Limbo.  Patient is currently undergoing Lupron injections every 3 months. Patient has had 3 doses of Xgeva therapy on 05/17/2016, 07/12/2016, and 08/11/2016. Patient is now seen to evaluate poor dentition and to assess the risk for osteonecrosis of the jaw related to previous Xgeva therapy and anticipated invasive dental procedures.  The patient is currently complaining of intermittent toothache symptoms coming from the remaining lower 4 teeth. Patient describes a sharp pain that reaches an intensity of 6 out of 10 but is currently 4 out of 10 in intensity today. This pain usually occurs after chewing on his teeth and last for up to an hour.  This has been occurring over  the last 6-7 months. Patient recently broke off the lower left anterior tooth during Thanksgiving dinner and the lower right anterior tooth during Christmas dinner.  The patient has not seen a dentist in a long time. This is approximately 8 years ago for an exam and cleaning in [place], [place]. Patient indicates that the upper complete denture was made approximately 20 years ago and "fits fine".  Patient had 10 lower teeth removed approximately 10 years ago by Dr. Diona Browner (oral surgeon) with no complications. Patient indicates that Dr. Hoyt Koch used "laughing gas" during treatment due to his dental phobia.  DENTAL EXAMINATION: GENERAL:  The patient is a well-developed, well-nourished male in no acute distress. HEAD AND NECK:  The patient has swelling involving the left neck consistent with a goiter.  There is no palpable lymphadenopathy. The patient denies acute TMJ symptoms. INTRAORAL EXAM:  The patient has incipient xerostomia.  There is no evidence of oral abscess formation. There is atrophy of the lower edentulous alveolar ridges. DENTITION:  Patient is missing all teeth with the exception of tooth numbers 23, 24, 25, 26. Tooth  numbers 23 and 26 are present as retained root segments. PERIODONTAL:  The patient has chronic periodontitis with plaque and calculus accumulations, gingival recession, and incipient bone loss. DENTAL CARIES/SUBOPTIMAL RESTORATIONS:  Multiple dental caries are noted as per dental charting form. ENDODONTIC:  The patient has a history of acute pulpitis symptoms coming from the mandibular anterior teeth. There is periapical radiolucency at the apices of tooth numbers 23 and 26. CROWN AND BRIDGE:  There are no crown or bridge restorations. PROSTHODONTIC:  The patient has an upper complete denture with less than ideal retention and stability. This is approximately 20 years ago by patient report. There is no lower partial denture. OCCLUSION: The patient has a poor occlusal scheme secondary to multiple missing teeth and lack of replacement of all missing teeth with dental prostheses.  RADIOGRAPHIC INTERPRETATION: An orthopantogram was taken and supplemented with 2 lower periapical radiographs There are multiple missing teeth with the exception of tooth numbers 23 through 26. There is periapical radiolucency at the apex of tooth numbers 23 and 26. Dental caries are noted. There is incipient bone loss. There is atrophy of the edentulous alveolar ridges. There is pneumatization of the bilateral maxillary sinuses.   ASSESSMENTS: 1. Metastatic prostate cancer with bony metastases 2. History of IV Xgeva therapy with risk for osteonecrosis of  the jaw with invasive dental procedures 3. History of acute pulpitis 4. Chronic apical periodontitis 5. Chronic periodontitis 6. Gingival recession 7. Accretions 8. Retained root segments 9. Dental caries 10. Multiple missing teeth 11. Atrophy of the edentulous alveolar ridges 12. Ill fitting maxillary complete denture with no lower partial denture 13. 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 I discussed the risks, benefits, and complications of  various treatment options with the patient in relationship to his medical and dental conditions, previous Xgeva therapy, and risk for osteonecrosis of the jaws with anticipated invasive dental procedures. We discussed various treatment options to include no treatment, multiple extractions with alveoloplasty, periodontal therapy, dental restorations, root canal therapy, crown and bridge therapy, implant therapy, and replacement of missing teeth as indicated. We then discussed referral to an oral surgeon due to the potential risk for osteonecrosis of the jaw and history of dental phobia.The patient currently wishes to proceed with referral to his previous oral surgeon, Dr. Diona Browner, for evaluation for extraction of his remaining lower teeth with alveoloplasty with consideration  for IV sedation or nitrous oxide therapy as indicated.the patient will then need a healing period of 1-2 months before Xgeva therapy can be restarted by Dr. Irene Limbo.  2. Discussion of findings with medical team and coordination of future medical and dental care as needed.  I spent in excess of  120 minutes during the conduct of this consultation and >50% of this time involved direct face-to-face encounter for counseling and/or coordination of the patient's care.    Lenn Cal, DDS

## 2017-04-18 ENCOUNTER — Other Ambulatory Visit: Payer: Self-pay | Admitting: Hematology

## 2017-04-18 NOTE — Progress Notes (Signed)
Patient having dental procedure and additional dental extractions. Will hold Xgeva for now for atleast 2 months from last dental procedure.  Sullivan Lone MD MS

## 2017-05-01 ENCOUNTER — Telehealth (HOSPITAL_COMMUNITY): Payer: Self-pay | Admitting: Dentistry

## 2017-05-01 NOTE — Telephone Encounter (Signed)
05/01/2017  Patient:            Eric Osborn. Date of Birth:  [birth year] MRN:                [id number]   I called Dr. Lupita Leash office (oral surgeon) and talked with Aldona Bar. Dr. Lupita Leash office has made multiple attempts to contact the patient and has left left multiple messages to have the patient return call to schedule an oral surgery consultation for extraction of lower teeth. Aldona Bar will continue to contact patient to schedule oral surgery consultation appointment.  Lenn Cal, DDS

## 2017-05-16 NOTE — Progress Notes (Signed)
HEMATOLOGY/ONCOLOGY CLINIC NOTE  Date of Service: 05/17/17   Patient Care Team: [name], NP as PCP - General (Internal Medicine)  CHIEF COMPLAINTS/PURPOSE OF CONSULTATION:   F/u for continue mx of metastatic prostate cancer  HISTORY OF PRESENTING ILLNESS:  Eric Osborn. is a wonderful 72 y.o. male who has been referred to [place] from the [facility] long hospital ED by [name] PA-C for evaluation and management of metastatic malignancy concerning for metastatic prostate cancer.  Patient has had a h/o locally advanced prostate cancer diagnosed in 2014 and had a radical prostatectomy and pelvic LN dissection by Dr [name] in 10/2012. Pathology showed pT3b pN0 disease (with positive margins, extraprostatic extension, seminal vesicle involvement and angiolymphatic invasion). Bone scan was negative for metastatic disease. Patient report no post-op RT. He notes that he received lupron shots as per his urologist for 1 year post-operatively. He was being monitored by Dr [name] and last had clinical evaluation and PSA about 46months ago - PSA level not available in our system. He notes that he was lost to f/u since then.  Patient presented to the ED with worsening lower and middle back pain for 2 weeks. Also notes about 15-20lbs weight loss and anorexia over the last 6-8weeks. He also notes hematuria. No fevers/chills. Patient had a CT abd/pelvis in the ED which showed Innumerable sclerotic lesions in all imaged bones consistent metastatic disease. The prostate gland has an irregular appearance and the patient may have prostate carcinoma. A few enlarged retroperitoneal lymph nodes are identified worrisome for additional foci of metastatic disease.  Patient's pain was somewhat controlled with prn percocet in ED and he was discharged home with f/u with [place]. Patient notes that the patient wakes him up in the night.  No urinary retention. No loss of bowel or bladder control. No new  neurological symptoms in his extremities.   INTERVAL HISTORY  Eric Osborn. is here to follow up of his metastatic prostatic cancer and next Lupron shot. The patient's last visit with [place] was on 02/14/17. The pt reports that he is doing well overall. He reports that he has had a cold recently. He notes that two of his teeth have fallen out since his last visit with [place].  Since his dental procedure with his local dentist is still pending we cannot proceed with Xgeva again.   He reports that he will be having his teeth pulled soon and he will touch base with Dr. Lawana Chambers. He notes that he recently had one drop of blood come out of his urethra after he finished urinating. He notes that there was no pain associated. He notes drinking lots of water, staying active, keeping his Vitamin D  Lab results today (05/17/17) of CBC, CMP, and Reticulocytes is as follows: all values are WNL except for Hgb at 11.7, HCT at 37.2, MCHC at 31.5, Lymphs Abs at 6.6, CO2 at 30, Albumin at 3.4, AST at 39. PSA levels 3.9 -- gradually trending upwards. No new focal symptoms suggestive of prostate cancer progression at this time.  On review of systems, pt reports stable weight and energy levels, stable appetite, some hot flashes 2-3 times per day, coughing, episode of small urinary bleeding, and denies bone pains, other urinating problems, abdominal pains, and any other symptoms.   MEDICAL HISTORY:  Past Medical History:  Diagnosis Date  . Arthritis    left knee, left shoulder  . Erectile dysfunction 06/25/2012  . Foley catheter in place   .  Goiter 06/25/2012  . H/O hiatal hernia   . Hematuria    occasional  . Hemorrhoid   . History of bladder infections   . Joint pain     SURGICAL HISTORY: Past Surgical History:  Procedure Laterality Date  . INGUINAL HERNIA REPAIR  8 yrs ago  . LYMPHADENECTOMY Bilateral 11/20/2012   Procedure: WITH BILATERAL PELVIC LYMPH NODE DISSECTION ;  Surgeon: [name], MD;   Location: [facility];  Service: Urology;  Laterality: Bilateral;  . ROBOT ASSISTED LAPAROSCOPIC RADICAL PROSTATECTOMY N/A 11/20/2012   Procedure: ROBOTIC ASSISTED LAPAROSCOPIC RADICAL PROSTATECTOMY;  Surgeon: [name], MD;  Location: [facility];  Service: Urology;  Laterality: N/A;    SOCIAL HISTORY: Social History   Socioeconomic History  . Marital status: Divorced    Spouse name: Not on file  . Number of children: Not on file  . Years of education: 44  . Highest education level: Not on file  Social Needs  . Financial resource strain: Not on file  . Food insecurity - worry: Not on file  . Food insecurity - inability: Not on file  . Transportation needs - medical: Not on file  . Transportation needs - non-medical: Not on file  Occupational History  . Occupation: Retired    Comment: textile  Tobacco Use  . Smoking status: Former Smoker    Packs/day: 0.25    Years: 25.00    Pack years: 6.25    Types: Cigarettes  . Smokeless tobacco: Former Systems developer  . Tobacco comment: quit date 2016  Substance and Sexual Activity  . Alcohol use: Yes    Comment: occasional beer   . Drug use: Yes    Types: Marijuana    Comment: 3 x a month uses marijuana  . Sexual activity: Yes  Other Topics Concern  . Not on file  Social History Narrative   Regular exercise-no   Caffeine Use-yes    FAMILY HISTORY: Family History  Problem Relation Age of Onset  . Heart disease Sister   . Stroke Sister   . Colon cancer Paternal Uncle   . Diabetes Neg Hx     ALLERGIES:  is allergic to heparin and poison ivy extract [poison ivy extract].  MEDICATIONS:  Current Outpatient Medications  Medication Sig Dispense Refill  . denosumab (XGEVA) 120 MG/1.7ML SOLN injection Inject 120 mg into the skin once.    [name] leuprolide (LUPRON) 22.5 MG injection Inject 22.5 mg into the muscle every 3 (three) months.    [name] oxyCODONE-acetaminophen (PERCOCET) 10-325 MG tablet Take 1 tablet by mouth every 4 (four) hours as needed for  pain. 60 tablet 0  . Vitamin D, Ergocalciferol, (DRISDOL) 50000 units CAPS capsule Take 1 capsule (50,000 Units total) by mouth 2 (two) times a week. 24 capsule 0   No current facility-administered medications for this visit.     REVIEW OF SYSTEMS:    .10 Point review of Systems was done is negative except as noted above.   PHYSICAL EXAMINATION: ECOG PERFORMANCE STATUS: 2 - Symptomatic, <50% confined to bed  . Vitals:   05/17/17 1220  BP: 135/78  Pulse: 90  Resp: 18  Temp: 98.8 F (37.1 C)  SpO2: 99%   Filed Weights   05/17/17 1220  Weight: 216 lb 11.2 oz (98.3 kg)   .Body mass index is 28.59 kg/m.   . Wt Readings from Last 3 Encounters:  05/17/17 216 lb 11.2 oz (98.3 kg)  02/14/17 210 lb 9.6 oz (95.5 kg)  11/14/16  196 lb 6.4 oz (89.1 kg)   . GENERAL:alert, in no acute distress and comfortable SKIN: no acute rashes, no significant lesions EYES: conjunctiva are pink and non-injected, sclera anicteric OROPHARYNX: MMM, no exudates, no oropharyngeal erythema or ulceration NECK: supple, no JVD LYMPH:  no palpable lymphadenopathy in the cervical, axillary or inguinal regions LUNGS: clear to auscultation b/l with normal respiratory effort HEART: regular rate & rhythm ABDOMEN:  normoactive bowel sounds , non tender, not distended. Extremity: no pedal edema PSYCH: alert & oriented x 3 with fluent speech NEURO: no focal motor/sensory deficits    LABORATORY DATA:  I have reviewed the data as listed  . CBC Latest Ref Rng & Units 05/17/2017 02/14/2017 11/14/2016  WBC 4.0 - 10.3 K/uL 10.1 8.1 8.0  Hemoglobin 13.0 - 17.1 g/dL - 12.1(L) 11.3(L)  Hematocrit 38.4 - 49.9 % 37.2(L) 38.8 37.0(L)  Platelets 140 - 400 K/uL 259 227 211  hgb 11.7  . CMP Latest Ref Rng & Units 05/17/2017 02/14/2017 11/14/2016  Glucose 70 - 140 mg/dL 90 70 89  BUN 7 - 26 mg/dL 8 16.6 10.3  Creatinine 0.70 - 1.30 mg/dL 0.74 0.7 0.7  Sodium 136 - 145 mmol/L 139 140 142  Potassium 3.5 - 5.1 mmol/L  3.8 3.9 4.1  Chloride 98 - 109 mmol/L 101 - -  CO2 22 - 29 mmol/L 30(H) 27 27  Calcium 8.4 - 10.4 mg/dL 9.8 9.6 8.9  Total Protein 6.4 - 8.3 g/dL 7.2 7.1 6.3(L)  Total Bilirubin 0.2 - 1.2 mg/dL 0.3 0.40 0.35  Alkaline Phos 40 - 150 U/L 149 273(H) 374(H)  AST 5 - 34 U/L 39(H) 40(H) 45(H)  ALT 0 - 55 U/L 16 20 23              RADIOGRAPHIC STUDIES: I have personally reviewed the radiological images as listed and agreed with the findings in the report. No results found.  ASSESSMENT & PLAN:   72 yo AAM with previous h/o of locally advanced pT3b,pN0 prostate cancer now with concern for newly noted metastatic prostate cancer.  1) Metastatic Prostate Cancer  PSA level 550 pre-treatment and have improved significantly and are down to 0.6. Now gradually rising again and if upt o 3.9 without any other associated new symptoms. Patient had been noted to have extensive osseous metastatic disease throughout the spine and bilaterally in the calvarium as well. Also noted to have local recurrence in the prostatic bed, retroperitoneal Lnadenoapathy and some mediastinal LNadenoapathy.  ECOG PS 1 Previously locally advanced pT3b,pN0 diagnosed in 10/2012 s/p radical prostatectomy and ADT x 1 yr. Testosterone level 7 -- is dropping appropriately with treatment. S/p 6 cycles of taxotere.  2) Antierior mandibular pain  Orthopantogram done - showed IMPRESSION: Residual teeth 23-26 with decay.  No mandibular osseous lesion seen  PLAN -Lupron shot today -Discussed pt labwork today; good blood chemistries with normalized Alkaline Phosphatase. Hgb stable. Some lymphocytosis, 3 months ago WBC at 8.1k, today 10.1k- likely reactive to his current cold.  -No clinical symptoms suggestive of prostate cancer progression at this time. -PSA levels are gradually rising without any definitive symptoms of overt prostate cancer progression at this time. -continue Lupron q3 months -Would like to give Xgeva again  after the teeth extraction. (reminded patient to try to get dental problems taken care of ASAP) -if increasing PSA on next visit - will need to get rpt imaging CT+ Bone scan to evaluate for radiographically evidence progression.  2) Elevated alkaline phosphatase level due to extensive  bone mets. Improving with treatment. Down from >2k to 448-->347-->273. Today 05/17/17 at 149.  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 Stable. Resolved -would anticipate mild anemia from ADT  4) Hematuria due to local recurrence of prostate cancer - 1 episode of hematuria - now resolved  5) Anorexia with weight loss of about 15-20lbs. Notes improved po intake with Marinol. Patient notes he's been eating much better. . Wt Readings from Last 3 Encounters:  05/17/17 216 lb 11.2 oz (98.3 kg)  02/14/17 210 lb 9.6 oz (95.5 kg)  11/14/16 196 lb 6.4 oz (89.1 kg)     6) Vit D deficiency with hypocalcemia -- levels had normalized -borderline low  -Being aggressively replaced since his calcium evels have dropped after Xgeva likely reflecting significant bone calcium deficits from extensive healing bone lesions. -Continue ergocalciferol 50,000U  weekly  7) Neoplasm related pain - much improved patient is has not needed much of his prescribed pain medications Plan -Continue Percocet when necessary for neoplasm related bone pains.refill given today. -Senna S for bowel prophylaxis  8) Colonic thickening in transverse colon thought to be related to spasm/incomplete distension of colon in this area. -would recommend pursuing colonoscopy with PCP --pending   Continue lupron q3 months RTC with Dr Irene Limbo in 3 months with labs Holding Xgeva for now pending dental procedure   All of the patients questions were answered with apparent satisfaction. The patient knows to call the clinic with any problems, questions or concerns.  . The total time spent in the appointment was 25 minutes and more than 50% was  on counseling and direct patient cares.     Sullivan Lone MD [place] AAHIVMS [facility] Hematology/Oncology Physician Desloge  (Office):       [phone] (Work cell):  [phone] (Fax):           [phone]  This document serves as a record of services personally performed by Sullivan Lone, MD. It was created on his behalf by Baldwin Jamaica, a trained medical scribe. The creation of this record is based on the scribe's personal observations and the provider's statements to them.   .I have reviewed the above documentation for accuracy and completeness, and I agree with the above. [name] MD MS

## 2017-05-17 ENCOUNTER — Inpatient Hospital Stay: Payer: Medicare Other | Attending: Hematology | Admitting: Hematology

## 2017-05-17 ENCOUNTER — Encounter: Payer: Self-pay | Admitting: Hematology

## 2017-05-17 ENCOUNTER — Inpatient Hospital Stay: Payer: Medicare Other

## 2017-05-17 VITALS — BP 135/78 | HR 90 | Temp 98.8°F | Resp 18 | Ht 73.0 in | Wt 216.7 lb

## 2017-05-17 DIAGNOSIS — Z5111 Encounter for antineoplastic chemotherapy: Secondary | ICD-10-CM | POA: Insufficient documentation

## 2017-05-17 DIAGNOSIS — M898X8 Other specified disorders of bone, other site: Secondary | ICD-10-CM

## 2017-05-17 DIAGNOSIS — Z87891 Personal history of nicotine dependence: Secondary | ICD-10-CM | POA: Diagnosis not present

## 2017-05-17 DIAGNOSIS — C61 Malignant neoplasm of prostate: Secondary | ICD-10-CM

## 2017-05-17 DIAGNOSIS — E559 Vitamin D deficiency, unspecified: Secondary | ICD-10-CM | POA: Diagnosis not present

## 2017-05-17 DIAGNOSIS — C7951 Secondary malignant neoplasm of bone: Secondary | ICD-10-CM | POA: Diagnosis not present

## 2017-05-17 DIAGNOSIS — D72820 Lymphocytosis (symptomatic): Secondary | ICD-10-CM

## 2017-05-17 LAB — COMPREHENSIVE METABOLIC PANEL WITH GFR
ALT: 16 U/L (ref 0–55)
AST: 39 U/L — ABNORMAL HIGH (ref 5–34)
Albumin: 3.4 g/dL — ABNORMAL LOW (ref 3.5–5.0)
Alkaline Phosphatase: 149 U/L (ref 40–150)
Anion gap: 8 (ref 3–11)
BUN: 8 mg/dL (ref 7–26)
CO2: 30 mmol/L — ABNORMAL HIGH (ref 22–29)
Calcium: 9.8 mg/dL (ref 8.4–10.4)
Chloride: 101 mmol/L (ref 98–109)
Creatinine, Ser: 0.74 mg/dL (ref 0.70–1.30)
GFR calc Af Amer: >60 mL/min (ref >60)
GFR calc non Af Amer: >60 mL/min (ref >60)
Glucose, Bld: 90 mg/dL (ref 70–140)
Potassium: 3.8 mmol/L (ref 3.5–5.1)
Sodium: 139 mmol/L (ref 136–145)
Total Bilirubin: 0.3 mg/dL (ref 0.2–1.2)
Total Protein: 7.2 g/dL (ref 6.4–8.3)

## 2017-05-17 LAB — CBC WITH DIFFERENTIAL (CANCER CENTER ONLY)
Basophils Absolute: 0.1 K/uL (ref 0.0–0.1)
Basophils Relative: 1 %
Eosinophils Absolute: 0.3 K/uL (ref 0.0–0.5)
Eosinophils Relative: 3 %
HCT: 37.2 % — ABNORMAL LOW (ref 38.4–49.9)
Hemoglobin: 11.7 g/dL — ABNORMAL LOW (ref 13.0–17.1)
Lymphocytes Relative: 66 %
Lymphs Abs: 6.6 K/uL — ABNORMAL HIGH (ref 0.9–3.3)
MCH: 27.9 pg (ref 27.2–33.4)
MCHC: 31.5 g/dL — ABNORMAL LOW (ref 32.0–36.0)
MCV: 88.6 fL (ref 79.3–98.0)
Monocytes Absolute: 0.8 K/uL (ref 0.1–0.9)
Monocytes Relative: 7 %
Neutro Abs: 2.3 K/uL (ref 1.5–6.5)
Neutrophils Relative %: 23 %
Platelet Count: 259 K/uL (ref 140–400)
RBC: 4.2 MIL/uL (ref 4.20–5.82)
RDW: 14.4 % (ref 11.0–14.6)
WBC Count: 10.1 K/uL (ref 4.0–10.3)

## 2017-05-17 LAB — RETICULOCYTES
RBC.: 4.2 MIL/uL (ref 4.20–5.82)
Retic Count, Absolute: 37.8 K/uL (ref 34.8–93.9)
Retic Ct Pct: 0.9 % (ref 0.8–1.8)

## 2017-05-17 MED ORDER — OXYCODONE-ACETAMINOPHEN 10-325 MG PO TABS: 1.0000 | ORAL_TABLET | ORAL | 0 refills | Status: DC | PRN

## 2017-05-17 MED ORDER — LEUPROLIDE ACETATE (3 MONTH) 22.5 MG IM KIT
22.5000 mg | PACK | Freq: Once | INTRAMUSCULAR | Status: AC
  Administered 2017-05-17: 22.5 mg via INTRAMUSCULAR

## 2017-05-17 NOTE — Patient Instructions (Signed)
Denosumab injection What is this medicine? DENOSUMAB (den oh sue mab) slows bone breakdown. Prolia is used to treat osteoporosis in women after menopause and in men. Delton See is used to treat a high calcium level due to cancer and to prevent bone fractures and other bone problems caused by multiple myeloma or cancer bone metastases. Delton See is also used to treat giant cell tumor of the bone. This medicine may be used for other purposes; ask your health care provider or pharmacist if you have questions. COMMON BRAND NAME(S): Prolia, XGEVA What should I tell my health care provider before I take this medicine? They need to know if you have any of these conditions: -dental disease -having surgery or tooth extraction -infection -kidney disease -low levels of calcium or Vitamin D in the blood -malnutrition -on hemodialysis -skin conditions or sensitivity -thyroid or parathyroid disease -an unusual reaction to denosumab, other medicines, foods, dyes, or preservatives -pregnant or trying to get pregnant -breast-feeding How should I use this medicine? This medicine is for injection under the skin. It is given by a health care professional in a hospital or clinic setting. If you are getting Prolia, a special MedGuide will be given to you by the pharmacist with each prescription and refill. Be sure to read this information carefully each time. For Prolia, talk to your pediatrician regarding the use of this medicine in children. Special care may be needed. For Delton See, talk to your pediatrician regarding the use of this medicine in children. While this drug may be prescribed for children as young as 13 years for selected conditions, precautions do apply. Overdosage: If you think you have taken too much of this medicine contact a poison control center or emergency room at once. NOTE: This medicine is only for you. Do not share this medicine with others. What if I miss a dose? It is important not to miss your  dose. Call your doctor or health care professional if you are unable to keep an appointment. What may interact with this medicine? Do not take this medicine with any of the following medications: -other medicines containing denosumab This medicine may also interact with the following medications: -medicines that lower your chance of fighting infection -steroid medicines like prednisone or cortisone This list may not describe all possible interactions. Give your health care provider a list of all the medicines, herbs, non-prescription drugs, or dietary supplements you use. Also tell them if you smoke, drink alcohol, or use illegal drugs. Some items may interact with your medicine. What should I watch for while using this medicine? Visit your doctor or health care professional for regular checks on your progress. Your doctor or health care professional may order blood tests and other tests to see how you are doing. Call your doctor or health care professional for advice if you get a fever, chills or sore throat, or other symptoms of a cold or flu. Do not treat yourself. This drug may decrease your body's ability to fight infection. Try to avoid being around people who are sick. You should make sure you get enough calcium and vitamin D while you are taking this medicine, unless your doctor tells you not to. Discuss the foods you eat and the vitamins you take with your health care professional. See your dentist regularly. Brush and floss your teeth as directed. Before you have any dental work done, tell your dentist you are receiving this medicine. Do not become pregnant while taking this medicine or for 5 months after stopping  it. Talk with your doctor or health care professional about your birth control options while taking this medicine. Women should inform their doctor if they wish to become pregnant or think they might be pregnant. There is a potential for serious side effects to an unborn child. Talk  to your health care professional or pharmacist for more information. What side effects may I notice from receiving this medicine? Side effects that you should report to your doctor or health care professional as soon as possible: -allergic reactions like skin rash, itching or hives, swelling of the face, lips, or tongue -bone pain -breathing problems -dizziness -jaw pain, especially after dental work -redness, blistering, peeling of the skin -signs and symptoms of infection like fever or chills; cough; sore throat; pain or trouble passing urine -signs of low calcium like fast heartbeat, muscle cramps or muscle pain; pain, tingling, numbness in the hands or feet; seizures -unusual bleeding or bruising -unusually weak or tired Side effects that usually do not require medical attention (report to your doctor or health care professional if they continue or are bothersome): -constipation -diarrhea -headache -joint pain -loss of appetite -muscle pain -runny nose -tiredness -upset stomach This list may not describe all possible side effects. Call your doctor for medical advice about side effects. You may report side effects to FDA at 1-800-FDA-1088. Where should I keep my medicine? This medicine is only given in a clinic, doctor's office, or other health care setting and will not be stored at home. NOTE: This sheet is a summary. It may not cover all possible information. If you have questions about this medicine, talk to your doctor, pharmacist, or health care provider.  2018 Elsevier/Gold Standard (2016-04-04 19:17:21) Leuprolide depot injection What is this medicine? LEUPROLIDE (loo PROE lide) is a man-made protein that acts like a natural hormone in the body. It decreases testosterone in men and decreases estrogen in women. In men, this medicine is used to treat advanced prostate cancer. In women, some forms of this medicine may be used to treat endometriosis, uterine fibroids, or other  male hormone-related problems. This medicine may be used for other purposes; ask your health care provider or pharmacist if you have questions. COMMON BRAND NAME(S): Eligard, Lupron Depot, Lupron Depot-Ped, Viadur What should I tell my health care provider before I take this medicine? They need to know if you have any of these conditions: -diabetes -heart disease or previous heart attack -high blood pressure -high cholesterol -mental illness -osteoporosis -pain or difficulty passing urine -seizures -spinal cord metastasis -stroke -suicidal thoughts, plans, or attempt; a previous suicide attempt by you or a family member -tobacco smoker -unusual vaginal bleeding (women) -an unusual or allergic reaction to leuprolide, benzyl alcohol, other medicines, foods, dyes, or preservatives -pregnant or trying to get pregnant -breast-feeding How should I use this medicine? This medicine is for injection into a muscle or for injection under the skin. It is given by a health care professional in a hospital or clinic setting. The specific product will determine how it will be given to you. Make sure you understand which product you receive and how often you will receive it. Talk to your pediatrician regarding the use of this medicine in children. Special care may be needed. Overdosage: If you think you have taken too much of this medicine contact a poison control center or emergency room at once. NOTE: This medicine is only for you. Do not share this medicine with others. What if I miss a dose? It is  important not to miss a dose. Call your doctor or health care professional if you are unable to keep an appointment. Depot injections: Depot injections are given either once-monthly, every 12 weeks, every 16 weeks, or every 24 weeks depending on the product you are prescribed. The product you are prescribed will be based on if you are male or male, and your condition. Make sure you understand your  product and dosing. What may interact with this medicine? Do not take this medicine with any of the following medications: -chasteberry This medicine may also interact with the following medications: -herbal or dietary supplements, like black cohosh or DHEA -male hormones, like estrogens or progestins and birth control pills, patches, rings, or injections -male hormones, like testosterone This list may not describe all possible interactions. Give your health care provider a list of all the medicines, herbs, non-prescription drugs, or dietary supplements you use. Also tell them if you smoke, drink alcohol, or use illegal drugs. Some items may interact with your medicine. What should I watch for while using this medicine? Visit your doctor or health care professional for regular checks on your progress. During the first weeks of treatment, your symptoms may get worse, but then will improve as you continue your treatment. You may get hot flashes, increased bone pain, increased difficulty passing urine, or an aggravation of nerve symptoms. Discuss these effects with your doctor or health care professional, some of them may improve with continued use of this medicine. Male patients may experience a menstrual cycle or spotting during the first months of therapy with this medicine. If this continues, contact your doctor or health care professional. What side effects may I notice from receiving this medicine? Side effects that you should report to your doctor or health care professional as soon as possible: -allergic reactions like skin rash, itching or hives, swelling of the face, lips, or tongue -breathing problems -chest pain -depression or memory disorders -pain in your legs or groin -pain at site where injected or implanted -seizures -severe headache -swelling of the feet and legs -suicidal thoughts or other mood changes -visual changes -vomiting Side effects that usually do not require  medical attention (report to your doctor or health care professional if they continue or are bothersome): -breast swelling or tenderness -decrease in sex drive or performance -diarrhea -hot flashes -loss of appetite -muscle, joint, or bone pains -nausea -redness or irritation at site where injected or implanted -skin problems or acne This list may not describe all possible side effects. Call your doctor for medical advice about side effects. You may report side effects to FDA at 1-800-FDA-1088. Where should I keep my medicine? This drug is given in a hospital or clinic and will not be stored at home. NOTE: This sheet is a summary. It may not cover all possible information. If you have questions about this medicine, talk to your doctor, pharmacist, or health care provider.  2018 Elsevier/Gold Standard (2015-08-26 09:45:53)

## 2017-05-18 LAB — VITAMIN D 25 HYDROXY (VIT D DEFICIENCY, FRACTURES): Vit D, 25-Hydroxy: 22.7 ng/mL — ABNORMAL LOW (ref 30.0–100.0)

## 2017-05-18 LAB — PROSTATE-SPECIFIC AG, SERUM (LABCORP): PROSTATE SPECIFIC AG, SERUM: 3.9 ng/mL (ref 0.0–4.0)

## 2017-08-13 NOTE — Progress Notes (Signed)
HEMATOLOGY/ONCOLOGY CLINIC NOTE  Date of Service: 08/14/17   Patient Care Team: [name], NP as PCP - General (Internal Medicine)  CHIEF COMPLAINTS/PURPOSE OF CONSULTATION:   F/u for continue mx of metastatic prostate cancer  HISTORY OF PRESENTING ILLNESS:  Eric Jantz. is a wonderful 72 y.o. male who has been referred to [place] from the [facility] long hospital ED by [name] PA-C for evaluation and management of metastatic malignancy concerning for metastatic prostate cancer.  Patient has had a h/o locally advanced prostate cancer diagnosed in 2014 and had a radical prostatectomy and pelvic LN dissection by Dr [name] in 10/2012. Pathology showed pT3b pN0 disease (with positive margins, extraprostatic extension, seminal vesicle involvement and angiolymphatic invasion). Bone scan was negative for metastatic disease. Patient report no post-op RT. He notes that he received lupron shots as per his urologist for 1 year post-operatively. He was being monitored by Dr [name] and last had clinical evaluation and PSA about 76months ago - PSA level not available in our system. He notes that he was lost to f/u since then.  Patient presented to the ED with worsening lower and middle back pain for 2 weeks. Also notes about 15-20lbs weight loss and anorexia over the last 6-8weeks. He also notes hematuria. No fevers/chills. Patient had a CT abd/pelvis in the ED which showed Innumerable sclerotic lesions in all imaged bones consistent metastatic disease. The prostate gland has an irregular appearance and the patient may have prostate carcinoma. A few enlarged retroperitoneal lymph nodes are identified worrisome for additional foci of metastatic disease.  Patient's pain was somewhat controlled with prn percocet in ED and he was discharged home with f/u with [place]. Patient notes that the patient wakes him up in the night.  No urinary retention. No loss of bowel or bladder control. No new  neurological symptoms in his extremities.   INTERVAL HISTORY   Eric Methot. is here to follow up of his metastatic prostatic cancer and next Lupron shot. He presents to the clinic today noting he is doing well. He notes 2.5 months since he got his teeth extracted and had dentured placed. He notes he is well healed. He notes he still takes Vit D.  No new bone pain. No fevers/chills/night sweats. No new urinary symptoms.  On review of symptoms, pt notes aching in his back and upper right leg. This pain does not last long after he takes pain medication. This pain comes and goes, it is manageable.   MEDICAL HISTORY:   Past Medical History:  Diagnosis Date  . Arthritis    left knee, left shoulder  . Erectile dysfunction 06/25/2012  . Foley catheter in place   . Goiter 06/25/2012  . H/O hiatal hernia   . Hematuria    occasional  . Hemorrhoid   . History of bladder infections   . Joint pain     SURGICAL HISTORY: Past Surgical History:  Procedure Laterality Date  . INGUINAL HERNIA REPAIR  8 yrs ago  . LYMPHADENECTOMY Bilateral 11/20/2012   Procedure: WITH BILATERAL PELVIC LYMPH NODE DISSECTION ;  Surgeon: [name], MD;  Location: [facility];  Service: Urology;  Laterality: Bilateral;  . ROBOT ASSISTED LAPAROSCOPIC RADICAL PROSTATECTOMY N/A 11/20/2012   Procedure: ROBOTIC ASSISTED LAPAROSCOPIC RADICAL PROSTATECTOMY;  Surgeon: [name], MD;  Location: [facility];  Service: Urology;  Laterality: N/A;    SOCIAL HISTORY: Social History   Socioeconomic History  . Marital status: Divorced  Spouse name: Not on file  . Number of children: Not on file  . Years of education: 65  . Highest education level: Not on file  Occupational History  . Occupation: Retired    Comment: Education officer, community  . Financial resource strain: Not on file  . Food insecurity:    Worry: Not on file    Inability: Not on file  . Transportation needs:    Medical: Not on file    Non-medical: Not on  file  Tobacco Use  . Smoking status: Former Smoker    Packs/day: 0.25    Years: 25.00    Pack years: 6.25    Types: Cigarettes  . Smokeless tobacco: Former Systems developer  . Tobacco comment: quit date 2016  Substance and Sexual Activity  . Alcohol use: Yes    Comment: occasional beer   . Drug use: Yes    Types: Marijuana    Comment: 3 x a month uses marijuana  . Sexual activity: Yes  Lifestyle  . Physical activity:    Days per week: Not on file    Minutes per session: Not on file  . Stress: Not on file  Relationships  . Social connections:    Talks on phone: Not on file    Gets together: Not on file    Attends religious service: Not on file    Active member of club or organization: Not on file    Attends meetings of clubs or organizations: Not on file    Relationship status: Not on file  . Intimate partner violence:    Fear of current or ex partner: Not on file    Emotionally abused: Not on file    Physically abused: Not on file    Forced sexual activity: Not on file  Other Topics Concern  . Not on file  Social History Narrative   Regular exercise-no   Caffeine Use-yes    FAMILY HISTORY: Family History  Problem Relation Age of Onset  . Heart disease Sister   . Stroke Sister   . Colon cancer Paternal Uncle   . Diabetes Neg Hx     ALLERGIES:  is allergic to heparin and poison ivy extract [poison ivy extract].  MEDICATIONS:  Current Outpatient Medications  Medication Sig Dispense Refill  . denosumab (XGEVA) 120 MG/1.7ML SOLN injection Inject 120 mg into the skin once.    [name] leuprolide (LUPRON) 22.5 MG injection Inject 22.5 mg into the muscle every 3 (three) months.    [name] oxyCODONE-acetaminophen (PERCOCET) 10-325 MG tablet Take 1 tablet by mouth every 4 (four) hours as needed for pain. 60 tablet 0  . Vitamin D, Ergocalciferol, (DRISDOL) 50000 units CAPS capsule Take 1 capsule (50,000 Units total) by mouth 2 (two) times a week. 24 capsule 0   No current  facility-administered medications for this visit.     REVIEW OF SYSTEMS:   .10 Point review of Systems was done is negative except as noted above.  PHYSICAL EXAMINATION: ECOG PERFORMANCE STATUS: 2 - Symptomatic, <50% confined to bed  . Vitals:   08/14/17 1103  BP: 122/80  Pulse: 77  Resp: 18  Temp: 98.5 F (36.9 C)  SpO2: 99%   Filed Weights   08/14/17 1103  Weight: 209 lb 4.8 oz (94.9 kg)   .Body mass index is 27.61 kg/m. . Wt Readings from Last 3 Encounters:  08/14/17 209 lb 4.8 oz (94.9 kg)  05/17/17 216 lb 11.2 oz (98.3 kg)  02/14/17 210 lb 9.6  oz (95.5 kg)   . GENERAL:alert, in no acute distress and comfortable SKIN: no acute rashes, no significant lesions EYES: conjunctiva are pink and non-injected, sclera anicteric OROPHARYNX: MMM, no exudates, no oropharyngeal erythema or ulceration NECK: supple, no JVD LYMPH:  no palpable lymphadenopathy in the cervical, axillary or inguinal regions LUNGS: clear to auscultation b/l with normal respiratory effort HEART: regular rate & rhythm ABDOMEN:  normoactive bowel sounds , non tender, not distended. Extremity: no pedal edema PSYCH: alert & oriented x 3 with fluent speech NEURO: no focal motor/sensory deficits  LABORATORY DATA:  I have reviewed the data as listed  . CBC Latest Ref Rng & Units 08/14/2017 05/17/2017 02/14/2017  WBC 4.0 - 10.3 K/uL 9.6 10.1 8.1  Hemoglobin 13.0 - 17.1 g/dL 12.1(L) 11.7(L) 12.1(L)  Hematocrit 38.4 - 49.9 % 38.8 37.2(L) 38.8  Platelets 140 - 400 K/uL 230 259 227    CMP Latest Ref Rng & Units 05/17/2017 02/14/2017 11/14/2016  Glucose 70 - 140 mg/dL 90 70 89  BUN 7 - 26 mg/dL 8 16.6 10.3  Creatinine 0.70 - 1.30 mg/dL 0.74 0.7 0.7  Sodium 136 - 145 mmol/L 139 140 142  Potassium 3.5 - 5.1 mmol/L 3.8 3.9 4.1  Chloride 98 - 109 mmol/L 101 - -  CO2 22 - 29 mmol/L 30(H) 27 27  Calcium 8.4 - 10.4 mg/dL 9.8 9.6 8.9  Total Protein 6.4 - 8.3 g/dL 7.2 7.1 6.3(L)  Total Bilirubin 0.2 - 1.2 mg/dL  0.3 0.40 0.35  Alkaline Phos 40 - 150 U/L 149 273(H) 374(H)  AST 5 - 34 U/L 39(H) 40(H) 45(H)  ALT 0 - 55 U/L 16 20 23              RADIOGRAPHIC STUDIES: I have personally reviewed the radiological images as listed and agreed with the findings in the report. No results found.  ASSESSMENT & PLAN:   72 yo AAM with previous h/o of locally advanced pT3b,pN0 prostate cancer now with concern for newly noted metastatic prostate cancer.  1) Metastatic Prostate Cancer  PSA level 550 pre-treatment and have improved significantly and were down to 0.6. Now gradually rising again and if upt o 14.9  without any other associated new symptoms. Patient had been noted to have extensive osseous metastatic disease throughout the spine and bilaterally in the calvarium as well. Also noted to have local recurrence in the prostatic bed, retroperitoneal Lnadenoapathy and some mediastinal LNadenoapathy.  ECOG PS 1 Previously locally advanced pT3b,pN0 diagnosed in 10/2012 s/p radical prostatectomy and ADT x 1 yr. Testosterone level 7 -- is dropping appropriately with treatment. S/p 6 cycles of taxotere.  2) Antierior mandibular pain  Orthopantogram done - showed IMPRESSION: Residual teeth 23-26 with decay.  No mandibular osseous lesion seen Patient has completed dental extractions and is using dentures and   PLAN  -Lupron shot today and continue every 3 months  -Discussed pt labwork today; Hg at 12.1, some lymphocytosis, CMP, PSA and Vit D still pending.  -if further increasing PSA>20 will need to get rpt imaging CT+ Bone scan to evaluate for radiographically evidence progression.  -No clinical symptoms suggestive of prostate cancer progression at this time. -PSA levels are gradually rising without any definitive symptoms of overt prostate cancer progression at this time. -Teeth extraction is complete and he has healed, Will restart Xgeva and continue every 6 weeks.  -will considering adding  Abiraterone+ Prednisone with progression   3) Elevated alkaline phosphatase level due to extensive bone mets. 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 Stable. Hg at 12.1 today (08/14/17) -would anticipate mild anemia from ADT  5) Hematuria due to local recurrence of prostate cancer - 1 episode of hematuria - now resolved  6) Anorexia with weight loss of about 15-20lbs. Notes improved po intake with Marinol. Patient notes he's been eating much better. . Wt Readings from Last 3 Encounters:  08/14/17 209 lb 4.8 oz (94.9 kg)  05/17/17 216 lb 11.2 oz (98.3 kg)  02/14/17 210 lb 9.6 oz (95.5 kg)    7) Vit D deficiency with hypocalcemia -- levels are normalizing. -Being aggressively replaced since his calcium levels have dropped after Xgeva likely reflecting significant bone calcium deficits from extensive healing bone lesions. -Continue ergocalciferol 50,000U weekly  -Continue Vit D daily    8) Neoplasm related pain - much improved patient is has not needed much of his prescribed pain medications Plan  -Continue Percocet when necessary for neoplasm related bone pains, refilled today  -Senna S for bowel prophylaxis   9) Colonic thickening in transverse colon thought to be related to spasm/incomplete distension of colon in this area. -would recommend pursuing colonoscopy with PCP --pending   -Percocet refilled today -Labs reveiwed and adequate to proceed with Lupron and Xgeva today  -continue Lupron q3 months-ongoing -restarting Xgeva from today -- please schedule q6weeks -ongoing -RTC with Dr Irene Limbo in 3 months with labs    All of the patients questions were answered with apparent satisfaction. The patient knows to call the clinic with any problems, questions or concerns.  . The total time spent in the appointment was 25 minutes and more than 50% was on counseling and direct patient cares.   Sullivan Lone MD [place] AAHIVMS [facility] Hematology/Oncology  Physician [place]  (Office):       [phone] (Work cell):  [phone] (Fax):           [phone]  This document serves as a record of services personally performed by Sullivan Lone, MD. It was created on his behalf by Joslyn Devon, a trained medical scribe. The creation of this record is based on the scribe's personal observations and the provider's statements to them.    .I have reviewed the above documentation for accuracy and completeness, and I agree with the above. [name] MD MS

## 2017-08-14 ENCOUNTER — Inpatient Hospital Stay: Payer: Medicare Other | Attending: Hematology | Admitting: Hematology

## 2017-08-14 ENCOUNTER — Inpatient Hospital Stay: Payer: Medicare Other

## 2017-08-14 ENCOUNTER — Encounter: Payer: Self-pay | Admitting: Hematology

## 2017-08-14 VITALS — BP 122/80 | HR 77 | Temp 98.5°F | Resp 18 | Ht 73.0 in | Wt 209.3 lb

## 2017-08-14 DIAGNOSIS — C61 Malignant neoplasm of prostate: Secondary | ICD-10-CM | POA: Insufficient documentation

## 2017-08-14 DIAGNOSIS — E559 Vitamin D deficiency, unspecified: Secondary | ICD-10-CM | POA: Diagnosis not present

## 2017-08-14 DIAGNOSIS — G893 Neoplasm related pain (acute) (chronic): Secondary | ICD-10-CM

## 2017-08-14 DIAGNOSIS — C7951 Secondary malignant neoplasm of bone: Secondary | ICD-10-CM | POA: Diagnosis not present

## 2017-08-14 DIAGNOSIS — Z5111 Encounter for antineoplastic chemotherapy: Secondary | ICD-10-CM | POA: Insufficient documentation

## 2017-08-14 DIAGNOSIS — Z87891 Personal history of nicotine dependence: Secondary | ICD-10-CM | POA: Diagnosis not present

## 2017-08-14 DIAGNOSIS — D479 Neoplasm of uncertain behavior of lymphoid, hematopoietic and related tissue, unspecified: Secondary | ICD-10-CM | POA: Diagnosis not present

## 2017-08-14 DIAGNOSIS — D72820 Lymphocytosis (symptomatic): Secondary | ICD-10-CM

## 2017-08-14 DIAGNOSIS — Z7189 Other specified counseling: Secondary | ICD-10-CM

## 2017-08-14 LAB — CBC WITH DIFFERENTIAL (CANCER CENTER ONLY)
BASOS ABS: 0 10*3/uL (ref 0.0–0.1)
BASOS PCT: 0 %
Eosinophils Absolute: 0.2 10*3/uL (ref 0.0–0.5)
Eosinophils Relative: 2 %
HEMATOCRIT: 38.8 % (ref 38.4–49.9)
HEMOGLOBIN: 12.1 g/dL — AB (ref 13.0–17.1)
LYMPHS ABS: 6.9 10*3/uL — AB (ref 0.9–3.3)
Lymphocytes Relative: 71 %
MCH: 27.1 pg — AB (ref 27.2–33.4)
MCHC: 31.2 g/dL — AB (ref 32.0–36.0)
MCV: 86.8 fL (ref 79.3–98.0)
MONOS PCT: 5 %
Monocytes Absolute: 0.5 10*3/uL (ref 0.1–0.9)
NEUTROS ABS: 2.1 10*3/uL (ref 1.5–6.5)
Neutrophils Relative %: 22 %
Platelet Count: 230 10*3/uL (ref 140–400)
RBC: 4.47 MIL/uL (ref 4.20–5.82)
RDW: 16 % — ABNORMAL HIGH (ref 11.0–14.6)
WBC Count: 9.6 10*3/uL (ref 4.0–10.3)

## 2017-08-14 LAB — CMP (CANCER CENTER ONLY)
ALT: 14 U/L (ref 0–55)
AST: 44 U/L — AB (ref 5–34)
Albumin: 4 g/dL (ref 3.5–5.0)
Alkaline Phosphatase: 235 U/L — ABNORMAL HIGH (ref 40–150)
Anion gap: 8 (ref 3–11)
BILIRUBIN TOTAL: 0.3 mg/dL (ref 0.2–1.2)
BUN: 15 mg/dL (ref 7–26)
CHLORIDE: 103 mmol/L (ref 98–109)
CO2: 27 mmol/L (ref 22–29)
Calcium: 10 mg/dL (ref 8.4–10.4)
Creatinine: 0.86 mg/dL (ref 0.70–1.30)
GFR, Est AFR Am: >60 mL/min (ref >60)
GLUCOSE: 91 mg/dL (ref 70–140)
Potassium: 4.3 mmol/L (ref 3.5–5.1)
Sodium: 138 mmol/L (ref 136–145)
Total Protein: 7.5 g/dL (ref 6.4–8.3)

## 2017-08-14 LAB — RETICULOCYTES
RBC.: 4.47 MIL/uL (ref 4.20–5.82)
RETIC CT PCT: 1.3 % (ref 0.8–1.8)
Retic Count, Absolute: 58.1 10*3/uL (ref 34.8–93.9)

## 2017-08-14 MED ORDER — LEUPROLIDE ACETATE (3 MONTH) 22.5 MG IM KIT
22.5000 mg | PACK | Freq: Once | INTRAMUSCULAR | Status: AC
  Administered 2017-08-14: 22.5 mg via INTRAMUSCULAR

## 2017-08-14 MED ORDER — DENOSUMAB 120 MG/1.7ML ~~LOC~~ SOLN
120.0000 mg | Freq: Once | SUBCUTANEOUS | Status: AC
  Administered 2017-08-14: 120 mg via SUBCUTANEOUS

## 2017-08-14 MED ORDER — DENOSUMAB 120 MG/1.7ML ~~LOC~~ SOLN
SUBCUTANEOUS | Status: AC
  Filled 2017-08-14: qty 1.7

## 2017-08-14 MED ORDER — OXYCODONE-ACETAMINOPHEN 10-325 MG PO TABS: 1.0000 | ORAL_TABLET | ORAL | 0 refills | Status: DC | PRN

## 2017-08-14 NOTE — Patient Instructions (Signed)
Thank you for choosing [facility] to provide your oncology and hematology care.  To afford each patient quality time with our providers, please arrive 30 minutes before your scheduled appointment time.  If you arrive late for your appointment, you may be asked to reschedule.  We strive to give you quality time with our providers, and arriving late affects you and other patients whose appointments are after yours.   If you are a no show for multiple scheduled visits, you may be dismissed from the clinic at the providers discretion.    Again, thank you for choosing [facility], our hope is that these requests will decrease the amount of time that you wait before being seen by our physicians.  ______________________________________________________________________  Should you have questions after your visit to the [facility], please contact our office at [phone] between the hours of 8:30 and 4:30 p.m.    Voicemails left after 4:30p.m will not be returned until the following business day.    For prescription refill requests, please have your pharmacy contact us directly.  Please also try to allow 48 hours for prescription requests.    Please contact the scheduling department for questions regarding scheduling.  For scheduling of procedures such as PET scans, CT scans, MRI, Ultrasound, etc please contact central scheduling at [phone].    Resources For Cancer Patients and Caregivers:   Oncolink.org:  A wonderful resource for patients and healthcare providers for information regarding your disease, ways to tract your treatment, what to expect, etc.     American Cancer Society:  800-227-2345  Can help patients locate various types of support and financial assistance  Cancer Care: 1-800-813-HOPE (4673) Provides financial assistance, online support groups, medication/co-pay assistance.    [place] DSS:  336-641-3447 Where to apply for food  stamps, Medicaid, and utility assistance  Medicare Rights Center: 800-333-4114 Helps people with Medicare understand their rights and benefits, navigate the Medicare system, and secure the quality healthcare they deserve  SCAT: 336-333-6589 [place] [employer]'s shared-ride transportation service for eligible riders who have a disability that prevents them from riding the fixed route bus.    For additional information on assistance programs please contact our social worker:   Grier Hock/Abigail Elmore:  [phone]            

## 2017-08-14 NOTE — Patient Instructions (Signed)
Denosumab injection What is this medicine? DENOSUMAB (den oh sue mab) slows bone breakdown. Prolia is used to treat osteoporosis in women after menopause and in men. Delton See is used to treat a high calcium level due to cancer and to prevent bone fractures and other bone problems caused by multiple myeloma or cancer bone metastases. Delton See is also used to treat giant cell tumor of the bone. This medicine may be used for other purposes; ask your health care provider or pharmacist if you have questions. COMMON BRAND NAME(S): Prolia, XGEVA What should I tell my health care provider before I take this medicine? They need to know if you have any of these conditions: -dental disease -having surgery or tooth extraction -infection -kidney disease -low levels of calcium or Vitamin D in the blood -malnutrition -on hemodialysis -skin conditions or sensitivity -thyroid or parathyroid disease -an unusual reaction to denosumab, other medicines, foods, dyes, or preservatives -pregnant or trying to get pregnant -breast-feeding How should I use this medicine? This medicine is for injection under the skin. It is given by a health care professional in a hospital or clinic setting. If you are getting Prolia, a special MedGuide will be given to you by the pharmacist with each prescription and refill. Be sure to read this information carefully each time. For Prolia, talk to your pediatrician regarding the use of this medicine in children. Special care may be needed. For Delton See, talk to your pediatrician regarding the use of this medicine in children. While this drug may be prescribed for children as young as 13 years for selected conditions, precautions do apply. Overdosage: If you think you have taken too much of this medicine contact a poison control center or emergency room at once. NOTE: This medicine is only for you. Do not share this medicine with others. What if I miss a dose? It is important not to miss your  dose. Call your doctor or health care professional if you are unable to keep an appointment. What may interact with this medicine? Do not take this medicine with any of the following medications: -other medicines containing denosumab This medicine may also interact with the following medications: -medicines that lower your chance of fighting infection -steroid medicines like prednisone or cortisone This list may not describe all possible interactions. Give your health care provider a list of all the medicines, herbs, non-prescription drugs, or dietary supplements you use. Also tell them if you smoke, drink alcohol, or use illegal drugs. Some items may interact with your medicine. What should I watch for while using this medicine? Visit your doctor or health care professional for regular checks on your progress. Your doctor or health care professional may order blood tests and other tests to see how you are doing. Call your doctor or health care professional for advice if you get a fever, chills or sore throat, or other symptoms of a cold or flu. Do not treat yourself. This drug may decrease your body's ability to fight infection. Try to avoid being around people who are sick. You should make sure you get enough calcium and vitamin D while you are taking this medicine, unless your doctor tells you not to. Discuss the foods you eat and the vitamins you take with your health care professional. See your dentist regularly. Brush and floss your teeth as directed. Before you have any dental work done, tell your dentist you are receiving this medicine. Do not become pregnant while taking this medicine or for 5 months after stopping  it. Talk with your doctor or health care professional about your birth control options while taking this medicine. Women should inform their doctor if they wish to become pregnant or think they might be pregnant. There is a potential for serious side effects to an unborn child. Talk  to your health care professional or pharmacist for more information. What side effects may I notice from receiving this medicine? Side effects that you should report to your doctor or health care professional as soon as possible: -allergic reactions like skin rash, itching or hives, swelling of the face, lips, or tongue -bone pain -breathing problems -dizziness -jaw pain, especially after dental work -redness, blistering, peeling of the skin -signs and symptoms of infection like fever or chills; cough; sore throat; pain or trouble passing urine -signs of low calcium like fast heartbeat, muscle cramps or muscle pain; pain, tingling, numbness in the hands or feet; seizures -unusual bleeding or bruising -unusually weak or tired Side effects that usually do not require medical attention (report to your doctor or health care professional if they continue or are bothersome): -constipation -diarrhea -headache -joint pain -loss of appetite -muscle pain -runny nose -tiredness -upset stomach This list may not describe all possible side effects. Call your doctor for medical advice about side effects. You may report side effects to FDA at 1-800-FDA-1088. Where should I keep my medicine? This medicine is only given in a clinic, doctor's office, or other health care setting and will not be stored at home. NOTE: This sheet is a summary. It may not cover all possible information. If you have questions about this medicine, talk to your doctor, pharmacist, or health care provider.  2018 Elsevier/Gold Standard (2016-04-04 19:17:21) Leuprolide depot injection What is this medicine? LEUPROLIDE (loo PROE lide) is a man-made protein that acts like a natural hormone in the body. It decreases testosterone in men and decreases estrogen in women. In men, this medicine is used to treat advanced prostate cancer. In women, some forms of this medicine may be used to treat endometriosis, uterine fibroids, or other  male hormone-related problems. This medicine may be used for other purposes; ask your health care provider or pharmacist if you have questions. COMMON BRAND NAME(S): Eligard, Lupron Depot, Lupron Depot-Ped, Viadur What should I tell my health care provider before I take this medicine? They need to know if you have any of these conditions: -diabetes -heart disease or previous heart attack -high blood pressure -high cholesterol -mental illness -osteoporosis -pain or difficulty passing urine -seizures -spinal cord metastasis -stroke -suicidal thoughts, plans, or attempt; a previous suicide attempt by you or a family member -tobacco smoker -unusual vaginal bleeding (women) -an unusual or allergic reaction to leuprolide, benzyl alcohol, other medicines, foods, dyes, or preservatives -pregnant or trying to get pregnant -breast-feeding How should I use this medicine? This medicine is for injection into a muscle or for injection under the skin. It is given by a health care professional in a hospital or clinic setting. The specific product will determine how it will be given to you. Make sure you understand which product you receive and how often you will receive it. Talk to your pediatrician regarding the use of this medicine in children. Special care may be needed. Overdosage: If you think you have taken too much of this medicine contact a poison control center or emergency room at once. NOTE: This medicine is only for you. Do not share this medicine with others. What if I miss a dose? It is  important not to miss a dose. Call your doctor or health care professional if you are unable to keep an appointment. Depot injections: Depot injections are given either once-monthly, every 12 weeks, every 16 weeks, or every 24 weeks depending on the product you are prescribed. The product you are prescribed will be based on if you are male or male, and your condition. Make sure you understand your  product and dosing. What may interact with this medicine? Do not take this medicine with any of the following medications: -chasteberry This medicine may also interact with the following medications: -herbal or dietary supplements, like black cohosh or DHEA -male hormones, like estrogens or progestins and birth control pills, patches, rings, or injections -male hormones, like testosterone This list may not describe all possible interactions. Give your health care provider a list of all the medicines, herbs, non-prescription drugs, or dietary supplements you use. Also tell them if you smoke, drink alcohol, or use illegal drugs. Some items may interact with your medicine. What should I watch for while using this medicine? Visit your doctor or health care professional for regular checks on your progress. During the first weeks of treatment, your symptoms may get worse, but then will improve as you continue your treatment. You may get hot flashes, increased bone pain, increased difficulty passing urine, or an aggravation of nerve symptoms. Discuss these effects with your doctor or health care professional, some of them may improve with continued use of this medicine. Male patients may experience a menstrual cycle or spotting during the first months of therapy with this medicine. If this continues, contact your doctor or health care professional. What side effects may I notice from receiving this medicine? Side effects that you should report to your doctor or health care professional as soon as possible: -allergic reactions like skin rash, itching or hives, swelling of the face, lips, or tongue -breathing problems -chest pain -depression or memory disorders -pain in your legs or groin -pain at site where injected or implanted -seizures -severe headache -swelling of the feet and legs -suicidal thoughts or other mood changes -visual changes -vomiting Side effects that usually do not require  medical attention (report to your doctor or health care professional if they continue or are bothersome): -breast swelling or tenderness -decrease in sex drive or performance -diarrhea -hot flashes -loss of appetite -muscle, joint, or bone pains -nausea -redness or irritation at site where injected or implanted -skin problems or acne This list may not describe all possible side effects. Call your doctor for medical advice about side effects. You may report side effects to FDA at 1-800-FDA-1088. Where should I keep my medicine? This drug is given in a hospital or clinic and will not be stored at home. NOTE: This sheet is a summary. It may not cover all possible information. If you have questions about this medicine, talk to your doctor, pharmacist, or health care provider.  2018 Elsevier/Gold Standard (2015-08-26 09:45:53)

## 2017-08-15 LAB — VITAMIN D 25 HYDROXY (VIT D DEFICIENCY, FRACTURES): Vit D, 25-Hydroxy: 30.8 ng/mL (ref 30.0–100.0)

## 2017-08-15 LAB — PROSTATE-SPECIFIC AG, SERUM (LABCORP): Prostate Specific Ag, Serum: 14.9 ng/mL — ABNORMAL HIGH (ref 0.0–4.0)

## 2017-08-17 ENCOUNTER — Telehealth: Payer: Self-pay

## 2017-08-17 LAB — FLOW CYTOMETRY

## 2017-08-17 NOTE — Telephone Encounter (Signed)
Spokme with patient concerning changes in his schedule. Per 5/24 phone msg

## 2017-08-21 DIAGNOSIS — H833X3 Noise effects on inner ear, bilateral: Secondary | ICD-10-CM | POA: Diagnosis not present

## 2017-08-21 DIAGNOSIS — H903 Sensorineural hearing loss, bilateral: Secondary | ICD-10-CM | POA: Diagnosis not present

## 2017-08-21 DIAGNOSIS — H9113 Presbycusis, bilateral: Secondary | ICD-10-CM | POA: Diagnosis not present

## 2017-09-25 ENCOUNTER — Inpatient Hospital Stay: Payer: Medicare Other | Attending: Hematology

## 2017-09-25 MED ORDER — DENOSUMAB 120 MG/1.7ML ~~LOC~~ SOLN
SUBCUTANEOUS | Status: AC
  Filled 2017-09-25: qty 1.7

## 2017-11-05 DIAGNOSIS — W1830XA Fall on same level, unspecified, initial encounter: Secondary | ICD-10-CM | POA: Diagnosis not present

## 2017-11-05 DIAGNOSIS — S42002A Fracture of unspecified part of left clavicle, initial encounter for closed fracture: Secondary | ICD-10-CM | POA: Diagnosis not present

## 2017-11-05 DIAGNOSIS — S2232XA Fracture of one rib, left side, initial encounter for closed fracture: Secondary | ICD-10-CM | POA: Diagnosis not present

## 2017-11-05 DIAGNOSIS — T148XXA Other injury of unspecified body region, initial encounter: Secondary | ICD-10-CM | POA: Diagnosis not present

## 2017-11-05 DIAGNOSIS — Z8546 Personal history of malignant neoplasm of prostate: Secondary | ICD-10-CM | POA: Diagnosis not present

## 2017-11-06 ENCOUNTER — Ambulatory Visit: Payer: Self-pay

## 2017-11-13 NOTE — Progress Notes (Signed)
HEMATOLOGY/ONCOLOGY CLINIC NOTE  Date of Service: 11/14/17   Patient Care Team: [name], NP as PCP - General (Internal Medicine)  CHIEF COMPLAINTS/PURPOSE OF CONSULTATION:   F/u for continue mx of metastatic prostate cancer  HISTORY OF PRESENTING ILLNESS:  Eric Enyeart. is a wonderful 72 y.o. male who has been referred to [place] from the [facility] ED by [name] PA-C for evaluation and management of metastatic malignancy concerning for metastatic prostate cancer.  Patient has had a h/o locally advanced prostate cancer diagnosed in 2014 and had a radical prostatectomy and pelvic LN dissection by Dr [name] in 10/2012. Pathology showed pT3b pN0 disease (with positive margins, extraprostatic extension, seminal vesicle involvement and angiolymphatic invasion). Bone scan was negative for metastatic disease. Patient report no post-op RT. He notes that he received lupron shots as per his urologist for 1 year post-operatively. He was being monitored by Dr [name] and last had clinical evaluation and PSA about 25months ago - PSA level not available in our system. He notes that he was lost to f/u since then.  Patient presented to the ED with worsening lower and middle back pain for 2 weeks. Also notes about 15-20lbs weight loss and anorexia over the last 6-8weeks. He also notes hematuria. No fevers/chills. Patient had a CT abd/pelvis in the ED which showed Innumerable sclerotic lesions in all imaged bones consistent metastatic disease. The prostate gland has an irregular appearance and the patient may have prostate carcinoma. A few enlarged retroperitoneal lymph nodes are identified worrisome for additional foci of metastatic disease.  Patient's pain was somewhat controlled with prn percocet in ED and he was discharged home with f/u with [place]. Patient notes that the patient wakes him up in the night.  No urinary retention. No loss of bowel or bladder control. No new  neurological symptoms in his extremities.   INTERVAL HISTORY   Eric Corlett. is here to follow up of his metastatic prostatic cancer and next Lupron shot. The patient's last visit with [place] was on 08/14/17. The pt reports that he is doing well overall.   The pt reports that he recently tripped over a curb, notes that he was evaluated in [place] , [place] with a CXR that he says revealed something significant. The pt notes that his back pain has increased after his fall. He notes that he is taking Percocet once in the morning and once in the evening.   The pt notes that he hasn't had any difficulty urinating but last week he had an isolated episode of blood in the urine.   Lab results today (11/14/17) of CBC w/diff, CMP, and Reticulocytes is as follows: all values are WNL except for WBC at 11.8k, HGB at 11.9, HCT at 37.7, MCHC at 31.6, RDW at 15.3, Lymphs abs at 7.7k.  On review of systems, pt reports worsened back pain, recent blood in the urine, and denies fevers, chills, difficulty urinating, leg swelling, abdominal pains, and any other symptoms.   MEDICAL HISTORY:   Past Medical History:  Diagnosis Date  . Arthritis    left knee, left shoulder  . Erectile dysfunction 06/25/2012  . Foley catheter in place   . Goiter 06/25/2012  . H/O hiatal hernia   . Hematuria    occasional  . Hemorrhoid   . History of bladder infections   . Joint pain     SURGICAL HISTORY: Past Surgical History:  Procedure Laterality Date  . INGUINAL HERNIA  REPAIR  8 yrs ago  . LYMPHADENECTOMY Bilateral 11/20/2012   Procedure: WITH BILATERAL PELVIC LYMPH NODE DISSECTION ;  Surgeon: [name], MD;  Location: [facility];  Service: Urology;  Laterality: Bilateral;  . ROBOT ASSISTED LAPAROSCOPIC RADICAL PROSTATECTOMY N/A 11/20/2012   Procedure: ROBOTIC ASSISTED LAPAROSCOPIC RADICAL PROSTATECTOMY;  Surgeon: [name], MD;  Location: [facility];  Service: Urology;  Laterality: N/A;    SOCIAL HISTORY: Social History    Socioeconomic History  . Marital status: Divorced    Spouse name: Not on file  . Number of children: Not on file  . Years of education: 16  . Highest education level: Not on file  Occupational History  . Occupation: Retired    Comment: Education officer, community  . Financial resource strain: Not on file  . Food insecurity:    Worry: Not on file    Inability: Not on file  . Transportation needs:    Medical: Not on file    Non-medical: Not on file  Tobacco Use  . Smoking status: Former Smoker    Packs/day: 0.25    Years: 25.00    Pack years: 6.25    Types: Cigarettes  . Smokeless tobacco: Former Systems developer  . Tobacco comment: quit date 2016  Substance and Sexual Activity  . Alcohol use: Yes    Comment: occasional beer   . Drug use: Yes    Types: Marijuana    Comment: 3 x a month uses marijuana  . Sexual activity: Yes  Lifestyle  . Physical activity:    Days per week: Not on file    Minutes per session: Not on file  . Stress: Not on file  Relationships  . Social connections:    Talks on phone: Not on file    Gets together: Not on file    Attends religious service: Not on file    Active member of club or organization: Not on file    Attends meetings of clubs or organizations: Not on file    Relationship status: Not on file  . Intimate partner violence:    Fear of current or ex partner: Not on file    Emotionally abused: Not on file    Physically abused: Not on file    Forced sexual activity: Not on file  Other Topics Concern  . Not on file  Social History Narrative   Regular exercise-no   Caffeine Use-yes    FAMILY HISTORY: Family History  Problem Relation Age of Onset  . Heart disease Sister   . Stroke Sister   . Colon cancer Paternal Uncle   . Diabetes Neg Hx     ALLERGIES:  is allergic to heparin and poison ivy extract [poison ivy extract].  MEDICATIONS:  Current Outpatient Medications  Medication Sig Dispense Refill  . denosumab (XGEVA) 120 MG/1.7ML SOLN  injection Inject 120 mg into the skin once.    [name] leuprolide (LUPRON) 22.5 MG injection Inject 22.5 mg into the muscle every 3 (three) months.    [name] oxyCODONE-acetaminophen (PERCOCET) 10-325 MG tablet Take 1 tablet by mouth every 4 (four) hours as needed for pain. 60 tablet 0  . Vitamin D, Ergocalciferol, (DRISDOL) 50000 units CAPS capsule Take 1 capsule (50,000 Units total) by mouth 2 (two) times a week. 24 capsule 0   No current facility-administered medications for this visit.     REVIEW OF SYSTEMS:   A 10+ POINT REVIEW OF SYSTEMS WAS OBTAINED including neurology, dermatology, psychiatry, cardiac, respiratory, lymph,  extremities, GI, GU, Musculoskeletal, constitutional, breasts, reproductive, HEENT.  All pertinent positives are noted in the HPI.  All others are negative.  PHYSICAL EXAMINATION: ECOG PERFORMANCE STATUS: 2 - Symptomatic, <50% confined to bed  . Vitals:   11/14/17 1215  BP: 138/78  Pulse: 73  Resp: 18  Temp: 98.6 F (37 C)  SpO2: 100%   Filed Weights   11/14/17 1215  Weight: 199 lb 6.4 oz (90.4 kg)   .Body mass index is 26.31 kg/m. . Wt Readings from Last 3 Encounters:  11/14/17 199 lb 6.4 oz (90.4 kg)  08/14/17 209 lb 4.8 oz (94.9 kg)  05/17/17 216 lb 11.2 oz (98.3 kg)    GENERAL:alert, in no acute distress and comfortable SKIN: no acute rashes, no significant lesions EYES: conjunctiva are pink and non-injected, sclera anicteric OROPHARYNX: MMM, no exudates, no oropharyngeal erythema or ulceration NECK: supple, no JVD LYMPH:  no palpable lymphadenopathy in the cervical, axillary or inguinal regions LUNGS: clear to auscultation b/l with normal respiratory effort HEART: regular rate & rhythm ABDOMEN:  normoactive bowel sounds , non tender, not distended. No palpable hepatosplenomegaly.  Extremity: no pedal edema PSYCH: alert & oriented x 3 with fluent speech NEURO: no focal motor/sensory deficits   LABORATORY DATA:  I have reviewed the data as  listed  . CBC Latest Ref Rng & Units 11/14/2017 08/14/2017 05/17/2017  WBC 4.0 - 10.3 K/uL 11.8(H) 9.6 10.1  Hemoglobin 13.0 - 17.1 g/dL 11.9(L) 12.1(L) 11.7(L)  Hematocrit 38.4 - 49.9 % 37.7(L) 38.8 37.2(L)  Platelets 140 - 400 K/uL 320 230 259    CMP Latest Ref Rng & Units 08/14/2017 05/17/2017 02/14/2017  Glucose 70 - 140 mg/dL 91 90 70  BUN 7 - 26 mg/dL 15 8 16.6  Creatinine 0.70 - 1.30 mg/dL 0.86 0.74 0.7  Sodium 136 - 145 mmol/L 138 139 140  Potassium 3.5 - 5.1 mmol/L 4.3 3.8 3.9  Chloride 98 - 109 mmol/L 103 101 -  CO2 22 - 29 mmol/L 27 30(H) 27  Calcium 8.4 - 10.4 mg/dL 10.0 9.8 9.6  Total Protein 6.4 - 8.3 g/dL 7.5 7.2 7.1  Total Bilirubin 0.2 - 1.2 mg/dL 0.3 0.3 0.40  Alkaline Phos 40 - 150 U/L 235(H) 149 273(H)  AST 5 - 34 U/L 44(H) 39(H) 40(H)  ALT 0 - 55 U/L 14 16 20              RADIOGRAPHIC STUDIES: I have personally reviewed the radiological images as listed and agreed with the findings in the report. No results found.  ASSESSMENT & PLAN:   72 y.o. AAM with previous h/o of locally advanced pT3b,pN0 prostate cancer now with concern for newly noted metastatic prostate cancer.  1) Metastatic Prostate Cancer  PSA level 550 pre-treatment and have improved significantly and were down to 0.6. Now gradually rising again and if upt o 14.9  without any other associated new symptoms. Patient had been noted to have extensive osseous metastatic disease throughout the spine and bilaterally in the calvarium as well. Also noted to have local recurrence in the prostatic bed, retroperitoneal Lnadenoapathy and some mediastinal LNadenoapathy.  ECOG PS 1 Previously locally advanced pT3b,pN0 diagnosed in 10/2012 s/p radical prostatectomy and ADT x 1 yr. Testosterone level 7 -- is dropping appropriately with treatment. S/p 6 cycles of taxotere.  2) Antierior mandibular pain  Orthopantogram done - showed IMPRESSION: Residual teeth 23-26 with decay.  No mandibular osseous  lesion seen Patient has completed dental extractions and is using dentures  and   PLAN  -Lupron shot today and continue every 3 months .  -if further increasing PSA>20 will need to get rpt imaging CT+ Bone scan to evaluate for radiographically evidence progression. . -Teeth extraction is complete and he has healed -Restart Xgeva every 4 weeks -will considering adding Abiraterone+ Prednisone with progression -Discussed pt labwork today, 11/14/17; blood counts are stable overall, PSA and chemistries are pending  -Patient's PSA levels have increased, will order PET/CT -Will determine if new treatment course is indicated  -Continue of Vitamin D replacement -Continue Lupron injection every 12 weeks -Continue Xgeva every 4 weeks -Will see the pt back in 2 weeks, sooner if any new concerns arise    3) Elevated alkaline phosphatase level due to extensive bone mets. 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 Stable. Hg at 12.1 today (08/14/17) -would anticipate mild anemia from ADT  5) Hematuria due to local recurrence of prostate cancer - 1 episode of hematuria - now resolved  6) Anorexia with weight loss of about 15-20lbs. Notes improved po intake with Marinol. Patient notes he's been eating much better. . Wt Readings from Last 3 Encounters:  11/14/17 199 lb 6.4 oz (90.4 kg)  08/14/17 209 lb 4.8 oz (94.9 kg)  05/17/17 216 lb 11.2 oz (98.3 kg)    7) Vit D deficiency with hypocalcemia -- levels are normalizing. -Being aggressively replaced since his calcium levels have dropped after Xgeva likely reflecting significant bone calcium deficits from extensive healing bone lesions. -Continue ergocalciferol 50,000U weekly  -Continue Vit D daily    8) Neoplasm related pain - much improved patient is has not needed much of his prescribed pain medications Plan  -Continue Percocet when necessary for neoplasm related bone pains, refilled today  -Senna S for bowel  prophylaxis  9) Colonic thickening in transverse colon thought to be related to spasm/incomplete distension of colon in this area. -would recommend pursuing colonoscopy with PCP --pending   -additional labs today -continue Xgeva q4weeks -continue Lupron q12weeks -PET/CT in 1 week -RTC with Dr Irene Limbo in 2 weeks   All of the patients questions were answered with apparent satisfaction. The patient knows to call the clinic with any problems, questions or concerns. The total time spent in the appt was 25 minutes and more than 50% was on counseling and direct patient cares.   Sullivan Lone MD MS AAHIVMS [facility] Hematology/Oncology Physician [facility]  (Office):       [phone] (Work cell):  [phone] (Fax):           [phone]  I, Baldwin Jamaica, am acting as a scribe for Dr. Irene Limbo  .I have reviewed the above documentation for accuracy and completeness, and I agree with the above. [name] MD

## 2017-11-14 ENCOUNTER — Encounter: Payer: Self-pay | Admitting: Hematology

## 2017-11-14 ENCOUNTER — Telehealth: Payer: Self-pay | Admitting: Hematology

## 2017-11-14 ENCOUNTER — Inpatient Hospital Stay: Payer: Medicare Other

## 2017-11-14 ENCOUNTER — Inpatient Hospital Stay: Payer: Medicare Other | Attending: Hematology | Admitting: Hematology

## 2017-11-14 VITALS — BP 138/78 | HR 73 | Temp 98.6°F | Resp 18 | Ht 73.0 in | Wt 199.4 lb

## 2017-11-14 DIAGNOSIS — M545 Low back pain: Secondary | ICD-10-CM

## 2017-11-14 DIAGNOSIS — E559 Vitamin D deficiency, unspecified: Secondary | ICD-10-CM | POA: Diagnosis not present

## 2017-11-14 DIAGNOSIS — R630 Anorexia: Secondary | ICD-10-CM

## 2017-11-14 DIAGNOSIS — C61 Malignant neoplasm of prostate: Secondary | ICD-10-CM

## 2017-11-14 DIAGNOSIS — Z87891 Personal history of nicotine dependence: Secondary | ICD-10-CM

## 2017-11-14 DIAGNOSIS — R634 Abnormal weight loss: Secondary | ICD-10-CM | POA: Diagnosis not present

## 2017-11-14 DIAGNOSIS — C7951 Secondary malignant neoplasm of bone: Secondary | ICD-10-CM | POA: Insufficient documentation

## 2017-11-14 DIAGNOSIS — Z5111 Encounter for antineoplastic chemotherapy: Secondary | ICD-10-CM | POA: Insufficient documentation

## 2017-11-14 DIAGNOSIS — K6389 Other specified diseases of intestine: Secondary | ICD-10-CM | POA: Diagnosis not present

## 2017-11-14 DIAGNOSIS — G893 Neoplasm related pain (acute) (chronic): Secondary | ICD-10-CM

## 2017-11-14 DIAGNOSIS — Z7189 Other specified counseling: Secondary | ICD-10-CM

## 2017-11-14 DIAGNOSIS — Z800 Family history of malignant neoplasm of digestive organs: Secondary | ICD-10-CM | POA: Diagnosis not present

## 2017-11-14 LAB — CMP (CANCER CENTER ONLY)
ALT: 12 U/L (ref 0–44)
AST: 48 U/L — AB (ref 15–41)
Albumin: 3.7 g/dL (ref 3.5–5.0)
Alkaline Phosphatase: 625 U/L — ABNORMAL HIGH (ref 38–126)
Anion gap: 8 (ref 5–15)
BUN: 15 mg/dL (ref 8–23)
CHLORIDE: 101 mmol/L (ref 98–111)
CO2: 28 mmol/L (ref 22–32)
Calcium: 9 mg/dL (ref 8.9–10.3)
Creatinine: 0.75 mg/dL (ref 0.61–1.24)
Glucose, Bld: 92 mg/dL (ref 70–99)
POTASSIUM: 4.4 mmol/L (ref 3.5–5.1)
SODIUM: 137 mmol/L (ref 135–145)
Total Bilirubin: <0.2 mg/dL — ABNORMAL LOW (ref 0.3–1.2)
Total Protein: 7.7 g/dL (ref 6.5–8.1)

## 2017-11-14 LAB — CBC WITH DIFFERENTIAL (CANCER CENTER ONLY)
Basophils Absolute: 0 10*3/uL (ref 0.0–0.1)
Basophils Relative: 0 %
EOS PCT: 2 %
Eosinophils Absolute: 0.2 10*3/uL (ref 0.0–0.5)
HEMATOCRIT: 37.7 % — AB (ref 38.4–49.9)
Hemoglobin: 11.9 g/dL — ABNORMAL LOW (ref 13.0–17.1)
LYMPHS PCT: 65 %
Lymphs Abs: 7.7 10*3/uL — ABNORMAL HIGH (ref 0.9–3.3)
MCH: 27.3 pg (ref 27.2–33.4)
MCHC: 31.6 g/dL — AB (ref 32.0–36.0)
MCV: 86.5 fL (ref 79.3–98.0)
MONO ABS: 0.5 10*3/uL (ref 0.1–0.9)
MONOS PCT: 4 %
NEUTROS ABS: 3.4 10*3/uL (ref 1.5–6.5)
Neutrophils Relative %: 29 %
PLATELETS: 320 10*3/uL (ref 140–400)
RBC: 4.36 MIL/uL (ref 4.20–5.82)
RDW: 15.3 % — AB (ref 11.0–14.6)
WBC Count: 11.8 10*3/uL — ABNORMAL HIGH (ref 4.0–10.3)

## 2017-11-14 LAB — RETICULOCYTES
RBC.: 4.36 MIL/uL (ref 4.20–5.82)
Retic Count, Absolute: 61 10*3/uL (ref 34.8–93.9)
Retic Ct Pct: 1.4 % (ref 0.8–1.8)

## 2017-11-14 MED ORDER — DENOSUMAB 120 MG/1.7ML ~~LOC~~ SOLN
120.0000 mg | Freq: Once | SUBCUTANEOUS | Status: AC
  Administered 2017-11-14: 120 mg via SUBCUTANEOUS

## 2017-11-14 MED ORDER — LEUPROLIDE ACETATE (3 MONTH) 22.5 MG IM KIT
22.5000 mg | PACK | Freq: Once | INTRAMUSCULAR | Status: AC
  Administered 2017-11-14: 22.5 mg via INTRAMUSCULAR

## 2017-11-14 MED ORDER — DENOSUMAB 120 MG/1.7ML ~~LOC~~ SOLN
SUBCUTANEOUS | Status: AC
  Filled 2017-11-14: qty 1.7

## 2017-11-14 MED ORDER — LEUPROLIDE ACETATE (3 MONTH) 22.5 MG IM KIT
PACK | INTRAMUSCULAR | Status: AC
  Filled 2017-11-14: qty 22.5

## 2017-11-14 MED ORDER — OXYCODONE-ACETAMINOPHEN 10-325 MG PO TABS: 1.0000 | ORAL_TABLET | ORAL | 0 refills | Status: DC | PRN

## 2017-11-14 NOTE — Progress Notes (Signed)
Pt. Tolerated both injections well, No further problems or concerns noted.

## 2017-11-14 NOTE — Patient Instructions (Signed)
Leuprolide injection What is this medicine? LEUPROLIDE (loo PROE lide) is a man-made hormone. It is used to treat the symptoms of prostate cancer. This medicine may also be used to treat children with early onset of puberty. It may be used for other hormonal conditions. This medicine may be used for other purposes; ask your health care provider or pharmacist if you have questions. COMMON BRAND NAME(S): Lupron What should I tell my health care provider before I take this medicine? They need to know if you have any of these conditions: -diabetes -heart disease or previous heart attack -high blood pressure -high cholesterol -pain or difficulty passing urine -spinal cord metastasis -stroke -tobacco smoker -an unusual or allergic reaction to leuprolide, benzyl alcohol, other medicines, foods, dyes, or preservatives -pregnant or trying to get pregnant -breast-feeding How should I use this medicine? This medicine is for injection under the skin or into a muscle. You will be taught how to prepare and give this medicine. Use exactly as directed. Take your medicine at regular intervals. Do not take your medicine more often than directed. It is important that you put your used needles and syringes in a special sharps container. Do not put them in a trash can. If you do not have a sharps container, call your pharmacist or healthcare provider to get one. A special MedGuide will be given to you by the pharmacist with each prescription and refill. Be sure to read this information carefully each time. Talk to your pediatrician regarding the use of this medicine in children. While this medicine may be prescribed for children as young as 8 years for selected conditions, precautions do apply. Overdosage: If you think you have taken too much of this medicine contact a poison control center or emergency room at once. NOTE: This medicine is only for you. Do not share this medicine with others. What if I miss a  dose? If you miss a dose, take it as soon as you can. If it is almost time for your next dose, take only that dose. Do not take double or extra doses. What may interact with this medicine? Do not take this medicine with any of the following medications: -chasteberry This medicine may also interact with the following medications: -herbal or dietary supplements, like black cohosh or DHEA -male hormones, like estrogens or progestins and birth control pills, patches, rings, or injections -male hormones, like testosterone This list may not describe all possible interactions. Give your health care provider a list of all the medicines, herbs, non-prescription drugs, or dietary supplements you use. Also tell them if you smoke, drink alcohol, or use illegal drugs. Some items may interact with your medicine. What should I watch for while using this medicine? Visit your doctor or health care professional for regular checks on your progress. During the first week, your symptoms may get worse, but then will improve as you continue your treatment. You may get hot flashes, increased bone pain, increased difficulty passing urine, or an aggravation of nerve symptoms. Discuss these effects with your doctor or health care professional, some of them may improve with continued use of this medicine. Male patients may experience a menstrual cycle or spotting during the first 2 months of therapy with this medicine. If this continues, contact your doctor or health care professional. What side effects may I notice from receiving this medicine? Side effects that you should report to your doctor or health care professional as soon as possible: -allergic reactions like skin rash, itching or  hives, swelling of the face, lips, or tongue -breathing problems -chest pain -depression or memory disorders -pain in your legs or groin -pain at site where injected -severe headache -swelling of the feet and legs -visual  changes -vomiting Side effects that usually do not require medical attention (report to your doctor or health care professional if they continue or are bothersome): -breast swelling or tenderness -decrease in sex drive or performance -diarrhea -hot flashes -loss of appetite -muscle, joint, or bone pains -nausea -redness or irritation at site where injected -skin problems or acne This list may not describe all possible side effects. Call your doctor for medical advice about side effects. You may report side effects to FDA at 1-800-FDA-1088. Where should I keep my medicine? Keep out of the reach of children. Store below 25 degrees C (77 degrees F). Do not freeze. Protect from light. Do not use if it is not clear or if there are particles present. Throw away any unused medicine after the expiration date. NOTE: This sheet is a summary. It may not cover all possible information. If you have questions about this medicine, talk to your doctor, pharmacist, or health care provider.  2018 Elsevier/Gold Standard (2015-11-01 10:54:35) Denosumab injection What is this medicine? DENOSUMAB (den oh sue mab) slows bone breakdown. Prolia is used to treat osteoporosis in women after menopause and in men. Xgeva is used to treat a high calcium level due to cancer and to prevent bone fractures and other bone problems caused by multiple myeloma or cancer bone metastases. Xgeva is also used to treat giant cell tumor of the bone. This medicine may be used for other purposes; ask your health care provider or pharmacist if you have questions. COMMON BRAND NAME(S): Prolia, XGEVA What should I tell my health care provider before I take this medicine? They need to know if you have any of these conditions: -dental disease -having surgery or tooth extraction -infection -kidney disease -low levels of calcium or Vitamin D in the blood -malnutrition -on hemodialysis -skin conditions or sensitivity -thyroid or  parathyroid disease -an unusual reaction to denosumab, other medicines, foods, dyes, or preservatives -pregnant or trying to get pregnant -breast-feeding How should I use this medicine? This medicine is for injection under the skin. It is given by a health care professional in a hospital or clinic setting. If you are getting Prolia, a special MedGuide will be given to you by the pharmacist with each prescription and refill. Be sure to read this information carefully each time. For Prolia, talk to your pediatrician regarding the use of this medicine in children. Special care may be needed. For Xgeva, talk to your pediatrician regarding the use of this medicine in children. While this drug may be prescribed for children as young as 13 years for selected conditions, precautions do apply. Overdosage: If you think you have taken too much of this medicine contact a poison control center or emergency room at once. NOTE: This medicine is only for you. Do not share this medicine with others. What if I miss a dose? It is important not to miss your dose. Call your doctor or health care professional if you are unable to keep an appointment. What may interact with this medicine? Do not take this medicine with any of the following medications: -other medicines containing denosumab This medicine may also interact with the following medications: -medicines that lower your chance of fighting infection -steroid medicines like prednisone or cortisone This list may not describe all possible interactions.   Give your health care provider a list of all the medicines, herbs, non-prescription drugs, or dietary supplements you use. Also tell them if you smoke, drink alcohol, or use illegal drugs. Some items may interact with your medicine. What should I watch for while using this medicine? Visit your doctor or health care professional for regular checks on your progress. Your doctor or health care professional may order  blood tests and other tests to see how you are doing. Call your doctor or health care professional for advice if you get a fever, chills or sore throat, or other symptoms of a cold or flu. Do not treat yourself. This drug may decrease your body's ability to fight infection. Try to avoid being around people who are sick. You should make sure you get enough calcium and vitamin D while you are taking this medicine, unless your doctor tells you not to. Discuss the foods you eat and the vitamins you take with your health care professional. See your dentist regularly. Brush and floss your teeth as directed. Before you have any dental work done, tell your dentist you are receiving this medicine. Do not become pregnant while taking this medicine or for 5 months after stopping it. Talk with your doctor or health care professional about your birth control options while taking this medicine. Women should inform their doctor if they wish to become pregnant or think they might be pregnant. There is a potential for serious side effects to an unborn child. Talk to your health care professional or pharmacist for more information. What side effects may I notice from receiving this medicine? Side effects that you should report to your doctor or health care professional as soon as possible: -allergic reactions like skin rash, itching or hives, swelling of the face, lips, or tongue -bone pain -breathing problems -dizziness -jaw pain, especially after dental work -redness, blistering, peeling of the skin -signs and symptoms of infection like fever or chills; cough; sore throat; pain or trouble passing urine -signs of low calcium like fast heartbeat, muscle cramps or muscle pain; pain, tingling, numbness in the hands or feet; seizures -unusual bleeding or bruising -unusually weak or tired Side effects that usually do not require medical attention (report to your doctor or health care professional if they continue or are  bothersome): -constipation -diarrhea -headache -joint pain -loss of appetite -muscle pain -runny nose -tiredness -upset stomach This list may not describe all possible side effects. Call your doctor for medical advice about side effects. You may report side effects to FDA at 1-800-FDA-1088. Where should I keep my medicine? This medicine is only given in a clinic, doctor's office, or other health care setting and will not be stored at home. NOTE: This sheet is a summary. It may not cover all possible information. If you have questions about this medicine, talk to your doctor, pharmacist, or health care provider.  2018 Elsevier/Gold Standard (2016-04-04 19:17:21)

## 2017-11-14 NOTE — Telephone Encounter (Signed)
Appts scheduled AVS/Calendar printed per 8/21 los °

## 2017-11-15 LAB — PROSTATE-SPECIFIC AG, SERUM (LABCORP): PROSTATE SPECIFIC AG, SERUM: 49.7 ng/mL — AB (ref 0.0–4.0)

## 2017-11-21 ENCOUNTER — Telehealth: Payer: Self-pay

## 2017-11-21 ENCOUNTER — Telehealth: Payer: Self-pay | Admitting: Hematology

## 2017-11-21 NOTE — Telephone Encounter (Signed)
Attempted to call patient with no answer. Left another voicemail for patient to return call to [phone].

## 2017-11-21 NOTE — Telephone Encounter (Signed)
Received a call from [facility] in [place] stating she has been trying to get in contact with patient to schedule test without success. Stated she has left messages with no return call. Call placed to Danley Danker- patient's friend and verified patient's number is correct. Placed call to patient and left message asking him to return call to [phone].

## 2017-11-21 NOTE — Telephone Encounter (Signed)
Tried to reach regarding voicemail I did leave a message °

## 2017-11-22 ENCOUNTER — Telehealth: Payer: Self-pay

## 2017-11-22 NOTE — Telephone Encounter (Signed)
Placed a call to patient with no answer. Left a message asking patient to return call to [phone] in reference to scheduling follow up appointments and scans.

## 2017-12-04 ENCOUNTER — Encounter: Payer: Self-pay | Admitting: Hematology

## 2017-12-04 NOTE — Progress Notes (Signed)
Patient's sister called to inquire about appointment for CT or Xray. Advised I didn't see anything scheduled and he may want to reach out to the ordering physician. He verbalized understanding.

## 2017-12-12 ENCOUNTER — Telehealth: Payer: Self-pay

## 2017-12-12 ENCOUNTER — Inpatient Hospital Stay: Payer: Medicare Other

## 2017-12-12 ENCOUNTER — Inpatient Hospital Stay: Payer: Medicare Other | Attending: Hematology

## 2017-12-12 VITALS — BP 139/90 | HR 86 | Temp 98.6°F | Resp 18

## 2017-12-12 DIAGNOSIS — C61 Malignant neoplasm of prostate: Secondary | ICD-10-CM

## 2017-12-12 DIAGNOSIS — Z7189 Other specified counseling: Secondary | ICD-10-CM

## 2017-12-12 DIAGNOSIS — C7951 Secondary malignant neoplasm of bone: Secondary | ICD-10-CM

## 2017-12-12 LAB — CBC WITH DIFFERENTIAL (CANCER CENTER ONLY)
BASOS PCT: 0 %
Basophils Absolute: 0 10*3/uL (ref 0.0–0.1)
EOS ABS: 0.1 10*3/uL (ref 0.0–0.5)
EOS PCT: 1 %
HCT: 38.3 % — ABNORMAL LOW (ref 38.4–49.9)
Hemoglobin: 12.4 g/dL — ABNORMAL LOW (ref 13.0–17.1)
LYMPHS ABS: 5.8 10*3/uL — AB (ref 0.9–3.3)
Lymphocytes Relative: 60 %
MCH: 26.9 pg — AB (ref 27.2–33.4)
MCHC: 32.3 g/dL (ref 32.0–36.0)
MCV: 83.4 fL (ref 79.3–98.0)
MONOS PCT: 4 %
Monocytes Absolute: 0.4 10*3/uL (ref 0.1–0.9)
NEUTROS PCT: 35 %
Neutro Abs: 3.4 10*3/uL (ref 1.5–6.5)
PLATELETS: 306 10*3/uL (ref 140–400)
RBC: 4.59 MIL/uL (ref 4.20–5.82)
RDW: 16.5 % — AB (ref 11.0–14.6)
WBC Count: 9.7 10*3/uL (ref 4.0–10.3)

## 2017-12-12 LAB — CMP (CANCER CENTER ONLY)
ALBUMIN: 3.7 g/dL (ref 3.5–5.0)
ALK PHOS: 608 U/L — AB (ref 38–126)
ALT: 7 U/L (ref 0–44)
ANION GAP: 5 (ref 5–15)
AST: 53 U/L — ABNORMAL HIGH (ref 15–41)
BUN: 13 mg/dL (ref 8–23)
CALCIUM: 9.2 mg/dL (ref 8.9–10.3)
CHLORIDE: 105 mmol/L (ref 98–111)
CO2: 28 mmol/L (ref 22–32)
Creatinine: 0.81 mg/dL (ref 0.61–1.24)
GFR, Estimated: >60 mL/min (ref >60)
GLUCOSE: 93 mg/dL (ref 70–99)
Potassium: 4.4 mmol/L (ref 3.5–5.1)
SODIUM: 138 mmol/L (ref 135–145)
Total Bilirubin: 0.4 mg/dL (ref 0.3–1.2)
Total Protein: 7.6 g/dL (ref 6.5–8.1)

## 2017-12-12 MED ORDER — DENOSUMAB 120 MG/1.7ML ~~LOC~~ SOLN
SUBCUTANEOUS | Status: AC
  Filled 2017-12-12: qty 1.7

## 2017-12-12 MED ORDER — DENOSUMAB 120 MG/1.7ML ~~LOC~~ SOLN
120.0000 mg | Freq: Once | SUBCUTANEOUS | Status: AC
  Administered 2017-12-12: 120 mg via SUBCUTANEOUS

## 2017-12-12 NOTE — Telephone Encounter (Signed)
Attempted to call patient again to get scans and follow up appointments scheduled. Patient answered the phone and I informed him that we have had difficulty getting in contact with him. Informed patient that scan and f/u appointments need to be scheduled. Patient hung up. I called patient back and he answered the phone on the 2nd attempt. Informed patient that I was getting ready to call nuclear medicine to get his PET scheduled and asked patient to answer the phone when I called him right back with appointment date and time. Called nuc med to get scan scheduled and tech stated she has tried unsuccessfully numerous times to reach the patient. Appointment scheduled for October 1st at 2:00 with arrival at 1:30. Call placed to patient to let him know and he did not answer the phone. Left a voicemail with appointment day/time, instructions, and asked patient to return call to [phone] to confirm message received.

## 2017-12-12 NOTE — Progress Notes (Signed)
Pt. Tolerated injection well, No further problems or concerns noted. 

## 2017-12-12 NOTE — Patient Instructions (Addendum)

## 2017-12-12 NOTE — Telephone Encounter (Signed)
Patient at the [facility] to receive an injection. Spoke to patient face to face and made him aware of PET scan scheduled for Monday December 25, 2017 at 1:30pm and nothing to eat or drink after midnight and no strenuous exercise 24 hours prior to procedure per tech's instructions. Patient also made aware of follow up appointment with Dr. Irene Limbo on Monday October 7th at 10:40am. Patient verbalized understanding of all appointment days and times.

## 2017-12-14 ENCOUNTER — Telehealth: Payer: Self-pay | Admitting: Hematology

## 2017-12-14 NOTE — Telephone Encounter (Signed)
Appts cancelled patient notified per 9/18 sch msg

## 2017-12-16 DIAGNOSIS — Z87891 Personal history of nicotine dependence: Secondary | ICD-10-CM | POA: Diagnosis not present

## 2017-12-16 DIAGNOSIS — Z859 Personal history of malignant neoplasm, unspecified: Secondary | ICD-10-CM | POA: Diagnosis not present

## 2017-12-16 DIAGNOSIS — S42002A Fracture of unspecified part of left clavicle, initial encounter for closed fracture: Secondary | ICD-10-CM | POA: Diagnosis not present

## 2017-12-18 ENCOUNTER — Ambulatory Visit: Payer: Self-pay

## 2017-12-19 DIAGNOSIS — S42022A Displaced fracture of shaft of left clavicle, initial encounter for closed fracture: Secondary | ICD-10-CM | POA: Diagnosis not present

## 2017-12-19 DIAGNOSIS — M25512 Pain in left shoulder: Secondary | ICD-10-CM | POA: Diagnosis not present

## 2017-12-19 DIAGNOSIS — M75100 Unspecified rotator cuff tear or rupture of unspecified shoulder, not specified as traumatic: Secondary | ICD-10-CM | POA: Diagnosis not present

## 2017-12-25 ENCOUNTER — Encounter (HOSPITAL_COMMUNITY)
Admission: RE | Admit: 2017-12-25 | Discharge: 2017-12-25 | Disposition: A | Discharge status: Home / Self Care | Payer: Medicare Other | Source: Ambulatory Visit | Attending: Hematology | Admitting: Hematology

## 2017-12-25 DIAGNOSIS — C7951 Secondary malignant neoplasm of bone: Secondary | ICD-10-CM | POA: Diagnosis not present

## 2017-12-25 DIAGNOSIS — C61 Malignant neoplasm of prostate: Secondary | ICD-10-CM | POA: Diagnosis not present

## 2017-12-25 MED ORDER — AXUMIN (FLUCICLOVINE F 18) INJECTION
10.3000 | Freq: Once | INTRAVENOUS | Status: AC
  Administered 2017-12-25: 10.3 via INTRAVENOUS

## 2017-12-28 NOTE — Progress Notes (Signed)
HEMATOLOGY/ONCOLOGY CLINIC NOTE  Date of Service: 12/31/17   Patient Care Team: [name], NP as PCP - General (Internal Medicine)  CHIEF COMPLAINTS/PURPOSE OF CONSULTATION:   F/u for continue mx of metastatic prostate cancer  HISTORY OF PRESENTING ILLNESS:  Eric Pocock. is a wonderful 72 y.o. male who has been referred to [place] from the [facility] long hospital ED by [name] PA-C for evaluation and management of metastatic malignancy concerning for metastatic prostate cancer.  Patient has had a h/o locally advanced prostate cancer diagnosed in 2014 and had a radical prostatectomy and pelvic LN dissection by Dr [name] in 10/2012. Pathology showed pT3b pN0 disease (with positive margins, extraprostatic extension, seminal vesicle involvement and angiolymphatic invasion). Bone scan was negative for metastatic disease. Patient report no post-op RT. He notes that he received lupron shots as per his urologist for 1 year post-operatively. He was being monitored by Dr [name] and last had clinical evaluation and PSA about 88months ago - PSA level not available in our system. He notes that he was lost to f/u since then.  Patient presented to the ED with worsening lower and middle back pain for 2 weeks. Also notes about 15-20lbs weight loss and anorexia over the last 6-8weeks. He also notes hematuria. No fevers/chills. Patient had a CT abd/pelvis in the ED which showed Innumerable sclerotic lesions in all imaged bones consistent metastatic disease. The prostate gland has an irregular appearance and the patient may have prostate carcinoma. A few enlarged retroperitoneal lymph nodes are identified worrisome for additional foci of metastatic disease.  Patient's pain was somewhat controlled with prn percocet in ED and he was discharged home with f/u with [place]. Patient notes that the patient wakes him up in the night.  No urinary retention. No loss of bowel or bladder control. No new  neurological symptoms in his extremities.   INTERVAL HISTORY   Eric Helt. is here to follow up of his metastatic prostatic cancer and next Lupron shot. The patient's last visit with [place] was on 11/14/17. The pt reports that he is doing well overall.   He reports associated boil (without injury) to the middle of his back x 3-4 months, left shoulder pain s/p injury, lack of sleep due to pain, and right hip pain. He denies any other symptoms.  Of note since the patient's last visit, pt has had a PET/CT completed on 12/25/17 with results revealing Solitary retroperitoneal lymph node with radiotracer activity adjacent the IVC most consistent with prostate cancer metastasis. 2. Distant nodal metastasis in the LEFT and RIGHT axilla. Not typical location for prostate cancer metastasis but the activity is intense and favor such. 3. New periosteal reaction within the RIGHT iliac bone and LEFT scapula with intense radiotracer activity consistent active skeletal metastasis. Additional active metastasis in the RIGHT mandible and proximal RIGHT femur.   MEDICAL HISTORY:   Past Medical History:  Diagnosis Date  . Arthritis    left knee, left shoulder  . Erectile dysfunction 06/25/2012  . Foley catheter in place   . Goiter 06/25/2012  . H/O hiatal hernia   . Hematuria    occasional  . Hemorrhoid   . History of bladder infections   . Joint pain     SURGICAL HISTORY: Past Surgical History:  Procedure Laterality Date  . INGUINAL HERNIA REPAIR  8 yrs ago  . LYMPHADENECTOMY Bilateral 11/20/2012   Procedure: WITH BILATERAL PELVIC LYMPH NODE DISSECTION ;  Surgeon: Camelia Eng  [name], MD;  Location: [facility];  Service: Urology;  Laterality: Bilateral;  . ROBOT ASSISTED LAPAROSCOPIC RADICAL PROSTATECTOMY N/A 11/20/2012   Procedure: ROBOTIC ASSISTED LAPAROSCOPIC RADICAL PROSTATECTOMY;  Surgeon: [name], MD;  Location: [facility];  Service: Urology;  Laterality: N/A;    SOCIAL HISTORY: Social History    Socioeconomic History  . Marital status: Divorced    Spouse name: Not on file  . Number of children: Not on file  . Years of education: 25  . Highest education level: Not on file  Occupational History  . Occupation: Retired    Comment: Education officer, community  . Financial resource strain: Not on file  . Food insecurity:    Worry: Not on file    Inability: Not on file  . Transportation needs:    Medical: Not on file    Non-medical: Not on file  Tobacco Use  . Smoking status: Former Smoker    Packs/day: 0.25    Years: 25.00    Pack years: 6.25    Types: Cigarettes  . Smokeless tobacco: Former Systems developer  . Tobacco comment: quit date 2016  Substance and Sexual Activity  . Alcohol use: Not Currently    Comment: occasional beer   . Drug use: Yes    Types: Marijuana    Comment: 3 x a month uses marijuana  . Sexual activity: Yes  Lifestyle  . Physical activity:    Days per week: Not on file    Minutes per session: Not on file  . Stress: Not on file  Relationships  . Social connections:    Talks on phone: Not on file    Gets together: Not on file    Attends religious service: Not on file    Active member of club or organization: Not on file    Attends meetings of clubs or organizations: Not on file    Relationship status: Not on file  . Intimate partner violence:    Fear of current or ex partner: Not on file    Emotionally abused: Not on file    Physically abused: Not on file    Forced sexual activity: Not on file  Other Topics Concern  . Not on file  Social History Narrative   Regular exercise-no   Caffeine Use-yes    FAMILY HISTORY: Family History  Problem Relation Age of Onset  . Heart disease Sister   . Stroke Sister   . Colon cancer Paternal Uncle   . Diabetes Neg Hx     ALLERGIES:  is allergic to heparin and poison ivy extract [poison ivy extract].  MEDICATIONS:  Current Outpatient Medications  Medication Sig Dispense Refill  . denosumab (XGEVA) 120  MG/1.7ML SOLN injection Inject 120 mg into the skin once.    [name] leuprolide (LUPRON) 22.5 MG injection Inject 22.5 mg into the muscle every 3 (three) months.    [name] oxyCODONE-acetaminophen (PERCOCET) 10-325 MG tablet Take 1 tablet by mouth every 4 (four) hours as needed for pain. 90 tablet 0  . Vitamin D, Ergocalciferol, (DRISDOL) 50000 units CAPS capsule Take 1 capsule (50,000 Units total) by mouth 2 (two) times a week. 24 capsule 0   No current facility-administered medications for this visit.     REVIEW OF SYSTEMS:    A 10+ POINT REVIEW OF SYSTEMS WAS OBTAINED including neurology, dermatology, psychiatry, cardiac, respiratory, lymph, extremities, GI, GU, Musculoskeletal, constitutional, breasts, reproductive, HEENT.  All pertinent positives are noted in the HPI.  All others are negative.  PHYSICAL EXAMINATION: ECOG PERFORMANCE STATUS: 2 - Symptomatic, <50% confined to bed  . Vitals:   12/31/17 1042  BP: (!) 143/91  Pulse: 84  Resp: 14  Temp: 98 F (36.7 C)  SpO2: 99%   Filed Weights   12/31/17 1042  Weight: 192 lb 14.4 oz (87.5 kg)   .Body mass index is 25.45 kg/m. . Wt Readings from Last 3 Encounters:  12/31/17 192 lb 14.4 oz (87.5 kg)  11/14/17 199 lb 6.4 oz (90.4 kg)  08/14/17 209 lb 4.8 oz (94.9 kg)   GENERAL:alert, in no acute distress and comfortable SKIN: no acute rashes, no significant lesions. Sebaceous cyst may be secondary infection. Able to squeeze out 3 cc of whitish-yellowish material.  EYES: conjunctiva are pink and non-injected, sclera anicteric OROPHARYNX: MMM, no exudates, no oropharyngeal erythema or ulceration NECK: supple, no JVD LYMPH:  no palpable lymphadenopathy in the cervical, axillary or inguinal regions LUNGS: clear to auscultation b/l with normal respiratory effort HEART: regular rate & rhythm ABDOMEN:  normoactive bowel sounds , non tender, not distended. No palpable hepatosplenomegaly.  Extremity: no pedal edema. TTP of the superior aspect  of the shoulder with significantly limited ROM of left shoulder.  PSYCH: alert & oriented x 3 with fluent speech NEURO: no focal motor/sensory deficits   LABORATORY DATA:  I have reviewed the data as listed  . CBC Latest Ref Rng & Units 12/12/2017 11/14/2017 08/14/2017  WBC 4.0 - 10.3 K/uL 9.7 11.8(H) 9.6  Hemoglobin 13.0 - 17.1 g/dL 12.4(L) 11.9(L) 12.1(L)  Hematocrit 38.4 - 49.9 % 38.3(L) 37.7(L) 38.8  Platelets 140 - 400 K/uL 306 320 230    CMP Latest Ref Rng & Units 12/12/2017 11/14/2017 08/14/2017  Glucose 70 - 99 mg/dL 93 92 91  BUN 8 - 23 mg/dL 13 15 15   Creatinine 0.61 - 1.24 mg/dL 0.81 0.75 0.86  Sodium 135 - 145 mmol/L 138 137 138  Potassium 3.5 - 5.1 mmol/L 4.4 4.4 4.3  Chloride 98 - 111 mmol/L 105 101 103  CO2 22 - 32 mmol/L 28 28 27   Calcium 8.9 - 10.3 mg/dL 9.2 9.0 10.0  Total Protein 6.5 - 8.1 g/dL 7.6 7.7 7.5  Total Bilirubin 0.3 - 1.2 mg/dL 0.4 <0.2(L) 0.3  Alkaline Phos 38 - 126 U/L 608(H) 625(H) 235(H)  AST 15 - 41 U/L 53(H) 48(H) 44(H)  ALT 0 - 44 U/L 7 12 14               RADIOGRAPHIC STUDIES: I have personally reviewed the radiological images as listed and agreed with the findings in the report. Nm Pet (axumin) Skull Base To Mid Thigh  Result Date: 12/26/2017 CLINICAL DATA:  Prostate carcinoma with biochemical recurrence. Increasing PSA and back pain. On Lupron. Skeletal metastasis EXAM: NUCLEAR MEDICINE PET SKULL BASE TO THIGH TECHNIQUE: 10.3 mCi F-18 Fluciclovine was injected intravenously. Full-ring PET imaging was performed from the skull base to thigh after the radiotracer. CT data was obtained and used for attenuation correction and anatomic localization. COMPARISON:  Three bone scan 05/12/2016, 05/03/2016 FINDINGS: NECK No radiotracer activity in neck lymph nodes. Incidental CT finding: None CHEST Intense radiotracer activity within bilateral axial lymph nodes. For example deep LEFT axial lymph node with SUV max equal 8.2 measures 11 mm (image 72/4).  Similar small but intensely radiotracer avid lymph node in the RIGHT axilla measures 7 mm (image 64/4) with SUV max equal 8.9. Abnormal lymph nodes extend to the sub pectoralis nodal station on the RIGHT. No supraclavicular hypermetabolic  lymph nodes. no suspicious pulmonary nodules on the CT scan. Incidental CT finding: Markedly enlarged LEFT lobe of the thyroid gland with central calcification measures 5 cm. ABDOMEN/PELVIS Prostate: No focal activity in the prostate bed. Lymph nodes: Minimally enlarged retroperitoneal lymph node adjacent to the distal IVC has intense radiotracer activity with SUV max equal 7.3. This lymph node measures 11 mm positioned between the IVC and RIGHT psoas muscle (image 155/4). No abnormal lymph nodes evident in the pelvis. Liver: No evidence of liver metastasis Incidental CT finding: None SKELETON There is intense radiotracer activity associated with the densely sclerotic lesions within the RIGHT hemipelvis and LEFT scapula. There is robust periosteal reaction associated with the bone at this level (image 178/4) which is new from prior. Example activity in the RIGHT iliac wing with SUV max equal 6.4. Activity in the expansile lesion in the LEFT scapula with SUV max equal 7.5. Additional lesions the proximal RIGHT femur and RIGHT mandible. IMPRESSION: 1. Solitary retroperitoneal lymph node with radiotracer activity adjacent the IVC most consistent with prostate cancer metastasis. 2. Distant nodal metastasis in the LEFT and RIGHT axilla. Not typical location for prostate cancer metastasis but the activity is intense and favor such. 3. New periosteal reaction within the RIGHT iliac bone and LEFT scapula with intense radiotracer activity consistent active skeletal metastasis. Additional active metastasis in the RIGHT mandible and proximal RIGHT femur. Electronically Signed   By: Suzy Bouchard M.D.   On: 12/26/2017 09:03    ASSESSMENT & PLAN:   72 y.o. AAM with previous h/o of  locally advanced pT3b,pN0 prostate cancer now with concern for newly noted metastatic prostate cancer.  1) Metastatic Prostate Cancer  PSA level 550 pre-treatment and have improved significantly and were down to 0.6. Now gradually rising again and if upt o  49.7  without any other associated new symptoms. Patient had been noted to have extensive osseous metastatic disease throughout the spine and bilaterally in the calvarium as well. Also noted to have local recurrence in the prostatic bed, retroperitoneal Lnadenoapathy and some mediastinal LNadenoapathy.  ECOG PS 1 Previously locally advanced pT3b,pN0 diagnosed in 10/2012 s/p radical prostatectomy and ADT x 1 yr. Testosterone level 7 -- is dropping appropriately with treatment. S/p 6 cycles of taxotere.  -Discussed the 12/25/17 PET/CT which revealed Solitary retroperitoneal lymph node with radiotracer activity adjacent the IVC most consistent with prostate cancer metastasis. 2. Distant nodal metastasis in the LEFT and RIGHT axilla. Not typical location for prostate cancer metastasis but the activity is intense and favor such. 3. New periosteal reaction within the RIGHT iliac bone and LEFT scapula with intense radiotracer activity consistent active skeletal metastasis. Additional active metastasis in the RIGHT mandible and proximal RIGHT femur. -Due to the recent PET scan and newfound active areas, and prior elevated PSA, this is concerning for prostate cancer recurrence.  -Discussed with the patient that we will continue with Xgeva, Lupron, and add on enzalutamide once daily to aid in controlling the disease. -Also discussed that if the pain appears, then we may consider palliative radiation therapy to aid with pain control.   2) Anterior mandibular pain  Orthopantogram done - showed IMPRESSION: Residual teeth 23-26 with decay.  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  3) Elevated alkaline phosphatase level due to extensive bone mets.   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 Stable. Hg at 12.1 today (08/14/17) -would anticipate mild anemia from ADT  5) Hematuria due to local recurrence of prostate cancer - 1 episode of hematuria - now resolved  6) Anorexia with weight loss of about 15-20lbs. Notes improved po intake with Marinol. Patient notes he's been eating much better. . Wt Readings from Last 3 Encounters:  12/31/17 192 lb 14.4 oz (87.5 kg)  11/14/17 199 lb 6.4 oz (90.4 kg)  08/14/17 209 lb 4.8 oz (94.9 kg)    7) Vit D deficiency with hypocalcemia -- levels are normalizing. -Being aggressively replaced since his calcium levels have dropped after Xgeva likely reflecting significant bone calcium deficits from extensive healing bone lesions. -Continue ergocalciferol 50,000U weekly  -Continue Vit D daily    8) Neoplasm related pain - much improved patient is has not needed much of his prescribed pain medications Plan  -Continue Percocet when necessary for neoplasm related bone pains, refilled today  -Senna S for bowel prophylaxis  9) Colonic thickening in transverse colon thought to be related to spasm/incomplete distension of colon in this area. -would recommend pursuing colonoscopy with PCP --pending  10) Left shoulder pain Patient has left shoulder pain s/p injury 3-4 months ago.  -Will refer the patient to an orthopedist for further evaluation -Will order a left shoulder xray to be completed in the near future to rule out a possible fracture to left shoulder  -XR left shoulder today- reviewed results - No fracture or dislocation is noted in the left shoulder. Sclerosis of scapula is noted consistent with metastatic  disease. -Referral to orthopedics for left shoulder pain and limited ROM   11) Sebaceous cyst to posterior mid upper back - infected sebaceous- expressed purulent secretions from cys- will treat with bactrim and if persistent discomfort will need surgical referral for consideration of drainage of cyst.    Flu today Continue Xgeva q4 weeks Continue Lupron q12 weeks XR left shoulder today Referral to orthopedics for left shoulder pain and limited ROM RTC with Dr Irene Limbo in 4 weeks with labs (11/5)  All of the patients questions were answered with apparent satisfaction. The patient knows to call the clinic with any problems, questions or concerns.   The total time spent in the appt was 25 minutes and more than 50% was on counseling and direct patient cares.  Sullivan Lone MD MS AAHIVMS [facility] Hematology/Oncology Physician [facility]  (Office):       [phone] (Work cell):  [phone] (Fax):           [phone]  I, Soijett Blue am acting as scribe for Dr. Sullivan Lone.  .I have reviewed the above documentation for accuracy and completeness, and I agree with the above. [name] MD

## 2017-12-31 ENCOUNTER — Ambulatory Visit (HOSPITAL_COMMUNITY)
Admission: RE | Admit: 2017-12-31 | Discharge: 2017-12-31 | Disposition: A | Discharge status: Home / Self Care | Payer: Medicare Other | Source: Ambulatory Visit | Attending: Hematology | Admitting: Hematology

## 2017-12-31 ENCOUNTER — Other Ambulatory Visit: Payer: Self-pay | Admitting: *Deleted

## 2017-12-31 ENCOUNTER — Telehealth: Payer: Self-pay | Admitting: *Deleted

## 2017-12-31 ENCOUNTER — Encounter: Payer: Self-pay | Admitting: Hematology

## 2017-12-31 ENCOUNTER — Telehealth: Payer: Self-pay | Admitting: Pharmacist

## 2017-12-31 ENCOUNTER — Inpatient Hospital Stay: Payer: Medicare Other | Attending: Hematology | Admitting: Hematology

## 2017-12-31 VITALS — BP 143/91 | HR 84 | Temp 98.0°F | Resp 14 | Ht 73.0 in | Wt 192.9 lb

## 2017-12-31 DIAGNOSIS — M25512 Pain in left shoulder: Secondary | ICD-10-CM

## 2017-12-31 DIAGNOSIS — C61 Malignant neoplasm of prostate: Secondary | ICD-10-CM | POA: Insufficient documentation

## 2017-12-31 DIAGNOSIS — M899 Disorder of bone, unspecified: Secondary | ICD-10-CM | POA: Diagnosis not present

## 2017-12-31 DIAGNOSIS — D6481 Anemia due to antineoplastic chemotherapy: Secondary | ICD-10-CM | POA: Diagnosis not present

## 2017-12-31 DIAGNOSIS — L723 Sebaceous cyst: Secondary | ICD-10-CM | POA: Diagnosis not present

## 2017-12-31 DIAGNOSIS — C778 Secondary and unspecified malignant neoplasm of lymph nodes of multiple regions: Secondary | ICD-10-CM

## 2017-12-31 DIAGNOSIS — R630 Anorexia: Secondary | ICD-10-CM | POA: Diagnosis not present

## 2017-12-31 DIAGNOSIS — D630 Anemia in neoplastic disease: Secondary | ICD-10-CM

## 2017-12-31 DIAGNOSIS — Z23 Encounter for immunization: Secondary | ICD-10-CM | POA: Diagnosis not present

## 2017-12-31 DIAGNOSIS — E559 Vitamin D deficiency, unspecified: Secondary | ICD-10-CM

## 2017-12-31 DIAGNOSIS — R634 Abnormal weight loss: Secondary | ICD-10-CM | POA: Diagnosis not present

## 2017-12-31 DIAGNOSIS — M898X9 Other specified disorders of bone, unspecified site: Secondary | ICD-10-CM

## 2017-12-31 DIAGNOSIS — S4992XA Unspecified injury of left shoulder and upper arm, initial encounter: Secondary | ICD-10-CM | POA: Diagnosis not present

## 2017-12-31 DIAGNOSIS — C7951 Secondary malignant neoplasm of bone: Secondary | ICD-10-CM

## 2017-12-31 DIAGNOSIS — G893 Neoplasm related pain (acute) (chronic): Secondary | ICD-10-CM | POA: Insufficient documentation

## 2017-12-31 DIAGNOSIS — T451X5A Adverse effect of antineoplastic and immunosuppressive drugs, initial encounter: Secondary | ICD-10-CM

## 2017-12-31 MED ORDER — ENZALUTAMIDE 40 MG PO CAPS: 160.0000 mg | ORAL_CAPSULE | Freq: Every day | ORAL | 1 refills | Status: DC

## 2017-12-31 MED ORDER — VITAMIN D (ERGOCALCIFEROL) 1.25 MG (50000 UNIT) PO CAPS: 50000.0000 [IU] | ORAL_CAPSULE | ORAL | 0 refills | Status: DC

## 2017-12-31 MED ORDER — INFLUENZA VAC SPLIT QUAD 0.5 ML IM SUSY
PREFILLED_SYRINGE | INTRAMUSCULAR | Status: AC
  Filled 2017-12-31: qty 0.5

## 2017-12-31 MED ORDER — SULFAMETHOXAZOLE-TRIMETHOPRIM 800-160 MG PO TABS: 1.0000 | ORAL_TABLET | Freq: Two times a day (BID) | ORAL | 0 refills | Status: DC

## 2017-12-31 MED ORDER — INFLUENZA VAC SPLIT QUAD 0.5 ML IM SUSY
0.5000 mL | PREFILLED_SYRINGE | Freq: Once | INTRAMUSCULAR | Status: AC
  Administered 2017-12-31: 0.5 mL via INTRAMUSCULAR

## 2017-12-31 MED ORDER — OXYCODONE-ACETAMINOPHEN 10-325 MG PO TABS: 1.0000 | ORAL_TABLET | ORAL | 0 refills | Status: DC | PRN

## 2017-12-31 MED ORDER — INFLUENZA VAC SPLIT HIGH-DOSE 0.5 ML IM SUSY
0.5000 mL | PREFILLED_SYRINGE | Freq: Once | INTRAMUSCULAR | Status: DC
  Filled 2017-12-31: qty 0.5

## 2017-12-31 NOTE — Telephone Encounter (Signed)
Oral Oncology Pharmacist Encounter  Received new prescription for Xtandi (enzalutamide) for the treatment of metastatic, castration resistant prostate cancer in conjunction with androgen deprivation therapy (Lupron injections), planned duration until disease progression or unacceptable toxicity.  Labs from 12/12/2017 assessed, okay for treatment.  Current medication list in Epic reviewed, DDIs with Xtandi identified:  Xtandi and Bactrim: Category D interaction: Gillermina Phy is an inducer of CYP2C9 leading to possible increased metabolism and decrease systemic exposure to Bactrim.  Bactrim prescribed for 7-day course and will likely be completed prior to initiation of Xtandi.  No change to current therapy indicated at this time.  Gillermina Phy and Percocet: Category D interaction: Gillermina Phy is an inducer of CYP3A4 leading to possible increased metabolism and decrease systemic exposure to oxycodone portion of Percocet.  Patient will be counseled to monitor for increase in pain and need for increased administration of Percocet.  No change to current therapy indicated at this time.  Prescription has been e-scribed to the [facility] for benefits analysis and approval.  Oral Oncology Clinic will continue to follow for insurance authorization, copayment issues, initial counseling and start date.  Johny Drilling, PharmD, BCPS, BCOP  12/31/2017 3:39 PM Oral Oncology Clinic [phone]

## 2017-12-31 NOTE — Telephone Encounter (Signed)
Pt to receive flu vaccine today. No lab appt. Scheduled lab appt for 1100 on 01/29/18 prior to injection. Calendar printed and given to pt. Received paper script for xtandi to be added to pt regimen. Given to [place], [facility] Pt made aware of all changes and to be expecting a call from pharmacy regarding xtandi shipping.

## 2018-01-01 ENCOUNTER — Telehealth: Payer: Self-pay

## 2018-01-01 NOTE — Telephone Encounter (Signed)
Oral Oncology Patient Advocate Encounter  Prior Authorization for Eric Osborn has been approved.    PA# [id number] Effective dates: 01/01/18 through 06/30/18  Oral Oncology Clinic will continue to follow.   Eric Osborn Patient [place] Phone [phone] Fax [phone]

## 2018-01-01 NOTE — Telephone Encounter (Signed)
Oral Oncology Patient Advocate Encounter  Received notification from [facility] that prior authorization for Gillermina Phy is required.  PA submitted on CoverMyMeds Key [id number] Status is pending  Oral Oncology Clinic will continue to follow.  [place] Patient [place] Phone [phone] Fax [phone]

## 2018-01-01 NOTE — Telephone Encounter (Signed)
Oral Chemotherapy Pharmacist Encounter   I spoke with patient for overview of: Xtandi (enzalutamide) for the treatment of metastatic, castration resistant prostate cancer in conjunction with androgen deprivation therapy (Lupron injections), planned duration until disease progression or unacceptable toxicity.   Counseled patient on administration, dosing, side effects, monitoring, drug-food interactions, safe handling, storage, and disposal.  Patient will take Xtandi 40mg  capsules, 4 capsules (160mg ) by mouth once daily without regard to food.  Xtandi start date: 01/05/18 Patient will wait until Saturday 01/05/18 to start Xtandi due to interaction with Bactrim.  Adverse effects include but are not limited to: peripheral edema, GI upset, hypertension, hot flashes, fatigue, falls/fractures, and arthralgias.   Patient instructed about small risk of seizures with Xtandi treatment.  Reviewed with patient importance of keeping a medication schedule and plan for any missed doses.  [name] voiced understanding and appreciation.   All questions answered. Medication reconciliation performed and medication/allergy list updated.  Patient will pick up his first fill of Xtandi from the [facility] tomorrow (01/02/2018). Co-pay for 30-day supply is $8.50  Patient understands to wait until this coming Saturday prior to starting Xtandi to allow maximum benefit from the Bactrim due to previously documented medication interaction between [place] and Bactrim.  Office visits in November confirm with patient.  Patient knows to call the office with questions or concerns.  Oral Oncology Clinic will continue to follow.  Johny Drilling, PharmD, BCPS, BCOP  01/01/2018   10:37 AM Oral Oncology Clinic [phone]

## 2018-01-02 ENCOUNTER — Telehealth: Payer: Self-pay

## 2018-01-02 NOTE — Telephone Encounter (Signed)
Faxed and added referrals to proficient. Per 10/7 los

## 2018-01-03 NOTE — Telephone Encounter (Signed)
Oral Oncology Patient Advocate Encounter  Confirmed with [place] that Gillermina Phy was picked up on 01/03/18   [place] Patient [place] Phone [phone] Fax [phone]

## 2018-01-09 ENCOUNTER — Ambulatory Visit: Payer: Self-pay

## 2018-01-24 ENCOUNTER — Telehealth: Payer: Self-pay | Admitting: Hematology

## 2018-01-24 NOTE — Telephone Encounter (Signed)
Called pt re appts being moved due to [place] being out of town. Left voicemail with new appt time and date.

## 2018-01-28 ENCOUNTER — Other Ambulatory Visit: Payer: Self-pay | Admitting: Hematology

## 2018-01-28 ENCOUNTER — Other Ambulatory Visit: Payer: Self-pay | Admitting: Medical

## 2018-01-28 DIAGNOSIS — C7951 Secondary malignant neoplasm of bone: Secondary | ICD-10-CM

## 2018-01-28 MED ORDER — OXYCODONE-ACETAMINOPHEN 10-325 MG PO TABS: 1.0000 | ORAL_TABLET | ORAL | 0 refills | Status: DC | PRN

## 2018-01-29 ENCOUNTER — Ambulatory Visit: Payer: Self-pay | Admitting: Hematology

## 2018-01-29 ENCOUNTER — Other Ambulatory Visit: Payer: Self-pay

## 2018-01-29 ENCOUNTER — Inpatient Hospital Stay: Payer: Medicare Other | Attending: Hematology

## 2018-01-29 ENCOUNTER — Ambulatory Visit: Payer: Self-pay

## 2018-01-29 ENCOUNTER — Inpatient Hospital Stay: Payer: Medicare Other

## 2018-01-29 VITALS — BP 142/89 | HR 80 | Temp 98.1°F | Resp 18

## 2018-01-29 DIAGNOSIS — C7951 Secondary malignant neoplasm of bone: Secondary | ICD-10-CM | POA: Diagnosis not present

## 2018-01-29 DIAGNOSIS — Z5111 Encounter for antineoplastic chemotherapy: Secondary | ICD-10-CM | POA: Diagnosis not present

## 2018-01-29 DIAGNOSIS — C61 Malignant neoplasm of prostate: Secondary | ICD-10-CM

## 2018-01-29 DIAGNOSIS — G893 Neoplasm related pain (acute) (chronic): Secondary | ICD-10-CM

## 2018-01-29 DIAGNOSIS — Z7189 Other specified counseling: Secondary | ICD-10-CM

## 2018-01-29 DIAGNOSIS — E559 Vitamin D deficiency, unspecified: Secondary | ICD-10-CM

## 2018-01-29 LAB — CBC WITH DIFFERENTIAL/PLATELET
Abs Immature Granulocytes: 0.03 10*3/uL (ref 0.00–0.07)
BASOS ABS: 0 10*3/uL (ref 0.0–0.1)
Basophils Relative: 0 %
EOS PCT: 1 %
Eosinophils Absolute: 0.1 10*3/uL (ref 0.0–0.5)
HCT: 38.2 % — ABNORMAL LOW (ref 39.0–52.0)
HEMOGLOBIN: 11.4 g/dL — AB (ref 13.0–17.0)
IMMATURE GRANULOCYTES: 0 %
Lymphocytes Relative: 71 %
Lymphs Abs: 7.4 10*3/uL — ABNORMAL HIGH (ref 0.7–4.0)
MCH: 27.1 pg (ref 26.0–34.0)
MCHC: 29.8 g/dL — ABNORMAL LOW (ref 30.0–36.0)
MCV: 91 fL (ref 80.0–100.0)
MONO ABS: 0.5 10*3/uL (ref 0.1–1.0)
Monocytes Relative: 5 %
NEUTROS ABS: 2.4 10*3/uL (ref 1.7–7.7)
Neutrophils Relative %: 23 %
PLATELETS: 240 10*3/uL (ref 150–400)
RBC: 4.2 MIL/uL — AB (ref 4.22–5.81)
RDW: 18.9 % — ABNORMAL HIGH (ref 11.5–15.5)
WBC: 10.6 10*3/uL — AB (ref 4.0–10.5)
nRBC: 0 % (ref 0.0–0.2)

## 2018-01-29 LAB — CMP (CANCER CENTER ONLY)
ALK PHOS: 810 U/L — AB (ref 38–126)
ALT: 8 U/L (ref 0–44)
ANION GAP: 4 — AB (ref 5–15)
AST: 28 U/L (ref 15–41)
Albumin: 3.5 g/dL (ref 3.5–5.0)
BUN: 9 mg/dL (ref 8–23)
CALCIUM: 9.1 mg/dL (ref 8.9–10.3)
CO2: 27 mmol/L (ref 22–32)
Chloride: 108 mmol/L (ref 98–111)
Creatinine: 0.68 mg/dL (ref 0.61–1.24)
GFR, Estimated: >60 mL/min (ref >60)
Glucose, Bld: 90 mg/dL (ref 70–99)
POTASSIUM: 4.1 mmol/L (ref 3.5–5.1)
SODIUM: 139 mmol/L (ref 135–145)
TOTAL PROTEIN: 7.1 g/dL (ref 6.5–8.1)
Total Bilirubin: 0.3 mg/dL (ref 0.3–1.2)

## 2018-01-29 MED ORDER — LEUPROLIDE ACETATE (3 MONTH) 22.5 MG IM KIT
PACK | INTRAMUSCULAR | Status: AC
  Filled 2018-01-29: qty 22.5

## 2018-01-29 MED ORDER — DENOSUMAB 120 MG/1.7ML ~~LOC~~ SOLN
SUBCUTANEOUS | Status: AC
  Filled 2018-01-29: qty 1.7

## 2018-01-29 MED ORDER — DENOSUMAB 120 MG/1.7ML ~~LOC~~ SOLN
120.0000 mg | Freq: Once | SUBCUTANEOUS | Status: AC
  Administered 2018-01-29: 120 mg via SUBCUTANEOUS

## 2018-01-29 MED ORDER — LEUPROLIDE ACETATE (3 MONTH) 22.5 MG IM KIT
22.5000 mg | PACK | Freq: Once | INTRAMUSCULAR | Status: AC
  Administered 2018-01-29: 22.5 mg via INTRAMUSCULAR

## 2018-01-30 LAB — PROSTATE-SPECIFIC AG, SERUM (LABCORP): PROSTATE SPECIFIC AG, SERUM: 3.6 ng/mL (ref 0.0–4.0)

## 2018-01-30 LAB — VITAMIN D 25 HYDROXY (VIT D DEFICIENCY, FRACTURES): VIT D 25 HYDROXY: 19.8 ng/mL — AB (ref 30.0–100.0)

## 2018-02-06 ENCOUNTER — Ambulatory Visit: Payer: Self-pay

## 2018-02-11 ENCOUNTER — Telehealth: Payer: Self-pay | Admitting: Hematology

## 2018-02-11 NOTE — Progress Notes (Signed)
HEMATOLOGY/ONCOLOGY CLINIC NOTE  Date of Service: 02/12/18   Patient Care Team: [name], NP as PCP - General (Internal Medicine)  CHIEF COMPLAINTS/PURPOSE OF CONSULTATION:   F/u for continue mx of metastatic prostate cancer  HISTORY OF PRESENTING ILLNESS:  Eric Shein. is a wonderful 72 y.o. male who has been referred to [place] from the [facility] long hospital ED by [name] PA-C for evaluation and management of metastatic malignancy concerning for metastatic prostate cancer.  Patient has had a h/o locally advanced prostate cancer diagnosed in 2014 and had a radical prostatectomy and pelvic LN dissection by Dr [name] in 10/2012. Pathology showed pT3b pN0 disease (with positive margins, extraprostatic extension, seminal vesicle involvement and angiolymphatic invasion). Bone scan was negative for metastatic disease. Patient report no post-op RT. He notes that he received lupron shots as per his urologist for 1 year post-operatively. He was being monitored by Dr [name] and last had clinical evaluation and PSA about 42month ago - PSA level not available in our system. He notes that he was lost to f/u since then.  Patient presented to the ED with worsening lower and middle back pain for 2 weeks. Also notes about 15-20lbs weight loss and anorexia over the last 6-8weeks. He also notes hematuria. No fevers/chills. Patient had a CT abd/pelvis in the ED which showed Innumerable sclerotic lesions in all imaged bones consistent metastatic disease. The prostate gland has an irregular appearance and the patient may have prostate carcinoma. A few enlarged retroperitoneal lymph nodes are identified worrisome for additional foci of metastatic disease.  Patient's pain was somewhat controlled with prn percocet in ED and he was discharged home with f/u with [place] Patient notes that the patient wakes him up in the night.  No urinary retention. No loss of bowel or bladder control. No new  neurological symptoms in his extremities.   INTERVAL HISTORY   WJerson Osborn is here to follow up of his metastatic prostatic cancer and next Lupron shot. The patient's last visit with [place] on 12/31/17. The pt reports that he is doing well overall.   The pt reports that he has not had any difficulty taking his Enzalutamide. He notes that his back pain has improved significantly in the interim as well. He is needing to use pain medications about every other day at this time. The patient also notes that his previous leg pain has not returned. He denies any pian in the hips, legs or abdomen.   The pt notes that he urinates about twice a night at this time.   The pt notes that he has not been able to take his Vitamin D replacement in the interim, as he ran out and did not seek a refill.   Lab results (01/29/18) of CBC w/diff, CMP is as follows: all values are WNL except for WBC at 10.6k, RBC at 4.20, HGB at 11.4, HCT at 38.2, MCHC at 29.8, RDW at 18.9, Lymphs abs at 7.4k, Alk Phos at 810 01/29/18 Vitamin D at 19.8 01/29/18 PSA at 3.6 02/12/18 PSA is pending  On review of systems, pt reports improved back pain, stable energy levels, eating well, and denies abdominal pain, leg pain, hip pain, back pain, thigh pain, new bone pains, and any other symptoms.    MEDICAL HISTORY:   Past Medical History:  Diagnosis Date  . Arthritis    left knee, left shoulder  . Erectile dysfunction 06/25/2012  . Foley catheter in place   .  Goiter 06/25/2012  . H/O hiatal hernia   . Hematuria    occasional  . Hemorrhoid   . History of bladder infections   . Joint pain     SURGICAL HISTORY: Past Surgical History:  Procedure Laterality Date  . INGUINAL HERNIA REPAIR  8 yrs ago  . LYMPHADENECTOMY Bilateral 11/20/2012   Procedure: WITH BILATERAL PELVIC LYMPH NODE DISSECTION ;  Surgeon: [name], MD;  Location: [facility];  Service: Urology;  Laterality: Bilateral;  . ROBOT ASSISTED LAPAROSCOPIC RADICAL  PROSTATECTOMY N/A 11/20/2012   Procedure: ROBOTIC ASSISTED LAPAROSCOPIC RADICAL PROSTATECTOMY;  Surgeon: [name], MD;  Location: [facility];  Service: Urology;  Laterality: N/A;    SOCIAL HISTORY: Social History   Socioeconomic History  . Marital status: Divorced    Spouse name: Not on file  . Number of children: Not on file  . Years of education: 53  . Highest education level: Not on file  Occupational History  . Occupation: Retired    Comment: Education officer, community  . Financial resource strain: Not on file  . Food insecurity:    Worry: Not on file    Inability: Not on file  . Transportation needs:    Medical: Not on file    Non-medical: Not on file  Tobacco Use  . Smoking status: Former Smoker    Packs/day: 0.25    Years: 25.00    Pack years: 6.25    Types: Cigarettes  . Smokeless tobacco: Former Systems developer  . Tobacco comment: quit date 2016  Substance and Sexual Activity  . Alcohol use: Not Currently    Comment: occasional beer   . Drug use: Yes    Types: Marijuana    Comment: 3 x a month uses marijuana  . Sexual activity: Yes  Lifestyle  . Physical activity:    Days per week: Not on file    Minutes per session: Not on file  . Stress: Not on file  Relationships  . Social connections:    Talks on phone: Not on file    Gets together: Not on file    Attends religious service: Not on file    Active member of club or organization: Not on file    Attends meetings of clubs or organizations: Not on file    Relationship status: Not on file  . Intimate partner violence:    Fear of current or ex partner: Not on file    Emotionally abused: Not on file    Physically abused: Not on file    Forced sexual activity: Not on file  Other Topics Concern  . Not on file  Social History Narrative   Regular exercise-no   Caffeine Use-yes    FAMILY HISTORY: Family History  Problem Relation Age of Onset  . Heart disease Sister   . Stroke Sister   . Colon cancer Paternal Uncle    . Diabetes Neg Hx     ALLERGIES:  is allergic to heparin and poison ivy extract [poison ivy extract].  MEDICATIONS:  Current Outpatient Medications  Medication Sig Dispense Refill  . denosumab (XGEVA) 120 MG/1.7ML SOLN injection Inject 120 mg into the skin once.    . enzalutamide (XTANDI) 40 MG capsule Take 4 capsules (160 mg total) by mouth daily. 120 capsule 1  . leuprolide (LUPRON) 22.5 MG injection Inject 22.5 mg into the muscle every 3 (three) months.    [name] oxyCODONE-acetaminophen (PERCOCET) 10-325 MG tablet Take 1 tablet by mouth every 4 (four)  hours as needed for pain. 90 tablet 0  . sulfamethoxazole-trimethoprim (BACTRIM DS,SEPTRA DS) 800-160 MG tablet Take 1 tablet by mouth 2 (two) times daily. 14 tablet 0  . [START ON 02/14/2018] Vitamin D, Ergocalciferol, (DRISDOL) 1.25 MG (50000 UT) CAPS capsule Take 1 capsule (50,000 Units total) by mouth 2 (two) times a week. 24 capsule 6   No current facility-administered medications for this visit.     REVIEW OF SYSTEMS:    A 10+ POINT REVIEW OF SYSTEMS WAS OBTAINED including neurology, dermatology, psychiatry, cardiac, respiratory, lymph, extremities, GI, GU, Musculoskeletal, constitutional, breasts, reproductive, HEENT.  All pertinent positives are noted in the HPI.  All others are negative.   PHYSICAL EXAMINATION: ECOG PERFORMANCE STATUS: 2 - Symptomatic, <50% confined to bed  . Vitals:   02/12/18 1244  BP: (!) 141/96  Pulse: 71  Resp: 14  Temp: 97.6 F (36.4 C)  SpO2: 99%   Filed Weights   02/12/18 1244  Weight: 199 lb 14.4 oz (90.7 kg)   .Body mass index is 26.37 kg/m. . Wt Readings from Last 3 Encounters:  02/12/18 199 lb 14.4 oz (90.7 kg)  12/31/17 192 lb 14.4 oz (87.5 kg)  11/14/17 199 lb 6.4 oz (90.4 kg)    GENERAL:alert, in no acute distress and comfortable SKIN: no acute rashes, no significant lesions. EYES: conjunctiva are pink and non-injected, sclera anicteric OROPHARYNX: MMM, no exudates, no  oropharyngeal erythema or ulceration NECK: supple, no JVD LYMPH:  no palpable lymphadenopathy in the cervical, axillary or inguinal regions LUNGS: clear to auscultation b/l with normal respiratory effort HEART: regular rate & rhythm ABDOMEN:  normoactive bowel sounds , non tender, not distended. No palpable hepatosplenomegaly.  Extremity: no pedal edema. TTP of the superior aspect of the shoulder with significantly limited ROM of left shoulder PSYCH: alert & oriented x 3 with fluent speech NEURO: no focal motor/sensory deficits   LABORATORY DATA:  I have reviewed the data as listed  . CBC Latest Ref Rng & Units 01/29/2018 12/12/2017 11/14/2017  WBC 4.0 - 10.5 K/uL 10.6(H) 9.7 11.8(H)  Hemoglobin 13.0 - 17.0 g/dL 11.4(L) 12.4(L) 11.9(L)  Hematocrit 39.0 - 52.0 % 38.2(L) 38.3(L) 37.7(L)  Platelets 150 - 400 K/uL 240 306 320    CMP Latest Ref Rng & Units 01/29/2018 12/12/2017 11/14/2017  Glucose 70 - 99 mg/dL 90 93 92  BUN 8 - 23 mg/dL '9 13 15  '$ Creatinine 0.61 - 1.24 mg/dL 0.68 0.81 0.75  Sodium 135 - 145 mmol/L 139 138 137  Potassium 3.5 - 5.1 mmol/L 4.1 4.4 4.4  Chloride 98 - 111 mmol/L 108 105 101  CO2 22 - 32 mmol/L '27 28 28  '$ Calcium 8.9 - 10.3 mg/dL 9.1 9.2 9.0  Total Protein 6.5 - 8.1 g/dL 7.1 7.6 7.7  Total Bilirubin 0.3 - 1.2 mg/dL 0.3 0.4 <0.2(L)  Alkaline Phos 38 - 126 U/L 810(H) 608(H) 625(H)  AST 15 - 41 U/L 28 53(H) 48(H)  ALT 0 - 44 U/L '8 7 12              '$ RADIOGRAPHIC STUDIES: I have personally reviewed the radiological images as listed and agreed with the findings in the report. No results found.  ASSESSMENT & PLAN:   72 y.o. AAM with previous h/o of locally advanced pT3b,pN0 prostate cancer now with concern for newly noted metastatic prostate cancer.  1) Metastatic Prostate Cancer  PSA level 550 pre-treatment and have improved significantly and were down to 0.6. Now gradually rising again and  if upt o  49.7  without any other associated new  symptoms. Patient had been noted to have extensive osseous metastatic disease throughout the spine and bilaterally in the calvarium as well. Also noted to have local recurrence in the prostatic bed, retroperitoneal Lnadenoapathy and some mediastinal LNadenoapathy.  ECOG PS 1 Previously locally advanced pT3b,pN0 diagnosed in 10/2012 s/p radical prostatectomy and ADT x 1 yr. Testosterone level 7 -- is dropping appropriately with treatment. S/p 6 cycles of taxotere.  12/25/17 PET/CT revealed Solitary retroperitoneal lymph node with radiotracer activity adjacent the IVC most consistent with prostate cancer metastasis. 2. Distant nodal metastasis in the LEFT and RIGHT axilla. Not typical location for prostate cancer metastasis but the activity is intense and favor such. 3. New periosteal reaction within the RIGHT iliac bone and LEFT scapula with intense radiotracer activity consistent active skeletal metastasis. Additional active metastasis in the RIGHT mandible and proximal RIGHT femur.   2) Anterior mandibular pain  Orthopantogram done - showed IMPRESSION: Residual teeth 23-26 with decay.  No mandibular osseous lesion seen Patient has completed dental extractions and is using dentures and   PLAN  -Due to the recent PET scan and new found active areas, and prior elevated PSA, this is concerning for prostate cancer recurrence.Did start patient on Enzalutamide given now castrate resistant prostate cancer. -Also discussed that if pain worsens, then we may consider palliative radiation therapy to aid with pain control.  -Discussed pt labwork from 01/29/18; blood counts and chemistries are otherwise stable. Vitamin D low at 19.8. PSA at 3.6 -02/12/18 PSA is pending -Continue '160mg'$  Enzalutamide daily  -Continue Lupron every 12 weeks -Continue Xgeva every 4 weeks -Will refill 50k Vitamin D today, continue twice a week  -Will see the pt back in 6 weeks     3) Elevated alkaline phosphatase level due to  extensive bone mets.   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 Stable. Hg at 12.1 today (08/14/17) -would anticipate mild anemia from ADT  5) Hematuria due to local recurrence of prostate cancer - 1 episode of hematuria - now resolved  6) Anorexia with weight loss of about 15-20lbs. Notes improved po intake with Marinol. Patient notes he's been eating much better. . Wt Readings from Last 3 Encounters:  02/12/18 199 lb 14.4 oz (90.7 kg)  12/31/17 192 lb 14.4 oz (87.5 kg)  11/14/17 199 lb 6.4 oz (90.4 kg)    7) Vit D deficiency with hypocalcemia -- levels are normalizing. -Being aggressively replaced since his calcium levels have dropped after Xgeva likely reflecting significant bone calcium deficits from extensive healing bone lesions. -Continue ergocalciferol 50,000U weekly  -Continue Vit D daily    8) Neoplasm related pain - much improved patient is has not needed much of his prescribed pain medications Plan  -Continue Percocet when necessary for neoplasm related bone pains, refilled today  -Senna S for bowel prophylaxis  9) Colonic thickening in transverse colon thought to be related to spasm/incomplete distension of colon in this area. -would recommend pursuing colonoscopy with PCP --pending  10) Left shoulder pain Patient has left shoulder pain s/p injury 3-4 months ago.  -Will refer the patient to an orthopedist for further evaluation -Will order a left shoulder xray to be completed in the near future to rule out a possible fracture to left shoulder  -XR left shoulder today- reviewed results - No fracture or dislocation is noted in the left shoulder. Sclerosis of scapula is noted consistent with metastatic disease. -Referral to orthopedics  for left shoulder pain and limited ROM   11) Sebaceous cyst to posterior mid upper back - infected sebaceous- expressed purulent secretions from cys- will treat with bactrim and if persistent discomfort will need  surgical referral for consideration of drainage of cyst.    Continue Lupron q12 weeks Continue Marye Round MD appointment on 02/26/2018 nexty ND appointment on 03/29/2018   All of the patients questions were answered with apparent satisfaction. The patient knows to call the clinic with any problems, questions or concerns.   The total time spent in the appt was 25 minutes and more than 50% was on counseling and direct patient cares.   Sullivan Lone MD [place] AAHIVMS [facility] Hematology/Oncology Physician [facility]  (Office):       [phone] (Work cell):  [phone] (Fax):           [phone]  I, Baldwin Jamaica, am acting as a scribe for Dr. Sullivan Lone.   .I have reviewed the above documentation for accuracy and completeness, and I agree with the above. [name] MD

## 2018-02-11 NOTE — Telephone Encounter (Signed)
[place] [facility] 12/31 moved appointments to 1/2. Spoke with relative. Schedule mailed. Other appointments remain the same.

## 2018-02-12 ENCOUNTER — Inpatient Hospital Stay: Payer: Medicare Other

## 2018-02-12 ENCOUNTER — Inpatient Hospital Stay (HOSPITAL_BASED_OUTPATIENT_CLINIC_OR_DEPARTMENT_OTHER): Payer: Medicare Other | Admitting: Hematology

## 2018-02-12 ENCOUNTER — Telehealth: Payer: Self-pay | Admitting: Hematology

## 2018-02-12 VITALS — BP 141/96 | HR 71 | Temp 97.6°F | Resp 14 | Ht 73.0 in | Wt 199.9 lb

## 2018-02-12 DIAGNOSIS — G893 Neoplasm related pain (acute) (chronic): Secondary | ICD-10-CM | POA: Diagnosis not present

## 2018-02-12 DIAGNOSIS — D6481 Anemia due to antineoplastic chemotherapy: Secondary | ICD-10-CM | POA: Diagnosis not present

## 2018-02-12 DIAGNOSIS — C7951 Secondary malignant neoplasm of bone: Secondary | ICD-10-CM | POA: Diagnosis not present

## 2018-02-12 DIAGNOSIS — C61 Malignant neoplasm of prostate: Secondary | ICD-10-CM

## 2018-02-12 DIAGNOSIS — M25512 Pain in left shoulder: Secondary | ICD-10-CM | POA: Diagnosis not present

## 2018-02-12 DIAGNOSIS — L723 Sebaceous cyst: Secondary | ICD-10-CM | POA: Diagnosis not present

## 2018-02-12 DIAGNOSIS — R933 Abnormal findings on diagnostic imaging of other parts of digestive tract: Secondary | ICD-10-CM

## 2018-02-12 DIAGNOSIS — E559 Vitamin D deficiency, unspecified: Secondary | ICD-10-CM | POA: Diagnosis not present

## 2018-02-12 DIAGNOSIS — Z5111 Encounter for antineoplastic chemotherapy: Secondary | ICD-10-CM | POA: Diagnosis not present

## 2018-02-12 DIAGNOSIS — R7989 Other specified abnormal findings of blood chemistry: Secondary | ICD-10-CM | POA: Diagnosis not present

## 2018-02-12 MED ORDER — VITAMIN D (ERGOCALCIFEROL) 1.25 MG (50000 UNIT) PO CAPS: 50000.0000 [IU] | ORAL_CAPSULE | ORAL | 6 refills | Status: DC

## 2018-02-12 MED ORDER — OXYCODONE-ACETAMINOPHEN 10-325 MG PO TABS: 1.0000 | ORAL_TABLET | ORAL | 0 refills | Status: DC | PRN

## 2018-02-12 NOTE — Telephone Encounter (Signed)
Gave pt avs and calendar  °

## 2018-02-12 NOTE — Patient Instructions (Signed)
Thank you for choosing [facility] to provide your oncology and hematology care.  To afford each patient quality time with our providers, please arrive 30 minutes before your scheduled appointment time.  If you arrive late for your appointment, you may be asked to reschedule.  We strive to give you quality time with our providers, and arriving late affects you and other patients whose appointments are after yours.    If you are a no show for multiple scheduled visits, you may be dismissed from the clinic at the providers discretion.     Again, thank you for choosing [facility], our hope is that these requests will decrease the amount of time that you wait before being seen by our physicians.  ______________________________________________________________________   Should you have questions after your visit to the [facility], please contact our office at [phone] between the hours of 8:30 and 4:30 p.m.    Voicemails left after 4:30p.m will not be returned until the following business day.     For prescription refill requests, please have your pharmacy contact us directly.  Please also try to allow 48 hours for prescription requests.     Please contact the scheduling department for questions regarding scheduling.  For scheduling of procedures such as PET scans, CT scans, MRI, Ultrasound, etc please contact central scheduling at [phone].     Resources For Cancer Patients and Caregivers:    Oncolink.org:  A wonderful resource for patients and healthcare providers for information regarding your disease, ways to tract your treatment, what to expect, etc.      American Cancer Society:  800-227-2345  Can help patients locate various types of support and financial assistance   Cancer Care: 1-800-813-HOPE (4673) Provides financial assistance, online support groups, medication/co-pay assistance.     [place] DSS:  336-641-3447 Where to apply  for food stamps, Medicaid, and utility assistance   Medicare Rights Center: 800-333-4114 Helps people with Medicare understand their rights and benefits, navigate the Medicare system, and secure the quality healthcare they deserve   SCAT: 336-333-6589 [place] [employer]'s shared-ride transportation service for eligible riders who have a disability that prevents them from riding the fixed route bus.     For additional information on assistance programs please contact our social worker:   Abigail Elmore:  [phone]  

## 2018-02-13 LAB — PROSTATE-SPECIFIC AG, SERUM (LABCORP): Prostate Specific Ag, Serum: 1.7 ng/mL (ref 0.0–4.0)

## 2018-02-14 ENCOUNTER — Ambulatory Visit: Payer: Medicare Other | Admitting: Hematology

## 2018-02-14 ENCOUNTER — Other Ambulatory Visit: Payer: Medicare Other

## 2018-02-14 ENCOUNTER — Ambulatory Visit: Payer: Medicare Other

## 2018-02-26 ENCOUNTER — Other Ambulatory Visit: Payer: Self-pay | Admitting: Hematology

## 2018-02-26 ENCOUNTER — Other Ambulatory Visit: Payer: Medicare Other

## 2018-02-26 ENCOUNTER — Inpatient Hospital Stay: Payer: Medicare Other | Attending: Hematology

## 2018-02-26 ENCOUNTER — Other Ambulatory Visit: Payer: Self-pay

## 2018-02-26 ENCOUNTER — Ambulatory Visit: Payer: Medicare Other | Admitting: Hematology

## 2018-02-26 ENCOUNTER — Inpatient Hospital Stay: Payer: Medicare Other

## 2018-02-26 DIAGNOSIS — Z7189 Other specified counseling: Secondary | ICD-10-CM

## 2018-02-26 DIAGNOSIS — C61 Malignant neoplasm of prostate: Secondary | ICD-10-CM | POA: Diagnosis not present

## 2018-02-26 DIAGNOSIS — C7951 Secondary malignant neoplasm of bone: Secondary | ICD-10-CM | POA: Diagnosis not present

## 2018-02-26 DIAGNOSIS — Z79899 Other long term (current) drug therapy: Secondary | ICD-10-CM | POA: Diagnosis not present

## 2018-02-26 LAB — CBC WITH DIFFERENTIAL/PLATELET
ABS IMMATURE GRANULOCYTES: 0.02 10*3/uL (ref 0.00–0.07)
BASOS ABS: 0 10*3/uL (ref 0.0–0.1)
Basophils Relative: 0 %
Eosinophils Absolute: 0.1 10*3/uL (ref 0.0–0.5)
Eosinophils Relative: 1 %
HCT: 41.3 % (ref 39.0–52.0)
Hemoglobin: 12.3 g/dL — ABNORMAL LOW (ref 13.0–17.0)
Immature Granulocytes: 0 %
Lymphocytes Relative: 69 %
Lymphs Abs: 6.4 10*3/uL — ABNORMAL HIGH (ref 0.7–4.0)
MCH: 27.2 pg (ref 26.0–34.0)
MCHC: 29.8 g/dL — ABNORMAL LOW (ref 30.0–36.0)
MCV: 91.4 fL (ref 80.0–100.0)
MONOS PCT: 6 %
Monocytes Absolute: 0.5 10*3/uL (ref 0.1–1.0)
NEUTROS ABS: 2.2 10*3/uL (ref 1.7–7.7)
NRBC: 0 % (ref 0.0–0.2)
Neutrophils Relative %: 24 %
Platelets: 253 10*3/uL (ref 150–400)
RBC: 4.52 MIL/uL (ref 4.22–5.81)
RDW: 17.4 % — ABNORMAL HIGH (ref 11.5–15.5)
WBC: 9.2 10*3/uL (ref 4.0–10.5)

## 2018-02-26 LAB — BASIC METABOLIC PANEL
ANION GAP: 6 (ref 5–15)
BUN: 10 mg/dL (ref 8–23)
CO2: 26 mmol/L (ref 22–32)
Calcium: 8.7 mg/dL — ABNORMAL LOW (ref 8.9–10.3)
Chloride: 108 mmol/L (ref 98–111)
Creatinine, Ser: 0.72 mg/dL (ref 0.61–1.24)
Glucose, Bld: 64 mg/dL — ABNORMAL LOW (ref 70–99)
POTASSIUM: 4.4 mmol/L (ref 3.5–5.1)
SODIUM: 140 mmol/L (ref 135–145)

## 2018-02-26 MED ORDER — DENOSUMAB 120 MG/1.7ML ~~LOC~~ SOLN
120.0000 mg | Freq: Once | SUBCUTANEOUS | Status: AC
  Administered 2018-02-26: 120 mg via SUBCUTANEOUS

## 2018-02-26 NOTE — Progress Notes (Signed)
Ok to treat with calcium at 8.7 per Dr, Irene Limbo

## 2018-02-27 LAB — PROSTATE-SPECIFIC AG, SERUM (LABCORP): PROSTATE SPECIFIC AG, SERUM: 1 ng/mL (ref 0.0–4.0)

## 2018-03-05 ENCOUNTER — Other Ambulatory Visit: Payer: Self-pay | Admitting: Hematology

## 2018-03-05 DIAGNOSIS — C61 Malignant neoplasm of prostate: Secondary | ICD-10-CM

## 2018-03-06 ENCOUNTER — Telehealth: Payer: Self-pay | Admitting: *Deleted

## 2018-03-06 ENCOUNTER — Ambulatory Visit: Payer: Self-pay

## 2018-03-06 NOTE — Telephone Encounter (Signed)
Patient left VM stating he had been contacted about a medicine refill, but that he didn't;t need one and would be good until about April 10, 2018. Contacted patient to inquire/verify which medication he was referencing. Reached VM and left a message asking pt to contact office to provide medication name.

## 2018-03-26 ENCOUNTER — Ambulatory Visit: Payer: Self-pay | Admitting: Hematology

## 2018-03-26 ENCOUNTER — Ambulatory Visit: Payer: Self-pay

## 2018-03-26 ENCOUNTER — Other Ambulatory Visit: Payer: Self-pay

## 2018-03-26 ENCOUNTER — Other Ambulatory Visit: Payer: Self-pay | Admitting: *Deleted

## 2018-03-26 DIAGNOSIS — C61 Malignant neoplasm of prostate: Secondary | ICD-10-CM

## 2018-03-28 ENCOUNTER — Inpatient Hospital Stay: Payer: Medicare Other | Attending: Hematology | Admitting: Hematology

## 2018-03-28 ENCOUNTER — Other Ambulatory Visit: Payer: Self-pay

## 2018-03-28 ENCOUNTER — Inpatient Hospital Stay: Payer: Medicare Other

## 2018-03-28 DIAGNOSIS — C61 Malignant neoplasm of prostate: Secondary | ICD-10-CM | POA: Insufficient documentation

## 2018-03-28 DIAGNOSIS — C7951 Secondary malignant neoplasm of bone: Secondary | ICD-10-CM | POA: Insufficient documentation

## 2018-03-28 DIAGNOSIS — Z5111 Encounter for antineoplastic chemotherapy: Secondary | ICD-10-CM | POA: Insufficient documentation

## 2018-03-28 MED ORDER — DENOSUMAB 120 MG/1.7ML ~~LOC~~ SOLN
SUBCUTANEOUS | Status: AC
  Filled 2018-03-28: qty 1.7

## 2018-04-01 ENCOUNTER — Other Ambulatory Visit: Payer: Self-pay | Admitting: Hematology

## 2018-04-01 DIAGNOSIS — C7951 Secondary malignant neoplasm of bone: Secondary | ICD-10-CM

## 2018-04-02 ENCOUNTER — Telehealth: Payer: Self-pay | Admitting: *Deleted

## 2018-04-02 NOTE — Telephone Encounter (Signed)
Patient left voice mail - missed appts on 1/2 and needs to reschedule all of them. Also said he had called the pharmacy about medications that need to be refilled. Contacted patient and told him scheduling staff would be notified to schedule appts and will contact him. Office will expect refill requests. Patient verbalized understanding

## 2018-04-05 ENCOUNTER — Telehealth: Payer: Self-pay | Admitting: Hematology

## 2018-04-05 NOTE — Telephone Encounter (Signed)
R/s appt per 1/10 sch message - pt is aware of appt date and time   

## 2018-04-08 NOTE — Progress Notes (Signed)
HEMATOLOGY/ONCOLOGY CLINIC NOTE  Date of Service: 04/09/18   Patient Care Team: [name], NP as PCP - General (Internal Medicine)  CHIEF COMPLAINTS/PURPOSE OF CONSULTATION:   F/u for continue mx of metastatic prostate cancer  HISTORY OF PRESENTING ILLNESS:  Eric Osborn. is a wonderful 73 y.o. male who has been referred to [place] from the [facility] long [facility] ED by [name] PA-C for evaluation and management of metastatic malignancy concerning for metastatic prostate cancer.  Patient has had a h/o locally advanced prostate cancer diagnosed in 2014 and had a radical prostatectomy and pelvic LN dissection by Dr [name] in 10/2012. Pathology showed pT3b pN0 disease (with positive margins, extraprostatic extension, seminal vesicle involvement and angiolymphatic invasion). Bone scan was negative for metastatic disease. Patient report no post-op RT. He notes that he received lupron shots as per his urologist for 1 year post-operatively. He was being monitored by Dr [name] and last had clinical evaluation and PSA about 64month ago - PSA level not available in our system. He notes that he was lost to f/u since then.  Patient presented to the ED with worsening lower and middle back pain for 2 weeks. Also notes about 15-20lbs weight loss and anorexia over the last 6-8weeks. He also notes hematuria. No fevers/chills. Patient had a CT abd/pelvis in the ED which showed Innumerable sclerotic lesions in all imaged bones consistent metastatic disease. The prostate gland has an irregular appearance and the patient may have prostate carcinoma. A few enlarged retroperitoneal lymph nodes are identified worrisome for additional foci of metastatic disease.  Patient's pain was somewhat controlled with prn percocet in ED and he was discharged home with f/u with [place] Patient notes that the patient wakes him up in the night.  No urinary retention. No loss of bowel or bladder control. No new  neurological symptoms in his extremities.   INTERVAL HISTORY   Eric Osborn is here to follow up of his metastatic prostatic cancer and next Lupron shot. The patient's last visit with [place] on 02/12/18. The pt reports that he is doing well overall.   The pt reports that he has not developed any new concerns in the interim. He has continued to take 1680mEnzalutamide, and forgot to take his medication on two days total. He notes that he has gained some weight in the interim and has been eating well. He denies new bone pains and leg swelling. He has continued on Percocet for adequate pain control of stable bone pains. He notes that he is moving his bowels well.   Lab results today (04/09/18) of CBC w/diff and CMP is as follows: all values are WNL except for HGB at 12.9, RDW at 15.8, Lymphs abs at 7.6k, Glucose at 103, Calcium at 7.9, Alk Phos at 222. 04/09/18 PSA is pending  On review of systems, pt reports stable energy, eating well, weight gain, moving his bowels well, and denies new bone pains, new headaches, abdominal pains, leg swelling, and any other symptoms.   MEDICAL HISTORY:   Past Medical History:  Diagnosis Date  . Arthritis    left knee, left shoulder  . Erectile dysfunction 06/25/2012  . Foley catheter in place   . Goiter 06/25/2012  . H/O hiatal hernia   . Hematuria    occasional  . Hemorrhoid   . History of bladder infections   . Joint pain     SURGICAL HISTORY: Past Surgical History:  Procedure Laterality Date  .  INGUINAL HERNIA REPAIR  8 yrs ago  . LYMPHADENECTOMY Bilateral 11/20/2012   Procedure: WITH BILATERAL PELVIC LYMPH NODE DISSECTION ;  Surgeon: [name], MD;  Location: [facility];  Service: Urology;  Laterality: Bilateral;  . ROBOT ASSISTED LAPAROSCOPIC RADICAL PROSTATECTOMY N/A 11/20/2012   Procedure: ROBOTIC ASSISTED LAPAROSCOPIC RADICAL PROSTATECTOMY;  Surgeon: [name], MD;  Location: [facility];  Service: Urology;  Laterality: N/A;    SOCIAL  HISTORY: Social History   Socioeconomic History  . Marital status: Divorced    Spouse name: Not on file  . Number of children: Not on file  . Years of education: 78  . Highest education level: Not on file  Occupational History  . Occupation: Retired    Comment: Education officer, community  . Financial resource strain: Not on file  . Food insecurity:    Worry: Not on file    Inability: Not on file  . Transportation needs:    Medical: Not on file    Non-medical: Not on file  Tobacco Use  . Smoking status: Former Smoker    Packs/day: 0.25    Years: 25.00    Pack years: 6.25    Types: Cigarettes  . Smokeless tobacco: Former Systems developer  . Tobacco comment: quit date 2016  Substance and Sexual Activity  . Alcohol use: Not Currently    Comment: occasional beer   . Drug use: Yes    Types: Marijuana    Comment: 3 x a month uses marijuana  . Sexual activity: Yes  Lifestyle  . Physical activity:    Days per week: Not on file    Minutes per session: Not on file  . Stress: Not on file  Relationships  . Social connections:    Talks on phone: Not on file    Gets together: Not on file    Attends religious service: Not on file    Active member of club or organization: Not on file    Attends meetings of clubs or organizations: Not on file    Relationship status: Not on file  . Intimate partner violence:    Fear of current or ex partner: Not on file    Emotionally abused: Not on file    Physically abused: Not on file    Forced sexual activity: Not on file  Other Topics Concern  . Not on file  Social History Narrative   Regular exercise-no   Caffeine Use-yes    FAMILY HISTORY: Family History  Problem Relation Age of Onset  . Heart disease Sister   . Stroke Sister   . Colon cancer Paternal Uncle   . Diabetes Neg Hx     ALLERGIES:  is allergic to heparin and poison ivy extract [poison ivy extract].  MEDICATIONS:  Current Outpatient Medications  Medication Sig Dispense Refill  .  leuprolide (LUPRON) 22.5 MG injection Inject 22.5 mg into the muscle every 3 (three) months.    [name] oxyCODONE-acetaminophen (PERCOCET) 10-325 MG tablet TAKE 1 TABLET BY MOUTH EVERY 4 (FOUR) HOURS AS NEEDED FOR PAIN. 90 tablet 0  . sulfamethoxazole-trimethoprim (BACTRIM DS,SEPTRA DS) 800-160 MG tablet Take 1 tablet by mouth 2 (two) times daily. 14 tablet 0  . Vitamin D, Ergocalciferol, (DRISDOL) 1.25 MG (50000 UT) CAPS capsule Take 1 capsule (50,000 Units total) by mouth 2 (two) times a week. 24 capsule 6  . XTANDI 40 MG capsule TAKE 4 CAPSULES (160 MG TOTAL) BY MOUTH DAILY. 120 capsule 1  . denosumab (XGEVA) 120 MG/1.7ML  SOLN injection Inject 120 mg into the skin once.     No current facility-administered medications for this visit.    Facility-Administered Medications Ordered in Other Visits  Medication Dose Route Frequency Provider Last Rate Last Dose  . denosumab (XGEVA) injection 120 mg  120 mg Subcutaneous Once [name], MD      . leuprolide (LUPRON) injection 22.5 mg  22.5 mg Intramuscular Once [name], MD        REVIEW OF SYSTEMS:    A 10+ POINT REVIEW OF SYSTEMS WAS OBTAINED including neurology, dermatology, psychiatry, cardiac, respiratory, lymph, extremities, GI, GU, Musculoskeletal, constitutional, breasts, reproductive, HEENT.  All pertinent positives are noted in the HPI.  All others are negative.   PHYSICAL EXAMINATION: ECOG PERFORMANCE STATUS: 2 - Symptomatic, <50% confined to bed  . Vitals:   04/09/18 1137  BP: (!) 163/93  Pulse: (!) 105  Resp: 18  Temp: (!) 97.5 F (36.4 C)  SpO2: 99%   Filed Weights   04/09/18 1137  Weight: 207 lb 3.2 oz (94 kg)   .Body mass index is 27.34 kg/m. . Wt Readings from Last 3 Encounters:  04/09/18 207 lb 3.2 oz (94 kg)  02/12/18 199 lb 14.4 oz (90.7 kg)  12/31/17 192 lb 14.4 oz (87.5 kg)   GENERAL:alert, in no acute distress and comfortable SKIN: no acute rashes, no significant lesions EYES:  conjunctiva are pink and non-injected, sclera anicteric OROPHARYNX: MMM, no exudates, no oropharyngeal erythema or ulceration NECK: supple, no JVD LYMPH:  no palpable lymphadenopathy in the cervical, axillary or inguinal regions LUNGS: clear to auscultation b/l with normal respiratory effort HEART: regular rate & rhythm ABDOMEN:  normoactive bowel sounds , non tender, not distended. No palpable hepatosplenomegaly.  Extremity: no pedal edema. TTP of the superior aspect of the left shoulder with significantly limited ROM of left shoulder. PSYCH: alert & oriented x 3 with fluent speech NEURO: no focal motor/sensory deficits   LABORATORY DATA:  I have reviewed the data as listed  . CBC Latest Ref Rng & Units 04/09/2018 02/26/2018 01/29/2018  WBC 4.0 - 10.5 K/uL 9.8 9.2 10.6(H)  Hemoglobin 13.0 - 17.0 g/dL 12.9(L) 12.3(L) 11.4(L)  Hematocrit 39.0 - 52.0 % 42.3 41.3 38.2(L)  Platelets 150 - 400 K/uL 240 253 240    CMP Latest Ref Rng & Units 04/09/2018 02/26/2018 01/29/2018  Glucose 70 - 99 mg/dL 103(H) 64(L) 90  BUN 8 - 23 mg/dL _0 Creatinine 0.61 - 1.24 mg/dL 0.70 0.72 0.68  Sodium 135 - 145 mmol/L 141 140 139  Potassium 3.5 - 5.1 mmol/L 3.6 4.4 4.1  Chloride 98 - 111 mmol/L 109 108 108  CO2 22 - 32 mmol/L _1 Calcium 8.9 - 10.3 mg/dL 7.9(L) 8.7(L) 9.1  Total Protein 6.5 - 8.1 g/dL 7.2 - 7.1  Total Bilirubin 0.3 - 1.2 mg/dL 0.3 - 0.3  Alkaline Phos 38 - 126 U/L 222(H) - 810(H)  AST 15 - 41 U/L 26 - 28  ALT 0 - 44 U/L 9 - 8            RADIOGRAPHIC STUDIES: I have personally reviewed the radiological images as listed and agreed with the findings in the report. No results found.  ASSESSMENT & PLAN:   73 y.o. AAM with previous h/o of locally advanced pT3b,pN0 prostate cancer now with concern for newly noted metastatic prostate cancer.  1) Metastatic Prostate Cancer  PSA level 550 pre-treatment and have improved significantly and  were down to 0.6. Now gradually  rising again and if upt o  49.7  without any other associated new symptoms. Patient had been noted to have extensive osseous metastatic disease throughout the spine and bilaterally in the calvarium as well. Also noted to have local recurrence in the prostatic bed, retroperitoneal Lnadenoapathy and some mediastinal LNadenoapathy.  ECOG PS 1 Previously locally advanced pT3b,pN0 diagnosed in 10/2012 s/p radical prostatectomy and ADT x 1 yr. Testosterone level 7 -- is dropping appropriately with treatment. S/p 6 cycles of taxotere.  12/25/17 PET/CT revealed Solitary retroperitoneal lymph node with radiotracer activity adjacent the IVC most consistent with prostate cancer metastasis. 2. Distant nodal metastasis in the LEFT and RIGHT axilla. Not typical location for prostate cancer metastasis but the activity is intense and favor such. 3. New periosteal reaction within the RIGHT iliac bone and LEFT scapula with intense radiotracer activity consistent active skeletal metastasis. Additional active metastasis in the RIGHT mandible and proximal RIGHT femur.   2) Anterior mandibular pain  Orthopantogram done - showed IMPRESSION: Residual teeth 23-26 with decay.  No mandibular osseous lesion seen Patient has completed dental extractions and is using dentures and   PLAN -Discussed pt labwork today, 04/09/18; HGB continuing to improve now to 12.9, Alk Phos improved to 222, other counts and chemistries are stable -04/09/18 PSA is pending. Last available PSA from 02/26/18 was at 1.0 -The pt has no prohibitive toxicities from continuing 1109m Enzalutamide at this time.   -If pain worsens then may consider palliative radiation therapy to aid with pain control  -Continue Xgeva every 4 weeks -Continue Lupron every 12 weeks  -Continue 50k Vitamin D twice a week -Will see the pt back in 8 weeks   3) Elevated alkaline phosphatase level due to extensive bone mets.   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 Stable. -would anticipate mild anemia from ADT  5) Hematuria due to local recurrence of prostate cancer - 1 episode of hematuria - now resolved  6) Anorexia resolved.. Notes improved po intake with progressive weight gain. Patient notes he's been eating much better. . Wt Readings from Last 3 Encounters:  04/09/18 207 lb 3.2 oz (94 kg)  02/12/18 199 lb 14.4 oz (90.7 kg)  12/31/17 192 lb 14.4 oz (87.5 kg)    7) Vit D deficiency with hypocalcemia -- levels are normalizing. -Being aggressively replaced since his calcium levels have dropped after Xgeva likely reflecting significant bone calcium deficits from extensive healing bone lesions. -Continue ergocalciferol 50,000U weekly  -Continue Vit D daily    8) Neoplasm related pain - much improved patient is has not needed much of his prescribed pain medications Plan  -Continue Percocet when necessary for neoplasm related bone pains, refilled today  -Senna S for bowel prophylaxis  9) Colonic thickening in transverse colon thought to be related to spasm/incomplete distension of colon in this area. -would recommend pursuing colonoscopy with PCP --pending  10) Left shoulder pain Patient has left shoulder pain s/p injury 3-4 months ago.  -XR left shoulder today- reviewed results - No fracture or dislocation is noted in the left shoulder. Sclerosis of scapula is noted consistent with metastatic disease. -Referral to orthopedics for left shoulder pain and limited ROM    Continue Lupron q12 weeks -- please schedule for next 4 treatments Continue Xgeva q4weeks - please schedule next 12 treatments RTC with Dr KIrene Limbowith labs in 8 weeks   All of the patients questions were answered with apparent satisfaction. The patient  knows to call the clinic with any problems, questions or concerns.   The total time spent in the appt was 25 minutes and more than 50% was on counseling and direct patient cares.   Sullivan Lone MD [place] AAHIVMS  [facility] Hematology/Oncology Physician [facility]  (Office):       [phone] (Work cell):  [phone] (Fax):           [phone]  I, Baldwin [place], am acting as a scribe for Dr. Sullivan Lone.   .I have reviewed the above documentation for accuracy and completeness, and I agree with the above. [name] MD

## 2018-04-09 ENCOUNTER — Inpatient Hospital Stay: Payer: Medicare Other

## 2018-04-09 ENCOUNTER — Inpatient Hospital Stay (HOSPITAL_BASED_OUTPATIENT_CLINIC_OR_DEPARTMENT_OTHER): Payer: Medicare Other | Admitting: Hematology

## 2018-04-09 VITALS — BP 125/82 | HR 76 | Temp 97.5°F | Resp 18

## 2018-04-09 VITALS — BP 163/93 | HR 105 | Temp 97.5°F | Resp 18 | Ht 73.0 in | Wt 207.2 lb

## 2018-04-09 DIAGNOSIS — C7951 Secondary malignant neoplasm of bone: Secondary | ICD-10-CM

## 2018-04-09 DIAGNOSIS — R6884 Jaw pain: Secondary | ICD-10-CM | POA: Diagnosis not present

## 2018-04-09 DIAGNOSIS — G893 Neoplasm related pain (acute) (chronic): Secondary | ICD-10-CM

## 2018-04-09 DIAGNOSIS — R748 Abnormal levels of other serum enzymes: Secondary | ICD-10-CM | POA: Diagnosis not present

## 2018-04-09 DIAGNOSIS — D6481 Anemia due to antineoplastic chemotherapy: Secondary | ICD-10-CM | POA: Diagnosis not present

## 2018-04-09 DIAGNOSIS — E559 Vitamin D deficiency, unspecified: Secondary | ICD-10-CM

## 2018-04-09 DIAGNOSIS — T451X5A Adverse effect of antineoplastic and immunosuppressive drugs, initial encounter: Secondary | ICD-10-CM | POA: Diagnosis not present

## 2018-04-09 DIAGNOSIS — D630 Anemia in neoplastic disease: Secondary | ICD-10-CM | POA: Diagnosis not present

## 2018-04-09 DIAGNOSIS — C61 Malignant neoplasm of prostate: Secondary | ICD-10-CM

## 2018-04-09 DIAGNOSIS — Z87891 Personal history of nicotine dependence: Secondary | ICD-10-CM

## 2018-04-09 DIAGNOSIS — Z7189 Other specified counseling: Secondary | ICD-10-CM

## 2018-04-09 DIAGNOSIS — M25512 Pain in left shoulder: Secondary | ICD-10-CM

## 2018-04-09 DIAGNOSIS — Z5111 Encounter for antineoplastic chemotherapy: Secondary | ICD-10-CM | POA: Diagnosis not present

## 2018-04-09 LAB — CMP (CANCER CENTER ONLY)
ALT: 9 U/L (ref 0–44)
AST: 26 U/L (ref 15–41)
Albumin: 3.9 g/dL (ref 3.5–5.0)
Alkaline Phosphatase: 222 U/L — ABNORMAL HIGH (ref 38–126)
Anion gap: 7 (ref 5–15)
BUN: 10 mg/dL (ref 8–23)
CALCIUM: 7.9 mg/dL — AB (ref 8.9–10.3)
CO2: 25 mmol/L (ref 22–32)
Chloride: 109 mmol/L (ref 98–111)
Creatinine: 0.7 mg/dL (ref 0.61–1.24)
GFR, Est AFR Am: >60 mL/min (ref >60)
GFR, Estimated: >60 mL/min (ref >60)
GLUCOSE: 103 mg/dL — AB (ref 70–99)
Potassium: 3.6 mmol/L (ref 3.5–5.1)
Sodium: 141 mmol/L (ref 135–145)
Total Bilirubin: 0.3 mg/dL (ref 0.3–1.2)
Total Protein: 7.2 g/dL (ref 6.5–8.1)

## 2018-04-09 LAB — CBC WITH DIFFERENTIAL (CANCER CENTER ONLY)
Abs Immature Granulocytes: 0.02 10*3/uL (ref 0.00–0.07)
Basophils Absolute: 0 10*3/uL (ref 0.0–0.1)
Basophils Relative: 0 %
Eosinophils Absolute: 0.1 10*3/uL (ref 0.0–0.5)
Eosinophils Relative: 1 %
HCT: 42.3 % (ref 39.0–52.0)
Hemoglobin: 12.9 g/dL — ABNORMAL LOW (ref 13.0–17.0)
Immature Granulocytes: 0 %
Lymphocytes Relative: 78 %
Lymphs Abs: 7.6 10*3/uL — ABNORMAL HIGH (ref 0.7–4.0)
MCH: 27.4 pg (ref 26.0–34.0)
MCHC: 30.5 g/dL (ref 30.0–36.0)
MCV: 90 fL (ref 80.0–100.0)
MONOS PCT: 4 %
Monocytes Absolute: 0.4 10*3/uL (ref 0.1–1.0)
NEUTROS PCT: 17 %
Neutro Abs: 1.7 10*3/uL (ref 1.7–7.7)
Platelet Count: 240 10*3/uL (ref 150–400)
RBC: 4.7 MIL/uL (ref 4.22–5.81)
RDW: 15.8 % — ABNORMAL HIGH (ref 11.5–15.5)
WBC Count: 9.8 10*3/uL (ref 4.0–10.5)
nRBC: 0 % (ref 0.0–0.2)

## 2018-04-09 MED ORDER — LEUPROLIDE ACETATE (3 MONTH) 22.5 MG IM KIT
PACK | INTRAMUSCULAR | Status: AC
  Filled 2018-04-09: qty 22.5

## 2018-04-09 MED ORDER — LEUPROLIDE ACETATE (3 MONTH) 22.5 MG IM KIT: 22.5000 mg | PACK | Freq: Once | INTRAMUSCULAR | Status: DC

## 2018-04-09 MED ORDER — DENOSUMAB 120 MG/1.7ML ~~LOC~~ SOLN
SUBCUTANEOUS | Status: AC
  Filled 2018-04-09: qty 1.7

## 2018-04-09 MED ORDER — DENOSUMAB 120 MG/1.7ML ~~LOC~~ SOLN
120.0000 mg | Freq: Once | SUBCUTANEOUS | Status: AC
  Administered 2018-04-09: 120 mg via SUBCUTANEOUS

## 2018-04-09 NOTE — Progress Notes (Signed)
Per Dr. Irene Limbo no Lupron injection to be given today but it is ok for pt to still get Xgeva injection with Calcium 7.9

## 2018-04-09 NOTE — Patient Instructions (Signed)
Thank you for choosing [facility] to provide your oncology and hematology care.  To afford each patient quality time with our providers, please arrive 30 minutes before your scheduled appointment time.  If you arrive late for your appointment, you may be asked to reschedule.  We strive to give you quality time with our providers, and arriving late affects you and other patients whose appointments are after yours.    If you are a no show for multiple scheduled visits, you may be dismissed from the clinic at the providers discretion.     Again, thank you for choosing [facility], our hope is that these requests will decrease the amount of time that you wait before being seen by our physicians.  ______________________________________________________________________   Should you have questions after your visit to the [facility], please contact our office at [phone] between the hours of 8:30 and 4:30 p.m.    Voicemails left after 4:30p.m will not be returned until the following business day.     For prescription refill requests, please have your pharmacy contact us directly.  Please also try to allow 48 hours for prescription requests.     Please contact the scheduling department for questions regarding scheduling.  For scheduling of procedures such as PET scans, CT scans, MRI, Ultrasound, etc please contact central scheduling at [phone].     Resources For Cancer Patients and Caregivers:    Oncolink.org:  A wonderful resource for patients and healthcare providers for information regarding your disease, ways to tract your treatment, what to expect, etc.      American Cancer Society:  800-227-2345  Can help patients locate various types of support and financial assistance   Cancer Care: 1-800-813-HOPE (4673) Provides financial assistance, online support groups, medication/co-pay assistance.     [place] DSS:  336-641-3447 Where to apply  for food stamps, Medicaid, and utility assistance   Medicare Rights Center: 800-333-4114 Helps people with Medicare understand their rights and benefits, navigate the Medicare system, and secure the quality healthcare they deserve   SCAT: 336-333-6589 [employer] [employer]'s shared-ride transportation service for eligible riders who have a disability that prevents them from riding the fixed route bus.     For additional information on assistance programs please contact our social worker:   Abigail Elmore:  [phone]  

## 2018-04-09 NOTE — Patient Instructions (Signed)

## 2018-04-10 LAB — PROSTATE-SPECIFIC AG, SERUM (LABCORP): Prostate Specific Ag, Serum: 1.6 ng/mL (ref 0.0–4.0)

## 2018-04-22 ENCOUNTER — Telehealth: Payer: Self-pay | Admitting: *Deleted

## 2018-04-22 NOTE — Telephone Encounter (Signed)
Contacted patient at Dr. Grier Mitts direction. Per LOS on 04/09/2018, patient to return to see Dr. Irene Limbo in 8 weeks, so does not need to have labs or appt with him 1/27. Advised patient that he needs to come for Lupron injection only. Patient verbalized understanding.

## 2018-04-23 ENCOUNTER — Inpatient Hospital Stay: Payer: Medicare Other

## 2018-04-23 ENCOUNTER — Other Ambulatory Visit: Payer: Self-pay

## 2018-04-23 ENCOUNTER — Inpatient Hospital Stay: Payer: Medicare Other | Admitting: Hematology

## 2018-04-23 VITALS — BP 139/83 | HR 86 | Temp 98.3°F | Resp 18

## 2018-04-23 DIAGNOSIS — C7951 Secondary malignant neoplasm of bone: Secondary | ICD-10-CM | POA: Diagnosis not present

## 2018-04-23 DIAGNOSIS — C61 Malignant neoplasm of prostate: Secondary | ICD-10-CM

## 2018-04-23 DIAGNOSIS — Z7189 Other specified counseling: Secondary | ICD-10-CM

## 2018-04-23 DIAGNOSIS — Z5111 Encounter for antineoplastic chemotherapy: Secondary | ICD-10-CM | POA: Diagnosis not present

## 2018-04-23 MED ORDER — DENOSUMAB 120 MG/1.7ML ~~LOC~~ SOLN
SUBCUTANEOUS | Status: AC
  Filled 2018-04-23: qty 1.7

## 2018-04-23 MED ORDER — LEUPROLIDE ACETATE (3 MONTH) 22.5 MG IM KIT
PACK | INTRAMUSCULAR | Status: AC
  Filled 2018-04-23: qty 22.5

## 2018-04-23 MED ORDER — LEUPROLIDE ACETATE (3 MONTH) 22.5 MG IM KIT
22.5000 mg | PACK | Freq: Once | INTRAMUSCULAR | Status: AC
  Administered 2018-04-23: 22.5 mg via INTRAMUSCULAR

## 2018-04-23 NOTE — Patient Instructions (Signed)

## 2018-04-30 ENCOUNTER — Other Ambulatory Visit: Payer: Self-pay | Admitting: Hematology

## 2018-04-30 DIAGNOSIS — C7951 Secondary malignant neoplasm of bone: Secondary | ICD-10-CM

## 2018-04-30 DIAGNOSIS — C61 Malignant neoplasm of prostate: Secondary | ICD-10-CM

## 2018-05-06 ENCOUNTER — Other Ambulatory Visit: Payer: Self-pay | Admitting: *Deleted

## 2018-05-06 DIAGNOSIS — C61 Malignant neoplasm of prostate: Secondary | ICD-10-CM

## 2018-05-07 ENCOUNTER — Inpatient Hospital Stay: Payer: Medicare Other

## 2018-05-07 ENCOUNTER — Inpatient Hospital Stay (HOSPITAL_BASED_OUTPATIENT_CLINIC_OR_DEPARTMENT_OTHER): Payer: Medicare Other | Admitting: Medical

## 2018-05-07 ENCOUNTER — Inpatient Hospital Stay: Payer: Medicare Other | Attending: Hematology

## 2018-05-07 VITALS — BP 153/81 | HR 70 | Temp 97.6°F | Resp 18 | Ht 73.0 in | Wt 210.3 lb

## 2018-05-07 VITALS — BP 150/88 | HR 70 | Temp 98.0°F | Resp 18

## 2018-05-07 DIAGNOSIS — Z87891 Personal history of nicotine dependence: Secondary | ICD-10-CM | POA: Diagnosis not present

## 2018-05-07 DIAGNOSIS — C61 Malignant neoplasm of prostate: Secondary | ICD-10-CM

## 2018-05-07 DIAGNOSIS — Z5111 Encounter for antineoplastic chemotherapy: Secondary | ICD-10-CM | POA: Insufficient documentation

## 2018-05-07 DIAGNOSIS — K130 Diseases of lips: Secondary | ICD-10-CM | POA: Diagnosis not present

## 2018-05-07 DIAGNOSIS — C7951 Secondary malignant neoplasm of bone: Secondary | ICD-10-CM | POA: Diagnosis not present

## 2018-05-07 DIAGNOSIS — Z800 Family history of malignant neoplasm of digestive organs: Secondary | ICD-10-CM

## 2018-05-07 DIAGNOSIS — Z7189 Other specified counseling: Secondary | ICD-10-CM

## 2018-05-07 LAB — CMP (CANCER CENTER ONLY)
ALT: 9 U/L (ref 0–44)
AST: 36 U/L (ref 15–41)
Albumin: 4 g/dL (ref 3.5–5.0)
Alkaline Phosphatase: 170 U/L — ABNORMAL HIGH (ref 38–126)
Anion gap: 5 (ref 5–15)
BUN: 12 mg/dL (ref 8–23)
CO2: 28 mmol/L (ref 22–32)
Calcium: 8.4 mg/dL — ABNORMAL LOW (ref 8.9–10.3)
Chloride: 106 mmol/L (ref 98–111)
Creatinine: 0.76 mg/dL (ref 0.61–1.24)
GFR, Est AFR Am: >60 mL/min (ref >60)
Glucose, Bld: 123 mg/dL — ABNORMAL HIGH (ref 70–99)
Potassium: 4 mmol/L (ref 3.5–5.1)
Sodium: 139 mmol/L (ref 135–145)
Total Bilirubin: 0.3 mg/dL (ref 0.3–1.2)
Total Protein: 7.3 g/dL (ref 6.5–8.1)

## 2018-05-07 LAB — CBC WITH DIFFERENTIAL (CANCER CENTER ONLY)
Abs Immature Granulocytes: 0.02 10*3/uL (ref 0.00–0.07)
BASOS PCT: 0 %
Basophils Absolute: 0 10*3/uL (ref 0.0–0.1)
EOS ABS: 0.2 10*3/uL (ref 0.0–0.5)
Eosinophils Relative: 2 %
HCT: 42.6 % (ref 39.0–52.0)
Hemoglobin: 13.1 g/dL (ref 13.0–17.0)
Immature Granulocytes: 0 %
Lymphocytes Relative: 69 %
Lymphs Abs: 5.7 10*3/uL — ABNORMAL HIGH (ref 0.7–4.0)
MCH: 27.5 pg (ref 26.0–34.0)
MCHC: 30.8 g/dL (ref 30.0–36.0)
MCV: 89.5 fL (ref 80.0–100.0)
Monocytes Absolute: 0.6 10*3/uL (ref 0.1–1.0)
Monocytes Relative: 8 %
Neutro Abs: 1.7 10*3/uL (ref 1.7–7.7)
Neutrophils Relative %: 21 %
Platelet Count: 221 10*3/uL (ref 150–400)
RBC: 4.76 MIL/uL (ref 4.22–5.81)
RDW: 14.8 % (ref 11.5–15.5)
WBC: 8.3 10*3/uL (ref 4.0–10.5)
nRBC: 0 % (ref 0.0–0.2)

## 2018-05-07 MED ORDER — MUPIROCIN 2 % EX OINT: 1.0000 "application " | TOPICAL_OINTMENT | Freq: Four times a day (QID) | CUTANEOUS | 0 refills | Status: AC

## 2018-05-07 MED ORDER — DENOSUMAB 120 MG/1.7ML ~~LOC~~ SOLN
SUBCUTANEOUS | Status: AC
  Filled 2018-05-07: qty 1.7

## 2018-05-07 MED ORDER — DOXYCYCLINE HYCLATE 100 MG PO TABS: 100.0000 mg | ORAL_TABLET | Freq: Two times a day (BID) | ORAL | 0 refills | Status: AC

## 2018-05-07 MED ORDER — DENOSUMAB 120 MG/1.7ML ~~LOC~~ SOLN
120.0000 mg | Freq: Once | SUBCUTANEOUS | Status: AC
  Administered 2018-05-07: 120 mg via SUBCUTANEOUS

## 2018-05-07 NOTE — Progress Notes (Signed)
Pt seen by PA Van only, no RN assessment at this time.  PA Van aware. 

## 2018-05-07 NOTE — Progress Notes (Signed)
[facility] Progress Note   Eric Osborn [id number] 06-28-1945 73 y.o.  Eric Merritts. is managed by Dr. Sullivan Lone  Actively treated with chemotherapy/immunotherapy/hormonal therapy: yes  Current Therapy: Lupron and Xgeva  Assessment: Plan:    Abscess of lip - Plan: mupirocin ointment (BACTROBAN) 2 %, doxycycline (VIBRA-TABS) 100 MG tablet  Prostate cancer metastatic to multiple sites [facility])  Bone metastases ([place])   Abscess of the upper lip: The patient was given a prescription for doxycycline 100 mg p.o. twice daily x14 days and Bactroban to be used 3 to 4 times daily.  Prostate cancer with bone metastases: The patient continues receiving  Lupron and Xgeva under the direction of Dr. Irene Limbo.  Please see After Visit Summary for patient specific instructions.  Future Appointments  Date Time Provider [place]  06/04/2018  2:00 PM [facility] 2 [facility] None  06/04/2018  2:20 PM [name], MD [facility] None  06/04/2018  2:45 PM [facility] [place] FLUSH [facility] None  07/02/2018 11:15 AM [facility] LAB 3 [facility] None  07/02/2018 11:45 AM [facility] [place] FLUSH [facility] None  07/30/2018 11:30 AM [facility] LAB 2 [facility] None  07/30/2018 12:00 PM [name], MD [facility] None  07/30/2018 12:30 PM [facility] [place] FLUSH [facility] None  08/27/2018 11:15 AM [facility] LAB 6 [facility] None  08/27/2018 11:45 AM [facility] FLUSH [facility] None  09/24/2018 11:15 AM [facility] LAB 3 [facility] None  09/24/2018 11:45 AM [facility] [place] FLUSH [facility] None    No orders of the defined types were placed in this encounter.      Subjective:   Patient ID:  Eric Ells. is a 73 y.o. (DOB [birth year]) male.  Chief Complaint: No chief complaint on file.   HPI Eric Corron. is a most pleasant 73 year old male with a history of a metastatic prostate cancer who is managed by Dr. Irene Limbo and has been receiving Xgeva and  Lupron.  He presents to the clinic today as a walk-in patient.  He has an enlarged tender area over his middle upper lip.  He has had purulent drainage from the site.  He reports that he had a "cold sore" around 12 days ago which he nicked with a razor while shaving.  The area has been enlarged and tender.  He pushed on the area and was able to express purulent material from the site.  He has not sought medical attention.  He has been cleaning the area with hydrogen peroxide.  He denies fevers, chills, or sweats.  He visits throughout his community on behalf of his church and is self-conscious regarding the area in his upper lip.  Medications: I have reviewed the patient's current medications.  Allergies:  Allergies  Allergen Reactions  . Heparin Other (See Comments)    Religious reasons  . Poison Ivy Extract [Poison Ivy Extract] Nausea And Vomiting and Rash    Past Medical History:  Diagnosis Date  . Arthritis    left knee, left shoulder  . Erectile dysfunction 06/25/2012  . Foley catheter in place   . Goiter 06/25/2012  . H/O hiatal hernia   . Hematuria    occasional  . Hemorrhoid   . History of bladder infections   . Joint pain     Past Surgical History:  Procedure Laterality Date  . INGUINAL HERNIA REPAIR  8 yrs ago  . LYMPHADENECTOMY Bilateral 11/20/2012   Procedure: WITH BILATERAL PELVIC LYMPH NODE DISSECTION ;  Surgeon: Camelia Eng  [name], MD;  Location: [facility];  Service: Urology;  Laterality: Bilateral;  . ROBOT ASSISTED LAPAROSCOPIC RADICAL PROSTATECTOMY N/A 11/20/2012   Procedure: ROBOTIC ASSISTED LAPAROSCOPIC RADICAL PROSTATECTOMY;  Surgeon: [name], MD;  Location: [facility];  Service: Urology;  Laterality: N/A;    Family History  Problem Relation Age of Onset  . Heart disease Sister   . Stroke Sister   . Colon cancer Paternal Uncle   . Diabetes Neg Hx     Social History   Socioeconomic History  . Marital status: Divorced    Spouse name: Not on file  . Number of  children: Not on file  . Years of education: 58  . Highest education level: Not on file  Occupational History  . Occupation: Retired    Comment: Education officer, community  . Financial resource strain: Not on file  . Food insecurity:    Worry: Not on file    Inability: Not on file  . Transportation needs:    Medical: Not on file    Non-medical: Not on file  Tobacco Use  . Smoking status: Former Smoker    Packs/day: 0.25    Years: 25.00    Pack years: 6.25    Types: Cigarettes  . Smokeless tobacco: Former Systems developer  . Tobacco comment: quit date 2016  Substance and Sexual Activity  . Alcohol use: Not Currently    Comment: occasional beer   . Drug use: Yes    Types: Marijuana    Comment: 3 x a month uses marijuana  . Sexual activity: Yes  Lifestyle  . Physical activity:    Days per week: Not on file    Minutes per session: Not on file  . Stress: Not on file  Relationships  . Social connections:    Talks on phone: Not on file    Gets together: Not on file    Attends religious service: Not on file    Active member of club or organization: Not on file    Attends meetings of clubs or organizations: Not on file    Relationship status: Not on file  . Intimate partner violence:    Fear of current or ex partner: Not on file    Emotionally abused: Not on file    Physically abused: Not on file    Forced sexual activity: Not on file  Other Topics Concern  . Not on file  Social History Narrative   Regular exercise-no   Caffeine Use-yes    Past Medical History, Surgical history, Social history, and Family history were reviewed and updated as appropriate.   Please see review of systems for further details on the patient's review from today.   Review of Systems:  Review of Systems  Constitutional: Negative for chills, diaphoresis and fever.  Skin: Negative for color change.       Swelling, tenderness, and erythema of the upper middle lip.    Objective:   Physical Exam:  BP (!)  153/81 (BP Location: Left Arm, Patient Position: Sitting)   Pulse 70   Temp 97.6 F (36.4 C) (Oral)   Resp 18   Ht 6\' 1"  (1.854 m)   Wt 210 lb 4.8 oz (95.4 kg)   SpO2 99%   BMI 27.75 kg/m  ECOG: 0  Physical Exam Constitutional:      General: He is not in acute distress.    Appearance: Normal appearance. He is not ill-appearing.  HENT:     Head:  Mouth/Throat:     Mouth: Mucous membranes are moist.     Pharynx: Oropharynx is clear. No oropharyngeal exudate or posterior oropharyngeal erythema.  Cardiovascular:     Rate and Rhythm: Regular rhythm. Tachycardia present.     Heart sounds: No murmur. No friction rub. No gallop.   Pulmonary:     Effort: Pulmonary effort is normal. No respiratory distress.     Breath sounds: Normal breath sounds. No wheezing or rales.  Lymphadenopathy:     Cervical: Cervical adenopathy present.  Skin:    General: Skin is warm and dry.  Neurological:     Mental Status: He is alert.     Gait: Gait normal.  Psychiatric:        Mood and Affect: Mood normal.        Behavior: Behavior normal.        Thought Content: Thought content normal.        Judgment: Judgment normal.     Lab Review:     Component Value Date/Time   NA 139 05/07/2018 1113   NA 140 02/14/2017 1041   K 4.0 05/07/2018 1113   K 3.9 02/14/2017 1041   CL 106 05/07/2018 1113   CO2 28 05/07/2018 1113   CO2 27 02/14/2017 1041   GLUCOSE 123 (H) 05/07/2018 1113   GLUCOSE 70 02/14/2017 1041   BUN 12 05/07/2018 1113   BUN 16.6 02/14/2017 1041   CREATININE 0.76 05/07/2018 1113   CREATININE 0.7 02/14/2017 1041   CALCIUM 8.4 (L) 05/07/2018 1113   CALCIUM 9.6 02/14/2017 1041   PROT 7.3 05/07/2018 1113   PROT 7.1 02/14/2017 1041   ALBUMIN 4.0 05/07/2018 1113   ALBUMIN 3.9 02/14/2017 1041   AST 36 05/07/2018 1113   AST 40 (H) 02/14/2017 1041   ALT 9 05/07/2018 1113   ALT 20 02/14/2017 1041   ALKPHOS 170 (H) 05/07/2018 1113   ALKPHOS 273 (H) 02/14/2017 1041   BILITOT 0.3  05/07/2018 1113   BILITOT 0.40 02/14/2017 1041   GFRNONAA >60 05/07/2018 1113   GFRAA >60 05/07/2018 1113       Component Value Date/Time   WBC 8.3 05/07/2018 1113   WBC 9.2 02/26/2018 1232   RBC 4.76 05/07/2018 1113   HGB 13.1 05/07/2018 1113   HGB 12.1 (L) 02/14/2017 1041   HCT 42.6 05/07/2018 1113   HCT 38.8 02/14/2017 1041   PLT 221 05/07/2018 1113   PLT 227 02/14/2017 1041   MCV 89.5 05/07/2018 1113   MCV 89.4 02/14/2017 1041   MCH 27.5 05/07/2018 1113   MCHC 30.8 05/07/2018 1113   RDW 14.8 05/07/2018 1113   RDW 16.1 (H) 02/14/2017 1041   LYMPHSABS 5.7 (H) 05/07/2018 1113   LYMPHSABS 6.1 (H) 02/14/2017 1041   MONOABS 0.6 05/07/2018 1113   MONOABS 0.5 02/14/2017 1041   EOSABS 0.2 05/07/2018 1113   EOSABS 0.1 02/14/2017 1041   BASOSABS 0.0 05/07/2018 1113   BASOSABS 0.0 02/14/2017 1041   -------------------------------  Imaging from last 24 hours (if applicable):  Radiology interpretation: No results found.

## 2018-05-07 NOTE — Progress Notes (Signed)
Per Dr.Kale, ok to treat with calcium at 8.4. Porsche Cates LPN

## 2018-05-08 LAB — PROSTATE-SPECIFIC AG, SERUM (LABCORP): Prostate Specific Ag, Serum: 3.9 ng/mL (ref 0.0–4.0)

## 2018-05-14 ENCOUNTER — Ambulatory Visit: Payer: Self-pay

## 2018-05-14 ENCOUNTER — Ambulatory Visit: Payer: Self-pay | Admitting: Hematology

## 2018-05-14 ENCOUNTER — Other Ambulatory Visit: Payer: Self-pay

## 2018-05-30 ENCOUNTER — Telehealth: Payer: Self-pay

## 2018-05-30 ENCOUNTER — Telehealth: Payer: Self-pay | Admitting: *Deleted

## 2018-05-30 ENCOUNTER — Other Ambulatory Visit: Payer: Self-pay | Admitting: Hematology

## 2018-05-30 DIAGNOSIS — C7951 Secondary malignant neoplasm of bone: Secondary | ICD-10-CM

## 2018-05-30 MED ORDER — OXYCODONE-ACETAMINOPHEN 10-325 MG PO TABS: 1.0000 | ORAL_TABLET | ORAL | 0 refills | Status: DC | PRN

## 2018-05-30 NOTE — Telephone Encounter (Signed)
Oral Oncology Patient Advocate Encounter  [place] has tried to get in touch with the patient to get his Xtandi refilled with no success 3 times. I called the patient today and he still has 6 doses on hand. We scheduled for the Xtandi to be filled on 3/10 for him to pick up after his appointment that day. He stated he has missed 6 doses in the last month.  [place] Patient [place] Phone [phone] Fax [phone] 05/30/2018   3:21 PM

## 2018-05-30 NOTE — Telephone Encounter (Signed)
Patient asked if Dr.Kale will refill his pain medicine, says he's been taking 2 a day. States he still has same pain, below stomach, below belly button, and sometimes on the right side. He had it when he last saw Dr. Irene Limbo. Dr. Irene Limbo said it might be gas pains. Patient reports pain is no worse than at last appt, but hasn't gone away. He plans to take a laxative tonight (reports regular BMs) and see if that will help. Will inform Dr. Irene Limbo of request to refill pain medicine and update on stomach pain. Information given to Dr.Kale.

## 2018-05-31 ENCOUNTER — Telehealth: Payer: Self-pay | Admitting: *Deleted

## 2018-05-31 NOTE — Telephone Encounter (Signed)
Contacted patient for f/u on yesterday's call. Patient reports abdominal pain/discomfort better following laxative and BM. States pain comes and goes. Encouraged follow up with PCP/Urgent Care if changes in pain over weekend. Patient verbalized understanding and states he will talk with Dr. Irene Limbo during appt on Tuesday.

## 2018-06-03 NOTE — Progress Notes (Signed)
HEMATOLOGY/ONCOLOGY CLINIC NOTE  Date of Service: 06/04/18   Patient Care Team: [name], NP as PCP - General (Internal Medicine)  CHIEF COMPLAINTS/PURPOSE OF CONSULTATION:   F/u for continue mx of metastatic prostate cancer  HISTORY OF PRESENTING ILLNESS:  Eric Osborn. is a wonderful 73 y.o. male who has been referred to [place] from the [facility] long hospital ED by [name] PA-C for evaluation and management of metastatic malignancy concerning for metastatic prostate cancer.  Patient has had a h/o locally advanced prostate cancer diagnosed in 2014 and had a radical prostatectomy and pelvic LN dissection by Dr [name] in 10/2012. Pathology showed pT3b pN0 disease (with positive margins, extraprostatic extension, seminal vesicle involvement and angiolymphatic invasion). Bone scan was negative for metastatic disease. Patient report no post-op RT. He notes that he received lupron shots as per his urologist for 1 year post-operatively. He was being monitored by Dr [name] and last had clinical evaluation and PSA about 25month ago - PSA level not available in our system. He notes that he was lost to f/u since then.  Patient presented to the ED with worsening lower and middle back pain for 2 weeks. Also notes about 15-20lbs weight loss and anorexia over the last 6-8weeks. He also notes hematuria. No fevers/chills. Patient had a CT abd/pelvis in the ED which showed Innumerable sclerotic lesions in all imaged bones consistent metastatic disease. The prostate gland has an irregular appearance and the patient may have prostate carcinoma. A few enlarged retroperitoneal lymph nodes are identified worrisome for additional foci of metastatic disease.  Patient's pain was somewhat controlled with prn percocet in ED and he was discharged home with f/u with [place] Patient notes that the patient wakes him up in the night.  No urinary retention. No loss of bowel or bladder control. No new  neurological symptoms in his extremities.   INTERVAL HISTORY   WBrandi Osborn is here to follow up of his metastatic prostatic cancer and next Lupron shot. The patient's last visit with [place] on 04/09/18. The pt reports that he is doing well overall.   The pt reports that he has felt a small lump in his lower abdomen and pelvis, that seems to move. He notes that he has "been moving around right much," and has possibly strained. He endorses relief when he tucks his cell phone into his belt in front of his lump. He has been very active, and notes that his back pain has been controlled.  The pt notes that he misses taking Xtandi about once a week.  Lab results today (06/04/18) of CBC w/diff and CMP is as follows: all values are WNL except for Lymphs abs at 6.2k, Potassium at 3.3, Glucose at 128, Calcium at 7.6, Alk Phos at 149.  On review of systems, pt reports stable weight, staying active, eating well, small abdominal lump, and denies any other symptoms.   MEDICAL HISTORY:   Past Medical History:  Diagnosis Date  . Arthritis    left knee, left shoulder  . Erectile dysfunction 06/25/2012  . Foley catheter in place   . Goiter 06/25/2012  . H/O hiatal hernia   . Hematuria    occasional  . Hemorrhoid   . History of bladder infections   . Joint pain     SURGICAL HISTORY: Past Surgical History:  Procedure Laterality Date  . INGUINAL HERNIA REPAIR  8 yrs ago  . LYMPHADENECTOMY Bilateral 11/20/2012   Procedure: WITH BILATERAL  PELVIC LYMPH NODE DISSECTION ;  Surgeon: [name], MD;  Location: [facility];  Service: Urology;  Laterality: Bilateral;  . ROBOT ASSISTED LAPAROSCOPIC RADICAL PROSTATECTOMY N/A 11/20/2012   Procedure: ROBOTIC ASSISTED LAPAROSCOPIC RADICAL PROSTATECTOMY;  Surgeon: [name], MD;  Location: [facility];  Service: Urology;  Laterality: N/A;    SOCIAL HISTORY: Social History   Socioeconomic History  . Marital status: Divorced    Spouse name: Not on file  .  Number of children: Not on file  . Years of education: 86  . Highest education level: Not on file  Occupational History  . Occupation: Retired    Comment: Education officer, community  . Financial resource strain: Not on file  . Food insecurity:    Worry: Not on file    Inability: Not on file  . Transportation needs:    Medical: Not on file    Non-medical: Not on file  Tobacco Use  . Smoking status: Former Smoker    Packs/day: 0.25    Years: 25.00    Pack years: 6.25    Types: Cigarettes  . Smokeless tobacco: Former Systems developer  . Tobacco comment: quit date 2016  Substance and Sexual Activity  . Alcohol use: Not Currently    Comment: occasional beer   . Drug use: Yes    Types: Marijuana    Comment: 3 x a month uses marijuana  . Sexual activity: Yes  Lifestyle  . Physical activity:    Days per week: Not on file    Minutes per session: Not on file  . Stress: Not on file  Relationships  . Social connections:    Talks on phone: Not on file    Gets together: Not on file    Attends religious service: Not on file    Active member of club or organization: Not on file    Attends meetings of clubs or organizations: Not on file    Relationship status: Not on file  . Intimate partner violence:    Fear of current or ex partner: Not on file    Emotionally abused: Not on file    Physically abused: Not on file    Forced sexual activity: Not on file  Other Topics Concern  . Not on file  Social History Narrative   Regular exercise-no   Caffeine Use-yes    FAMILY HISTORY: Family History  Problem Relation Age of Onset  . Heart disease Sister   . Stroke Sister   . Colon cancer Paternal Uncle   . Diabetes Neg Hx     ALLERGIES:  is allergic to heparin and poison ivy extract [poison ivy extract].  MEDICATIONS:  Current Outpatient Medications  Medication Sig Dispense Refill  . denosumab (XGEVA) 120 MG/1.7ML SOLN injection Inject 120 mg into the skin once.    [name] leuprolide (LUPRON) 22.5 MG  injection Inject 22.5 mg into the muscle every 3 (three) months.    . mupirocin ointment (BACTROBAN) 2 % Apply 1 application topically 4 (four) times daily. 30 g 0  . oxyCODONE-acetaminophen (PERCOCET) 10-325 MG tablet Take 1 tablet by mouth every 4 (four) hours as needed. for pain 90 tablet 0  . sulfamethoxazole-trimethoprim (BACTRIM DS,SEPTRA DS) 800-160 MG tablet Take 1 tablet by mouth 2 (two) times daily. 14 tablet 0  . Vitamin D, Ergocalciferol, (DRISDOL) 1.25 MG (50000 UT) CAPS capsule Take 1 capsule (50,000 Units total) by mouth 2 (two) times a week. 24 capsule 6  . XTANDI 40 MG capsule  TAKE 4 CAPSULES (160 MG TOTAL) BY MOUTH DAILY. 120 capsule 1   No current facility-administered medications for this visit.     REVIEW OF SYSTEMS:    A 10+ POINT REVIEW OF SYSTEMS WAS OBTAINED including neurology, dermatology, psychiatry, cardiac, respiratory, lymph, extremities, GI, GU, Musculoskeletal, constitutional, breasts, reproductive, HEENT.  All pertinent positives are noted in the HPI.  All others are negative.   PHYSICAL EXAMINATION: ECOG PERFORMANCE STATUS: 2 - Symptomatic, <50% confined to bed  . Vitals:   06/04/18 1455  BP: 127/76  Pulse: 88  Resp: 18  Temp: 98.3 F (36.8 C)  SpO2: 97%   Filed Weights   06/04/18 1455  Weight: 207 lb (93.9 kg)   .Body mass index is 27.31 kg/m. . Wt Readings from Last 3 Encounters:  06/04/18 207 lb (93.9 kg)  05/07/18 210 lb 4.8 oz (95.4 kg)  04/09/18 207 lb 3.2 oz (94 kg)   GENERAL:alert, in no acute distress and comfortable SKIN: no acute rashes, no significant lesions EYES: conjunctiva are pink and non-injected, sclera anicteric OROPHARYNX: MMM, no exudates, no oropharyngeal erythema or ulceration NECK: supple, no JVD LYMPH:  no palpable lymphadenopathy in the cervical, axillary or inguinal regions LUNGS: clear to auscultation b/l with normal respiratory effort HEART: regular rate & rhythm ABDOMEN:  normoactive bowel sounds, not  distended. No palpable hepatosplenomegaly. Left inguinal hernia that is reducible.  Extremity: no pedal edema. TTP of the superior aspect of the left shoulder with significantly limited ROM of left shoulder PSYCH: alert & oriented x 3 with fluent speech NEURO: no focal motor/sensory deficits   LABORATORY DATA:  I have reviewed the data as listed  . CBC Latest Ref Rng & Units 06/04/2018 05/07/2018 04/09/2018  WBC 4.0 - 10.5 K/uL 9.2 8.3 9.8  Hemoglobin 13.0 - 17.0 g/dL 13.6 13.1 12.9(L)  Hematocrit 39.0 - 52.0 % 44.5 42.6 42.3  Platelets 150 - 400 K/uL 213 221 240    CMP Latest Ref Rng & Units 06/04/2018 05/07/2018 04/09/2018  Glucose 70 - 99 mg/dL 128(H) 123(H) 103(H)  BUN 8 - 23 mg/dL _0 Creatinine 0.61 - 1.24 mg/dL 0.89 0.76 0.70  Sodium 135 - 145 mmol/L 142 139 141  Potassium 3.5 - 5.1 mmol/L 3.3(L) 4.0 3.6  Chloride 98 - 111 mmol/L 106 106 109  CO2 22 - 32 mmol/L _1 Calcium 8.9 - 10.3 mg/dL 7.6(L) 8.4(L) 7.9(L)  Total Protein 6.5 - 8.1 g/dL 7.5 7.3 7.2  Total Bilirubin 0.3 - 1.2 mg/dL 0.4 0.3 0.3  Alkaline Phos 38 - 126 U/L 149(H) 170(H) 222(H)  AST 15 - 41 U/L 31 36 26  ALT 0 - 44 U/L _2 RADIOGRAPHIC STUDIES: I have personally reviewed the radiological images as listed and agreed with the findings in the report. No results found.  ASSESSMENT & PLAN:   73 y.o. AAM with previous h/o of locally advanced pT3b,pN0 prostate cancer now with concern for newly noted metastatic prostate cancer.  1) Metastatic Prostate Cancer  PSA level 550 pre-treatment and have improved significantly and were down to 0.6. Now gradually rising again and if upt o  49.7  without any other associated new symptoms. Patient had been noted to have extensive osseous metastatic disease throughout the spine and bilaterally in the calvarium as well. Also noted to have local recurrence in the prostatic bed, retroperitoneal Lnadenoapathy and some mediastinal  LNadenoapathy.  ECOG PS  1 Previously locally advanced pT3b,pN0 diagnosed in 10/2012 s/p radical prostatectomy and ADT x 1 yr. Testosterone level 7 -- is dropping appropriately with treatment. S/p 6 cycles of taxotere.  12/25/17 PET/CT revealed Solitary retroperitoneal lymph node with radiotracer activity adjacent the IVC most consistent with prostate cancer metastasis. 2. Distant nodal metastasis in the LEFT and RIGHT axilla. Not typical location for prostate cancer metastasis but the activity is intense and favor such. 3. New periosteal reaction within the RIGHT iliac bone and LEFT scapula with intense radiotracer activity consistent active skeletal metastasis. Additional active metastasis in the RIGHT mandible and proximal RIGHT femur.   2) Anterior mandibular pain  Orthopantogram done - showed IMPRESSION: Residual teeth 23-26 with decay.  No mandibular osseous lesion seen Patient has completed dental extractions and is using dentures and   PLAN -Discussed pt labwork today, 06/04/18; blood counts and chemistries are stable -06/04/18 PSA is pending. Last available PSA from 05/07/18 was at 3.9ng -Stressed the importance of not missing his 14m Xtandi doses, and noted that it could affect treatment efficacy -The pt has no prohibitive toxicities from continuing XChignikat this time.   -Discussed that I suspect the patient has an inguinal hernia and recommend an abdominal binding belt. Advised that he present to the ED if this gets stuck and painful, and non-reducible. -If back pain worsens then may consider palliative radiation therapy to aid with pain control  -Continue Xgeva every 4 weeks -Continue Lupron every 12 weeks  -Continue 50k Vitamin D twice a week -Will see the pt back in 8 weeks  3) Elevated alkaline phosphatase level due to extensive bone mets.   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 Stable. -would anticipate mild anemia from ADT  5) Hematuria due to local  recurrence of prostate cancer - 1 episode of hematuria - now resolved  6) Anorexia resolved.. Notes improved po intake with progressive weight gain. Patient notes he's been eating much better. . Wt Readings from Last 3 Encounters:  06/04/18 207 lb (93.9 kg)  05/07/18 210 lb 4.8 oz (95.4 kg)  04/09/18 207 lb 3.2 oz (94 kg)    7) Vit D deficiency with hypocalcemia -- levels are normalizing. -Being aggressively replaced since his calcium levels have dropped after Xgeva likely reflecting significant bone calcium deficits from extensive healing bone lesions. -Continue ergocalciferol 50,000U weekly  -Continue Vit D daily    8) Neoplasm related pain - much improved patient is has not needed much of his prescribed pain medications Plan  -Continue Percocet when necessary for neoplasm related bone pains, refilled today  -Senna S for bowel prophylaxis  9) Colonic thickening in transverse colon thought to be related to spasm/incomplete distension of colon in this area. -would recommend pursuing colonoscopy with PCP --pending  10) Left shoulder pain Patient has left shoulder pain s/p injury 3-4 months ago.  -XR left shoulder today- reviewed results - No fracture or dislocation is noted in the left shoulder. Sclerosis of scapula is noted consistent with metastatic disease. -Referral to orthopedics for left shoulder pain and limited ROM    Continue Lupron q12 weeks -- please schedule for next 4 treatments Continue Xgeva q4weeks - please schedule next 12 treatments RTC with Dr KIrene Limbowith labs in 8 weeks   All of the patients questions were answered with apparent satisfaction. The patient knows to call the clinic with any problems, questions or concerns.   The total time spent in the appt was 25 minutes and more than 50%  was on counseling and direct patient cares.   Sullivan Lone MD [place] AAHIVMS [facility] Hematology/Oncology Physician [facility]  (Office):        [phone] (Work cell):  [phone] (Fax):           [phone]  I, [place], am acting as a scribe for Dr. Sullivan Lone.   .I have reviewed the above documentation for accuracy and completeness, and I agree with the above. [name] MD

## 2018-06-04 ENCOUNTER — Inpatient Hospital Stay: Payer: Medicare Other

## 2018-06-04 ENCOUNTER — Inpatient Hospital Stay: Payer: Medicare Other | Attending: Hematology

## 2018-06-04 ENCOUNTER — Telehealth: Payer: Self-pay | Admitting: Pharmacist

## 2018-06-04 ENCOUNTER — Inpatient Hospital Stay (HOSPITAL_BASED_OUTPATIENT_CLINIC_OR_DEPARTMENT_OTHER): Payer: Medicare Other | Admitting: Hematology

## 2018-06-04 ENCOUNTER — Telehealth: Payer: Self-pay | Admitting: Hematology

## 2018-06-04 VITALS — BP 127/76 | HR 88 | Temp 98.3°F | Resp 18 | Ht 73.0 in | Wt 207.0 lb

## 2018-06-04 DIAGNOSIS — C61 Malignant neoplasm of prostate: Secondary | ICD-10-CM

## 2018-06-04 DIAGNOSIS — G893 Neoplasm related pain (acute) (chronic): Secondary | ICD-10-CM | POA: Diagnosis not present

## 2018-06-04 DIAGNOSIS — Z87891 Personal history of nicotine dependence: Secondary | ICD-10-CM | POA: Diagnosis not present

## 2018-06-04 DIAGNOSIS — E559 Vitamin D deficiency, unspecified: Secondary | ICD-10-CM

## 2018-06-04 DIAGNOSIS — Z800 Family history of malignant neoplasm of digestive organs: Secondary | ICD-10-CM

## 2018-06-04 DIAGNOSIS — M25512 Pain in left shoulder: Secondary | ICD-10-CM | POA: Diagnosis not present

## 2018-06-04 DIAGNOSIS — R7989 Other specified abnormal findings of blood chemistry: Secondary | ICD-10-CM | POA: Diagnosis not present

## 2018-06-04 DIAGNOSIS — C7951 Secondary malignant neoplasm of bone: Secondary | ICD-10-CM | POA: Diagnosis not present

## 2018-06-04 DIAGNOSIS — C773 Secondary and unspecified malignant neoplasm of axilla and upper limb lymph nodes: Secondary | ICD-10-CM | POA: Diagnosis not present

## 2018-06-04 DIAGNOSIS — Z7189 Other specified counseling: Secondary | ICD-10-CM

## 2018-06-04 LAB — CBC WITH DIFFERENTIAL/PLATELET
Abs Immature Granulocytes: 0.03 10*3/uL (ref 0.00–0.07)
Basophils Absolute: 0 10*3/uL (ref 0.0–0.1)
Basophils Relative: 0 %
Eosinophils Absolute: 0 10*3/uL (ref 0.0–0.5)
Eosinophils Relative: 0 %
HCT: 44.5 % (ref 39.0–52.0)
Hemoglobin: 13.6 g/dL (ref 13.0–17.0)
Immature Granulocytes: 0 %
LYMPHS ABS: 6.2 10*3/uL — AB (ref 0.7–4.0)
Lymphocytes Relative: 68 %
MCH: 27.6 pg (ref 26.0–34.0)
MCHC: 30.6 g/dL (ref 30.0–36.0)
MCV: 90.3 fL (ref 80.0–100.0)
MONO ABS: 0.6 10*3/uL (ref 0.1–1.0)
Monocytes Relative: 7 %
NRBC: 0 % (ref 0.0–0.2)
Neutro Abs: 2.3 10*3/uL (ref 1.7–7.7)
Neutrophils Relative %: 25 %
Platelets: 213 10*3/uL (ref 150–400)
RBC: 4.93 MIL/uL (ref 4.22–5.81)
RDW: 15.2 % (ref 11.5–15.5)
WBC: 9.2 10*3/uL (ref 4.0–10.5)

## 2018-06-04 LAB — CMP (CANCER CENTER ONLY)
ALT: 9 U/L (ref 0–44)
AST: 31 U/L (ref 15–41)
Albumin: 3.9 g/dL (ref 3.5–5.0)
Alkaline Phosphatase: 149 U/L — ABNORMAL HIGH (ref 38–126)
Anion gap: 10 (ref 5–15)
BUN: 12 mg/dL (ref 8–23)
CO2: 26 mmol/L (ref 22–32)
Calcium: 7.6 mg/dL — ABNORMAL LOW (ref 8.9–10.3)
Chloride: 106 mmol/L (ref 98–111)
Creatinine: 0.89 mg/dL (ref 0.61–1.24)
GFR, Est AFR Am: >60 mL/min (ref >60)
GFR, Estimated: >60 mL/min (ref >60)
Glucose, Bld: 128 mg/dL — ABNORMAL HIGH (ref 70–99)
Potassium: 3.3 mmol/L — ABNORMAL LOW (ref 3.5–5.1)
Sodium: 142 mmol/L (ref 135–145)
Total Bilirubin: 0.4 mg/dL (ref 0.3–1.2)
Total Protein: 7.5 g/dL (ref 6.5–8.1)

## 2018-06-04 MED ORDER — DENOSUMAB 120 MG/1.7ML ~~LOC~~ SOLN
120.0000 mg | Freq: Once | SUBCUTANEOUS | Status: AC
  Administered 2018-06-04: 120 mg via SUBCUTANEOUS

## 2018-06-04 NOTE — Progress Notes (Signed)
PT receiving Xgeva today. Calcium level is 7.6. Dr. Irene Limbo is ok with giving Xgeva today. Going to be educating pt on calcium replacement

## 2018-06-04 NOTE — Patient Instructions (Signed)

## 2018-06-04 NOTE — Telephone Encounter (Signed)
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  Original Start date of oral chemotherapy: 01/05/2018  Pt is doing well today  Pt reports 6 doses of Xtandi 40mg  capsules, 4 capsules (160mg ) by mouth once daily without regard to food, missed in the last month.  Missed dose(s) attributed to: patient had run out of oatmeal, which he needs in order to be able to swallow the large Xtandi capsules. Patient is unable to get the capsules down with just water. He has purchased more oatmeal and will be sure to alert the office if he is unable to purchase his oatmeal in the future.  Pt reports the following side effects: no issues to report  Pertinent labs reviewed: OK for continued treatment.  Patient knows to call the office with questions or concerns. [facility] will continue to follow.  Johny Drilling, PharmD, BCPS, BCOP  06/04/2018 3:52 PM [facility] [phone]

## 2018-06-04 NOTE — Telephone Encounter (Signed)
Per 3/10 los  Appts already scheduled.  Added additional lab and injection appts.

## 2018-06-05 LAB — PROSTATE-SPECIFIC AG, SERUM (LABCORP): Prostate Specific Ag, Serum: 5.7 ng/mL — ABNORMAL HIGH (ref 0.0–4.0)

## 2018-06-05 MED ORDER — OXYCODONE-ACETAMINOPHEN 10-325 MG PO TABS: 1.0000 | ORAL_TABLET | ORAL | 0 refills | Status: DC | PRN

## 2018-06-20 ENCOUNTER — Other Ambulatory Visit: Payer: Self-pay | Admitting: Hematology

## 2018-06-20 DIAGNOSIS — C61 Malignant neoplasm of prostate: Secondary | ICD-10-CM

## 2018-07-02 ENCOUNTER — Inpatient Hospital Stay: Payer: Medicare Other | Attending: Hematology

## 2018-07-02 ENCOUNTER — Other Ambulatory Visit: Payer: Self-pay

## 2018-07-02 ENCOUNTER — Inpatient Hospital Stay: Payer: Medicare Other

## 2018-07-02 DIAGNOSIS — Z7189 Other specified counseling: Secondary | ICD-10-CM

## 2018-07-02 DIAGNOSIS — C61 Malignant neoplasm of prostate: Secondary | ICD-10-CM | POA: Diagnosis not present

## 2018-07-02 DIAGNOSIS — C7951 Secondary malignant neoplasm of bone: Secondary | ICD-10-CM | POA: Insufficient documentation

## 2018-07-02 DIAGNOSIS — Z5111 Encounter for antineoplastic chemotherapy: Secondary | ICD-10-CM | POA: Diagnosis not present

## 2018-07-02 LAB — CMP (CANCER CENTER ONLY)
ALT: 9 U/L (ref 0–44)
AST: 33 U/L (ref 15–41)
Albumin: 4.2 g/dL (ref 3.5–5.0)
Alkaline Phosphatase: 146 U/L — ABNORMAL HIGH (ref 38–126)
Anion gap: 9 (ref 5–15)
BUN: 11 mg/dL (ref 8–23)
CO2: 25 mmol/L (ref 22–32)
Calcium: 9.2 mg/dL (ref 8.9–10.3)
Chloride: 106 mmol/L (ref 98–111)
Creatinine: 0.78 mg/dL (ref 0.61–1.24)
GFR, Est AFR Am: >60 mL/min (ref >60)
GFR, Estimated: >60 mL/min (ref >60)
Glucose, Bld: 103 mg/dL — ABNORMAL HIGH (ref 70–99)
Potassium: 4.1 mmol/L (ref 3.5–5.1)
Sodium: 140 mmol/L (ref 135–145)
Total Bilirubin: 0.4 mg/dL (ref 0.3–1.2)
Total Protein: 7.8 g/dL (ref 6.5–8.1)

## 2018-07-02 LAB — CBC WITH DIFFERENTIAL/PLATELET
Abs Immature Granulocytes: 0.03 10*3/uL (ref 0.00–0.07)
Basophils Absolute: 0 10*3/uL (ref 0.0–0.1)
Basophils Relative: 0 %
Eosinophils Absolute: 0.2 10*3/uL (ref 0.0–0.5)
Eosinophils Relative: 2 %
HCT: 42.8 % (ref 39.0–52.0)
Hemoglobin: 13.2 g/dL (ref 13.0–17.0)
Immature Granulocytes: 0 %
Lymphocytes Relative: 73 %
Lymphs Abs: 8.4 10*3/uL — ABNORMAL HIGH (ref 0.7–4.0)
MCH: 28.1 pg (ref 26.0–34.0)
MCHC: 30.8 g/dL (ref 30.0–36.0)
MCV: 91.3 fL (ref 80.0–100.0)
Monocytes Absolute: 0.9 10*3/uL (ref 0.1–1.0)
Monocytes Relative: 8 %
Neutro Abs: 2 10*3/uL (ref 1.7–7.7)
Neutrophils Relative %: 17 %
Platelets: 222 10*3/uL (ref 150–400)
RBC: 4.69 MIL/uL (ref 4.22–5.81)
RDW: 15.5 % (ref 11.5–15.5)
WBC: 11.6 10*3/uL — ABNORMAL HIGH (ref 4.0–10.5)
nRBC: 0 % (ref 0.0–0.2)

## 2018-07-02 MED ORDER — DENOSUMAB 120 MG/1.7ML ~~LOC~~ SOLN
120.0000 mg | Freq: Once | SUBCUTANEOUS | Status: AC
  Administered 2018-07-02: 120 mg via SUBCUTANEOUS

## 2018-07-02 MED ORDER — LEUPROLIDE ACETATE (3 MONTH) 22.5 MG IM KIT
PACK | INTRAMUSCULAR | Status: AC
  Filled 2018-07-02: qty 22.5

## 2018-07-02 MED ORDER — LEUPROLIDE ACETATE (3 MONTH) 22.5 MG IM KIT
22.5000 mg | PACK | Freq: Once | INTRAMUSCULAR | Status: AC
  Administered 2018-07-02: 22.5 mg via INTRAMUSCULAR

## 2018-07-02 MED ORDER — DENOSUMAB 120 MG/1.7ML ~~LOC~~ SOLN
SUBCUTANEOUS | Status: AC
  Filled 2018-07-02: qty 1.7

## 2018-07-02 NOTE — Patient Instructions (Signed)
Leuprolide injection What is this medicine? LEUPROLIDE (loo PROE lide) is a man-made hormone. It is used to treat the symptoms of prostate cancer. This medicine may also be used to treat children with early onset of puberty. It may be used for other hormonal conditions. This medicine may be used for other purposes; ask your health care provider or pharmacist if you have questions. COMMON BRAND NAME(S): Lupron What should I tell my health care provider before I take this medicine? They need to know if you have any of these conditions: -diabetes -heart disease or previous heart attack -high blood pressure -high cholesterol -pain or difficulty passing urine -spinal cord metastasis -stroke -tobacco smoker -an unusual or allergic reaction to leuprolide, benzyl alcohol, other medicines, foods, dyes, or preservatives -pregnant or trying to get pregnant -breast-feeding How should I use this medicine? This medicine is for injection under the skin or into a muscle. You will be taught how to prepare and give this medicine. Use exactly as directed. Take your medicine at regular intervals. Do not take your medicine more often than directed. It is important that you put your used needles and syringes in a special sharps container. Do not put them in a trash can. If you do not have a sharps container, call your pharmacist or healthcare provider to get one. A special MedGuide will be given to you by the pharmacist with each prescription and refill. Be sure to read this information carefully each time. Talk to your pediatrician regarding the use of this medicine in children. While this medicine may be prescribed for children as young as 8 years for selected conditions, precautions do apply. Overdosage: If you think you have taken too much of this medicine contact a poison control center or emergency room at once. NOTE: This medicine is only for you. Do not share this medicine with others. What if I miss a  dose? If you miss a dose, take it as soon as you can. If it is almost time for your next dose, take only that dose. Do not take double or extra doses. What may interact with this medicine? Do not take this medicine with any of the following medications: -chasteberry This medicine may also interact with the following medications: -herbal or dietary supplements, like black cohosh or DHEA -male hormones, like estrogens or progestins and birth control pills, patches, rings, or injections -male hormones, like testosterone This list may not describe all possible interactions. Give your health care provider a list of all the medicines, herbs, non-prescription drugs, or dietary supplements you use. Also tell them if you smoke, drink alcohol, or use illegal drugs. Some items may interact with your medicine. What should I watch for while using this medicine? Visit your doctor or health care professional for regular checks on your progress. During the first week, your symptoms may get worse, but then will improve as you continue your treatment. You may get hot flashes, increased bone pain, increased difficulty passing urine, or an aggravation of nerve symptoms. Discuss these effects with your doctor or health care professional, some of them may improve with continued use of this medicine. Male patients may experience a menstrual cycle or spotting during the first 2 months of therapy with this medicine. If this continues, contact your doctor or health care professional. What side effects may I notice from receiving this medicine? Side effects that you should report to your doctor or health care professional as soon as possible: -allergic reactions like skin rash, itching or  hives, swelling of the face, lips, or tongue -breathing problems -chest pain -depression or memory disorders -pain in your legs or groin -pain at site where injected -severe headache -swelling of the feet and legs -visual  changes -vomiting Side effects that usually do not require medical attention (report to your doctor or health care professional if they continue or are bothersome): -breast swelling or tenderness -decrease in sex drive or performance -diarrhea -hot flashes -loss of appetite -muscle, joint, or bone pains -nausea -redness or irritation at site where injected -skin problems or acne This list may not describe all possible side effects. Call your doctor for medical advice about side effects. You may report side effects to FDA at 1-800-FDA-1088. Where should I keep my medicine? Keep out of the reach of children. Store below 25 degrees C (77 degrees F). Do not freeze. Protect from light. Do not use if it is not clear or if there are particles present. Throw away any unused medicine after the expiration date. NOTE: This sheet is a summary. It may not cover all possible information. If you have questions about this medicine, talk to your doctor, pharmacist, or health care provider.  2019 [employer]/Gold Standard (2015-11-01 10:54:35) Denosumab injection What is this medicine? DENOSUMAB (den oh sue mab) slows bone breakdown. Prolia is used to treat osteoporosis in women after menopause and in men, and in people who are taking corticosteroids for 6 months or more. Xgeva is used to treat a high calcium level due to cancer and to prevent bone fractures and other bone problems caused by multiple myeloma or cancer bone metastases. Xgeva is also used to treat giant cell tumor of the bone. This medicine may be used for other purposes; ask your health care provider or pharmacist if you have questions. COMMON BRAND NAME(S): Prolia, XGEVA What should I tell my health care provider before I take this medicine? They need to know if you have any of these conditions: -dental disease -having surgery or tooth extraction -infection -kidney disease -low levels of calcium or Vitamin D in the blood -malnutrition -on  hemodialysis -skin conditions or sensitivity -thyroid or parathyroid disease -an unusual reaction to denosumab, other medicines, foods, dyes, or preservatives -pregnant or trying to get pregnant -breast-feeding How should I use this medicine? This medicine is for injection under the skin. It is given by a health care professional in a hospital or clinic setting. A special MedGuide will be given to you before each treatment. Be sure to read this information carefully each time. For Prolia, talk to your pediatrician regarding the use of this medicine in children. Special care may be needed. For Xgeva, talk to your pediatrician regarding the use of this medicine in children. While this drug may be prescribed for children as young as 13 years for selected conditions, precautions do apply. Overdosage: If you think you have taken too much of this medicine contact a poison control center or emergency room at once. NOTE: This medicine is only for you. Do not share this medicine with others. What if I miss a dose? It is important not to miss your dose. Call your doctor or health care professional if you are unable to keep an appointment. What may interact with this medicine? Do not take this medicine with any of the following medications: -other medicines containing denosumab This medicine may also interact with the following medications: -medicines that lower your chance of fighting infection -steroid medicines like prednisone or cortisone This list may not describe all   possible interactions. Give your health care provider a list of all the medicines, herbs, non-prescription drugs, or dietary supplements you use. Also tell them if you smoke, drink alcohol, or use illegal drugs. Some items may interact with your medicine. What should I watch for while using this medicine? Visit your doctor or health care professional for regular checks on your progress. Your doctor or health care professional may order  blood tests and other tests to see how you are doing. Call your doctor or health care professional for advice if you get a fever, chills or sore throat, or other symptoms of a cold or flu. Do not treat yourself. This drug may decrease your body's ability to fight infection. Try to avoid being around people who are sick. You should make sure you get enough calcium and vitamin D while you are taking this medicine, unless your doctor tells you not to. Discuss the foods you eat and the vitamins you take with your health care professional. See your dentist regularly. Brush and floss your teeth as directed. Before you have any dental work done, tell your dentist you are receiving this medicine. Do not become pregnant while taking this medicine or for 5 months after stopping it. Talk with your doctor or health care professional about your birth control options while taking this medicine. Women should inform their doctor if they wish to become pregnant or think they might be pregnant. There is a potential for serious side effects to an unborn child. Talk to your health care professional or pharmacist for more information. What side effects may I notice from receiving this medicine? Side effects that you should report to your doctor or health care professional as soon as possible: -allergic reactions like skin rash, itching or hives, swelling of the face, lips, or tongue -bone pain -breathing problems -dizziness -jaw pain, especially after dental work -redness, blistering, peeling of the skin -signs and symptoms of infection like fever or chills; cough; sore throat; pain or trouble passing urine -signs of low calcium like fast heartbeat, muscle cramps or muscle pain; pain, tingling, numbness in the hands or feet; seizures -unusual bleeding or bruising -unusually weak or tired Side effects that usually do not require medical attention (report to your doctor or health care professional if they continue or are  bothersome): -constipation -diarrhea -headache -joint pain -loss of appetite -muscle pain -runny nose -tiredness -upset stomach This list may not describe all possible side effects. Call your doctor for medical advice about side effects. You may report side effects to FDA at 1-800-FDA-1088. Where should I keep my medicine? This medicine is only given in a clinic, doctor's office, or other health care setting and will not be stored at home. NOTE: This sheet is a summary. It may not cover all possible information. If you have questions about this medicine, talk to your doctor, pharmacist, or health care provider.  2019 [employer]/Gold Standard (2017-07-20 16:10:44)  

## 2018-07-03 LAB — PROSTATE-SPECIFIC AG, SERUM (LABCORP): Prostate Specific Ag, Serum: 8.8 ng/mL — ABNORMAL HIGH (ref 0.0–4.0)

## 2018-07-29 ENCOUNTER — Other Ambulatory Visit: Payer: Self-pay | Admitting: *Deleted

## 2018-07-29 DIAGNOSIS — C61 Malignant neoplasm of prostate: Secondary | ICD-10-CM

## 2018-07-29 NOTE — Progress Notes (Signed)
HEMATOLOGY/ONCOLOGY CLINIC NOTE  Date of Service: 07/30/18   Patient Care Team: [name], NP as PCP - General (Internal Medicine)  CHIEF COMPLAINTS/PURPOSE OF CONSULTATION:   F/u for continue mx of metastatic prostate cancer  HISTORY OF PRESENTING ILLNESS:  Eric Osborn. is a wonderful 73 y.o. male who has been referred to [place] from the [facility] long hospital ED by [name] PA-C for evaluation and management of metastatic malignancy concerning for metastatic prostate cancer.  Patient has had a h/o locally advanced prostate cancer diagnosed in 2014 and had a radical prostatectomy and pelvic LN dissection by Dr [name] in 10/2012. Pathology showed pT3b pN0 disease (with positive margins, extraprostatic extension, seminal vesicle involvement and angiolymphatic invasion). Bone scan was negative for metastatic disease. Patient report no post-op RT. He notes that he received lupron shots as per his urologist for 1 year post-operatively. He was being monitored by Dr [name] and last had clinical evaluation and PSA about 67month ago - PSA level not available in our system. He notes that he was lost to f/u since then.  Patient presented to the ED with worsening lower and middle back pain for 2 weeks. Also notes about 15-20lbs weight loss and anorexia over the last 6-8weeks. He also notes hematuria. No fevers/chills. Patient had a CT abd/pelvis in the ED which showed Innumerable sclerotic lesions in all imaged bones consistent metastatic disease. The prostate gland has an irregular appearance and the patient may have prostate carcinoma. A few enlarged retroperitoneal lymph nodes are identified worrisome for additional foci of metastatic disease.  Patient's pain was somewhat controlled with prn percocet in ED and he was discharged home with f/u with [place] Patient notes that the patient wakes him up in the night.  No urinary retention. No loss of bowel or bladder control. No new  neurological symptoms in his extremities.   INTERVAL HISTORY   Eric Osborn is here to follow up of his metastatic prostatic cancer and next Lupron shot. The patient's last visit with [place] on 06/04/18. The pt reports that he is doing well overall.  The pt reports that he has continued with Xtandi and has not missed any doses since our last visit. He endorses some pain in his upper back, but denies this having changed or becoming worse. He notes that this might be related to his yard work. He notes that he has continued with his Vitamin D replacement. He notes that he has been eating too well, and has gained some weight. He denies problems passing urine, abdominal pains, or leg swelling.  Lab results today (07/30/18) of CBC w/diff and CMP is as follows: all values are WNL except for WBC at 13.3k, HGB at 12.5, RDW at 15.6, Lymphs abs at 8.4k, Glucose at 105, Alk Phos at 145. 07/30/18 PSA is 11.5  On review of systems, pt reports eating well, weight gain, mild upper back pain, and denies problems passing urine, abdominal pains, leg swelling, and any other symptoms.   MEDICAL HISTORY:   Past Medical History:  Diagnosis Date  . Arthritis    left knee, left shoulder  . Erectile dysfunction 06/25/2012  . Foley catheter in place   . Goiter 06/25/2012  . H/O hiatal hernia   . Hematuria    occasional  . Hemorrhoid   . History of bladder infections   . Joint pain     SURGICAL HISTORY: Past Surgical History:  Procedure Laterality Date  . INGUINAL  HERNIA REPAIR  8 yrs ago  . LYMPHADENECTOMY Bilateral 11/20/2012   Procedure: WITH BILATERAL PELVIC LYMPH NODE DISSECTION ;  Surgeon: [name], MD;  Location: [facility];  Service: Urology;  Laterality: Bilateral;  . ROBOT ASSISTED LAPAROSCOPIC RADICAL PROSTATECTOMY N/A 11/20/2012   Procedure: ROBOTIC ASSISTED LAPAROSCOPIC RADICAL PROSTATECTOMY;  Surgeon: [name], MD;  Location: [facility];  Service: Urology;  Laterality: N/A;    SOCIAL  HISTORY: Social History   Socioeconomic History  . Marital status: Divorced    Spouse name: Not on file  . Number of children: Not on file  . Years of education: 12  . Highest education level: Not on file  Occupational History  . Occupation: Retired    Comment: Education officer, community  . Financial resource strain: Not on file  . Food insecurity:    Worry: Not on file    Inability: Not on file  . Transportation needs:    Medical: Not on file    Non-medical: Not on file  Tobacco Use  . Smoking status: Former Smoker    Packs/day: 0.25    Years: 25.00    Pack years: 6.25    Types: Cigarettes  . Smokeless tobacco: Former Systems developer  . Tobacco comment: quit date 2016  Substance and Sexual Activity  . Alcohol use: Not Currently    Comment: occasional beer   . Drug use: Yes    Types: Marijuana    Comment: 3 x a month uses marijuana  . Sexual activity: Yes  Lifestyle  . Physical activity:    Days per week: Not on file    Minutes per session: Not on file  . Stress: Not on file  Relationships  . Social connections:    Talks on phone: Not on file    Gets together: Not on file    Attends religious service: Not on file    Active member of club or organization: Not on file    Attends meetings of clubs or organizations: Not on file    Relationship status: Not on file  . Intimate partner violence:    Fear of current or ex partner: Not on file    Emotionally abused: Not on file    Physically abused: Not on file    Forced sexual activity: Not on file  Other Topics Concern  . Not on file  Social History Narrative   Regular exercise-no   Caffeine Use-yes    FAMILY HISTORY: Family History  Problem Relation Age of Onset  . Heart disease Sister   . Stroke Sister   . Colon cancer Paternal Uncle   . Diabetes Neg Hx     ALLERGIES:  is allergic to heparin and poison ivy extract [poison ivy extract].  MEDICATIONS:  Current Outpatient Medications  Medication Sig Dispense Refill  .  denosumab (XGEVA) 120 MG/1.7ML SOLN injection Inject 120 mg into the skin once.    [name] leuprolide (LUPRON) 22.5 MG injection Inject 22.5 mg into the muscle every 3 (three) months.    . mupirocin ointment (BACTROBAN) 2 % Apply 1 application topically 4 (four) times daily. 30 g 0  . oxyCODONE-acetaminophen (PERCOCET) 10-325 MG tablet Take 1 tablet by mouth every 4 (four) hours as needed. for pain 90 tablet 0  . sulfamethoxazole-trimethoprim (BACTRIM DS,SEPTRA DS) 800-160 MG tablet Take 1 tablet by mouth 2 (two) times daily. 14 tablet 0  . Vitamin D, Ergocalciferol, (DRISDOL) 1.25 MG (50000 UT) CAPS capsule Take 1 capsule (50,000 Units total)  by mouth 2 (two) times a week. 24 capsule 6  . XTANDI 40 MG capsule TAKE 4 CAPSULES (160 MG TOTAL) BY MOUTH DAILY. 120 capsule 1   No current facility-administered medications for this visit.     REVIEW OF SYSTEMS:    A 10+ POINT REVIEW OF SYSTEMS WAS OBTAINED including neurology, dermatology, psychiatry, cardiac, respiratory, lymph, extremities, GI, GU, Musculoskeletal, constitutional, breasts, reproductive, HEENT.  All pertinent positives are noted in the HPI.  All others are negative.   PHYSICAL EXAMINATION: ECOG PERFORMANCE STATUS: 2 - Symptomatic, <50% confined to bed  . Vitals:   07/30/18 1141  BP: (!) 116/99  Pulse: 71  Resp: (!) 8  Temp: 97.8 F (36.6 C)  SpO2: 100%   Filed Weights   07/30/18 1141  Weight: 212 lb (96.2 kg)   .Body mass index is 27.97 kg/m. . Wt Readings from Last 3 Encounters:  07/30/18 212 lb (96.2 kg)  06/04/18 207 lb (93.9 kg)  05/07/18 210 lb 4.8 oz (95.4 kg)   GENERAL:alert, in no acute distress and comfortable SKIN: no acute rashes, no significant lesions EYES: conjunctiva are pink and non-injected, sclera anicteric OROPHARYNX: MMM, no exudates, no oropharyngeal erythema or ulceration NECK: supple, no JVD LYMPH:  no palpable lymphadenopathy in the cervical, axillary or inguinal regions LUNGS: clear to  auscultation b/l with normal respiratory effort HEART: regular rate & rhythm ABDOMEN:  normoactive bowel sounds, not distended. No palpable hepatosplenomegaly. Left inguinal hernia that is reducible. Extremity: no pedal edema. TTP of the superior aspect of the left shoulder with significantly limited ROM of left shoulder PSYCH: alert & oriented x 3 with fluent speech NEURO: no focal motor/sensory deficits   LABORATORY DATA:  I have reviewed the data as listed  . CBC Latest Ref Rng & Units 07/30/2018 07/02/2018 06/04/2018  WBC 4.0 - 10.5 K/uL 13.3(H) 11.6(H) 9.2  Hemoglobin 13.0 - 17.0 g/dL 12.5(L) 13.2 13.6  Hematocrit 39.0 - 52.0 % 41.6 42.8 44.5  Platelets 150 - 400 K/uL 229 222 213    CMP Latest Ref Rng & Units 07/30/2018 07/02/2018 06/04/2018  Glucose 70 - 99 mg/dL 105(H) 103(H) 128(H)  BUN 8 - 23 mg/dL _0 Creatinine 0.61 - 1.24 mg/dL 0.76 0.78 0.89  Sodium 135 - 145 mmol/L 139 140 142  Potassium 3.5 - 5.1 mmol/L 4.3 4.1 3.3(L)  Chloride 98 - 111 mmol/L 106 106 106  CO2 22 - 32 mmol/L _1 Calcium 8.9 - 10.3 mg/dL 9.1 9.2 7.6(L)  Total Protein 6.5 - 8.1 g/dL 7.2 7.8 7.5  Total Bilirubin 0.3 - 1.2 mg/dL 0.3 0.4 0.4  Alkaline Phos 38 - 126 U/L 145(H) 146(H) 149(H)  AST 15 - 41 U/L 31 33 31  ALT 0 - 44 U/L _2 RADIOGRAPHIC STUDIES: I have personally reviewed the radiological images as listed and agreed with the findings in the report. No results found.  ASSESSMENT & PLAN:   73 y.o. AAM with previous h/o of locally advanced pT3b,pN0 prostate cancer now with concern for newly noted metastatic prostate cancer.  1) Metastatic Prostate Cancer  PSA level 550 pre-treatment and have improved significantly and were down to 0.6. Now gradually rising again and if upt o  49.7  without any other associated new symptoms. Patient had been noted to have extensive osseous metastatic disease throughout the spine and bilaterally in the calvarium as well. Also noted  to have  local recurrence in the prostatic bed, retroperitoneal Lnadenoapathy and some mediastinal LNadenoapathy.  ECOG PS 1 Previously locally advanced pT3b,pN0 diagnosed in 10/2012 s/p radical prostatectomy and ADT x 1 yr. Testosterone level 7 -- is dropping appropriately with treatment. S/p 6 cycles of taxotere.  12/25/17 PET/CT revealed Solitary retroperitoneal lymph node with radiotracer activity adjacent the IVC most consistent with prostate cancer metastasis. 2. Distant nodal metastasis in the LEFT and RIGHT axilla. Not typical location for prostate cancer metastasis but the activity is intense and favor such. 3. New periosteal reaction within the RIGHT iliac bone and LEFT scapula with intense radiotracer activity consistent active skeletal metastasis. Additional active metastasis in the RIGHT mandible and proximal RIGHT femur.   2) Anterior mandibular pain  Orthopantogram done - showed IMPRESSION: Residual teeth 23-26 with decay.  No mandibular osseous lesion seen Patient has completed dental extractions and is using dentures and   PLAN -Discussed pt labwork today, 07/30/18; blood counts and chemistries are stable -07/30/18 PSA is 11.5 Last available PSA from 07/02/18 was at 8.8 -The pt has no prohibitive toxicities from continuing [place] at this time. Stressed continued medication compliance. -Discussed that I suspect the patient has an inguinal hernia and recommend an abdominal binding belt. Advised that he present to the ED if this gets stuck and painful, and non-reducible. -If back pain worsens then may consider palliative radiation therapy to aid with pain control  -Continue Xgeva every 4 weeks -Continue Lupron every 12 weeks  -Continue 50k Vitamin D twice a week -Will see the pt back in 8 weeks  3) Elevated alkaline phosphatase level due to extensive bone mets.   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 Stable. -would anticipate mild anemia from ADT  5) Hematuria  due to local recurrence of prostate cancer - 1 episode of hematuria - now resolved  6) Anorexia resolved.. Notes improved po intake with progressive weight gain. Patient notes he's been eating much better. . Wt Readings from Last 3 Encounters:  07/30/18 212 lb (96.2 kg)  06/04/18 207 lb (93.9 kg)  05/07/18 210 lb 4.8 oz (95.4 kg)    7) Vit D deficiency with hypocalcemia -- levels are normalizing. -Being aggressively replaced since his calcium levels have dropped after Xgeva likely reflecting significant bone calcium deficits from extensive healing bone lesions. -Continue ergocalciferol 50,000U weekly  -Continue Vit D daily    8) Neoplasm related pain - much improved patient is has not needed much of his prescribed pain medications Plan  -Continue Percocet when necessary for neoplasm related bone pains, refilled today  -Senna S for bowel prophylaxis  9) Colonic thickening in transverse colon thought to be related to spasm/incomplete distension of colon in this area. -would recommend pursuing colonoscopy with PCP --pending  10) Left shoulder pain Patient has left shoulder pain s/p injury 3-4 months ago.  -XR left shoulder today- reviewed results - No fracture or dislocation is noted in the left shoulder. Sclerosis of scapula is noted consistent with metastatic disease. -Referral to orthopedics for left shoulder pain and limited ROM    Continue Lupron q12 weeks -- please schedule for next 4 treatments Continue Xgeva q4weeks - please schedule next 12 treatments RTC with Dr Irene Limbo with labs in 8 weeks   All of the patients questions were answered with apparent satisfaction. The patient knows to call the clinic with any problems, questions or concerns.   The total time spent in the appt was 25 minutes and more than 50% was on counseling  and direct patient cares.   Sullivan Lone MD [place] AAHIVMS [facility] Hematology/Oncology Physician [facility]  (Office):        [phone] (Work cell):  [phone] (Fax):           [phone]  I, Baldwin [place], am acting as a scribe for Dr. Sullivan Lone.   .I have reviewed the above documentation for accuracy and completeness, and I agree with the above. [name] MD

## 2018-07-30 ENCOUNTER — Other Ambulatory Visit: Payer: Self-pay

## 2018-07-30 ENCOUNTER — Inpatient Hospital Stay: Payer: Medicare Other

## 2018-07-30 ENCOUNTER — Inpatient Hospital Stay: Payer: Medicare Other | Attending: Hematology | Admitting: Hematology

## 2018-07-30 ENCOUNTER — Ambulatory Visit: Payer: Self-pay

## 2018-07-30 VITALS — BP 143/83 | HR 68 | Temp 97.8°F | Resp 18

## 2018-07-30 VITALS — BP 116/99 | HR 71 | Temp 97.8°F | Resp 8 | Ht 73.0 in | Wt 212.0 lb

## 2018-07-30 DIAGNOSIS — C7951 Secondary malignant neoplasm of bone: Secondary | ICD-10-CM

## 2018-07-30 DIAGNOSIS — C61 Malignant neoplasm of prostate: Secondary | ICD-10-CM

## 2018-07-30 DIAGNOSIS — G893 Neoplasm related pain (acute) (chronic): Secondary | ICD-10-CM

## 2018-07-30 DIAGNOSIS — Z7189 Other specified counseling: Secondary | ICD-10-CM

## 2018-07-30 LAB — CBC WITH DIFFERENTIAL (CANCER CENTER ONLY)
Abs Immature Granulocytes: 0.03 10*3/uL (ref 0.00–0.07)
Basophils Absolute: 0 10*3/uL (ref 0.0–0.1)
Basophils Relative: 0 %
Eosinophils Absolute: 0.2 10*3/uL (ref 0.0–0.5)
Eosinophils Relative: 1 %
HCT: 41.6 % (ref 39.0–52.0)
Hemoglobin: 12.5 g/dL — ABNORMAL LOW (ref 13.0–17.0)
Immature Granulocytes: 0 %
Lymphocytes Relative: 63 %
Lymphs Abs: 8.4 10*3/uL — ABNORMAL HIGH (ref 0.7–4.0)
MCH: 27.8 pg (ref 26.0–34.0)
MCHC: 30 g/dL (ref 30.0–36.0)
MCV: 92.4 fL (ref 80.0–100.0)
Monocytes Absolute: 0.5 10*3/uL (ref 0.1–1.0)
Monocytes Relative: 4 %
Neutro Abs: 4.2 10*3/uL (ref 1.7–7.7)
Neutrophils Relative %: 32 %
Platelet Count: 229 10*3/uL (ref 150–400)
RBC: 4.5 MIL/uL (ref 4.22–5.81)
RDW: 15.6 % — ABNORMAL HIGH (ref 11.5–15.5)
WBC Count: 13.3 10*3/uL — ABNORMAL HIGH (ref 4.0–10.5)
nRBC: 0 % (ref 0.0–0.2)

## 2018-07-30 LAB — CMP (CANCER CENTER ONLY)
ALT: 7 U/L (ref 0–44)
AST: 31 U/L (ref 15–41)
Albumin: 3.9 g/dL (ref 3.5–5.0)
Alkaline Phosphatase: 145 U/L — ABNORMAL HIGH (ref 38–126)
Anion gap: 9 (ref 5–15)
BUN: 13 mg/dL (ref 8–23)
CO2: 24 mmol/L (ref 22–32)
Calcium: 9.1 mg/dL (ref 8.9–10.3)
Chloride: 106 mmol/L (ref 98–111)
Creatinine: 0.76 mg/dL (ref 0.61–1.24)
GFR, Est AFR Am: >60 mL/min (ref >60)
GFR, Estimated: >60 mL/min (ref >60)
Glucose, Bld: 105 mg/dL — ABNORMAL HIGH (ref 70–99)
Potassium: 4.3 mmol/L (ref 3.5–5.1)
Sodium: 139 mmol/L (ref 135–145)
Total Bilirubin: 0.3 mg/dL (ref 0.3–1.2)
Total Protein: 7.2 g/dL (ref 6.5–8.1)

## 2018-07-30 MED ORDER — DENOSUMAB 120 MG/1.7ML ~~LOC~~ SOLN
120.0000 mg | Freq: Once | SUBCUTANEOUS | Status: AC
  Administered 2018-07-30: 120 mg via SUBCUTANEOUS

## 2018-07-30 MED ORDER — DENOSUMAB 120 MG/1.7ML ~~LOC~~ SOLN
SUBCUTANEOUS | Status: AC
  Filled 2018-07-30: qty 1.7

## 2018-07-30 NOTE — Patient Instructions (Signed)

## 2018-07-31 ENCOUNTER — Telehealth: Payer: Self-pay | Admitting: Hematology

## 2018-07-31 LAB — PROSTATE-SPECIFIC AG, SERUM (LABCORP): Prostate Specific Ag, Serum: 11.5 ng/mL — ABNORMAL HIGH (ref 0.0–4.0)

## 2018-07-31 NOTE — Telephone Encounter (Signed)
Scheduled appt per 5/5 los. ° ° °

## 2018-08-01 ENCOUNTER — Other Ambulatory Visit: Payer: Self-pay | Admitting: Pharmacist

## 2018-08-26 ENCOUNTER — Other Ambulatory Visit: Payer: Self-pay | Admitting: *Deleted

## 2018-08-26 DIAGNOSIS — C61 Malignant neoplasm of prostate: Secondary | ICD-10-CM

## 2018-08-27 ENCOUNTER — Other Ambulatory Visit: Payer: Self-pay

## 2018-08-27 ENCOUNTER — Inpatient Hospital Stay: Payer: Medicare Other | Attending: Hematology

## 2018-08-27 ENCOUNTER — Other Ambulatory Visit: Payer: Self-pay | Admitting: Hematology

## 2018-08-27 ENCOUNTER — Inpatient Hospital Stay: Payer: Medicare Other

## 2018-08-27 VITALS — BP 143/95 | HR 66 | Temp 97.0°F | Resp 18

## 2018-08-27 DIAGNOSIS — Z87891 Personal history of nicotine dependence: Secondary | ICD-10-CM | POA: Diagnosis not present

## 2018-08-27 DIAGNOSIS — Z5111 Encounter for antineoplastic chemotherapy: Secondary | ICD-10-CM | POA: Insufficient documentation

## 2018-08-27 DIAGNOSIS — G893 Neoplasm related pain (acute) (chronic): Secondary | ICD-10-CM | POA: Insufficient documentation

## 2018-08-27 DIAGNOSIS — C7951 Secondary malignant neoplasm of bone: Secondary | ICD-10-CM

## 2018-08-27 DIAGNOSIS — D6481 Anemia due to antineoplastic chemotherapy: Secondary | ICD-10-CM | POA: Diagnosis not present

## 2018-08-27 DIAGNOSIS — Z7189 Other specified counseling: Secondary | ICD-10-CM

## 2018-08-27 DIAGNOSIS — C61 Malignant neoplasm of prostate: Secondary | ICD-10-CM | POA: Insufficient documentation

## 2018-08-27 LAB — CBC WITH DIFFERENTIAL (CANCER CENTER ONLY)
Abs Immature Granulocytes: 0.02 10*3/uL (ref 0.00–0.07)
Basophils Absolute: 0 10*3/uL (ref 0.0–0.1)
Basophils Relative: 0 %
Eosinophils Absolute: 0.2 10*3/uL (ref 0.0–0.5)
Eosinophils Relative: 2 %
HCT: 39.4 % (ref 39.0–52.0)
Hemoglobin: 12.1 g/dL — ABNORMAL LOW (ref 13.0–17.0)
Immature Granulocytes: 0 %
Lymphocytes Relative: 79 %
Lymphs Abs: 9.3 10*3/uL — ABNORMAL HIGH (ref 0.7–4.0)
MCH: 27.8 pg (ref 26.0–34.0)
MCHC: 30.7 g/dL (ref 30.0–36.0)
MCV: 90.6 fL (ref 80.0–100.0)
Monocytes Absolute: 0.5 10*3/uL (ref 0.1–1.0)
Monocytes Relative: 4 %
Neutro Abs: 1.8 10*3/uL (ref 1.7–7.7)
Neutrophils Relative %: 15 %
Platelet Count: 240 10*3/uL (ref 150–400)
RBC: 4.35 MIL/uL (ref 4.22–5.81)
RDW: 14.9 % (ref 11.5–15.5)
WBC Count: 11.8 10*3/uL — ABNORMAL HIGH (ref 4.0–10.5)
nRBC: 0 % (ref 0.0–0.2)

## 2018-08-27 LAB — CMP (CANCER CENTER ONLY)
ALT: 8 U/L (ref 0–44)
AST: 31 U/L (ref 15–41)
Albumin: 3.7 g/dL (ref 3.5–5.0)
Alkaline Phosphatase: 156 U/L — ABNORMAL HIGH (ref 38–126)
Anion gap: 6 (ref 5–15)
BUN: 12 mg/dL (ref 8–23)
CO2: 28 mmol/L (ref 22–32)
Calcium: 9 mg/dL (ref 8.9–10.3)
Chloride: 107 mmol/L (ref 98–111)
Creatinine: 0.7 mg/dL (ref 0.61–1.24)
GFR, Est AFR Am: >60 mL/min (ref >60)
GFR, Estimated: >60 mL/min (ref >60)
Glucose, Bld: 89 mg/dL (ref 70–99)
Potassium: 4 mmol/L (ref 3.5–5.1)
Sodium: 141 mmol/L (ref 135–145)
Total Bilirubin: 0.3 mg/dL (ref 0.3–1.2)
Total Protein: 7 g/dL (ref 6.5–8.1)

## 2018-08-27 MED ORDER — DENOSUMAB 120 MG/1.7ML ~~LOC~~ SOLN
120.0000 mg | Freq: Once | SUBCUTANEOUS | Status: AC
  Administered 2018-08-27: 120 mg via SUBCUTANEOUS

## 2018-08-27 MED ORDER — DENOSUMAB 120 MG/1.7ML ~~LOC~~ SOLN
SUBCUTANEOUS | Status: AC
  Filled 2018-08-27: qty 1.7

## 2018-08-27 MED ORDER — OXYCODONE-ACETAMINOPHEN 10-325 MG PO TABS: 1.0000 | ORAL_TABLET | ORAL | 0 refills | Status: DC | PRN

## 2018-08-28 ENCOUNTER — Telehealth: Payer: Self-pay

## 2018-08-28 NOTE — Telephone Encounter (Signed)
Oral Oncology Patient Advocate Encounter  Guy has tried to reach the patient 3 times to schedule his Xtandi refill with no success.  I called him today and had to leave a voicemail.  [place] [facility] [place] Phone [phone] Fax [phone] 08/28/2018   3:45 PM

## 2018-09-10 ENCOUNTER — Other Ambulatory Visit: Payer: Self-pay | Admitting: Hematology

## 2018-09-10 ENCOUNTER — Telehealth: Payer: Self-pay | Admitting: Pharmacist

## 2018-09-10 DIAGNOSIS — C61 Malignant neoplasm of prostate: Secondary | ICD-10-CM

## 2018-09-10 NOTE — Telephone Encounter (Signed)
Oral Oncology Patient Advocate Encounter:  Attempted to call patient again, was able to schedule patient's Xtandi shipment for 09/11/18 to deliver on 09/12/18 to patient's home.  1:40 PM Beatriz Chancellor, CPhT

## 2018-09-10 NOTE — Telephone Encounter (Signed)
Oral Chemotherapy Pharmacist Encounter   Spoke with patient today in exam room to follow up regarding patient's oral chemotherapy medication: Xtandi(enzalutamide)for the treatment of metastatic, castration resistant prostate cancerin conjunction with androgen deprivation therapy (Lupron injections), planned durationuntil disease progression or unacceptable toxicity.  Original Start date of oral chemotherapy: 01/05/2018  Pt reports 0 doses of Xtandi 40mg  capsules, 4 capsules (160mg ) by mouth once daily without regard to food, missed in the last month.   He has plenty of oatmeal to help with Xtandi administration.  Pt reports the following side effects:  Worsening pain in left shoulder and right hip. These areas correspond to known metastatic bone lesions. Patient questions if he is progression on current therapy and will discuss this with Dr. Irene Limbo at office visit scheduled for 09/24/2018. Patient is having to use more and more pain medications and is experiencing constipation due to this.  Pertinent labs reviewed: OK for continued treatment.  Next fill of Xtandi coordinated with the [facility] outpatient pharmacy to deliver to patient's home address on Thursday, 09/12/2018.  Patient knows to call the office with questions or concerns. Oral Oncology Clinic will continue to follow.  Johny Drilling, PharmD, BCPS, BCOP  09/10/2018  1:58 PM  Oral Oncology Clinic [phone]

## 2018-09-23 NOTE — Progress Notes (Signed)
HEMATOLOGY/ONCOLOGY CLINIC NOTE  Date of Service: 09/24/18   Patient Care Team: [name], NP as PCP - General (Internal Medicine)  CHIEF COMPLAINTS/PURPOSE OF CONSULTATION:   F/u for continue mx of metastatic prostate cancer  HISTORY OF PRESENTING ILLNESS:  Eric Osborn. is a wonderful 73 y.o. male who has been referred to [place] from the [facility] long hospital ED by [name] PA-C for evaluation and management of metastatic malignancy concerning for metastatic prostate cancer.  Patient has had a h/o locally advanced prostate cancer diagnosed in 2014 and had a radical prostatectomy and pelvic LN dissection by Dr [name] in 10/2012. Pathology showed pT3b pN0 disease (with positive margins, extraprostatic extension, seminal vesicle involvement and angiolymphatic invasion). Bone scan was negative for metastatic disease. Patient report no post-op RT. He notes that he received lupron shots as per his urologist for 1 year post-operatively. He was being monitored by Dr [name] and last had clinical evaluation and PSA about 7months ago - PSA level not available in our system. He notes that he was lost to f/u since then.  Patient presented to the ED with worsening lower and middle back pain for 2 weeks. Also notes about 15-20lbs weight loss and anorexia over the last 6-8weeks. He also notes hematuria. No fevers/chills. Patient had a CT abd/pelvis in the ED which showed Innumerable sclerotic lesions in all imaged bones consistent metastatic disease. The prostate gland has an irregular appearance and the patient may have prostate carcinoma. A few enlarged retroperitoneal lymph nodes are identified worrisome for additional foci of metastatic disease.  Patient's pain was somewhat controlled with prn percocet in ED and he was discharged home with f/u with [place]. Patient notes that the patient wakes him up in the night.  No urinary retention. No loss of bowel or bladder control. No new  neurological symptoms in his extremities.   INTERVAL HISTORY   Eric Osborn. is here to follow up of his metastatic prostatic cancer and next Lupron shot. The patient's last visit with [place] was on 07/30/18. The pt reports that he is doing well overall.  The pt reports that he has continued on 160mg  Xtandi daily and has not missed any doses. He notes that he had some increased pain in the interim in his left shoulder. He has also continued on Vitamin D replacement twice each week.   The pt notes that he has been social distancing and denies any concerns for infections at this time.   He also purchased a hernia belt in the interim which has proven helpful for him.  Lab results today (09/24/18) of CBC w/diff is as follows: all values are WNL except for WBC at 12.4k, Lymphs abs at 9.7k. 09/24/18 CMP and PSA is pending  On review of systems, pt reports good energy levels, staying active, eating well, and denies concerns for infections, new abdominal pains, testicular pain or swelling, and any other symptoms.   MEDICAL HISTORY:   Past Medical History:  Diagnosis Date  . Arthritis    left knee, left shoulder  . Erectile dysfunction 06/25/2012  . Foley catheter in place   . Goiter 06/25/2012  . H/O hiatal hernia   . Hematuria    occasional  . Hemorrhoid   . History of bladder infections   . Joint pain     SURGICAL HISTORY: Past Surgical History:  Procedure Laterality Date  . INGUINAL HERNIA REPAIR  8 yrs ago  . LYMPHADENECTOMY Bilateral 11/20/2012  Procedure: WITH BILATERAL PELVIC LYMPH NODE DISSECTION ;  Surgeon: [name], MD;  Location: [facility];  Service: Urology;  Laterality: Bilateral;  . ROBOT ASSISTED LAPAROSCOPIC RADICAL PROSTATECTOMY N/A 11/20/2012   Procedure: ROBOTIC ASSISTED LAPAROSCOPIC RADICAL PROSTATECTOMY;  Surgeon: [name], MD;  Location: [facility];  Service: Urology;  Laterality: N/A;    SOCIAL HISTORY: Social History   Socioeconomic History  . Marital  status: Divorced    Spouse name: Not on file  . Number of children: Not on file  . Years of education: 26  . Highest education level: Not on file  Occupational History  . Occupation: Retired    Comment: Education officer, community  . Financial resource strain: Not on file  . Food insecurity    Worry: Not on file    Inability: Not on file  . Transportation needs    Medical: Not on file    Non-medical: Not on file  Tobacco Use  . Smoking status: Former Smoker    Packs/day: 0.25    Years: 25.00    Pack years: 6.25    Types: Cigarettes  . Smokeless tobacco: Former Systems developer  . Tobacco comment: quit date 2016  Substance and Sexual Activity  . Alcohol use: Not Currently    Comment: occasional beer   . Drug use: Yes    Types: Marijuana    Comment: 3 x a month uses marijuana  . Sexual activity: Yes  Lifestyle  . Physical activity    Days per week: Not on file    Minutes per session: Not on file  . Stress: Not on file  Relationships  . Social Herbalist on phone: Not on file    Gets together: Not on file    Attends religious service: Not on file    Active member of club or organization: Not on file    Attends meetings of clubs or organizations: Not on file    Relationship status: Not on file  . Intimate partner violence    Fear of current or ex partner: Not on file    Emotionally abused: Not on file    Physically abused: Not on file    Forced sexual activity: Not on file  Other Topics Concern  . Not on file  Social History Narrative   Regular exercise-no   Caffeine Use-yes    FAMILY HISTORY: Family History  Problem Relation Age of Onset  . Heart disease Sister   . Stroke Sister   . Colon cancer Paternal Uncle   . Diabetes Neg Hx     ALLERGIES:  is allergic to heparin and poison ivy extract [poison ivy extract].  MEDICATIONS:  Current Outpatient Medications  Medication Sig Dispense Refill  . denosumab (XGEVA) 120 MG/1.7ML SOLN injection Inject 120 mg into the  skin once.    [name] leuprolide (LUPRON) 22.5 MG injection Inject 22.5 mg into the muscle every 3 (three) months.    . mupirocin ointment (BACTROBAN) 2 % Apply 1 application topically 4 (four) times daily. 30 g 0  . oxyCODONE-acetaminophen (PERCOCET) 10-325 MG tablet Take 1 tablet by mouth every 4 (four) hours as needed. for pain 90 tablet 0  . sulfamethoxazole-trimethoprim (BACTRIM DS,SEPTRA DS) 800-160 MG tablet Take 1 tablet by mouth 2 (two) times daily. 14 tablet 0  . Vitamin D, Ergocalciferol, (DRISDOL) 1.25 MG (50000 UT) CAPS capsule Take 1 capsule (50,000 Units total) by mouth 2 (two) times a week. 24 capsule 6  . Gillermina Phy  40 MG capsule TAKE 4 CAPSULES (160 MG TOTAL) BY MOUTH DAILY. 120 capsule 1   No current facility-administered medications for this visit.     REVIEW OF SYSTEMS:    A 10+ POINT REVIEW OF SYSTEMS WAS OBTAINED including neurology, dermatology, psychiatry, cardiac, respiratory, lymph, extremities, GI, GU, Musculoskeletal, constitutional, breasts, reproductive, HEENT.  All pertinent positives are noted in the HPI.  All others are negative.   PHYSICAL EXAMINATION: ECOG PERFORMANCE STATUS: 2 - Symptomatic, <50% confined to bed  . Vitals:   09/24/18 1053  BP: 132/86  Pulse: 87  Resp: 18  Temp: 98.3 F (36.8 C)  SpO2: 99%   Filed Weights   09/24/18 1053  Weight: 210 lb 9.6 oz (95.5 kg)   .Body mass index is 27.79 kg/m. . Wt Readings from Last 3 Encounters:  09/24/18 210 lb 9.6 oz (95.5 kg)  07/30/18 212 lb (96.2 kg)  06/04/18 207 lb (93.9 kg)   GENERAL:alert, in no acute distress and comfortable SKIN: no acute rashes, no significant lesions EYES: conjunctiva are pink and non-injected, sclera anicteric OROPHARYNX: MMM, no exudates, no oropharyngeal erythema or ulceration NECK: supple, no JVD LYMPH:  no palpable lymphadenopathy in the cervical, axillary or inguinal regions LUNGS: clear to auscultation b/l with normal respiratory effort HEART: regular rate &  rhythm ABDOMEN:  normoactive bowel sounds , non tender, not distended. No palpable hepatosplenomegaly. Left inguinal hernia that is reducible. Extremity: no pedal edema. TTP of the superior aspect of the left shoulder with significantly limited ROM of left shoulder PSYCH: alert & oriented x 3 with fluent speech NEURO: no focal motor/sensory deficits   LABORATORY DATA:  I have reviewed the data as listed  . CBC Latest Ref Rng & Units 09/24/2018 08/27/2018 07/30/2018  WBC 4.0 - 10.5 K/uL 12.4(H) 11.8(H) 13.3(H)  Hemoglobin 13.0 - 17.0 g/dL 13.2 12.1(L) 12.5(L)  Hematocrit 39.0 - 52.0 % 42.3 39.4 41.6  Platelets 150 - 400 K/uL 258 240 229    CMP Latest Ref Rng & Units 08/27/2018 07/30/2018 07/02/2018  Glucose 70 - 99 mg/dL 89 105(H) 103(H)  BUN 8 - 23 mg/dL 12 13 11   Creatinine 0.61 - 1.24 mg/dL 0.70 0.76 0.78  Sodium 135 - 145 mmol/L 141 139 140  Potassium 3.5 - 5.1 mmol/L 4.0 4.3 4.1  Chloride 98 - 111 mmol/L 107 106 106  CO2 22 - 32 mmol/L 28 24 25   Calcium 8.9 - 10.3 mg/dL 9.0 9.1 9.2  Total Protein 6.5 - 8.1 g/dL 7.0 7.2 7.8  Total Bilirubin 0.3 - 1.2 mg/dL 0.3 0.3 0.4  Alkaline Phos 38 - 126 U/L 156(H) 145(H) 146(H)  AST 15 - 41 U/L 31 31 33  ALT 0 - 44 U/L 8 7 9             RADIOGRAPHIC STUDIES: I have personally reviewed the radiological images as listed and agreed with the findings in the report. No results found.  ASSESSMENT & PLAN:   74 y.o. AAM with previous h/o of locally advanced pT3b,pN0 prostate cancer now with concern for newly noted metastatic prostate cancer.  1) Metastatic Prostate Cancer  PSA level 550 pre-treatment and have improved significantly and were down to 0.6. Now gradually rising again and if upt o  49.7  without any other associated new symptoms. Patient had been noted to have extensive osseous metastatic disease throughout the spine and bilaterally in the calvarium as well. Also noted to have local recurrence in the prostatic bed, retroperitoneal  Lnadenoapathy and some  mediastinal LNadenoapathy.  ECOG PS 1 Previously locally advanced pT3b,pN0 diagnosed in 10/2012 s/p radical prostatectomy and ADT x 1 yr. Testosterone level 7 -- is dropping appropriately with treatment. S/p 6 cycles of taxotere.  12/25/17 PET/CT revealed Solitary retroperitoneal lymph node with radiotracer activity adjacent the IVC most consistent with prostate cancer metastasis. 2. Distant nodal metastasis in the LEFT and RIGHT axilla. Not typical location for prostate cancer metastasis but the activity is intense and favor such. 3. New periosteal reaction within the RIGHT iliac bone and LEFT scapula with intense radiotracer activity consistent active skeletal metastasis. Additional active metastasis in the RIGHT mandible and proximal RIGHT femur.   2) Anterior mandibular pain  Orthopantogram done - showed IMPRESSION: Residual teeth 23-26 with decay.  No mandibular osseous lesion seen Patient has completed dental extractions and is using dentures and   PLAN -Discussed pt labwork today, 09/24/18; blood counts are stable -09/24/18 PSA is pending. Last available 07/30/18 PSA was at 11.5 -The pt has no prohibitive toxicities from continuing Xtandi at this time. -Proceed with Lupron and Xgeva injections today, 09/24/18 -If PSA continues increasing, will consider repeating imaging -Discussed that I suspect the patient has an inguinal hernia and recommend an abdominal binding belt. Advised that he present to the ED if this gets stuck and painful, and non-reducible. -If back pain worsens then may consider palliative radiation therapy to aid with pain control  -Continue Xgeva every 4 weeks -Continue Lupron every 12 weeks  -Continue 50k Vitamin D twice a week -Recommended that the pt continue to eat well, drink at least 48-64 oz of water each day, and walk 20-30 minutes each day. -Will see the pt back in 8 weeks  3) Elevated alkaline phosphatase level due to extensive bone mets.    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 Stable. -would anticipate mild anemia from ADT  5) Hematuria due to local recurrence of prostate cancer - 1 episode of hematuria - now resolved  6) Anorexia resolved.. Notes improved po intake with progressive weight gain. Patient notes he's been eating much better. . Wt Readings from Last 3 Encounters:  09/24/18 210 lb 9.6 oz (95.5 kg)  07/30/18 212 lb (96.2 kg)  06/04/18 207 lb (93.9 kg)    7) Vit D deficiency with hypocalcemia -- levels are normalizing. -Being aggressively replaced since his calcium levels have dropped after Xgeva likely reflecting significant bone calcium deficits from extensive healing bone lesions. -Continue ergocalciferol 50,000U weekly  -Continue Vit D daily    8) Neoplasm related pain - much improved patient is has not needed much of his prescribed pain medications Plan  -Continue Percocet when necessary for neoplasm related bone pains, refilled today  -Senna S for bowel prophylaxis  9) Colonic thickening in transverse colon thought to be related to spasm/incomplete distension of colon in this area. -would recommend pursuing colonoscopy with PCP --pending  10) Left shoulder pain Patient has left shoulder pain s/p injury 3-4 months ago.  -XR left shoulder today- reviewed results - No fracture or dislocation is noted in the left shoulder. Sclerosis of scapula is noted consistent with metastatic disease. -Referral to orthopedics for left shoulder pain and limited ROM    Continue Lupron q12 weeks -- please schedule for next 4 treatments Continue Xgeva q4weeks - please schedule next 12 treatments RTC with Dr Irene Limbo with labs in 8 weeks   All of the patients questions were answered with apparent satisfaction. The patient knows to call the clinic with any problems,  questions or concerns.   The total time spent in the appt was 20 minutes and more than 50% was on counseling and direct patient cares.    Sullivan Lone MD [place] AAHIVMS [facility] Hematology/Oncology Physician [facility]  (Office):       [phone] (Work cell):  [phone] (Fax):           [phone]  I, Baldwin Jamaica, am acting as a scribe for Dr. Sullivan Lone.   .I have reviewed the above documentation for accuracy and completeness, and I agree with the above. [name] MD

## 2018-09-24 ENCOUNTER — Inpatient Hospital Stay: Payer: Medicare Other

## 2018-09-24 ENCOUNTER — Ambulatory Visit: Payer: Self-pay

## 2018-09-24 ENCOUNTER — Other Ambulatory Visit: Payer: Self-pay | Admitting: Hematology

## 2018-09-24 ENCOUNTER — Telehealth: Payer: Self-pay | Admitting: Hematology

## 2018-09-24 ENCOUNTER — Other Ambulatory Visit: Payer: Self-pay

## 2018-09-24 ENCOUNTER — Inpatient Hospital Stay (HOSPITAL_BASED_OUTPATIENT_CLINIC_OR_DEPARTMENT_OTHER): Payer: Medicare Other | Admitting: Hematology

## 2018-09-24 VITALS — BP 132/86 | HR 87 | Temp 98.3°F | Resp 18 | Ht 73.0 in | Wt 210.6 lb

## 2018-09-24 DIAGNOSIS — Z87891 Personal history of nicotine dependence: Secondary | ICD-10-CM

## 2018-09-24 DIAGNOSIS — C61 Malignant neoplasm of prostate: Secondary | ICD-10-CM

## 2018-09-24 DIAGNOSIS — C7951 Secondary malignant neoplasm of bone: Secondary | ICD-10-CM

## 2018-09-24 DIAGNOSIS — G893 Neoplasm related pain (acute) (chronic): Secondary | ICD-10-CM

## 2018-09-24 DIAGNOSIS — D6481 Anemia due to antineoplastic chemotherapy: Secondary | ICD-10-CM | POA: Diagnosis not present

## 2018-09-24 DIAGNOSIS — Z5111 Encounter for antineoplastic chemotherapy: Secondary | ICD-10-CM | POA: Diagnosis not present

## 2018-09-24 DIAGNOSIS — Z7189 Other specified counseling: Secondary | ICD-10-CM

## 2018-09-24 LAB — CBC WITH DIFFERENTIAL/PLATELET
Abs Immature Granulocytes: 0.02 10*3/uL (ref 0.00–0.07)
Basophils Absolute: 0 10*3/uL (ref 0.0–0.1)
Basophils Relative: 0 %
Eosinophils Absolute: 0.1 10*3/uL (ref 0.0–0.5)
Eosinophils Relative: 1 %
HCT: 42.3 % (ref 39.0–52.0)
Hemoglobin: 13.2 g/dL (ref 13.0–17.0)
Immature Granulocytes: 0 %
Lymphocytes Relative: 78 %
Lymphs Abs: 9.7 10*3/uL — ABNORMAL HIGH (ref 0.7–4.0)
MCH: 28.3 pg (ref 26.0–34.0)
MCHC: 31.2 g/dL (ref 30.0–36.0)
MCV: 90.8 fL (ref 80.0–100.0)
Monocytes Absolute: 0.6 10*3/uL (ref 0.1–1.0)
Monocytes Relative: 5 %
Neutro Abs: 2 10*3/uL (ref 1.7–7.7)
Neutrophils Relative %: 16 %
Platelets: 258 10*3/uL (ref 150–400)
RBC: 4.66 MIL/uL (ref 4.22–5.81)
RDW: 14.6 % (ref 11.5–15.5)
WBC: 12.4 10*3/uL — ABNORMAL HIGH (ref 4.0–10.5)
nRBC: 0 % (ref 0.0–0.2)

## 2018-09-24 LAB — CMP (CANCER CENTER ONLY)
ALT: 11 U/L (ref 0–44)
AST: 35 U/L (ref 15–41)
Albumin: 4.2 g/dL (ref 3.5–5.0)
Alkaline Phosphatase: 178 U/L — ABNORMAL HIGH (ref 38–126)
Anion gap: 8 (ref 5–15)
BUN: 13 mg/dL (ref 8–23)
CO2: 25 mmol/L (ref 22–32)
Calcium: 9.7 mg/dL (ref 8.9–10.3)
Chloride: 105 mmol/L (ref 98–111)
Creatinine: 0.78 mg/dL (ref 0.61–1.24)
GFR, Est AFR Am: >60 mL/min (ref >60)
GFR, Estimated: >60 mL/min (ref >60)
Glucose, Bld: 110 mg/dL — ABNORMAL HIGH (ref 70–99)
Potassium: 4 mmol/L (ref 3.5–5.1)
Sodium: 138 mmol/L (ref 135–145)
Total Bilirubin: 0.4 mg/dL (ref 0.3–1.2)
Total Protein: 7.7 g/dL (ref 6.5–8.1)

## 2018-09-24 MED ORDER — LEUPROLIDE ACETATE (3 MONTH) 22.5 MG IM KIT
PACK | INTRAMUSCULAR | Status: AC
  Filled 2018-09-24: qty 22.5

## 2018-09-24 MED ORDER — LEUPROLIDE ACETATE (3 MONTH) 22.5 MG IM KIT
22.5000 mg | PACK | Freq: Once | INTRAMUSCULAR | Status: AC
  Administered 2018-09-24: 22.5 mg via INTRAMUSCULAR

## 2018-09-24 MED ORDER — VITAMIN D (ERGOCALCIFEROL) 1.25 MG (50000 UNIT) PO CAPS: 50000.0000 [IU] | ORAL_CAPSULE | ORAL | 6 refills | Status: DC

## 2018-09-24 MED ORDER — DENOSUMAB 120 MG/1.7ML ~~LOC~~ SOLN
120.0000 mg | Freq: Once | SUBCUTANEOUS | Status: AC
  Administered 2018-09-24: 120 mg via SUBCUTANEOUS

## 2018-09-24 MED ORDER — DENOSUMAB 120 MG/1.7ML ~~LOC~~ SOLN
SUBCUTANEOUS | Status: AC
  Filled 2018-09-24: qty 1.7

## 2018-09-24 NOTE — Telephone Encounter (Signed)
Scheduled appt per 6/30 los.

## 2018-09-24 NOTE — Patient Instructions (Signed)
Denosumab injection What is this medicine? DENOSUMAB (den oh sue mab) slows bone breakdown. Prolia is used to treat osteoporosis in women after menopause and in men, and in people who are taking corticosteroids for 6 months or more. Xgeva is used to treat a high calcium level due to cancer and to prevent bone fractures and other bone problems caused by multiple myeloma or cancer bone metastases. Xgeva is also used to treat giant cell tumor of the bone. This medicine may be used for other purposes; ask your health care provider or pharmacist if you have questions. COMMON BRAND NAME(S): Prolia, XGEVA What should I tell my health care provider before I take this medicine? They need to know if you have any of these conditions:  dental disease  having surgery or tooth extraction  infection  kidney disease  low levels of calcium or Vitamin D in the blood  malnutrition  on hemodialysis  skin conditions or sensitivity  thyroid or parathyroid disease  an unusual reaction to denosumab, other medicines, foods, dyes, or preservatives  pregnant or trying to get pregnant  breast-feeding How should I use this medicine? This medicine is for injection under the skin. It is given by a health care professional in a hospital or clinic setting. A special MedGuide will be given to you before each treatment. Be sure to read this information carefully each time. For Prolia, talk to your pediatrician regarding the use of this medicine in children. Special care may be needed. For Xgeva, talk to your pediatrician regarding the use of this medicine in children. While this drug may be prescribed for children as young as 13 years for selected conditions, precautions do apply. Overdosage: If you think you have taken too much of this medicine contact a poison control center or emergency room at once. NOTE: This medicine is only for you. Do not share this medicine with others. What if I miss a dose? It is  important not to miss your dose. Call your doctor or health care professional if you are unable to keep an appointment. What may interact with this medicine? Do not take this medicine with any of the following medications:  other medicines containing denosumab This medicine may also interact with the following medications:  medicines that lower your chance of fighting infection  steroid medicines like prednisone or cortisone This list may not describe all possible interactions. Give your health care provider a list of all the medicines, herbs, non-prescription drugs, or dietary supplements you use. Also tell them if you smoke, drink alcohol, or use illegal drugs. Some items may interact with your medicine. What should I watch for while using this medicine? Visit your doctor or health care professional for regular checks on your progress. Your doctor or health care professional may order blood tests and other tests to see how you are doing. Call your doctor or health care professional for advice if you get a fever, chills or sore throat, or other symptoms of a cold or flu. Do not treat yourself. This drug may decrease your body's ability to fight infection. Try to avoid being around people who are sick. You should make sure you get enough calcium and vitamin D while you are taking this medicine, unless your doctor tells you not to. Discuss the foods you eat and the vitamins you take with your health care professional. See your dentist regularly. Brush and floss your teeth as directed. Before you have any dental work done, tell your dentist you are   receiving this medicine. Do not become pregnant while taking this medicine or for 5 months after stopping it. Talk with your doctor or health care professional about your birth control options while taking this medicine. Women should inform their doctor if they wish to become pregnant or think they might be pregnant. There is a potential for serious side  effects to an unborn child. Talk to your health care professional or pharmacist for more information. What side effects may I notice from receiving this medicine? Side effects that you should report to your doctor or health care professional as soon as possible:  allergic reactions like skin rash, itching or hives, swelling of the face, lips, or tongue  bone pain  breathing problems  dizziness  jaw pain, especially after dental work  redness, blistering, peeling of the skin  signs and symptoms of infection like fever or chills; cough; sore throat; pain or trouble passing urine  signs of low calcium like fast heartbeat, muscle cramps or muscle pain; pain, tingling, numbness in the hands or feet; seizures  unusual bleeding or bruising  unusually weak or tired Side effects that usually do not require medical attention (report to your doctor or health care professional if they continue or are bothersome):  constipation  diarrhea  headache  joint pain  loss of appetite  muscle pain  runny nose  tiredness  upset stomach This list may not describe all possible side effects. Call your doctor for medical advice about side effects. You may report side effects to FDA at 1-800-FDA-1088. Where should I keep my medicine? This medicine is only given in a clinic, doctor's office, or other health care setting and will not be stored at home. NOTE: This sheet is a summary. It may not cover all possible information. If you have questions about this medicine, talk to your doctor, pharmacist, or health care provider.  2020 [employer]/Gold Standard (2017-07-20 16:10:44) Leuprolide injection What is this medicine? LEUPROLIDE (loo PROE lide) is a man-made hormone. It is used to treat the symptoms of prostate cancer. This medicine may also be used to treat children with early onset of puberty. It may be used for other hormonal conditions. This medicine may be used for other purposes; ask your  health care provider or pharmacist if you have questions. COMMON BRAND NAME(S): Lupron What should I tell my health care provider before I take this medicine? They need to know if you have any of these conditions:  diabetes  heart disease or previous heart attack  high blood pressure  high cholesterol  pain or difficulty passing urine  spinal cord metastasis  stroke  tobacco smoker  an unusual or allergic reaction to leuprolide, benzyl alcohol, other medicines, foods, dyes, or preservatives  pregnant or trying to get pregnant  breast-feeding How should I use this medicine? This medicine is for injection under the skin or into a muscle. You will be taught how to prepare and give this medicine. Use exactly as directed. Take your medicine at regular intervals. Do not take your medicine more often than directed. It is important that you put your used needles and syringes in a special sharps container. Do not put them in a trash can. If you do not have a sharps container, call your pharmacist or healthcare provider to get one. A special MedGuide will be given to you by the pharmacist with each prescription and refill. Be sure to read this information carefully each time. Talk to your pediatrician regarding the use of  this medicine in children. While this medicine may be prescribed for children as young as 8 years for selected conditions, precautions do apply. Overdosage: If you think you have taken too much of this medicine contact a poison control center or emergency room at once. NOTE: This medicine is only for you. Do not share this medicine with others. What if I miss a dose? If you miss a dose, take it as soon as you can. If it is almost time for your next dose, take only that dose. Do not take double or extra doses. What may interact with this medicine? Do not take this medicine with any of the following medications:  chasteberry This medicine may also interact with the  following medications:  herbal or dietary supplements, like black cohosh or DHEA  male hormones, like estrogens or progestins and birth control pills, patches, rings, or injections  male hormones, like testosterone This list may not describe all possible interactions. Give your health care provider a list of all the medicines, herbs, non-prescription drugs, or dietary supplements you use. Also tell them if you smoke, drink alcohol, or use illegal drugs. Some items may interact with your medicine. What should I watch for while using this medicine? Visit your doctor or health care professional for regular checks on your progress. During the first week, your symptoms may get worse, but then will improve as you continue your treatment. You may get hot flashes, increased bone pain, increased difficulty passing urine, or an aggravation of nerve symptoms. Discuss these effects with your doctor or health care professional, some of them may improve with continued use of this medicine. Male patients may experience a menstrual cycle or spotting during the first 2 months of therapy with this medicine. If this continues, contact your doctor or health care professional. This medicine may increase blood sugar. Ask your healthcare provider if changes in diet or medicines are needed if you have diabetes. What side effects may I notice from receiving this medicine? Side effects that you should report to your doctor or health care professional as soon as possible:  allergic reactions like skin rash, itching or hives, swelling of the face, lips, or tongue  breathing problems  chest pain  depression or memory disorders  pain in your legs or groin  pain at site where injected  severe headache  signs and symptoms of high blood sugar such as being more thirsty or hungry or having to urinate more than normal. You may also feel very tired or have blurry vision  swelling of the feet and legs  visual  changes  vomiting Side effects that usually do not require medical attention (report to your doctor or health care professional if they continue or are bothersome):  breast swelling or tenderness  decrease in sex drive or performance  diarrhea  hot flashes  loss of appetite  muscle, joint, or bone pains  nausea  redness or irritation at site where injected  skin problems or acne This list may not describe all possible side effects. Call your doctor for medical advice about side effects. You may report side effects to FDA at 1-800-FDA-1088. Where should I keep my medicine? Keep out of the reach of children. Store below 25 degrees C (77 degrees F). Do not freeze. Protect from light. Do not use if it is not clear or if there are particles present. Throw away any unused medicine after the expiration date. NOTE: This sheet is a summary. It may not cover all   possible information. If you have questions about this medicine, talk to your doctor, pharmacist, or health care provider.  2020 [employer]vier/Gold Standard (2018-01-10 09:52:48)

## 2018-09-25 ENCOUNTER — Telehealth: Payer: Self-pay | Admitting: *Deleted

## 2018-09-25 LAB — PROSTATE-SPECIFIC AG, SERUM (LABCORP): Prostate Specific Ag, Serum: 23.2 ng/mL — ABNORMAL HIGH (ref 0.0–4.0)

## 2018-09-25 NOTE — Telephone Encounter (Signed)
Patient states meds were not ready to pick up at [place] following his appt yesterday. Patient asked that pharmacy be contacted to request  Vitamin D and pain med be mailed to his home in [place]. [place], spoke with Einar Pheasant in [place] - he stated meds would be prepared and mailed to patient.

## 2018-10-22 ENCOUNTER — Other Ambulatory Visit: Payer: Self-pay

## 2018-10-22 ENCOUNTER — Inpatient Hospital Stay: Payer: Medicare Other | Attending: Hematology

## 2018-10-22 ENCOUNTER — Inpatient Hospital Stay: Payer: Medicare Other

## 2018-10-22 VITALS — BP 145/88 | HR 80 | Temp 98.5°F | Resp 18

## 2018-10-22 DIAGNOSIS — C61 Malignant neoplasm of prostate: Secondary | ICD-10-CM

## 2018-10-22 DIAGNOSIS — C7951 Secondary malignant neoplasm of bone: Secondary | ICD-10-CM

## 2018-10-22 DIAGNOSIS — G893 Neoplasm related pain (acute) (chronic): Secondary | ICD-10-CM

## 2018-10-22 DIAGNOSIS — Z7189 Other specified counseling: Secondary | ICD-10-CM

## 2018-10-22 LAB — CMP (CANCER CENTER ONLY)
ALT: 10 U/L (ref 0–44)
AST: 37 U/L (ref 15–41)
Albumin: 4.2 g/dL (ref 3.5–5.0)
Alkaline Phosphatase: 206 U/L — ABNORMAL HIGH (ref 38–126)
Anion gap: 9 (ref 5–15)
BUN: 13 mg/dL (ref 8–23)
CO2: 26 mmol/L (ref 22–32)
Calcium: 9.6 mg/dL (ref 8.9–10.3)
Chloride: 106 mmol/L (ref 98–111)
Creatinine: 0.77 mg/dL (ref 0.61–1.24)
GFR, Est AFR Am: >60 mL/min (ref >60)
GFR, Estimated: >60 mL/min (ref >60)
Glucose, Bld: 110 mg/dL — ABNORMAL HIGH (ref 70–99)
Potassium: 3.8 mmol/L (ref 3.5–5.1)
Sodium: 141 mmol/L (ref 135–145)
Total Bilirubin: 0.4 mg/dL (ref 0.3–1.2)
Total Protein: 7.8 g/dL (ref 6.5–8.1)

## 2018-10-22 LAB — CBC WITH DIFFERENTIAL/PLATELET
Abs Immature Granulocytes: 0.02 10*3/uL (ref 0.00–0.07)
Basophils Absolute: 0 10*3/uL (ref 0.0–0.1)
Basophils Relative: 0 %
Eosinophils Absolute: 0.2 10*3/uL (ref 0.0–0.5)
Eosinophils Relative: 1 %
HCT: 42.6 % (ref 39.0–52.0)
Hemoglobin: 13.3 g/dL (ref 13.0–17.0)
Immature Granulocytes: 0 %
Lymphocytes Relative: 66 %
Lymphs Abs: 6.9 10*3/uL — ABNORMAL HIGH (ref 0.7–4.0)
MCH: 27.9 pg (ref 26.0–34.0)
MCHC: 31.2 g/dL (ref 30.0–36.0)
MCV: 89.3 fL (ref 80.0–100.0)
Monocytes Absolute: 0.7 10*3/uL (ref 0.1–1.0)
Monocytes Relative: 7 %
Neutro Abs: 2.7 10*3/uL (ref 1.7–7.7)
Neutrophils Relative %: 26 %
Platelets: 239 10*3/uL (ref 150–400)
RBC: 4.77 MIL/uL (ref 4.22–5.81)
RDW: 14.4 % (ref 11.5–15.5)
WBC: 10.6 10*3/uL — ABNORMAL HIGH (ref 4.0–10.5)
nRBC: 0 % (ref 0.0–0.2)

## 2018-10-22 MED ORDER — DENOSUMAB 120 MG/1.7ML ~~LOC~~ SOLN
SUBCUTANEOUS | Status: AC
  Filled 2018-10-22: qty 1.7

## 2018-10-22 MED ORDER — DENOSUMAB 120 MG/1.7ML ~~LOC~~ SOLN
120.0000 mg | Freq: Once | SUBCUTANEOUS | Status: AC
  Administered 2018-10-22: 120 mg via SUBCUTANEOUS

## 2018-10-23 LAB — PSA, TOTAL AND FREE
PSA, Free Pct: 18.2 %
PSA, Free: 5.65 ng/mL
Prostate Specific Ag, Serum: 31.1 ng/mL — ABNORMAL HIGH (ref 0.0–4.0)

## 2018-10-31 ENCOUNTER — Other Ambulatory Visit: Payer: Self-pay | Admitting: Hematology

## 2018-10-31 DIAGNOSIS — C7951 Secondary malignant neoplasm of bone: Secondary | ICD-10-CM

## 2018-11-19 ENCOUNTER — Inpatient Hospital Stay: Payer: Medicare Other

## 2018-11-19 ENCOUNTER — Inpatient Hospital Stay (HOSPITAL_BASED_OUTPATIENT_CLINIC_OR_DEPARTMENT_OTHER): Payer: Medicare Other | Admitting: Hematology

## 2018-11-19 ENCOUNTER — Other Ambulatory Visit: Payer: Self-pay

## 2018-11-19 ENCOUNTER — Other Ambulatory Visit: Payer: Self-pay | Admitting: *Deleted

## 2018-11-19 ENCOUNTER — Ambulatory Visit: Payer: Self-pay

## 2018-11-19 ENCOUNTER — Inpatient Hospital Stay: Payer: Medicare Other | Attending: Hematology

## 2018-11-19 VITALS — BP 142/92 | HR 113 | Temp 98.2°F | Resp 18 | Ht 73.0 in | Wt 204.4 lb

## 2018-11-19 DIAGNOSIS — C7951 Secondary malignant neoplasm of bone: Secondary | ICD-10-CM

## 2018-11-19 DIAGNOSIS — Z87891 Personal history of nicotine dependence: Secondary | ICD-10-CM | POA: Insufficient documentation

## 2018-11-19 DIAGNOSIS — R319 Hematuria, unspecified: Secondary | ICD-10-CM | POA: Insufficient documentation

## 2018-11-19 DIAGNOSIS — C61 Malignant neoplasm of prostate: Secondary | ICD-10-CM

## 2018-11-19 DIAGNOSIS — E559 Vitamin D deficiency, unspecified: Secondary | ICD-10-CM | POA: Insufficient documentation

## 2018-11-19 DIAGNOSIS — G893 Neoplasm related pain (acute) (chronic): Secondary | ICD-10-CM | POA: Diagnosis not present

## 2018-11-19 DIAGNOSIS — Z7189 Other specified counseling: Secondary | ICD-10-CM

## 2018-11-19 DIAGNOSIS — D630 Anemia in neoplastic disease: Secondary | ICD-10-CM | POA: Insufficient documentation

## 2018-11-19 DIAGNOSIS — R748 Abnormal levels of other serum enzymes: Secondary | ICD-10-CM | POA: Insufficient documentation

## 2018-11-19 LAB — CMP (CANCER CENTER ONLY)
ALT: 10 U/L (ref 0–44)
AST: 37 U/L (ref 15–41)
Albumin: 4 g/dL (ref 3.5–5.0)
Alkaline Phosphatase: 239 U/L — ABNORMAL HIGH (ref 38–126)
Anion gap: 6 (ref 5–15)
BUN: 14 mg/dL (ref 8–23)
CO2: 26 mmol/L (ref 22–32)
Calcium: 8.9 mg/dL (ref 8.9–10.3)
Chloride: 104 mmol/L (ref 98–111)
Creatinine: 0.77 mg/dL (ref 0.61–1.24)
GFR, Est AFR Am: >60 mL/min (ref >60)
GFR, Estimated: >60 mL/min (ref >60)
Glucose, Bld: 117 mg/dL — ABNORMAL HIGH (ref 70–99)
Potassium: 4.5 mmol/L (ref 3.5–5.1)
Sodium: 136 mmol/L (ref 135–145)
Total Bilirubin: 0.4 mg/dL (ref 0.3–1.2)
Total Protein: 7.3 g/dL (ref 6.5–8.1)

## 2018-11-19 MED ORDER — DENOSUMAB 120 MG/1.7ML ~~LOC~~ SOLN
120.0000 mg | Freq: Once | SUBCUTANEOUS | Status: AC
  Administered 2018-11-19: 13:00:00 120 mg via SUBCUTANEOUS

## 2018-11-19 MED ORDER — DENOSUMAB 120 MG/1.7ML ~~LOC~~ SOLN
SUBCUTANEOUS | Status: AC
  Filled 2018-11-19: qty 1.7

## 2018-11-19 NOTE — Patient Instructions (Signed)
Denosumab injection What is this medicine? DENOSUMAB (den oh sue mab) slows bone breakdown. Prolia is used to treat osteoporosis in women after menopause and in men, and in people who are taking corticosteroids for 6 months or more. Xgeva is used to treat a high calcium level due to cancer and to prevent bone fractures and other bone problems caused by multiple myeloma or cancer bone metastases. Xgeva is also used to treat giant cell tumor of the bone. This medicine may be used for other purposes; ask your health care provider or pharmacist if you have questions. COMMON BRAND NAME(S): Prolia, XGEVA What should I tell my health care provider before I take this medicine? They need to know if you have any of these conditions:  dental disease  having surgery or tooth extraction  infection  kidney disease  low levels of calcium or Vitamin D in the blood  malnutrition  on hemodialysis  skin conditions or sensitivity  thyroid or parathyroid disease  an unusual reaction to denosumab, other medicines, foods, dyes, or preservatives  pregnant or trying to get pregnant  breast-feeding How should I use this medicine? This medicine is for injection under the skin. It is given by a health care professional in a hospital or clinic setting. A special MedGuide will be given to you before each treatment. Be sure to read this information carefully each time. For Prolia, talk to your pediatrician regarding the use of this medicine in children. Special care may be needed. For Xgeva, talk to your pediatrician regarding the use of this medicine in children. While this drug may be prescribed for children as young as 13 years for selected conditions, precautions do apply. Overdosage: If you think you have taken too much of this medicine contact a poison control center or emergency room at once. NOTE: This medicine is only for you. Do not share this medicine with others. What if I miss a dose? It is  important not to miss your dose. Call your doctor or health care professional if you are unable to keep an appointment. What may interact with this medicine? Do not take this medicine with any of the following medications:  other medicines containing denosumab This medicine may also interact with the following medications:  medicines that lower your chance of fighting infection  steroid medicines like prednisone or cortisone This list may not describe all possible interactions. Give your health care provider a list of all the medicines, herbs, non-prescription drugs, or dietary supplements you use. Also tell them if you smoke, drink alcohol, or use illegal drugs. Some items may interact with your medicine. What should I watch for while using this medicine? Visit your doctor or health care professional for regular checks on your progress. Your doctor or health care professional may order blood tests and other tests to see how you are doing. Call your doctor or health care professional for advice if you get a fever, chills or sore throat, or other symptoms of a cold or flu. Do not treat yourself. This drug may decrease your body's ability to fight infection. Try to avoid being around people who are sick. You should make sure you get enough calcium and vitamin D while you are taking this medicine, unless your doctor tells you not to. Discuss the foods you eat and the vitamins you take with your health care professional. See your dentist regularly. Brush and floss your teeth as directed. Before you have any dental work done, tell your dentist you are   receiving this medicine. Do not become pregnant while taking this medicine or for 5 months after stopping it. Talk with your doctor or health care professional about your birth control options while taking this medicine. Women should inform their doctor if they wish to become pregnant or think they might be pregnant. There is a potential for serious side  effects to an unborn child. Talk to your health care professional or pharmacist for more information. What side effects may I notice from receiving this medicine? Side effects that you should report to your doctor or health care professional as soon as possible:  allergic reactions like skin rash, itching or hives, swelling of the face, lips, or tongue  bone pain  breathing problems  dizziness  jaw pain, especially after dental work  redness, blistering, peeling of the skin  signs and symptoms of infection like fever or chills; cough; sore throat; pain or trouble passing urine  signs of low calcium like fast heartbeat, muscle cramps or muscle pain; pain, tingling, numbness in the hands or feet; seizures  unusual bleeding or bruising  unusually weak or tired Side effects that usually do not require medical attention (report to your doctor or health care professional if they continue or are bothersome):  constipation  diarrhea  headache  joint pain  loss of appetite  muscle pain  runny nose  tiredness  upset stomach This list may not describe all possible side effects. Call your doctor for medical advice about side effects. You may report side effects to FDA at 1-800-FDA-1088. Where should I keep my medicine? This medicine is only given in a clinic, doctor's office, or other health care setting and will not be stored at home. NOTE: This sheet is a summary. It may not cover all possible information. If you have questions about this medicine, talk to your doctor, pharmacist, or health care provider.  2020 [employer]vier/Gold Standard (2017-07-20 16:10:44)

## 2018-11-19 NOTE — Progress Notes (Signed)
HEMATOLOGY/ONCOLOGY CLINIC NOTE  Date of Service: 11/19/18   Patient Care Team: [name], NP as PCP - General (Internal Medicine)  CHIEF COMPLAINTS/PURPOSE OF CONSULTATION:   F/u for continue mx of metastatic prostate cancer  HISTORY OF PRESENTING ILLNESS:  Eric Shade. is a wonderful 73 y.o. male who has been referred to [place] from the [facility] ED by [name] PA-C for evaluation and management of metastatic malignancy concerning for metastatic prostate cancer.  Patient has had a h/o locally advanced prostate cancer diagnosed in 2014 and had a radical prostatectomy and pelvic LN dissection by Dr [name] in 10/2012. Pathology showed pT3b pN0 disease (with positive margins, extraprostatic extension, seminal vesicle involvement and angiolymphatic invasion). Bone scan was negative for metastatic disease. Patient report no post-op RT. He notes that he received lupron shots as per his urologist for 1 year post-operatively. He was being monitored by Dr [name] and last had clinical evaluation and PSA about 69months ago - PSA level not available in our system. He notes that he was lost to f/u since then.  Patient presented to the ED with worsening lower and middle back pain for 2 weeks. Also notes about 15-20lbs weight loss and anorexia over the last 6-8weeks. He also notes hematuria. No fevers/chills. Patient had a CT abd/pelvis in the ED which showed Innumerable sclerotic lesions in all imaged bones consistent metastatic disease. The prostate gland has an irregular appearance and the patient may have prostate carcinoma. A few enlarged retroperitoneal lymph nodes are identified worrisome for additional foci of metastatic disease.  Patient's pain was somewhat controlled with prn percocet in ED and he was discharged home with f/u with [place]. Patient notes that the patient wakes him up in the night.  No urinary retention. No loss of bowel or bladder control. No new  neurological symptoms in his extremities.   INTERVAL HISTORY   Eric Schmick. is here to follow up of his metastatic prostatic cancer and next Lupron shot. The patient's last visit with [place] was on 09/24/2018. The pt reports that he is doing well overall.  The pt reports numbness on the outside of his right leg, that shoots from his right buttock downward that started 2 weeks ago. He only experiences this pain when he sits down. He reports minor back pain. Pt doesn't remember doing any lifting or straining before his symptoms began.   Lab results today (11/19/18) of CMP is as follows: all values are WNL except for Glucose at 117, Alkaline Phosphatase 239. 10/22/2018 PSA total and free is WNL except for Prostate Specific Ag Serum at 31.1.   On review of systems, pt reports new back pain, numbness in right leg, adominal pain due to his hernia and denies any other symptoms.    MEDICAL HISTORY:   Past Medical History:  Diagnosis Date   Arthritis    left knee, left shoulder   Erectile dysfunction 06/25/2012   Foley catheter in place    Goiter 06/25/2012   H/O hiatal hernia    Hematuria    occasional   Hemorrhoid    History of bladder infections    Joint pain     SURGICAL HISTORY: Past Surgical History:  Procedure Laterality Date   INGUINAL HERNIA REPAIR  8 yrs ago   LYMPHADENECTOMY Bilateral 11/20/2012   Procedure: WITH BILATERAL PELVIC LYMPH NODE DISSECTION ;  Surgeon: [name], MD;  Location: [facility];  Service: Urology;  Laterality: Bilateral;  ROBOT ASSISTED LAPAROSCOPIC RADICAL PROSTATECTOMY N/A 11/20/2012   Procedure: ROBOTIC ASSISTED LAPAROSCOPIC RADICAL PROSTATECTOMY;  Surgeon: [name], MD;  Location: [facility];  Service: Urology;  Laterality: N/A;    SOCIAL HISTORY: Social History   Socioeconomic History   Marital status: Divorced    Spouse name: Not on file   Number of children: Not on file   Years of education: 9   Highest education level:  Not on file  Occupational History   Occupation: Retired    Comment: Nurse, adult strain: Not on file   Food insecurity    Worry: Not on file    Inability: Not on Lexicographer needs    Medical: Not on file    Non-medical: Not on file  Tobacco Use   Smoking status: Former Smoker    Packs/day: 0.25    Years: 25.00    Pack years: 6.25    Types: Cigarettes   Smokeless tobacco: Former Systems developer   Tobacco comment: quit date 2016  Substance and Sexual Activity   Alcohol use: Not Currently    Comment: occasional beer    Drug use: Yes    Types: Marijuana    Comment: 3 x a month uses marijuana   Sexual activity: Yes  Lifestyle   Physical activity    Days per week: Not on file    Minutes per session: Not on file   Stress: Not on file  Relationships   Social connections    Talks on phone: Not on file    Gets together: Not on file    Attends religious service: Not on file    Active member of club or organization: Not on file    Attends meetings of clubs or organizations: Not on file    Relationship status: Not on file   Intimate partner violence    Fear of current or ex partner: Not on file    Emotionally abused: Not on file    Physically abused: Not on file    Forced sexual activity: Not on file  Other Topics Concern   Not on file  Social History Narrative   Regular exercise-no   Caffeine Use-yes    FAMILY HISTORY: Family History  Problem Relation Age of Onset   Heart disease Sister    Stroke Sister    Colon cancer Paternal Uncle    Diabetes Neg Hx     ALLERGIES:  is allergic to heparin and poison ivy extract [poison ivy extract].  MEDICATIONS:  Current Outpatient Medications  Medication Sig Dispense Refill   denosumab (XGEVA) 120 MG/1.7ML SOLN injection Inject 120 mg into the skin once.     leuprolide (LUPRON) 22.5 MG injection Inject 22.5 mg into the muscle every 3 (three) months.     mupirocin ointment  (BACTROBAN) 2 % Apply 1 application topically 4 (four) times daily. 30 g 0   oxyCODONE-acetaminophen (PERCOCET) 10-325 MG tablet TAKE 1 TABLET BY MOUTH EVERY 4 HOURS AS NEEDED FOR PAIN 90 tablet 0   sulfamethoxazole-trimethoprim (BACTRIM DS,SEPTRA DS) 800-160 MG tablet Take 1 tablet by mouth 2 (two) times daily. 14 tablet 0   Vitamin D, Ergocalciferol, (DRISDOL) 1.25 MG (50000 UT) CAPS capsule Take 1 capsule (50,000 Units total) by mouth 2 (two) times a week. 24 capsule 6   XTANDI 40 MG capsule TAKE 4 CAPSULES (160 MG TOTAL) BY MOUTH DAILY. 120 capsule 1   No current facility-administered medications for this visit.  REVIEW OF SYSTEMS:    A 10+ POINT REVIEW OF SYSTEMS WAS OBTAINED including neurology, dermatology, psychiatry, cardiac, respiratory, lymph, extremities, GI, GU, Musculoskeletal, constitutional, breasts, reproductive, HEENT.  All pertinent positives are noted in the HPI.  All others are negative.   PHYSICAL EXAMINATION: ECOG PERFORMANCE STATUS: 2 - Symptomatic, <50% confined to bed  . Vitals:   11/19/18 1217  BP: (!) 142/92  Pulse: (!) 113  Resp: 18  Temp: 98.2 F (36.8 C)  SpO2: 100%   Filed Weights   11/19/18 1217  Weight: 204 lb 6.4 oz (92.7 kg)   .Body mass index is 26.97 kg/m. . Wt Readings from Last 3 Encounters:  11/19/18 204 lb 6.4 oz (92.7 kg)  09/24/18 210 lb 9.6 oz (95.5 kg)  07/30/18 212 lb (96.2 kg)   GENERAL:alert, in no acute distress and comfortable SKIN: no acute rashes, no significant lesions EYES: conjunctiva are pink and non-injected, sclera anicteric OROPHARYNX: MMM, no exudates, no oropharyngeal erythema or ulceration NECK: supple, no JVD, left thyroid enlargement LYMPH:  no palpable lymphadenopathy in the cervical, axillary or inguinal regions LUNGS: clear to auscultation b/l with normal respiratory effort HEART: regular rate & rhythm ABDOMEN:  normoactive bowel sounds , non tender, not distended. No palpable hepatosplenomegaly.  Left inguinal hernia.  Extremity: no pedal edema. Pain in right lower back.  PSYCH: alert & oriented x 3 with fluent speech NEURO: no focal motor/sensory deficits   LABORATORY DATA:  I have reviewed the data as listed  . CBC Latest Ref Rng & Units 10/22/2018 09/24/2018 08/27/2018  WBC 4.0 - 10.5 K/uL 10.6(H) 12.4(H) 11.8(H)  Hemoglobin 13.0 - 17.0 g/dL 13.3 13.2 12.1(L)  Hematocrit 39.0 - 52.0 % 42.6 42.3 39.4  Platelets 150 - 400 K/uL 239 258 240    CMP Latest Ref Rng & Units 11/19/2018 10/22/2018 09/24/2018  Glucose 70 - 99 mg/dL 117(H) 110(H) 110(H)  BUN 8 - 23 mg/dL 14 13 13   Creatinine 0.61 - 1.24 mg/dL 0.77 0.77 0.78  Sodium 135 - 145 mmol/L 136 141 138  Potassium 3.5 - 5.1 mmol/L 4.5 3.8 4.0  Chloride 98 - 111 mmol/L 104 106 105  CO2 22 - 32 mmol/L 26 26 25   Calcium 8.9 - 10.3 mg/dL 8.9 9.6 9.7  Total Protein 6.5 - 8.1 g/dL 7.3 7.8 7.7  Total Bilirubin 0.3 - 1.2 mg/dL 0.4 0.4 0.4  Alkaline Phos 38 - 126 U/L 239(H) 206(H) 178(H)  AST 15 - 41 U/L 37 37 35  ALT 0 - 44 U/L 10 10 11             RADIOGRAPHIC STUDIES: I have personally reviewed the radiological images as listed and agreed with the findings in the report. No results found.  ASSESSMENT & PLAN:   73 y.o. AAM with previous h/o of locally advanced pT3b,pN0 prostate cancer now with concern for newly noted metastatic prostate cancer.  1) Metastatic Prostate Cancer  PSA level 550 pre-treatment and have improved significantly and were down to 0.6. Now gradually rising again and if upt o  49.7  without any other associated new symptoms. Patient had been noted to have extensive osseous metastatic disease throughout the spine and bilaterally in the calvarium as well. Also noted to have local recurrence in the prostatic bed, retroperitoneal Lnadenoapathy and some mediastinal LNadenoapathy.  ECOG PS 1 Previously locally advanced pT3b,pN0 diagnosed in 10/2012 s/p radical prostatectomy and ADT x 1 yr. Testosterone  level 7 -- is dropping appropriately with treatment. S/p 6 cycles  of taxotere.  12/25/17 PET/CT revealed Solitary retroperitoneal lymph node with radiotracer activity adjacent the IVC most consistent with prostate cancer metastasis. 2. Distant nodal metastasis in the LEFT and RIGHT axilla. Not typical location for prostate cancer metastasis but the activity is intense and favor such. 3. New periosteal reaction within the RIGHT iliac bone and LEFT scapula with intense radiotracer activity consistent active skeletal metastasis. Additional active metastasis in the RIGHT mandible and proximal RIGHT femur.   2) Anterior mandibular pain  Orthopantogram done - showed IMPRESSION: Residual teeth 23-26 with decay.  No mandibular osseous lesion seen Patient has completed dental extractions and is using dentures and   PLAN  -Discussed pt labwork today, 11/19/18; all values are WNL except for Glucose at 117, Alkaline Phosphatase 239. -Discussed 10/22/2018 PSA total and free is WNL except for Prostate Specific Ag Serum at 31.1, which has been slowly increasing.  -Discussed looking at labs and upcoming PET Scan to reassess his current maintenance Tx plan. -Will repeat full body PET Scan within 1 week -The pt has no prohibitive toxicities from continuing Xtandi at this time. -Continue Xgeva every 4 weeks -Continue Lupron every 12 weeks  -Continue 50k Vitamin D twice a week -Will see back in 2 weeks via phone  3) Elevated alkaline phosphatase level due to extensive bone mets.   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 Stable. -would anticipate mild anemia from ADT  5) Hematuria due to local recurrence of prostate cancer - 1 episode of hematuria - now resolved  6) Anorexia resolved.. Notes improved po intake with progressive weight gain. Patient notes he's been eating much better. . Wt Readings from Last 3 Encounters:  11/19/18 204 lb 6.4 oz (92.7 kg)  09/24/18 210 lb 9.6 oz (95.5 kg)    07/30/18 212 lb (96.2 kg)    7) Vit D deficiency with hypocalcemia -- levels are normalizing. -Being aggressively replaced since his calcium levels have dropped after Xgeva likely reflecting significant bone calcium deficits from extensive healing bone lesions. -Continue ergocalciferol 50,000U weekly  -Continue Vit D daily    8) Neoplasm related pain - much improved patient is has not needed much of his prescribed pain medications Plan  -Continue Percocet when necessary for neoplasm related bone pains, refilled today  -Senna S for bowel prophylaxis  9) Colonic thickening in transverse colon thought to be related to spasm/incomplete distension of colon in this area. -would recommend pursuing colonoscopy with PCP --pending  10) Left shoulder pain Patient has left shoulder pain s/p injury 3-4 months ago.  -XR left shoulder today- reviewed results - No fracture or dislocation is noted in the left shoulder. Sclerosis of scapula is noted consistent with metastatic disease. -Referral to orthopedics for left shoulder pain and limited ROM  FOLLOW UP: PET auximin scan in 1 week  Phone visit with Dr Irene Limbo in 2 weeks Continue Lupron q12 weeks -- please schedule for next 4 treatments Continue Xgeva q4weeks - please schedule next 6 treatments   The total time spent in the appt was 20 minutes and more than 50% was on counseling and direct patient cares.  All of the patient's questions were answered with apparent satisfaction. The patient knows to call the clinic with any problems, questions or concerns.  Sullivan Lone MD [place] AAHIVMS [facility] Hematology/Oncology Physician [facility]  (Office):       [phone] (Work cell):  [phone] (Fax):           [phone]  I,  Yevette Edwards, am acting as a Education administrator for Dr. Sullivan Lone.   .I have reviewed the above documentation for accuracy and completeness, and I agree with the above. [name] MD

## 2018-11-20 ENCOUNTER — Telehealth: Payer: Self-pay | Admitting: Hematology

## 2018-11-20 NOTE — Telephone Encounter (Signed)
Scheduled appt per 8/26 los.  Patient will be informed of his appt date and time.

## 2018-12-03 ENCOUNTER — Other Ambulatory Visit: Payer: Self-pay | Admitting: Hematology

## 2018-12-03 DIAGNOSIS — C7951 Secondary malignant neoplasm of bone: Secondary | ICD-10-CM

## 2018-12-04 ENCOUNTER — Telehealth: Payer: Self-pay | Admitting: Hematology

## 2018-12-04 NOTE — Telephone Encounter (Signed)
Contacted patient to verify telephone visit for pre reg °

## 2018-12-05 ENCOUNTER — Inpatient Hospital Stay: Payer: Medicare Other | Admitting: Hematology

## 2018-12-05 ENCOUNTER — Telehealth: Payer: Self-pay | Admitting: *Deleted

## 2018-12-05 ENCOUNTER — Other Ambulatory Visit: Payer: Self-pay

## 2018-12-05 NOTE — Telephone Encounter (Signed)
Contacted patient at Dr. Grier Osborn request - phone appt for today cancelled by MD.  Appt was to review results of PET scan ordered on 8/25, however PET scan not yet scheduled. PET  Not authorized at this time. Advised patient that once contacted by Radiology Scheduling, he should have PET done as soon as possible. Patient verbalized understanding.

## 2018-12-09 NOTE — Progress Notes (Signed)
This encounter was created in error - please disregard.

## 2018-12-17 ENCOUNTER — Inpatient Hospital Stay: Payer: Medicare Other | Attending: Hematology

## 2018-12-17 ENCOUNTER — Other Ambulatory Visit: Payer: Self-pay

## 2018-12-17 ENCOUNTER — Inpatient Hospital Stay: Payer: Medicare Other

## 2018-12-17 DIAGNOSIS — C7951 Secondary malignant neoplasm of bone: Secondary | ICD-10-CM

## 2018-12-17 DIAGNOSIS — C61 Malignant neoplasm of prostate: Secondary | ICD-10-CM | POA: Insufficient documentation

## 2018-12-17 DIAGNOSIS — Z7189 Other specified counseling: Secondary | ICD-10-CM

## 2018-12-17 DIAGNOSIS — Z5111 Encounter for antineoplastic chemotherapy: Secondary | ICD-10-CM | POA: Diagnosis not present

## 2018-12-17 LAB — CBC WITH DIFFERENTIAL/PLATELET
Abs Immature Granulocytes: 0.04 10*3/uL (ref 0.00–0.07)
Basophils Absolute: 0 10*3/uL (ref 0.0–0.1)
Basophils Relative: 0 %
Eosinophils Absolute: 0.1 10*3/uL (ref 0.0–0.5)
Eosinophils Relative: 1 %
HCT: 39.9 % (ref 39.0–52.0)
Hemoglobin: 12.6 g/dL — ABNORMAL LOW (ref 13.0–17.0)
Immature Granulocytes: 0 %
Lymphocytes Relative: 69 %
Lymphs Abs: 8.6 10*3/uL — ABNORMAL HIGH (ref 0.7–4.0)
MCH: 28.6 pg (ref 26.0–34.0)
MCHC: 31.6 g/dL (ref 30.0–36.0)
MCV: 90.7 fL (ref 80.0–100.0)
Monocytes Absolute: 0.5 10*3/uL (ref 0.1–1.0)
Monocytes Relative: 4 %
Neutro Abs: 3.3 10*3/uL (ref 1.7–7.7)
Neutrophils Relative %: 26 %
Platelets: 280 10*3/uL (ref 150–400)
RBC: 4.4 MIL/uL (ref 4.22–5.81)
RDW: 15.1 % (ref 11.5–15.5)
WBC: 12.5 10*3/uL — ABNORMAL HIGH (ref 4.0–10.5)
nRBC: 0 % (ref 0.0–0.2)

## 2018-12-17 LAB — CMP (CANCER CENTER ONLY)
ALT: 6 U/L (ref 0–44)
AST: 41 U/L (ref 15–41)
Albumin: 4 g/dL (ref 3.5–5.0)
Alkaline Phosphatase: 293 U/L — ABNORMAL HIGH (ref 38–126)
Anion gap: 7 (ref 5–15)
BUN: 15 mg/dL (ref 8–23)
CO2: 25 mmol/L (ref 22–32)
Calcium: 8.6 mg/dL — ABNORMAL LOW (ref 8.9–10.3)
Chloride: 105 mmol/L (ref 98–111)
Creatinine: 0.73 mg/dL (ref 0.61–1.24)
GFR, Est AFR Am: >60 mL/min (ref >60)
GFR, Estimated: >60 mL/min (ref >60)
Glucose, Bld: 92 mg/dL (ref 70–99)
Potassium: 4.4 mmol/L (ref 3.5–5.1)
Sodium: 137 mmol/L (ref 135–145)
Total Bilirubin: 0.4 mg/dL (ref 0.3–1.2)
Total Protein: 7.1 g/dL (ref 6.5–8.1)

## 2018-12-17 MED ORDER — DENOSUMAB 120 MG/1.7ML ~~LOC~~ SOLN
120.0000 mg | Freq: Once | SUBCUTANEOUS | Status: AC
  Administered 2018-12-17: 120 mg via SUBCUTANEOUS

## 2018-12-17 MED ORDER — LEUPROLIDE ACETATE (3 MONTH) 22.5 MG IM KIT
22.5000 mg | PACK | Freq: Once | INTRAMUSCULAR | Status: AC
  Administered 2018-12-17: 22.5 mg via INTRAMUSCULAR

## 2018-12-17 MED ORDER — DENOSUMAB 120 MG/1.7ML ~~LOC~~ SOLN
SUBCUTANEOUS | Status: AC
  Filled 2018-12-17: qty 1.7

## 2018-12-17 NOTE — Patient Instructions (Signed)
Denosumab injection What is this medicine? DENOSUMAB (den oh sue mab) slows bone breakdown. Prolia is used to treat osteoporosis in women after menopause and in men, and in people who are taking corticosteroids for 6 months or more. Xgeva is used to treat a high calcium level due to cancer and to prevent bone fractures and other bone problems caused by multiple myeloma or cancer bone metastases. Xgeva is also used to treat giant cell tumor of the bone. This medicine may be used for other purposes; ask your health care provider or pharmacist if you have questions. COMMON BRAND NAME(S): Prolia, XGEVA What should I tell my health care provider before I take this medicine? They need to know if you have any of these conditions:  dental disease  having surgery or tooth extraction  infection  kidney disease  low levels of calcium or Vitamin D in the blood  malnutrition  on hemodialysis  skin conditions or sensitivity  thyroid or parathyroid disease  an unusual reaction to denosumab, other medicines, foods, dyes, or preservatives  pregnant or trying to get pregnant  breast-feeding How should I use this medicine? This medicine is for injection under the skin. It is given by a health care professional in a hospital or clinic setting. A special MedGuide will be given to you before each treatment. Be sure to read this information carefully each time. For Prolia, talk to your pediatrician regarding the use of this medicine in children. Special care may be needed. For Xgeva, talk to your pediatrician regarding the use of this medicine in children. While this drug may be prescribed for children as young as 13 years for selected conditions, precautions do apply. Overdosage: If you think you have taken too much of this medicine contact a poison control center or emergency room at once. NOTE: This medicine is only for you. Do not share this medicine with others. What if I miss a dose? It is  important not to miss your dose. Call your doctor or health care professional if you are unable to keep an appointment. What may interact with this medicine? Do not take this medicine with any of the following medications:  other medicines containing denosumab This medicine may also interact with the following medications:  medicines that lower your chance of fighting infection  steroid medicines like prednisone or cortisone This list may not describe all possible interactions. Give your health care provider a list of all the medicines, herbs, non-prescription drugs, or dietary supplements you use. Also tell them if you smoke, drink alcohol, or use illegal drugs. Some items may interact with your medicine. What should I watch for while using this medicine? Visit your doctor or health care professional for regular checks on your progress. Your doctor or health care professional may order blood tests and other tests to see how you are doing. Call your doctor or health care professional for advice if you get a fever, chills or sore throat, or other symptoms of a cold or flu. Do not treat yourself. This drug may decrease your body's ability to fight infection. Try to avoid being around people who are sick. You should make sure you get enough calcium and vitamin D while you are taking this medicine, unless your doctor tells you not to. Discuss the foods you eat and the vitamins you take with your health care professional. See your dentist regularly. Brush and floss your teeth as directed. Before you have any dental work done, tell your dentist you are   receiving this medicine. Do not become pregnant while taking this medicine or for 5 months after stopping it. Talk with your doctor or health care professional about your birth control options while taking this medicine. Women should inform their doctor if they wish to become pregnant or think they might be pregnant. There is a potential for serious side  effects to an unborn child. Talk to your health care professional or pharmacist for more information. What side effects may I notice from receiving this medicine? Side effects that you should report to your doctor or health care professional as soon as possible:  allergic reactions like skin rash, itching or hives, swelling of the face, lips, or tongue  bone pain  breathing problems  dizziness  jaw pain, especially after dental work  redness, blistering, peeling of the skin  signs and symptoms of infection like fever or chills; cough; sore throat; pain or trouble passing urine  signs of low calcium like fast heartbeat, muscle cramps or muscle pain; pain, tingling, numbness in the hands or feet; seizures  unusual bleeding or bruising  unusually weak or tired Side effects that usually do not require medical attention (report to your doctor or health care professional if they continue or are bothersome):  constipation  diarrhea  headache  joint pain  loss of appetite  muscle pain  runny nose  tiredness  upset stomach This list may not describe all possible side effects. Call your doctor for medical advice about side effects. You may report side effects to FDA at 1-800-FDA-1088. Where should I keep my medicine? This medicine is only given in a clinic, doctor's office, or other health care setting and will not be stored at home. NOTE: This sheet is a summary. It may not cover all possible information. If you have questions about this medicine, talk to your doctor, pharmacist, or health care provider.  2020 [employer] Standard (2017-07-20 16:10:44) Leuprolide depot injection What is this medicine? LEUPROLIDE (loo PROE lide) is a man-made protein that acts like a natural hormone in the body. It decreases testosterone in men and decreases estrogen in women. In men, this medicine is used to treat advanced prostate cancer. In women, some forms of this medicine may be used  to treat endometriosis, uterine fibroids, or other male hormone-related problems. This medicine may be used for other purposes; ask your health care provider or pharmacist if you have questions. COMMON BRAND NAME(S): Eligard, Fensolv, Lupron Depot, Lupron Depot-Ped, Viadur What should I tell my health care provider before I take this medicine? They need to know if you have any of these conditions:  diabetes  heart disease or previous heart attack  high blood pressure  high cholesterol  mental illness  osteoporosis  pain or difficulty passing urine  seizures  spinal cord metastasis  stroke  suicidal thoughts, plans, or attempt; a previous suicide attempt by you or a family member  tobacco smoker  unusual vaginal bleeding (women)  an unusual or allergic reaction to leuprolide, benzyl alcohol, other medicines, foods, dyes, or preservatives  pregnant or trying to get pregnant  breast-feeding How should I use this medicine? This medicine is for injection into a muscle or for injection under the skin. It is given by a health care professional in a hospital or clinic setting. The specific product will determine how it will be given to you. Make sure you understand which product you receive and how often you will receive it. Talk to your pediatrician regarding the use of  this medicine in children. Special care may be needed. Overdosage: If you think you have taken too much of this medicine contact a poison control center or emergency room at once. NOTE: This medicine is only for you. Do not share this medicine with others. What if I miss a dose? It is important not to miss a dose. Call your doctor or health care professional if you are unable to keep an appointment. Depot injections: Depot injections are given either once-monthly, every 12 weeks, every 16 weeks, or every 24 weeks depending on the product you are prescribed. The product you are prescribed will be based on if you  are male or male, and your condition. Make sure you understand your product and dosing. What may interact with this medicine? Do not take this medicine with any of the following medications:  chasteberry This medicine may also interact with the following medications:  herbal or dietary supplements, like black cohosh or DHEA  male hormones, like estrogens or progestins and birth control pills, patches, rings, or injections  male hormones, like testosterone This list may not describe all possible interactions. Give your health care provider a list of all the medicines, herbs, non-prescription drugs, or dietary supplements you use. Also tell them if you smoke, drink alcohol, or use illegal drugs. Some items may interact with your medicine. What should I watch for while using this medicine? Visit your doctor or health care professional for regular checks on your progress. During the first weeks of treatment, your symptoms may get worse, but then will improve as you continue your treatment. You may get hot flashes, increased bone pain, increased difficulty passing urine, or an aggravation of nerve symptoms. Discuss these effects with your doctor or health care professional, some of them may improve with continued use of this medicine. Male patients may experience a menstrual cycle or spotting during the first months of therapy with this medicine. If this continues, contact your doctor or health care professional. This medicine may increase blood sugar. Ask your healthcare provider if changes in diet or medicines are needed if you have diabetes. What side effects may I notice from receiving this medicine? Side effects that you should report to your doctor or health care professional as soon as possible:  allergic reactions like skin rash, itching or hives, swelling of the face, lips, or tongue  breathing problems  chest pain  depression or memory disorders  pain in your legs or  groin  pain at site where injected or implanted  seizures  severe headache  signs and symptoms of high blood sugar such as being more thirsty or hungry or having to urinate more than normal. You may also feel very tired or have blurry vision  swelling of the feet and legs  suicidal thoughts or other mood changes  visual changes  vomiting Side effects that usually do not require medical attention (report to your doctor or health care professional if they continue or are bothersome):  breast swelling or tenderness  decrease in sex drive or performance  diarrhea  hot flashes  loss of appetite  muscle, joint, or bone pains  nausea  redness or irritation at site where injected or implanted  skin problems or acne This list may not describe all possible side effects. Call your doctor for medical advice about side effects. You may report side effects to FDA at 1-800-FDA-1088. Where should I keep my medicine? This drug is given in a hospital or clinic and will not be  stored at home. NOTE: This sheet is a summary. It may not cover all possible information. If you have questions about this medicine, talk to your doctor, pharmacist, or health care provider.  2020 [employer] Standard (2018-01-10 09:27:03)

## 2018-12-17 NOTE — Progress Notes (Signed)
Verbal order per Dr.Kale: OK to give Xgeva today with Calcium 8.6

## 2018-12-18 LAB — PSA, TOTAL AND FREE
PSA, Free Pct: 15.9 %
PSA, Free: 8.57 ng/mL
Prostate Specific Ag, Serum: 53.9 ng/mL — ABNORMAL HIGH (ref 0.0–4.0)

## 2018-12-20 ENCOUNTER — Ambulatory Visit (HOSPITAL_COMMUNITY): Payer: Medicare Other

## 2019-01-01 ENCOUNTER — Other Ambulatory Visit: Payer: Self-pay | Admitting: Hematology

## 2019-01-01 DIAGNOSIS — C7951 Secondary malignant neoplasm of bone: Secondary | ICD-10-CM

## 2019-01-01 DIAGNOSIS — C61 Malignant neoplasm of prostate: Secondary | ICD-10-CM

## 2019-01-13 NOTE — Progress Notes (Signed)
HEMATOLOGY/ONCOLOGY CLINIC NOTE  Date of Service: 01/14/19   Patient Care Team: [name], NP as PCP - General (Internal Medicine)  CHIEF COMPLAINTS/PURPOSE OF CONSULTATION:   F/u for continue mx of metastatic prostate cancer  HISTORY OF PRESENTING ILLNESS:  Eric Osborn. is a wonderful 73 y.o. male who has been referred to [place] from the [facility] long hospital ED by [name] PA-C for evaluation and management of metastatic malignancy concerning for metastatic prostate cancer.  Patient has had a h/o locally advanced prostate cancer diagnosed in 2014 and had a radical prostatectomy and pelvic LN dissection by Dr [name] in 10/2012. Pathology showed pT3b pN0 disease (with positive margins, extraprostatic extension, seminal vesicle involvement and angiolymphatic invasion). Bone scan was negative for metastatic disease. Patient report no post-op RT. He notes that he received lupron shots as per his urologist for 1 year post-operatively. He was being monitored by Dr [name] and last had clinical evaluation and PSA about 37months ago - PSA level not available in our system. He notes that he was lost to f/u since then.  Patient presented to the ED with worsening lower and middle back pain for 2 weeks. Also notes about 15-20lbs weight loss and anorexia over the last 6-8weeks. He also notes hematuria. No fevers/chills. Patient had a CT abd/pelvis in the ED which showed Innumerable sclerotic lesions in all imaged bones consistent metastatic disease. The prostate gland has an irregular appearance and the patient may have prostate carcinoma. A few enlarged retroperitoneal lymph nodes are identified worrisome for additional foci of metastatic disease.  Patient's pain was somewhat controlled with prn percocet in ED and he was discharged home with f/u with [place]. Patient notes that the patient wakes him up in the night.  No urinary retention. No loss of bowel or bladder control. No new  neurological symptoms in his extremities.   INTERVAL HISTORY   Eric Osborn. is here to follow up of his metastatic prostatic cancer and next Lupron shot. The patient's last visit with [place] was on 11/19/2018. The pt reports that he is doing well overall.  The pt reports that he is experiencing tingling from his mid-thigh to just below his knee in both of his legs as well as low back pain. He denies pushing, pulling, twisting or lifting anything heavy before his back pain began. Pt says that he was told that Radiology was supposed to call him to schedule his PET scan and he never received a call so it was never scheduled. Pt notes that his appetite has been reduced lately and he has been loosing weight. He is experiencing fullness more quickly than normal. Pt has already used the marijuana pill to increase his appetite and did not feel that it helped him but would like to try it again. He has been using Boost supplements. Pt reports that he has continued to take Ergocalciferol as prescribed.   Lab results today (01/14/19) of CBC w/diff and CMP is as follows: all values are WNL except for WBC at 13.5K, Hgb at 12.4, Lymphs Abs at 10.7K, Glucose at 100, Calcium at 8.6, Alkaline Phosphatase at 396. 01/14/2019 PSA total and free is in progress  On review of systems, pt reports back pain, upper leg tingling, loss of appetite, weight loss and denies dysuria, abdominal pain, bowel movement issues and any other symptoms.   MEDICAL HISTORY:   Past Medical History:  Diagnosis Date  . Arthritis    left knee, left  shoulder  . Erectile dysfunction 06/25/2012  . Foley catheter in place   . Goiter 06/25/2012  . H/O hiatal hernia   . Hematuria    occasional  . Hemorrhoid   . History of bladder infections   . Joint pain     SURGICAL HISTORY: Past Surgical History:  Procedure Laterality Date  . INGUINAL HERNIA REPAIR  8 yrs ago  . LYMPHADENECTOMY Bilateral 11/20/2012   Procedure: WITH BILATERAL PELVIC  LYMPH NODE DISSECTION ;  Surgeon: [name], MD;  Location: [facility];  Service: Urology;  Laterality: Bilateral;  . ROBOT ASSISTED LAPAROSCOPIC RADICAL PROSTATECTOMY N/A 11/20/2012   Procedure: ROBOTIC ASSISTED LAPAROSCOPIC RADICAL PROSTATECTOMY;  Surgeon: [name], MD;  Location: [facility];  Service: Urology;  Laterality: N/A;    SOCIAL HISTORY: Social History   Socioeconomic History  . Marital status: Divorced    Spouse name: Not on file  . Number of children: Not on file  . Years of education: 41  . Highest education level: Not on file  Occupational History  . Occupation: Retired    Comment: Education officer, community  . Financial resource strain: Not on file  . Food insecurity    Worry: Not on file    Inability: Not on file  . Transportation needs    Medical: Not on file    Non-medical: Not on file  Tobacco Use  . Smoking status: Former Smoker    Packs/day: 0.25    Years: 25.00    Pack years: 6.25    Types: Cigarettes  . Smokeless tobacco: Former Systems developer  . Tobacco comment: quit date 2016  Substance and Sexual Activity  . Alcohol use: Not Currently    Comment: occasional beer   . Drug use: Yes    Types: Marijuana    Comment: 3 x a month uses marijuana  . Sexual activity: Yes  Lifestyle  . Physical activity    Days per week: Not on file    Minutes per session: Not on file  . Stress: Not on file  Relationships  . Social Herbalist on phone: Not on file    Gets together: Not on file    Attends religious service: Not on file    Active member of club or organization: Not on file    Attends meetings of clubs or organizations: Not on file    Relationship status: Not on file  . Intimate partner violence    Fear of current or ex partner: Not on file    Emotionally abused: Not on file    Physically abused: Not on file    Forced sexual activity: Not on file  Other Topics Concern  . Not on file  Social History Narrative   Regular exercise-no   Caffeine  Use-yes    FAMILY HISTORY: Family History  Problem Relation Age of Onset  . Heart disease Sister   . Stroke Sister   . Colon cancer Paternal Uncle   . Diabetes Neg Hx     ALLERGIES:  is allergic to heparin and poison ivy extract [poison ivy extract].  MEDICATIONS:  Current Outpatient Medications  Medication Sig Dispense Refill  . denosumab (XGEVA) 120 MG/1.7ML SOLN injection Inject 120 mg into the skin once.    [name] leuprolide (LUPRON) 22.5 MG injection Inject 22.5 mg into the muscle every 3 (three) months.    . mupirocin ointment (BACTROBAN) 2 % Apply 1 application topically 4 (four) times daily. 30 g 0  .  oxyCODONE-acetaminophen (PERCOCET) 10-325 MG tablet Take 1 tablet by mouth every 4 (four) hours as needed. for pain 90 tablet 0  . predniSONE (DELTASONE) 20 MG tablet 2 tabs (40mg )  po daily with breakfast for 5 days then 1 tab (20mg ) po daily for 10 days 20 tablet 0  . sulfamethoxazole-trimethoprim (BACTRIM DS,SEPTRA DS) 800-160 MG tablet Take 1 tablet by mouth 2 (two) times daily. 14 tablet 0  . Vitamin D, Ergocalciferol, (DRISDOL) 1.25 MG (50000 UT) CAPS capsule Take 1 capsule (50,000 Units total) by mouth 2 (two) times a week. 24 capsule 6  . XTANDI 40 MG capsule TAKE 4 CAPSULES (160 MG TOTAL) BY MOUTH DAILY. 120 capsule 1   No current facility-administered medications for this visit.     REVIEW OF SYSTEMS:   A 10+ POINT REVIEW OF SYSTEMS WAS OBTAINED including neurology, dermatology, psychiatry, cardiac, respiratory, lymph, extremities, GI, GU, Musculoskeletal, constitutional, breasts, reproductive, HEENT.  All pertinent positives are noted in the HPI.  All others are negative.   PHYSICAL EXAMINATION: ECOG PERFORMANCE STATUS: 2 - Symptomatic, <50% confined to bed  . Vitals:   01/14/19 1128  BP: (!) 144/89  Pulse: 84  Resp: 18  Temp: 98.3 F (36.8 C)  SpO2: 99%   Filed Weights   01/14/19 1128  Weight: 193 lb (87.5 kg)   .Body mass index is 25.46 kg/m. . Wt  Readings from Last 3 Encounters:  01/14/19 193 lb (87.5 kg)  11/19/18 204 lb 6.4 oz (92.7 kg)  09/24/18 210 lb 9.6 oz (95.5 kg)   GENERAL:alert, in no acute distress and comfortable SKIN: no acute rashes, no significant lesions EYES: conjunctiva are pink and non-injected, sclera anicteric OROPHARYNX: MMM, no exudates, no oropharyngeal erythema or ulceration NECK: supple, no JVD LYMPH:  no palpable lymphadenopathy in the cervical, axillary or inguinal regions LUNGS: clear to auscultation b/l with normal respiratory effort HEART: regular rate & rhythm ABDOMEN:  normoactive bowel sounds , non tender, not distended. No palpable hepatosplenomegaly.  Extremity: no pedal edema PSYCH: alert & oriented x 3 with fluent speech NEURO: no focal motor/sensory deficits  LABORATORY DATA:  I have reviewed the data as listed  . CBC Latest Ref Rng & Units 01/14/2019 12/17/2018 10/22/2018  WBC 4.0 - 10.5 K/uL 13.5(H) 12.5(H) 10.6(H)  Hemoglobin 13.0 - 17.0 g/dL 12.4(L) 12.6(L) 13.3  Hematocrit 39.0 - 52.0 % 39.3 39.9 42.6  Platelets 150 - 400 K/uL 309 280 239    CMP Latest Ref Rng & Units 01/14/2019 12/17/2018 11/19/2018  Glucose 70 - 99 mg/dL 100(H) 92 117(H)  BUN 8 - 23 mg/dL 9 15 14   Creatinine 0.61 - 1.24 mg/dL 0.65 0.73 0.77  Sodium 135 - 145 mmol/L 139 137 136  Potassium 3.5 - 5.1 mmol/L 4.3 4.4 4.5  Chloride 98 - 111 mmol/L 104 105 104  CO2 22 - 32 mmol/L 25 25 26   Calcium 8.9 - 10.3 mg/dL 8.6(L) 8.6(L) 8.9  Total Protein 6.5 - 8.1 g/dL 7.1 7.1 7.3  Total Bilirubin 0.3 - 1.2 mg/dL 0.3 0.4 0.4  Alkaline Phos 38 - 126 U/L 396(H) 293(H) 239(H)  AST 15 - 41 U/L 37 41 37  ALT 0 - 44 U/L 7 6 10    .     08/14/2017 Peripheral Blood Flow Cytometry           RADIOGRAPHIC STUDIES: I have personally reviewed the radiological images as listed and agreed with the findings in the report. No results found.  ASSESSMENT & PLAN:  73 y.o. AAM with previous h/o of locally advanced  pT3b,pN0 prostate cancer now with concern for newly noted metastatic prostate cancer.  1) Metastatic Prostate Cancer  PSA level 550 pre-treatment and have improved significantly and were down to 0.6. Now gradually rising again and if upt o  49.7  without any other associated new symptoms. Patient had been noted to have extensive osseous metastatic disease throughout the spine and bilaterally in the calvarium as well. Also noted to have local recurrence in the prostatic bed, retroperitoneal Lnadenoapathy and some mediastinal LNadenoapathy.  ECOG PS 1 Previously locally advanced pT3b,pN0 diagnosed in 10/2012 s/p radical prostatectomy and ADT x 1 yr. Testosterone level 7 -- is dropping appropriately with treatment. S/p 6 cycles of taxotere.  12/25/17 PET/CT revealed Solitary retroperitoneal lymph node with radiotracer activity adjacent the IVC most consistent with prostate cancer metastasis. 2. Distant nodal metastasis in the LEFT and RIGHT axilla. Not typical location for prostate cancer metastasis but the activity is intense and favor such. 3. New periosteal reaction within the RIGHT iliac bone and LEFT scapula with intense radiotracer activity consistent active skeletal metastasis. Additional active metastasis in the RIGHT mandible and proximal RIGHT femur.   2) Anterior mandibular pain  Orthopantogram done - showed IMPRESSION: Residual teeth 23-26 with decay.  No mandibular osseous lesion seen Patient has completed dental extractions and is using dentures and   PLAN: -Discussed pt labwork today, 01/14/19; all values are WNL except for WBC at 13.5K, Hgb at 12.4, Lymphs Abs at 10.7K, Glucose at 100, Calcium at 8.6, Alkaline Phosphatase at 396. -Discussed 01/14/2019 PSA total and free iprogressively increasing suggesting progression of prostate cancer -Advised pt that if recurrence is localized could consider local RT -Will consider placing pt on Abiraterone and Prednisone depending on the  results of the PET scan -The pt has no prohibitive toxicities from continuing [place] at this time. -Continue Xgeva every 4 weeks -Continue Lupron every 12 weeks  -Continue 50k Vitamin D twice a week -Will place pt on a short course of Prednisone for low appetite and back pain  -Will repeat PET Scan in 5 days  -Will see back in 10 days via phone    3) Elevated alkaline phosphatase level due to extensive bone mets.   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 Stable. -would anticipate mild anemia from ADT  5) Hematuria due to local recurrence of prostate cancer - 1 episode of hematuria - now resolved  6) Anorexia resolved.. Notes improved po intake with progressive weight gain. Patient notes he's been eating much better. . Wt Readings from Last 3 Encounters:  01/14/19 193 lb (87.5 kg)  11/19/18 204 lb 6.4 oz (92.7 kg)  09/24/18 210 lb 9.6 oz (95.5 kg)    7) Vit D deficiency with hypocalcemia -- levels are normalizing. -Being aggressively replaced since his calcium levels have dropped after Xgeva likely reflecting significant bone calcium deficits from extensive healing bone lesions. -Continue ergocalciferol 50,000U weekly  -Continue Vit D daily    8) Neoplasm related pain - much improved patient is has not needed much of his prescribed pain medications Plan  -Continue Percocet when necessary for neoplasm related bone pains, refilled today  -Senna S for bowel prophylaxis  9) Colonic thickening in transverse colon thought to be related to spasm/incomplete distension of colon in this area. -would recommend pursuing colonoscopy with PCP --pending  10) Left shoulder pain Patient has left shoulder pain s/p injury 3-4 months ago.  -XR left shoulder today- reviewed  results - No fracture or dislocation is noted in the left shoulder. Sclerosis of scapula is noted consistent with metastatic disease. 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  All of the patient's questions were answered with apparent satisfaction. The patient knows to call the clinic with any problems, questions or concerns.  Sullivan Lone MD [place] AAHIVMS [facility] Hematology/Oncology Physician [facility]  (Office):       [phone] (Work cell):  [phone] (Fax):           [phone]  I, Yevette Edwards, am acting as a scribe for Dr. Sullivan Lone.   .I have reviewed the above documentation for accuracy and completeness, and I agree with the above. [name] MD

## 2019-01-14 ENCOUNTER — Ambulatory Visit: Payer: Medicare Other

## 2019-01-14 ENCOUNTER — Inpatient Hospital Stay: Payer: Medicare Other | Attending: Hematology

## 2019-01-14 ENCOUNTER — Inpatient Hospital Stay (HOSPITAL_BASED_OUTPATIENT_CLINIC_OR_DEPARTMENT_OTHER): Payer: Medicare Other | Admitting: Hematology

## 2019-01-14 ENCOUNTER — Inpatient Hospital Stay: Payer: Medicare Other

## 2019-01-14 ENCOUNTER — Other Ambulatory Visit: Payer: Self-pay

## 2019-01-14 VITALS — BP 144/89 | HR 84 | Temp 98.3°F | Resp 18 | Ht 73.0 in | Wt 193.0 lb

## 2019-01-14 VITALS — BP 157/91 | HR 79 | Temp 98.3°F | Resp 16

## 2019-01-14 DIAGNOSIS — C9110 Chronic lymphocytic leukemia of B-cell type not having achieved remission: Secondary | ICD-10-CM

## 2019-01-14 DIAGNOSIS — C61 Malignant neoplasm of prostate: Secondary | ICD-10-CM

## 2019-01-14 DIAGNOSIS — C7951 Secondary malignant neoplasm of bone: Secondary | ICD-10-CM | POA: Diagnosis not present

## 2019-01-14 DIAGNOSIS — Z7189 Other specified counseling: Secondary | ICD-10-CM

## 2019-01-14 DIAGNOSIS — G893 Neoplasm related pain (acute) (chronic): Secondary | ICD-10-CM

## 2019-01-14 LAB — CBC WITH DIFFERENTIAL/PLATELET
Abs Immature Granulocytes: 0.02 10*3/uL (ref 0.00–0.07)
Basophils Absolute: 0 10*3/uL (ref 0.0–0.1)
Basophils Relative: 0 %
Eosinophils Absolute: 0.3 10*3/uL (ref 0.0–0.5)
Eosinophils Relative: 2 %
HCT: 39.3 % (ref 39.0–52.0)
Hemoglobin: 12.4 g/dL — ABNORMAL LOW (ref 13.0–17.0)
Immature Granulocytes: 0 %
Lymphocytes Relative: 79 %
Lymphs Abs: 10.7 10*3/uL — ABNORMAL HIGH (ref 0.7–4.0)
MCH: 28.6 pg (ref 26.0–34.0)
MCHC: 31.6 g/dL (ref 30.0–36.0)
MCV: 90.6 fL (ref 80.0–100.0)
Monocytes Absolute: 0.5 10*3/uL (ref 0.1–1.0)
Monocytes Relative: 4 %
Neutro Abs: 2 10*3/uL (ref 1.7–7.7)
Neutrophils Relative %: 15 %
Platelets: 309 10*3/uL (ref 150–400)
RBC: 4.34 MIL/uL (ref 4.22–5.81)
RDW: 14.9 % (ref 11.5–15.5)
WBC: 13.5 10*3/uL — ABNORMAL HIGH (ref 4.0–10.5)
nRBC: 0 % (ref 0.0–0.2)

## 2019-01-14 LAB — CMP (CANCER CENTER ONLY)
ALT: 7 U/L (ref 0–44)
AST: 37 U/L (ref 15–41)
Albumin: 3.8 g/dL (ref 3.5–5.0)
Alkaline Phosphatase: 396 U/L — ABNORMAL HIGH (ref 38–126)
Anion gap: 10 (ref 5–15)
BUN: 9 mg/dL (ref 8–23)
CO2: 25 mmol/L (ref 22–32)
Calcium: 8.6 mg/dL — ABNORMAL LOW (ref 8.9–10.3)
Chloride: 104 mmol/L (ref 98–111)
Creatinine: 0.65 mg/dL (ref 0.61–1.24)
GFR, Est AFR Am: >60 mL/min (ref >60)
GFR, Estimated: >60 mL/min (ref >60)
Glucose, Bld: 100 mg/dL — ABNORMAL HIGH (ref 70–99)
Potassium: 4.3 mmol/L (ref 3.5–5.1)
Sodium: 139 mmol/L (ref 135–145)
Total Bilirubin: 0.3 mg/dL (ref 0.3–1.2)
Total Protein: 7.1 g/dL (ref 6.5–8.1)

## 2019-01-14 MED ORDER — OXYCODONE-ACETAMINOPHEN 10-325 MG PO TABS: 1.0000 | ORAL_TABLET | ORAL | 0 refills | Status: DC | PRN

## 2019-01-14 MED ORDER — DENOSUMAB 120 MG/1.7ML ~~LOC~~ SOLN
120.0000 mg | Freq: Once | SUBCUTANEOUS | Status: AC
  Administered 2019-01-14: 120 mg via SUBCUTANEOUS

## 2019-01-14 MED ORDER — PREDNISONE 20 MG PO TABS: ORAL_TABLET | ORAL | 0 refills | Status: DC

## 2019-01-14 NOTE — Progress Notes (Signed)
Per Dr Irene Limbo OK for [facility] with Ca 8.6

## 2019-01-14 NOTE — Patient Instructions (Signed)
Denosumab injection What is this medicine? DENOSUMAB (den oh sue mab) slows bone breakdown. Prolia is used to treat osteoporosis in women after menopause and in men, and in people who are taking corticosteroids for 6 months or more. Xgeva is used to treat a high calcium level due to cancer and to prevent bone fractures and other bone problems caused by multiple myeloma or cancer bone metastases. Xgeva is also used to treat giant cell tumor of the bone. This medicine may be used for other purposes; ask your health care provider or pharmacist if you have questions. COMMON BRAND NAME(S): Prolia, XGEVA What should I tell my health care provider before I take this medicine? They need to know if you have any of these conditions:  dental disease  having surgery or tooth extraction  infection  kidney disease  low levels of calcium or Vitamin D in the blood  malnutrition  on hemodialysis  skin conditions or sensitivity  thyroid or parathyroid disease  an unusual reaction to denosumab, other medicines, foods, dyes, or preservatives  pregnant or trying to get pregnant  breast-feeding How should I use this medicine? This medicine is for injection under the skin. It is given by a health care professional in a hospital or clinic setting. A special MedGuide will be given to you before each treatment. Be sure to read this information carefully each time. For Prolia, talk to your pediatrician regarding the use of this medicine in children. Special care may be needed. For Xgeva, talk to your pediatrician regarding the use of this medicine in children. While this drug may be prescribed for children as young as 13 years for selected conditions, precautions do apply. Overdosage: If you think you have taken too much of this medicine contact a poison control center or emergency room at once. NOTE: This medicine is only for you. Do not share this medicine with others. What if I miss a dose? It is  important not to miss your dose. Call your doctor or health care professional if you are unable to keep an appointment. What may interact with this medicine? Do not take this medicine with any of the following medications:  other medicines containing denosumab This medicine may also interact with the following medications:  medicines that lower your chance of fighting infection  steroid medicines like prednisone or cortisone This list may not describe all possible interactions. Give your health care provider a list of all the medicines, herbs, non-prescription drugs, or dietary supplements you use. Also tell them if you smoke, drink alcohol, or use illegal drugs. Some items may interact with your medicine. What should I watch for while using this medicine? Visit your doctor or health care professional for regular checks on your progress. Your doctor or health care professional may order blood tests and other tests to see how you are doing. Call your doctor or health care professional for advice if you get a fever, chills or sore throat, or other symptoms of a cold or flu. Do not treat yourself. This drug may decrease your body's ability to fight infection. Try to avoid being around people who are sick. You should make sure you get enough calcium and vitamin D while you are taking this medicine, unless your doctor tells you not to. Discuss the foods you eat and the vitamins you take with your health care professional. See your dentist regularly. Brush and floss your teeth as directed. Before you have any dental work done, tell your dentist you are   receiving this medicine. Do not become pregnant while taking this medicine or for 5 months after stopping it. Talk with your doctor or health care professional about your birth control options while taking this medicine. Women should inform their doctor if they wish to become pregnant or think they might be pregnant. There is a potential for serious side  effects to an unborn child. Talk to your health care professional or pharmacist for more information. What side effects may I notice from receiving this medicine? Side effects that you should report to your doctor or health care professional as soon as possible:  allergic reactions like skin rash, itching or hives, swelling of the face, lips, or tongue  bone pain  breathing problems  dizziness  jaw pain, especially after dental work  redness, blistering, peeling of the skin  signs and symptoms of infection like fever or chills; cough; sore throat; pain or trouble passing urine  signs of low calcium like fast heartbeat, muscle cramps or muscle pain; pain, tingling, numbness in the hands or feet; seizures  unusual bleeding or bruising  unusually weak or tired Side effects that usually do not require medical attention (report to your doctor or health care professional if they continue or are bothersome):  constipation  diarrhea  headache  joint pain  loss of appetite  muscle pain  runny nose  tiredness  upset stomach This list may not describe all possible side effects. Call your doctor for medical advice about side effects. You may report side effects to FDA at 1-800-FDA-1088. Where should I keep my medicine? This medicine is only given in a clinic, doctor's office, or other health care setting and will not be stored at home. NOTE: This sheet is a summary. It may not cover all possible information. If you have questions about this medicine, talk to your doctor, pharmacist, or health care provider.  2020 Elsevier/Gold Standard (2017-07-20 16:10:44) COVID-19 Labs  No results for input(s): DDIMER, FERRITIN, LDH, CRP in the last 72 hours.  No results found for: SARSCOV2NAA

## 2019-01-15 ENCOUNTER — Telehealth: Payer: Self-pay | Admitting: Hematology

## 2019-01-15 LAB — PSA, TOTAL AND FREE
PSA, Free Pct: 16.3 %
PSA, Free: 14.2 ng/mL
Prostate Specific Ag, Serum: 86.9 ng/mL — ABNORMAL HIGH (ref 0.0–4.0)

## 2019-01-15 NOTE — Telephone Encounter (Signed)
Scheduled appt per 10/20 los.  Left a VM of the appt date and time and also informing them its a phone visit only in the voice message

## 2019-01-23 ENCOUNTER — Ambulatory Visit (HOSPITAL_COMMUNITY): Admission: RE | Admit: 2019-01-23 | Payer: Medicare Other | Source: Ambulatory Visit

## 2019-01-28 ENCOUNTER — Inpatient Hospital Stay: Payer: Medicare Other | Admitting: Hematology

## 2019-01-29 ENCOUNTER — Telehealth: Payer: Self-pay | Admitting: Hematology

## 2019-01-29 NOTE — Telephone Encounter (Signed)
Scheduled appt per 11/4 sch message - unable to reach pt . Left message with appt date and time   

## 2019-01-30 ENCOUNTER — Other Ambulatory Visit: Payer: Self-pay

## 2019-01-30 ENCOUNTER — Ambulatory Visit (HOSPITAL_COMMUNITY)
Admission: RE | Admit: 2019-01-30 | Discharge: 2019-01-30 | Disposition: A | Discharge status: Home / Self Care | Payer: Medicare Other | Source: Ambulatory Visit | Attending: Hematology | Admitting: Hematology

## 2019-01-30 DIAGNOSIS — C7951 Secondary malignant neoplasm of bone: Secondary | ICD-10-CM | POA: Diagnosis not present

## 2019-01-30 DIAGNOSIS — C61 Malignant neoplasm of prostate: Secondary | ICD-10-CM | POA: Insufficient documentation

## 2019-01-30 MED ORDER — AXUMIN (FLUCICLOVINE F 18) INJECTION
10.6900 | Freq: Once | INTRAVENOUS | Status: AC
  Administered 2019-01-30: 10.69 via INTRAVENOUS

## 2019-02-03 NOTE — Progress Notes (Signed)
This encounter was created in error - please disregard.

## 2019-02-04 ENCOUNTER — Inpatient Hospital Stay: Payer: Medicare Other | Attending: Hematology | Admitting: Hematology

## 2019-02-04 DIAGNOSIS — C61 Malignant neoplasm of prostate: Secondary | ICD-10-CM | POA: Diagnosis not present

## 2019-02-04 DIAGNOSIS — C7951 Secondary malignant neoplasm of bone: Secondary | ICD-10-CM | POA: Diagnosis not present

## 2019-02-04 DIAGNOSIS — G893 Neoplasm related pain (acute) (chronic): Secondary | ICD-10-CM | POA: Diagnosis not present

## 2019-02-04 MED ORDER — ABIRATERONE ACETATE 250 MG PO TABS: 1000.0000 mg | ORAL_TABLET | Freq: Every day | ORAL | 1 refills | Status: DC

## 2019-02-04 MED ORDER — SENNOSIDES-DOCUSATE SODIUM 8.6-50 MG PO TABS: 2.0000 | ORAL_TABLET | Freq: Every day | ORAL | 1 refills | Status: DC

## 2019-02-04 MED ORDER — PREDNISONE 5 MG PO TABS: 5.0000 mg | ORAL_TABLET | Freq: Every day | ORAL | 2 refills | Status: DC

## 2019-02-04 MED ORDER — FENTANYL 12 MCG/HR TD PT72: 1.0000 | MEDICATED_PATCH | TRANSDERMAL | 0 refills | Status: DC

## 2019-02-04 MED ORDER — OXYCODONE-ACETAMINOPHEN 10-325 MG PO TABS: 1.0000 | ORAL_TABLET | ORAL | 0 refills | Status: DC | PRN

## 2019-02-04 NOTE — Progress Notes (Signed)
HEMATOLOGY/ONCOLOGY CLINIC NOTE  Date of Service: 02/04/19   Patient Care Team: [name], NP as PCP - General (Internal Medicine)  CHIEF COMPLAINTS/PURPOSE OF CONSULTATION:   F/u for continue mx of metastatic prostate cancer  HISTORY OF PRESENTING ILLNESS:  Eric Osborn. is a wonderful 73 y.o. male who has been referred to [place] from the [facility] ED by [name] PA-C for evaluation and management of metastatic malignancy concerning for metastatic prostate cancer.  Patient has had a h/o locally advanced prostate cancer diagnosed in 2014 and had a radical prostatectomy and pelvic LN dissection by Dr [name] in 10/2012. Pathology showed pT3b pN0 disease (with positive margins, extraprostatic extension, seminal vesicle involvement and angiolymphatic invasion). Bone scan was negative for metastatic disease. Patient report no post-op RT. He notes that he received lupron shots as per his urologist for 1 year post-operatively. He was being monitored by Dr [name] and last had clinical evaluation and PSA about 43months ago - PSA level not available in our system. He notes that he was lost to f/u since then.  Patient presented to the ED with worsening lower and middle back pain for 2 weeks. Also notes about 15-20lbs weight loss and anorexia over the last 6-8weeks. He also notes hematuria. No fevers/chills. Patient had a CT abd/pelvis in the ED which showed Innumerable sclerotic lesions in all imaged bones consistent metastatic disease. The prostate gland has an irregular appearance and the patient may have prostate carcinoma. A few enlarged retroperitoneal lymph nodes are identified worrisome for additional foci of metastatic disease.  Patient's pain was somewhat controlled with prn percocet in ED and he was discharged home with f/u with [place]. Patient notes that the patient wakes him up in the night.  No urinary retention. No loss of bowel or bladder control. No new  neurological symptoms in his extremities.   INTERVAL HISTORY   I connected with  Minna Merritts. on 02/04/19 by telephone and verified that I am speaking with the correct person using two identifiers.   I discussed the limitations of evaluation and management by telemedicine. The patient expressed understanding and agreed to proceed.  Other persons participating in the visit and their role in the encounter:     -Yevette Edwards, Medical Scribe  Patient's location: Home Provider's location: [facility] at [place]. is here to follow up of his metastatic prostatic cancer. The patient's last visit with [place] was on 01/14/2019. The pt reports that he is doing well overall.  The pt reports that he has continued having back pain, just above his buttocks. Pt is currently taking a Percocet about every 2 hours, for a total of 5-6 per day. He notes that he likes to use a heating pad to help his lower back pain and wants to continue doing so.   Of note since the patient's last visit, pt has had PET/CT ([id number]) completed on 01/30/2019 with results revealing "1. Dominant finding is increase in robust periosteal reaction involving the LEFT scapula and clavicle as well as the RIGHT hemipelvis. The periosteal reaction is increased by 1-2 cm. There is persistent peripheral radiotracer activity associated with the periosteal reaction consistent with progression of active prostate cancer skeletal metastasis. Additional foci of sclerosis are noted in the pelvis and spine. 2. Radiotracer activity of the retroperitoneal lymph node and axillary lymph nodes is decreased suggesting positive response within lymph nodes."  Lab results (01/14/19) of  CBC w/diff and CMP is as follows: all values are WNL except for WBC at 13.5K, Hgb at 12.4, Lymphs Abs at 10.7K, Glucose at 100, Calcium at 8.6, Alkaline Phosphatase at 396. 01/14/2019 PSA Free at 14.20, PSA Free Pct at 16.3. PSA serum at  86.9  On review of systems, pt reports lower back pain and denies any other symptoms.   MEDICAL HISTORY:   Past Medical History:  Diagnosis Date   Arthritis    left knee, left shoulder   Erectile dysfunction 06/25/2012   Foley catheter in place    Goiter 06/25/2012   H/O hiatal hernia    Hematuria    occasional   Hemorrhoid    History of bladder infections    Joint pain     SURGICAL HISTORY: Past Surgical History:  Procedure Laterality Date   INGUINAL HERNIA REPAIR  8 yrs ago   LYMPHADENECTOMY Bilateral 11/20/2012   Procedure: WITH BILATERAL PELVIC LYMPH NODE DISSECTION ;  Surgeon: [name], MD;  Location: [facility];  Service: Urology;  Laterality: Bilateral;   ROBOT ASSISTED LAPAROSCOPIC RADICAL PROSTATECTOMY N/A 11/20/2012   Procedure: ROBOTIC ASSISTED LAPAROSCOPIC RADICAL PROSTATECTOMY;  Surgeon: [name], MD;  Location: [facility];  Service: Urology;  Laterality: N/A;    SOCIAL HISTORY: Social History   Socioeconomic History   Marital status: Divorced    Spouse name: Not on file   Number of children: Not on file   Years of education: 9   Highest education level: Not on file  Occupational History   Occupation: Retired    Comment: Nurse, adult strain: Not on file   Food insecurity    Worry: Not on file    Inability: Not on Lexicographer needs    Medical: Not on file    Non-medical: Not on file  Tobacco Use   Smoking status: Former Smoker    Packs/day: 0.25    Years: 25.00    Pack years: 6.25    Types: Cigarettes   Smokeless tobacco: Former Systems developer   Tobacco comment: quit date 2016  Substance and Sexual Activity   Alcohol use: Not Currently    Comment: occasional beer    Drug use: Yes    Types: Marijuana    Comment: 3 x a month uses marijuana   Sexual activity: Yes  Lifestyle   Physical activity    Days per week: Not on file    Minutes per session: Not on file   Stress: Not on file    Relationships   Social connections    Talks on phone: Not on file    Gets together: Not on file    Attends religious service: Not on file    Active member of club or organization: Not on file    Attends meetings of clubs or organizations: Not on file    Relationship status: Not on file   Intimate partner violence    Fear of current or ex partner: Not on file    Emotionally abused: Not on file    Physically abused: Not on file    Forced sexual activity: Not on file  Other Topics Concern   Not on file  Social History Narrative   Regular exercise-no   Caffeine Use-yes    FAMILY HISTORY: Family History  Problem Relation Age of Onset   Heart disease Sister    Stroke Sister    Colon cancer Paternal Uncle    Diabetes Neg  Hx     ALLERGIES:  is allergic to heparin and poison ivy extract [poison ivy extract].  MEDICATIONS:  Current Outpatient Medications  Medication Sig Dispense Refill   abiraterone acetate (ZYTIGA) 250 MG tablet Take 4 tablets (1,000 mg total) by mouth daily. Take on an empty stomach 1 hour before or 2 hours after a meal 120 tablet 1   denosumab (XGEVA) 120 MG/1.7ML SOLN injection Inject 120 mg into the skin once.     fentaNYL (DURAGESIC) 12 MCG/HR Place 1 patch onto the skin every 3 (three) days. 10 patch 0   leuprolide (LUPRON) 22.5 MG injection Inject 22.5 mg into the muscle every 3 (three) months.     mupirocin ointment (BACTROBAN) 2 % Apply 1 application topically 4 (four) times daily. 30 g 0   oxyCODONE-acetaminophen (PERCOCET) 10-325 MG tablet Take 1 tablet by mouth every 4 (four) hours as needed. for pain 90 tablet 0   predniSONE (DELTASONE) 5 MG tablet Take 1 tablet (5 mg total) by mouth daily with breakfast. 30 tablet 2   senna-docusate (SENNA S) 8.6-50 MG tablet Take 2 tablets by mouth at bedtime. 60 tablet 1   Vitamin D, Ergocalciferol, (DRISDOL) 1.25 MG (50000 UT) CAPS capsule Take 1 capsule (50,000 Units total) by mouth 2 (two) times a  week. 24 capsule 6   No current facility-administered medications for this visit.     REVIEW OF SYSTEMS:   A 10+ POINT REVIEW OF SYSTEMS WAS OBTAINED including neurology, dermatology, psychiatry, cardiac, respiratory, lymph, extremities, GI, GU, Musculoskeletal, constitutional, breasts, reproductive, HEENT.  All pertinent positives are noted in the HPI.  All others are negative.   PHYSICAL EXAMINATION: ECOG PERFORMANCE STATUS: 2 - Symptomatic, <50% confined to bed  . There were no vitals filed for this visit. There were no vitals filed for this visit. .There is no height or weight on file to calculate BMI. . Wt Readings from Last 3 Encounters:  01/14/19 193 lb (87.5 kg)  11/19/18 204 lb 6.4 oz (92.7 kg)  09/24/18 210 lb 9.6 oz (95.5 kg)   Telehealth visit  LABORATORY DATA:  I have reviewed the data as listed  . CBC Latest Ref Rng & Units 01/14/2019 12/17/2018 10/22/2018  WBC 4.0 - 10.5 K/uL 13.5(H) 12.5(H) 10.6(H)  Hemoglobin 13.0 - 17.0 g/dL 12.4(L) 12.6(L) 13.3  Hematocrit 39.0 - 52.0 % 39.3 39.9 42.6  Platelets 150 - 400 K/uL 309 280 239    CMP Latest Ref Rng & Units 01/14/2019 12/17/2018 11/19/2018  Glucose 70 - 99 mg/dL 100(H) 92 117(H)  BUN 8 - 23 mg/dL 9 15 14   Creatinine 0.61 - 1.24 mg/dL 0.65 0.73 0.77  Sodium 135 - 145 mmol/L 139 137 136  Potassium 3.5 - 5.1 mmol/L 4.3 4.4 4.5  Chloride 98 - 111 mmol/L 104 105 104  CO2 22 - 32 mmol/L 25 25 26   Calcium 8.9 - 10.3 mg/dL 8.6(L) 8.6(L) 8.9  Total Protein 6.5 - 8.1 g/dL 7.1 7.1 7.3  Total Bilirubin 0.3 - 1.2 mg/dL 0.3 0.4 0.4  Alkaline Phos 38 - 126 U/L 396(H) 293(H) 239(H)  AST 15 - 41 U/L 37 41 37  ALT 0 - 44 U/L 7 6 10    .     08/14/2017 Peripheral Blood Flow Cytometry           RADIOGRAPHIC STUDIES: I have personally reviewed the radiological images as listed and agreed with the findings in the report. Nm Pet (axumin) Skull Base To Mid Thigh  Result Date:  01/31/2019 CLINICAL DATA:  Subsequent  treatment strategy for prostate carcinoma. CT straight resistant prostate carcinoma. Post systemic therapy. Increasing PSA levels. EXAM: NUCLEAR MEDICINE PET SKULL BASE TO THIGH TECHNIQUE: 10.7 mCi F-18 Fluciclovine was injected intravenously. Full-ring PET imaging was performed from the skull base to thigh after the radiotracer. CT data was obtained and used for attenuation correction and anatomic localization. COMPARISON:  Fluciclovine PET scan 12/25/2017 FINDINGS: NECK No radiotracer activity in neck lymph nodes. Incidental CT finding: Bulky enlargement of the of the LEFT lobe of the thyroid gland measuring up to 6 cm unchanged from prior. Findings consistent with benign goiter CHEST Interval resolution of the radiotracer activity in small axillary lymph nodes. Normal size lymph nodes now have very low radiotracer activity with SUV max equal 1.1 similar to background. Incidental CT finding: No suspicious nodules. ABDOMEN/PELVIS Prostate: No focal activity in the prostate bed. Post prostatectomy. Lymph nodes: The previous described the radiotracer activity associated with a retroperitoneal lymph node RIGHT aorta beneath the IVC is no longer appreciated. The node is not readily measurable and no radiotracer activity. Liver: No evidence of liver metastasis Incidental CT finding: None SKELETON Persistent increased radiotracer activity associated with the periosteal reaction involving the RIGHT iliac wing with SUV max equal 5.3. This is slightly decreased from SUV max equal 6.4 on comparison exam. However the thickness of the periosteal reaction has increased. For example densely sclerotic periosteal reaction in the RIGHT iliac wing measures 23 mm (image 17/4) compared to 16 mm. There is also increase in sclerosis within the sacrum with moderate radiotracer activity (SUV max equal 4.3). Similar findings in the LEFT scapula with the thickness of the bone increased to 4.4 cm from 1.3 cm. (Image 46/4). The periphery of  these periosteal reaction is avid for radiotracer with SUV max equal 3.9. similar findings in school sclerotic enlargement of the LEFT clavicle. IMPRESSION: 1. Dominant finding is increase in robust periosteal reaction involving the LEFT scapula and clavicle as well as the RIGHT hemipelvis. The periosteal reaction is increased by 1-2 cm. There is persistent peripheral radiotracer activity associated with the periosteal reaction consistent with progression of active prostate cancer skeletal metastasis. Additional foci of sclerosis are noted in the pelvis and spine. 2. Radiotracer activity of the retroperitoneal lymph node and axillary lymph nodes is decreased suggesting positive response within lymph nodes. Electronically Signed   By: Suzy Bouchard M.D.   On: 01/31/2019 10:27    ASSESSMENT & PLAN:   73 y.o. AAM with previous h/o of locally advanced pT3b,pN0 prostate cancer now with concern for newly noted metastatic prostate cancer.  1) Metastatic Prostate Cancer  PSA level 550 pre-treatment and have improved significantly and were down to 0.6. Now gradually rising again and if upt o  49.7  without any other associated new symptoms. Patient had been noted to have extensive osseous metastatic disease throughout the spine and bilaterally in the calvarium as well. Also noted to have local recurrence in the prostatic bed, retroperitoneal Lnadenoapathy and some mediastinal LNadenoapathy.  ECOG PS 1 Previously locally advanced pT3b,pN0 diagnosed in 10/2012 s/p radical prostatectomy and ADT x 1 yr. Testosterone level 7 -- is dropping appropriately with treatment. S/p 6 cycles of taxotere.  12/25/17 PET/CT revealed Solitary retroperitoneal lymph node with radiotracer activity adjacent the IVC most consistent with prostate cancer metastasis. 2. Distant nodal metastasis in the LEFT and RIGHT axilla. Not typical location for prostate cancer metastasis but the activity is intense and favor such. 3. New  periosteal  reaction within the RIGHT iliac bone and LEFT scapula with intense radiotracer activity consistent active skeletal metastasis. Additional active metastasis in the RIGHT mandible and proximal RIGHT femur.   2) Anterior mandibular pain  Orthopantogram done - showed IMPRESSION: Residual teeth 23-26 with decay.  No mandibular osseous lesion seen Patient has completed dental extractions and is using dentures and   PLAN: -Discussed pt labwork, 02/04/19; all values are WNL except for WBC at 13.5K, Hgb at 12.4, Lymphs Abs at 10.7K, Glucose at 100, Calcium at 8.6, Alkaline Phosphatase at 396. -Discussed 01/14/2019 PSA Free at 14.20, PSA Free Pct at 16.3, PSA serum at 86.9 -Discussed 01/30/2019 PET/CT ([id number]) which revealed "1. Dominant finding is increase in robust periosteal reaction involving the LEFT scapula and clavicle as well as the RIGHT hemipelvis. The periosteal reaction is increased by 1-2 cm. There is persistent peripheral radiotracer activity associated with the periosteal reaction consistent with progression of active prostate cancer skeletal metastasis. Additional foci of sclerosis are noted in the pelvis and spine. 2. Radiotracer activity of the retroperitoneal lymph node and axillary lymph nodes is decreased suggesting positive response within lymph nodes." -Increased PSA levels and PET/CT scan shows that pt's metastatic prostatic cancer is not currently well controlled  -Will switch from Wilson to Abiraterone + prednisone -Advised pt to continue Xtandi until new medication arrives  -Will begin 1000 mg Abiraterone + low-dose Prednisone  -Advised pt that Abiraterone can affect liver function and increase chances of leg swelling and risk of infection  -Advised pt that Abiraterone has many medication interactions that he should discuss with any physician prescribing him medications  -Will monitor liver function with labs  -Continue Xgeva every 4 weeks -Continue Lupron every 12  weeks  -Continue 50k Vitamin D twice a week -Rx 12.5 mg Fentanyl Patch, Senna  -Refill Percocet  -Will see back in 2 weeks  3) Elevated alkaline phosphatase level due to extensive bone mets.   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 Stable. -would anticipate mild anemia from ADT  5) Hematuria due to local recurrence of prostate cancer - 1 episode of hematuria - now resolved  6) Anorexia resolved.. Notes improved po intake with progressive weight gain. Patient notes he's been eating much better. . Wt Readings from Last 3 Encounters:  01/14/19 193 lb (87.5 kg)  11/19/18 204 lb 6.4 oz (92.7 kg)  09/24/18 210 lb 9.6 oz (95.5 kg)    7) Vit D deficiency with hypocalcemia -- levels are normalizing. -Being aggressively replaced since his calcium levels have dropped after Xgeva likely reflecting significant bone calcium deficits from extensive healing bone lesions. -Continue ergocalciferol 50,000U weekly  -Continue Vit D daily    8) Neoplasm related pain - much improved patient is has not needed much of his prescribed pain medications Plan  -Continue Percocet when necessary for neoplasm related bone pains, refilled today  -Senna S for bowel prophylaxis  9) Colonic thickening in transverse colon thought to be related to spasm/incomplete distension of colon in this area. -would recommend pursuing colonoscopy with PCP --pending  10) Left shoulder pain Patient has left shoulder pain s/p injury 3-4 months ago.  -XR left shoulder today- reviewed results - No fracture or dislocation is noted in the left shoulder. Sclerosis of scapula is noted consistent with metastatic disease. 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  All of the patient's questions were answered with apparent satisfaction. The patient knows to call the clinic with any problems, questions or concerns.  Sullivan Lone MD [place] AAHIVMS [facility] Hematology/Oncology Physician [facility]  (Office):       [phone] (Work cell):  [phone] (Fax):           [phone]  I, Yevette Edwards, am acting as a scribe for Dr. Sullivan Lone.   .I have reviewed the above documentation for accuracy and completeness, and I agree with the above. [name] MD

## 2019-02-06 ENCOUNTER — Other Ambulatory Visit: Payer: Self-pay | Admitting: Hematology

## 2019-02-06 ENCOUNTER — Telehealth: Payer: Self-pay | Admitting: Pharmacist

## 2019-02-06 DIAGNOSIS — C61 Malignant neoplasm of prostate: Secondary | ICD-10-CM

## 2019-02-06 MED ORDER — ABIRATERONE ACETATE 250 MG PO TABS: 1000.0000 mg | ORAL_TABLET | Freq: Every day | ORAL | 1 refills | Status: DC

## 2019-02-06 MED ORDER — PREDNISONE 5 MG PO TABS: 5.0000 mg | ORAL_TABLET | Freq: Every day | ORAL | 2 refills | Status: DC

## 2019-02-06 MED ORDER — FENTANYL 12 MCG/HR TD PT72: 1.0000 | MEDICATED_PATCH | TRANSDERMAL | 0 refills | Status: DC

## 2019-02-06 NOTE — Telephone Encounter (Signed)
Oral Oncology Pharmacist Encounter  Received new prescription for Zytiga (abiraterone) for the treatment of metastatic, castration-resistant prostate cancer in conjunction with prednisone and androgen deprivation therapy (leuprolide injections), planned duration until disease progression or unacceptable toxicity.  Labs from 01/14/19 assessed, OK for treatment initiation.  Current medication list in Epic reviewed, no DDIs with abiraterone identified.  Prescription has been e-scribed to the [facility] for benefits analysis and approval.  Oral Oncology Clinic will continue to follow for insurance authorization, copayment issues, initial counseling and start date.  Johny Drilling, PharmD, BCPS, BCOP  02/06/2019 1:05 PM Oral Oncology Clinic [phone]

## 2019-02-07 ENCOUNTER — Telehealth: Payer: Self-pay | Admitting: Pharmacist

## 2019-02-07 NOTE — Telephone Encounter (Signed)
Oral Oncology Pharmacist Encounter  Insurance authorization for abiraterone tablets submitted to [facility] part D insurance on Cover My Meds  Key: [id number] Status: pending  This encounter will continue to be updated until final determination.  Johny Drilling, PharmD, BCPS, BCOP  02/07/2019   9:20 AM Oral Oncology Clinic [phone]

## 2019-02-07 NOTE — Telephone Encounter (Signed)
Oral Chemotherapy Pharmacist Encounter   I spoke with patient for overview of: Zytiga (abiraterone) for the treatment of metastatic, castration-resistant prostate cancer in conjunction with prednisone and androgen deprivation therapy (leuprolide injections), planned duration until disease progression or unacceptable toxicity.   Counseled patient on administration, dosing, side effects, monitoring, drug-food interactions, safe handling, storage, and disposal.  Patient will take Zytiga 250mg  tablets, 4 tablets (1000mg ) by mouth once daily on an empty stomach, 1 hour before or 2 hours after a meal.  Patient states he will take his Zyitga 1st thing in the morning and will wait at least 1 hour before eating.  Patient will take prednisone 5mg  tablet, 1 tablet by mouth one daily with breakfast.  Zytiga start date: 02/12/2019  Adverse effects include but are not limited to: peripheral edema, GI upset, hypertension, hot flashes, fatigue, and arthralgias.    Reviewed with patient importance of keeping a medication schedule and plan for any missed doses.  Medication reconciliation performed and medication/allergy list updated.  Insurance authorization for Fabio Asa has been obtained. Test claim at the pharmacy revealed copayment $0 for 1st fill of abiaterone. The abiraterone and prednisone will ship from the [facility] on 02/10/2019 to deliver to patient's home on 11/17.  Patient informed the pharmacy will reach out 5-7 days prior to needing next fill of Zytiga to coordinate continued medication acquisition to prevent break in therapy.  All questions answered.  [name] voiced understanding and appreciation.   Patient knows to call the office with questions or concerns.   Johny Drilling, PharmD, BCPS, BCOP  02/07/2019 1:52 PM Oral Oncology Clinic [phone]

## 2019-02-07 NOTE — Telephone Encounter (Signed)
Oral Oncology Pharmacist Encounter  Insurance authorization for abiraterone tablets submitted to [facility] part D insurance on Cover My Meds  Key: [id number] Status: approved Effective dates: 02/07/19 - 08/06/2019  Johny Drilling, PharmD, BCPS, BCOP  02/07/2019   1:37 PM Oral Oncology Clinic [phone]

## 2019-02-11 ENCOUNTER — Inpatient Hospital Stay: Payer: Medicare Other

## 2019-02-11 ENCOUNTER — Other Ambulatory Visit: Payer: Self-pay

## 2019-02-11 ENCOUNTER — Telehealth: Payer: Self-pay | Admitting: *Deleted

## 2019-02-11 VITALS — BP 142/98 | HR 99 | Temp 98.7°F

## 2019-02-11 DIAGNOSIS — Z7189 Other specified counseling: Secondary | ICD-10-CM

## 2019-02-11 DIAGNOSIS — C61 Malignant neoplasm of prostate: Secondary | ICD-10-CM | POA: Diagnosis not present

## 2019-02-11 DIAGNOSIS — C7951 Secondary malignant neoplasm of bone: Secondary | ICD-10-CM

## 2019-02-11 LAB — CMP (CANCER CENTER ONLY)
ALT: 7 U/L (ref 0–44)
AST: 52 U/L — ABNORMAL HIGH (ref 15–41)
Albumin: 3.9 g/dL (ref 3.5–5.0)
Alkaline Phosphatase: 522 U/L — ABNORMAL HIGH (ref 38–126)
Anion gap: 10 (ref 5–15)
BUN: 11 mg/dL (ref 8–23)
CO2: 24 mmol/L (ref 22–32)
Calcium: 8.8 mg/dL — ABNORMAL LOW (ref 8.9–10.3)
Chloride: 101 mmol/L (ref 98–111)
Creatinine: 0.7 mg/dL (ref 0.61–1.24)
GFR, Est AFR Am: >60 mL/min (ref >60)
GFR, Estimated: >60 mL/min (ref >60)
Glucose, Bld: 110 mg/dL — ABNORMAL HIGH (ref 70–99)
Potassium: 4.3 mmol/L (ref 3.5–5.1)
Sodium: 135 mmol/L (ref 135–145)
Total Bilirubin: 0.3 mg/dL (ref 0.3–1.2)
Total Protein: 7.3 g/dL (ref 6.5–8.1)

## 2019-02-11 LAB — CBC WITH DIFFERENTIAL/PLATELET
Abs Immature Granulocytes: 0.03 10*3/uL (ref 0.00–0.07)
Basophils Absolute: 0 10*3/uL (ref 0.0–0.1)
Basophils Relative: 0 %
Eosinophils Absolute: 0.2 10*3/uL (ref 0.0–0.5)
Eosinophils Relative: 2 %
HCT: 42.2 % (ref 39.0–52.0)
Hemoglobin: 13.2 g/dL (ref 13.0–17.0)
Immature Granulocytes: 0 %
Lymphocytes Relative: 59 %
Lymphs Abs: 5.9 10*3/uL — ABNORMAL HIGH (ref 0.7–4.0)
MCH: 28.6 pg (ref 26.0–34.0)
MCHC: 31.3 g/dL (ref 30.0–36.0)
MCV: 91.3 fL (ref 80.0–100.0)
Monocytes Absolute: 0.4 10*3/uL (ref 0.1–1.0)
Monocytes Relative: 4 %
Neutro Abs: 3.6 10*3/uL (ref 1.7–7.7)
Neutrophils Relative %: 35 %
Platelets: 328 10*3/uL (ref 150–400)
RBC: 4.62 MIL/uL (ref 4.22–5.81)
RDW: 15 % (ref 11.5–15.5)
WBC: 10.1 10*3/uL (ref 4.0–10.5)
nRBC: 0 % (ref 0.0–0.2)

## 2019-02-11 MED ORDER — DENOSUMAB 120 MG/1.7ML ~~LOC~~ SOLN
SUBCUTANEOUS | Status: AC
  Filled 2019-02-11: qty 1.7

## 2019-02-11 MED ORDER — DENOSUMAB 120 MG/1.7ML ~~LOC~~ SOLN
120.0000 mg | Freq: Once | SUBCUTANEOUS | Status: AC
  Administered 2019-02-11: 12:00:00 120 mg via SUBCUTANEOUS

## 2019-02-11 NOTE — Telephone Encounter (Signed)
Patient here for Prolia today. Informed nurse that the pain patch and pills are not working. Called patient - he states intense Pain in left shoulder and down through hip and leg, stating he has not slept in 2 nights.

## 2019-02-11 NOTE — Patient Instructions (Signed)
Denosumab injection What is this medicine? DENOSUMAB (den oh sue mab) slows bone breakdown. Prolia is used to treat osteoporosis in women after menopause and in men, and in people who are taking corticosteroids for 6 months or more. Xgeva is used to treat a high calcium level due to cancer and to prevent bone fractures and other bone problems caused by multiple myeloma or cancer bone metastases. Xgeva is also used to treat giant cell tumor of the bone. This medicine may be used for other purposes; ask your health care provider or pharmacist if you have questions. COMMON BRAND NAME(S): Prolia, XGEVA What should I tell my health care provider before I take this medicine? They need to know if you have any of these conditions:  dental disease  having surgery or tooth extraction  infection  kidney disease  low levels of calcium or Vitamin D in the blood  malnutrition  on hemodialysis  skin conditions or sensitivity  thyroid or parathyroid disease  an unusual reaction to denosumab, other medicines, foods, dyes, or preservatives  pregnant or trying to get pregnant  breast-feeding How should I use this medicine? This medicine is for injection under the skin. It is given by a health care professional in a hospital or clinic setting. A special MedGuide will be given to you before each treatment. Be sure to read this information carefully each time. For Prolia, talk to your pediatrician regarding the use of this medicine in children. Special care may be needed. For Xgeva, talk to your pediatrician regarding the use of this medicine in children. While this drug may be prescribed for children as young as 13 years for selected conditions, precautions do apply. Overdosage: If you think you have taken too much of this medicine contact a poison control center or emergency room at once. NOTE: This medicine is only for you. Do not share this medicine with others. What if I miss a dose? It is  important not to miss your dose. Call your doctor or health care professional if you are unable to keep an appointment. What may interact with this medicine? Do not take this medicine with any of the following medications:  other medicines containing denosumab This medicine may also interact with the following medications:  medicines that lower your chance of fighting infection  steroid medicines like prednisone or cortisone This list may not describe all possible interactions. Give your health care provider a list of all the medicines, herbs, non-prescription drugs, or dietary supplements you use. Also tell them if you smoke, drink alcohol, or use illegal drugs. Some items may interact with your medicine. What should I watch for while using this medicine? Visit your doctor or health care professional for regular checks on your progress. Your doctor or health care professional may order blood tests and other tests to see how you are doing. Call your doctor or health care professional for advice if you get a fever, chills or sore throat, or other symptoms of a cold or flu. Do not treat yourself. This drug may decrease your body's ability to fight infection. Try to avoid being around people who are sick. You should make sure you get enough calcium and vitamin D while you are taking this medicine, unless your doctor tells you not to. Discuss the foods you eat and the vitamins you take with your health care professional. See your dentist regularly. Brush and floss your teeth as directed. Before you have any dental work done, tell your dentist you are   receiving this medicine. Do not become pregnant while taking this medicine or for 5 months after stopping it. Talk with your doctor or health care professional about your birth control options while taking this medicine. Women should inform their doctor if they wish to become pregnant or think they might be pregnant. There is a potential for serious side  effects to an unborn child. Talk to your health care professional or pharmacist for more information. What side effects may I notice from receiving this medicine? Side effects that you should report to your doctor or health care professional as soon as possible:  allergic reactions like skin rash, itching or hives, swelling of the face, lips, or tongue  bone pain  breathing problems  dizziness  jaw pain, especially after dental work  redness, blistering, peeling of the skin  signs and symptoms of infection like fever or chills; cough; sore throat; pain or trouble passing urine  signs of low calcium like fast heartbeat, muscle cramps or muscle pain; pain, tingling, numbness in the hands or feet; seizures  unusual bleeding or bruising  unusually weak or tired Side effects that usually do not require medical attention (report to your doctor or health care professional if they continue or are bothersome):  constipation  diarrhea  headache  joint pain  loss of appetite  muscle pain  runny nose  tiredness  upset stomach This list may not describe all possible side effects. Call your doctor for medical advice about side effects. You may report side effects to FDA at 1-800-FDA-1088. Where should I keep my medicine? This medicine is only given in a clinic, doctor's office, or other health care setting and will not be stored at home. NOTE: This sheet is a summary. It may not cover all possible information. If you have questions about this medicine, talk to your doctor, pharmacist, or health care provider.  2020 [employer]vier/Gold Standard (2017-07-20 16:10:44)

## 2019-02-12 ENCOUNTER — Other Ambulatory Visit: Payer: Self-pay | Admitting: Hematology

## 2019-02-12 ENCOUNTER — Telehealth: Payer: Self-pay | Admitting: *Deleted

## 2019-02-12 MED ORDER — DEXAMETHASONE 4 MG PO TABS: 4.0000 mg | ORAL_TABLET | Freq: Two times a day (BID) | ORAL | 0 refills | Status: AC

## 2019-02-12 MED ORDER — DEXAMETHASONE 4 MG PO TABS: 4.0000 mg | ORAL_TABLET | Freq: Two times a day (BID) | ORAL | 0 refills | Status: DC

## 2019-02-12 MED ORDER — FENTANYL 12 MCG/HR TD PT72: 2.0000 | MEDICATED_PATCH | TRANSDERMAL | 0 refills | Status: DC

## 2019-02-12 NOTE — Telephone Encounter (Signed)
02/12/2019-Dr. Irene Limbo ordered Dexamethasone 4 mg BID for 7 days. Patient is to hold prednisone 5mg  daily for 7 days and take dexamethasone instead. Patient is to increase Fentanyl and use 2 - 12 mg Fentanyl patches every 3 days instead of 1. All information given to patient. Patient verbalized understanding of directions. Patient requests Dexamethasone RX be sent to [facility] in [place], [place]. Prescription sent there.

## 2019-02-12 NOTE — Telephone Encounter (Signed)
Patient requested dexamethasone Rx be sent to [facility] in  [place] instead of [place].

## 2019-02-14 NOTE — Telephone Encounter (Signed)
Oral Oncology Pharmacist Encounter  Confirmed with the [facility] outpatient pharmacy that [facility] and prednisone were delivered on 02/11/2019 for copayment $0.  Johny Drilling, PharmD, BCPS, BCOP  02/14/2019   11:37 AM Oral Oncology Clinic [phone]

## 2019-02-18 ENCOUNTER — Other Ambulatory Visit: Payer: Self-pay | Admitting: Oncology

## 2019-02-18 ENCOUNTER — Telehealth: Payer: Self-pay | Admitting: *Deleted

## 2019-02-18 MED ORDER — FENTANYL 12 MCG/HR TD PT72: 2.0000 | MEDICATED_PATCH | TRANSDERMAL | 0 refills | Status: DC

## 2019-02-18 NOTE — Telephone Encounter (Signed)
Requested refill of Fentanyl 28mcg patch sent to [facility] in [place]. Original Rx was on 11/2, but he was directed on 11/18 by Dr. Irene Limbo to apply 2 patches every 3 days, so he ran out sooner. Refill request sent to Dr. Alen Blew as Dr.Kale out of office

## 2019-02-21 ENCOUNTER — Telehealth: Payer: Self-pay | Admitting: *Deleted

## 2019-02-21 ENCOUNTER — Other Ambulatory Visit: Payer: Self-pay | Admitting: Oncology

## 2019-02-21 MED ORDER — FENTANYL 12 MCG/HR TD PT72: 2.0000 | MEDICATED_PATCH | TRANSDERMAL | 0 refills | Status: DC

## 2019-02-21 NOTE — Telephone Encounter (Signed)
Contacted by patient friend - patient still experiencing severe pain in left shoulder blade area and down through hip and leg on left. Sleeping very poorly, even after increasing fentanyl to 2-49mcg patches every 3 days and taking oxycodone - acetaminophen 10-325 every 4 hours for breakthrough pain. She stated he is getting worn down. Advised her that Dr. Irene Limbo will be informed and she verbalized understanding.

## 2019-02-25 ENCOUNTER — Other Ambulatory Visit: Payer: Self-pay | Admitting: *Deleted

## 2019-02-25 DIAGNOSIS — C7951 Secondary malignant neoplasm of bone: Secondary | ICD-10-CM

## 2019-02-25 MED ORDER — OXYCODONE-ACETAMINOPHEN 10-325 MG PO TABS: 1.0000 | ORAL_TABLET | ORAL | 0 refills | Status: DC | PRN

## 2019-02-25 NOTE — Telephone Encounter (Signed)
Requested refill of pain medicine/pills.

## 2019-02-28 ENCOUNTER — Telehealth: Payer: Self-pay | Admitting: *Deleted

## 2019-02-28 NOTE — Telephone Encounter (Signed)
[facility] [address], [place] called to say they are out of percocet.   We need to send it to [facility]  @ [address], [place]

## 2019-03-06 ENCOUNTER — Other Ambulatory Visit: Payer: Self-pay | Admitting: Hematology

## 2019-03-06 DIAGNOSIS — C7951 Secondary malignant neoplasm of bone: Secondary | ICD-10-CM

## 2019-03-06 MED ORDER — FENTANYL 25 MCG/HR TD PT72: 1.0000 | MEDICATED_PATCH | TRANSDERMAL | 0 refills | Status: DC

## 2019-03-06 MED ORDER — OXYCODONE-ACETAMINOPHEN 10-325 MG PO TABS: 1.0000 | ORAL_TABLET | ORAL | 0 refills | Status: DC | PRN

## 2019-03-11 ENCOUNTER — Telehealth: Payer: Self-pay | Admitting: *Deleted

## 2019-03-11 ENCOUNTER — Ambulatory Visit: Payer: Medicare Other

## 2019-03-11 ENCOUNTER — Inpatient Hospital Stay: Payer: Medicare Other

## 2019-03-11 ENCOUNTER — Other Ambulatory Visit: Payer: Medicare Other

## 2019-03-11 ENCOUNTER — Inpatient Hospital Stay: Payer: Medicare Other | Admitting: Hematology

## 2019-03-11 ENCOUNTER — Telehealth: Payer: Self-pay | Admitting: Hematology

## 2019-03-11 NOTE — Telephone Encounter (Signed)
Patient missed appt this morning for lab/Dr.Kale/Inj. Contacted patient - no answer - contacted Danley Danker (friend). Ms. Ilda Foil states she did not know patient had appt heard patient on phone stating he forgot today's appts. Advised Ms. Ilda Foil that scheduler will be contacting patient/her to reschedule missed appts. Dr. Irene Limbo informed.  Dr. Irene Limbo asked for patient to be rescheduled on Friday  - schedule message sent with request to contact patient's friend as he often does not answer phone.

## 2019-03-11 NOTE — Telephone Encounter (Signed)
Called patient friend regarding 12/18

## 2019-03-14 ENCOUNTER — Inpatient Hospital Stay (HOSPITAL_BASED_OUTPATIENT_CLINIC_OR_DEPARTMENT_OTHER): Payer: Medicare Other | Admitting: Hematology

## 2019-03-14 ENCOUNTER — Other Ambulatory Visit: Payer: Self-pay

## 2019-03-14 ENCOUNTER — Inpatient Hospital Stay: Payer: Medicare Other

## 2019-03-14 ENCOUNTER — Inpatient Hospital Stay: Payer: Medicare Other | Attending: Hematology

## 2019-03-14 VITALS — BP 135/88 | HR 86 | Temp 98.2°F | Resp 18 | Ht 73.0 in | Wt 185.6 lb

## 2019-03-14 DIAGNOSIS — C61 Malignant neoplasm of prostate: Secondary | ICD-10-CM

## 2019-03-14 DIAGNOSIS — C7951 Secondary malignant neoplasm of bone: Secondary | ICD-10-CM

## 2019-03-14 DIAGNOSIS — Z7189 Other specified counseling: Secondary | ICD-10-CM

## 2019-03-14 DIAGNOSIS — G893 Neoplasm related pain (acute) (chronic): Secondary | ICD-10-CM

## 2019-03-14 LAB — CBC WITH DIFFERENTIAL/PLATELET
Abs Immature Granulocytes: 0.02 10*3/uL (ref 0.00–0.07)
Basophils Absolute: 0 10*3/uL (ref 0.0–0.1)
Basophils Relative: 0 %
Eosinophils Absolute: 0.2 10*3/uL (ref 0.0–0.5)
Eosinophils Relative: 2 %
HCT: 41 % (ref 39.0–52.0)
Hemoglobin: 12.7 g/dL — ABNORMAL LOW (ref 13.0–17.0)
Immature Granulocytes: 0 %
Lymphocytes Relative: 64 %
Lymphs Abs: 5.9 10*3/uL — ABNORMAL HIGH (ref 0.7–4.0)
MCH: 28.7 pg (ref 26.0–34.0)
MCHC: 31 g/dL (ref 30.0–36.0)
MCV: 92.8 fL (ref 80.0–100.0)
Monocytes Absolute: 0.4 10*3/uL (ref 0.1–1.0)
Monocytes Relative: 4 %
Neutro Abs: 2.8 10*3/uL (ref 1.7–7.7)
Neutrophils Relative %: 30 %
Platelets: 295 10*3/uL (ref 150–400)
RBC: 4.42 MIL/uL (ref 4.22–5.81)
RDW: 15.4 % (ref 11.5–15.5)
WBC: 9.4 10*3/uL (ref 4.0–10.5)
nRBC: 0 % (ref 0.0–0.2)

## 2019-03-14 LAB — CMP (CANCER CENTER ONLY)
ALT: 6 U/L (ref 0–44)
AST: 38 U/L (ref 15–41)
Albumin: 3.8 g/dL (ref 3.5–5.0)
Alkaline Phosphatase: 1060 U/L — ABNORMAL HIGH (ref 38–126)
Anion gap: 9 (ref 5–15)
BUN: 9 mg/dL (ref 8–23)
CO2: 26 mmol/L (ref 22–32)
Calcium: 8.4 mg/dL — ABNORMAL LOW (ref 8.9–10.3)
Chloride: 105 mmol/L (ref 98–111)
Creatinine: 0.62 mg/dL (ref 0.61–1.24)
GFR, Est AFR Am: >60 mL/min (ref >60)
GFR, Estimated: >60 mL/min (ref >60)
Glucose, Bld: 98 mg/dL (ref 70–99)
Potassium: 3.7 mmol/L (ref 3.5–5.1)
Sodium: 140 mmol/L (ref 135–145)
Total Bilirubin: 0.5 mg/dL (ref 0.3–1.2)
Total Protein: 7.1 g/dL (ref 6.5–8.1)

## 2019-03-14 MED ORDER — DENOSUMAB 120 MG/1.7ML ~~LOC~~ SOLN
120.0000 mg | Freq: Once | SUBCUTANEOUS | Status: AC
  Administered 2019-03-14: 120 mg via SUBCUTANEOUS

## 2019-03-14 MED ORDER — DENOSUMAB 120 MG/1.7ML ~~LOC~~ SOLN
SUBCUTANEOUS | Status: AC
  Filled 2019-03-14: qty 1.7

## 2019-03-14 NOTE — Progress Notes (Signed)
Confirmed with MD patient is okay to proceed with Xgeva injection today with calcium of 8.4.   Leron Croak, PharmD, BCPS PGY2 Hematology/Oncology Pharmacy Resident 03/14/2019 12:10 PM

## 2019-03-14 NOTE — Progress Notes (Signed)
HEMATOLOGY/ONCOLOGY CLINIC NOTE  Date of Service: 03/14/19   Patient Care Team: [name], NP as PCP - General (Internal Medicine)  CHIEF COMPLAINTS/PURPOSE OF CONSULTATION:   F/u for continue mx of metastatic prostate cancer  HISTORY OF PRESENTING ILLNESS:  Eric Issac. is a wonderful 73 y.o. male who has been referred to [place] from the [facility] ED by [name] PA-C for evaluation and management of metastatic malignancy concerning for metastatic prostate cancer.  Patient has had a h/o locally advanced prostate cancer diagnosed in 2014 and had a radical prostatectomy and pelvic LN dissection by Dr [name] in 10/2012. Pathology showed pT3b pN0 disease (with positive margins, extraprostatic extension, seminal vesicle involvement and angiolymphatic invasion). Bone scan was negative for metastatic disease. Patient report no post-op RT. He notes that he received lupron shots as per his urologist for 1 year post-operatively. He was being monitored by Dr [name] and last had clinical evaluation and PSA about 34months ago - PSA level not available in our system. He notes that he was lost to f/u since then.  Patient presented to the ED with worsening lower and middle back pain for 2 weeks. Also notes about 15-20lbs weight loss and anorexia over the last 6-8weeks. He also notes hematuria. No fevers/chills. Patient had a CT abd/pelvis in the ED which showed Innumerable sclerotic lesions in all imaged bones consistent metastatic disease. The prostate gland has an irregular appearance and the patient may have prostate carcinoma. A few enlarged retroperitoneal lymph nodes are identified worrisome for additional foci of metastatic disease.  Patient's pain was somewhat controlled with prn percocet in ED and he was discharged home with f/u with [place]. Patient notes that the patient wakes him up in the night.  No urinary retention. No loss of bowel or bladder control. No new  neurological symptoms in his extremities.   INTERVAL HISTORY   Eric Osborn. is here to follow up of his metastatic prostatic cancer and next Lupron shot. The patient's last visit with [place] was on 02/04/2019. The pt reports that he is doing well overall.  The pt reports He ordinally had pain in the left scapula that has improved with the increase dosage of fentanyl  He is taking zytiga and prednisone everyday.  He has been losing weight and has a lack of appetite.  He drinks one Boost every morning along with a banana.   Lab results today (03/14/19) of CBC w/diff and CMP is as follows: all values are WNL except for Hemoglobin at 12.7, Lymphs Abs at 5.9, Calcium at 8.4, Alkaline Phosphatase at 1,060,  PENDING PSA.  On review of systems, pt reports small bump on his back, no new concerns and denies mouth sores, abdominal pain, trouble passing urine, testicular pain and any other symptoms.  MEDICAL HISTORY:   Past Medical History:  Diagnosis Date  . Arthritis    left knee, left shoulder  . Erectile dysfunction 06/25/2012  . Foley catheter in place   . Goiter 06/25/2012  . H/O hiatal hernia   . Hematuria    occasional  . Hemorrhoid   . History of bladder infections   . Joint pain     SURGICAL HISTORY: Past Surgical History:  Procedure Laterality Date  . INGUINAL HERNIA REPAIR  8 yrs ago  . LYMPHADENECTOMY Bilateral 11/20/2012   Procedure: WITH BILATERAL PELVIC LYMPH NODE DISSECTION ;  Surgeon: [name], MD;  Location: [facility];  Service: Urology;  Laterality: Bilateral;  . ROBOT ASSISTED LAPAROSCOPIC RADICAL PROSTATECTOMY N/A 11/20/2012   Procedure: ROBOTIC ASSISTED LAPAROSCOPIC RADICAL PROSTATECTOMY;  Surgeon: [name], MD;  Location: [facility];  Service: Urology;  Laterality: N/A;    SOCIAL HISTORY: Social History   Socioeconomic History  . Marital status: Divorced    Spouse name: Not on file  . Number of children: Not on file  . Years of education: 86  . Highest  education level: Not on file  Occupational History  . Occupation: Retired    Comment: textile  Tobacco Use  . Smoking status: Former Smoker    Packs/day: 0.25    Years: 25.00    Pack years: 6.25    Types: Cigarettes  . Smokeless tobacco: Former Systems developer  . Tobacco comment: quit date 2016  Substance and Sexual Activity  . Alcohol use: Not Currently    Comment: occasional beer   . Drug use: Yes    Types: Marijuana    Comment: 3 x a month uses marijuana  . Sexual activity: Yes  Other Topics Concern  . Not on file  Social History Narrative   Regular exercise-no   Caffeine Use-yes   Social Determinants of Health   Financial Resource Strain:   . Difficulty of Paying Living Expenses: Not on file  Food Insecurity:   . Worried About Charity fundraiser in the Last Year: Not on file  . Ran Out of Food in the Last Year: Not on file  Transportation Needs:   . Lack of Transportation (Medical): Not on file  . Lack of Transportation (Non-Medical): Not on file  Physical Activity:   . Days of Exercise per Week: Not on file  . Minutes of Exercise per Session: Not on file  Stress:   . Feeling of Stress : Not on file  Social Connections:   . Frequency of Communication with Friends and Family: Not on file  . Frequency of Social Gatherings with Friends and Family: Not on file  . Attends Religious Services: Not on file  . Active Member of Clubs or Organizations: Not on file  . Attends Archivist Meetings: Not on file  . Marital Status: Not on file  Intimate Partner Violence:   . Fear of Current or Ex-Partner: Not on file  . Emotionally Abused: Not on file  . Physically Abused: Not on file  . Sexually Abused: Not on file    FAMILY HISTORY: Family History  Problem Relation Age of Onset  . Heart disease Sister   . Stroke Sister   . Colon cancer Paternal Uncle   . Diabetes Neg Hx     ALLERGIES:  is allergic to heparin and poison ivy extract [poison ivy  extract].  MEDICATIONS:  Current Outpatient Medications  Medication Sig Dispense Refill  . abiraterone acetate (ZYTIGA) 250 MG tablet Take 4 tablets (1,000 mg total) by mouth daily. Take on an empty stomach 1 hour before or 2 hours after a meal 120 tablet 1  . denosumab (XGEVA) 120 MG/1.7ML SOLN injection Inject 120 mg into the skin once.    . fentaNYL (DURAGESIC) 25 MCG/HR Place 1 patch onto the skin every 3 (three) days. 10 patch 0  . leuprolide (LUPRON) 22.5 MG injection Inject 22.5 mg into the muscle every 3 (three) months.    . mupirocin ointment (BACTROBAN) 2 % Apply 1 application topically 4 (four) times daily. 30 g 0  . oxyCODONE-acetaminophen (PERCOCET) 10-325 MG tablet Take 1 tablet by mouth every 4 (  four) hours as needed. for pain 90 tablet 0  . predniSONE (DELTASONE) 5 MG tablet Take 1 tablet (5 mg total) by mouth daily with breakfast. 30 tablet 2  . senna-docusate (SENNA S) 8.6-50 MG tablet Take 2 tablets by mouth at bedtime. 60 tablet 1  . Vitamin D, Ergocalciferol, (DRISDOL) 1.25 MG (50000 UT) CAPS capsule Take 1 capsule (50,000 Units total) by mouth 2 (two) times a week. 24 capsule 6   No current facility-administered medications for this visit.    REVIEW OF SYSTEMS:   A 10+ POINT REVIEW OF SYSTEMS WAS OBTAINED including neurology, dermatology, psychiatry, cardiac, respiratory, lymph, extremities, GI, GU, Musculoskeletal, constitutional, breasts, reproductive, HEENT.  All pertinent positives are noted in the HPI.  All others are negative.    PHYSICAL EXAMINATION: ECOG FS:1 - Symptomatic but completely ambulatory  Vitals:   03/14/19 1045  BP: 135/88  Pulse: 86  Resp: 18  Temp: 98.2 F (36.8 C)  SpO2: 100%   Wt Readings from Last 3 Encounters:  03/14/19 185 lb 9.6 oz (84.2 kg)  01/14/19 193 lb (87.5 kg)  11/19/18 204 lb 6.4 oz (92.7 kg)   Body mass index is 24.49 kg/m.    GENERAL:alert, in no acute distress and comfortable SKIN: no acute rashes, no significant  lesions EYES: conjunctiva are pink and non-injected, sclera anicteric OROPHARYNX: MMM, no exudates, no oropharyngeal erythema or ulceration NECK: supple, no JVD LYMPH:  no palpable lymphadenopathy in the cervical, axillary or inguinal regions LUNGS: clear to auscultation b/l with normal respiratory effort HEART: regular rate & rhythm ABDOMEN:  normoactive bowel sounds , non tender, not distended. Extremity: no pedal edema PSYCH: alert & oriented x 3 with fluent speech NEURO: no focal motor/sensory deficits BACK: L 2-3 lumbar pain and pain/swelling in left scapula.   LABORATORY DATA:  I have reviewed the data as listed  . CBC Latest Ref Rng & Units 03/14/2019 02/11/2019 01/14/2019  WBC 4.0 - 10.5 K/uL 9.4 10.1 13.5(H)  Hemoglobin 13.0 - 17.0 g/dL 12.7(L) 13.2 12.4(L)  Hematocrit 39.0 - 52.0 % 41.0 42.2 39.3  Platelets 150 - 400 K/uL 295 328 309    CMP Latest Ref Rng & Units 03/14/2019 02/11/2019 01/14/2019  Glucose 70 - 99 mg/dL 98 110(H) 100(H)  BUN 8 - 23 mg/dL 9 11 9   Creatinine 0.61 - 1.24 mg/dL 0.62 0.70 0.65  Sodium 135 - 145 mmol/L 140 135 139  Potassium 3.5 - 5.1 mmol/L 3.7 4.3 4.3  Chloride 98 - 111 mmol/L 105 101 104  CO2 22 - 32 mmol/L 26 24 25   Calcium 8.9 - 10.3 mg/dL 8.4(L) 8.8(L) 8.6(L)  Total Protein 6.5 - 8.1 g/dL 7.1 7.3 7.1  Total Bilirubin 0.3 - 1.2 mg/dL 0.5 0.3 0.3  Alkaline Phos 38 - 126 U/L 1,060(H) 522(H) 396(H)  AST 15 - 41 U/L 38 52(H) 37  ALT 0 - 44 U/L 6 7 7    .     08/14/2017 Peripheral Blood Flow Cytometry           RADIOGRAPHIC STUDIES: I have personally reviewed the radiological images as listed and agreed with the findings in the report. No results found.  ASSESSMENT & PLAN:   73 y.o. AAM with previous h/o of locally advanced pT3b,pN0 prostate cancer now with concern for newly noted metastatic prostate cancer.  1) Metastatic Prostate Cancer  PSA level 550 pre-treatment and have improved significantly and were down to  0.6. Now gradually rising again and if upt o  49.7  without any other associated new symptoms. Patient had been noted to have extensive osseous metastatic disease throughout the spine and bilaterally in the calvarium as well. Also noted to have local recurrence in the prostatic bed, retroperitoneal Lnadenoapathy and some mediastinal LNadenoapathy.  ECOG PS 1 Previously locally advanced pT3b,pN0 diagnosed in 10/2012 s/p radical prostatectomy and ADT x 1 yr. Testosterone level 7 -- is dropping appropriately with treatment. S/p 6 cycles of taxotere.  12/25/17 PET/CT revealed Solitary retroperitoneal lymph node with radiotracer activity adjacent the IVC most consistent with prostate cancer metastasis. 2. Distant nodal metastasis in the LEFT and RIGHT axilla. Not typical location for prostate cancer metastasis but the activity is intense and favor such. 3. New periosteal reaction within the RIGHT iliac bone and LEFT scapula with intense radiotracer activity consistent active skeletal metastasis. Additional active metastasis in the RIGHT mandible and proximal RIGHT femur.   2) Anterior mandibular pain  Orthopantogram done - showed IMPRESSION: Residual teeth 23-26 with decay.  No mandibular osseous lesion seen Patient has completed dental extractions and is using dentures and  3) Elevated alkaline phosphatase level due to extensive bone mets.   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 Stable. -would anticipate mild anemia from ADT  5) Hematuria due to local recurrence of prostate cancer - 1 episode of hematuria - now resolved  6) Anorexia resolved.. Notes improved po intake with progressive weight gain. Patient notes he's been eating much better. . Wt Readings from Last 3 Encounters:  03/14/19 185 lb 9.6 oz (84.2 kg)  01/14/19 193 lb (87.5 kg)  11/19/18 204 lb 6.4 oz (92.7 kg)    7) Vit D deficiency with hypocalcemia -- levels are normalizing. -Being aggressively replaced  since his calcium levels have dropped after Xgeva likely reflecting significant bone calcium deficits from extensive healing bone lesions. -Continue ergocalciferol 50,000U weekly  -Continue Vit D daily    8) Neoplasm related pain - much improved patient is has not needed much of his prescribed pain medications Plan  -Continue Percocet when necessary for neoplasm related bone pains, refilled today  -Senna S for bowel prophylaxis  9) Colonic thickening in transverse colon thought to be related to spasm/incomplete distension of colon in this area. -would recommend pursuing colonoscopy with PCP --pending  10) Left shoulder pain Patient has left shoulder pain s/p injury 3-4 months ago.  -XR left shoulder today- reviewed results - No fracture or dislocation is noted in the left shoulder. Sclerosis of scapula is noted consistent with metastatic disease. 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 -Discussed 03/14/19 Hemoglobin at 12.7 -Discussed 03/14/19 Alkaline Phosphatase at 1060 -Discussed 03/14/19 WBC at 9.4 -Discussed 03/14/19 Platelets at 295 -Discussed that he can eat between meals in order to increase his weight.   -Advised that he will need to continue the Lupron injection every three months.  -Discussed switching from Xgeva to [place] . Zometa will be administered through IV. patient confirmed that this would be okay. -Advised  Monitor the carbuncles on his back. It does not look infected at this time.  -will consider referral to radiation oncology if patient has any uncontrolled pain on f/u in 4 weeks to consider palliative RT  FOLLOW UP: -Continue Lupron q12 weeks please schedule next 4 doses -  Xgeva today. Switching to Zometa q4weeks  from next visit in 4 weeks -labs and MD in 4 weeks with next injections.  The total time spent in the appt was 25 minutes and more than 50% was on counseling and direct patient cares.  All of the patient's questions were answered with apparent satisfaction. The patient knows to call the clinic with any problems, questions or concerns.   Sullivan Lone MD MS AAHIVMS [facility] Hematology/Oncology Physician [facility]  (Office):       [phone] (Work cell):  [phone] (Fax):           [phone]  I, Scot Dock, am acting as a scribe for Dr. Sullivan Lone.   .I have reviewed the above documentation for accuracy and completeness, and I agree with the above. [name] MD

## 2019-03-15 LAB — PSA, TOTAL AND FREE
PSA, Free Pct: 12.9 %
PSA, Free: 13.8 ng/mL
Prostate Specific Ag, Serum: 107 ng/mL — ABNORMAL HIGH (ref 0.0–4.0)

## 2019-03-17 ENCOUNTER — Telehealth: Payer: Self-pay | Admitting: Hematology

## 2019-03-17 ENCOUNTER — Telehealth: Payer: Self-pay | Admitting: *Deleted

## 2019-03-17 NOTE — Telephone Encounter (Signed)
Scheduled appt per 12/18 los.  Spoke with pt and he is aware of the appt date and time,

## 2019-03-17 NOTE — Telephone Encounter (Signed)
Was informed by Ebony Hail, scheduler that pt will need refill of Fentanyl patch.  Pt asked for med refill when scheduler contacted pt for next appt.  Pt saw Dr. Irene Limbo on Fri  03/14/19.

## 2019-03-31 ENCOUNTER — Other Ambulatory Visit: Payer: Self-pay | Admitting: *Deleted

## 2019-03-31 DIAGNOSIS — C7951 Secondary malignant neoplasm of bone: Secondary | ICD-10-CM

## 2019-03-31 MED ORDER — OXYCODONE-ACETAMINOPHEN 10-325 MG PO TABS: 1.0000 | ORAL_TABLET | ORAL | 0 refills | Status: DC | PRN

## 2019-03-31 MED ORDER — FENTANYL 25 MCG/HR TD PT72: 1.0000 | MEDICATED_PATCH | TRANSDERMAL | 0 refills | Status: DC

## 2019-03-31 NOTE — Telephone Encounter (Signed)
Requested refill of Fentanyl patch 

## 2019-04-01 ENCOUNTER — Other Ambulatory Visit: Payer: Self-pay | Admitting: Hematology

## 2019-04-01 DIAGNOSIS — C61 Malignant neoplasm of prostate: Secondary | ICD-10-CM

## 2019-04-07 ENCOUNTER — Telehealth: Payer: Self-pay | Admitting: *Deleted

## 2019-04-07 NOTE — Progress Notes (Signed)
HEMATOLOGY/ONCOLOGY CLINIC NOTE  Date of Service: 04/08/19   Patient Care Team: [name], NP as PCP - General (Internal Medicine)  CHIEF COMPLAINTS/PURPOSE OF CONSULTATION:   F/u for continue mx of metastatic prostate cancer  HISTORY OF PRESENTING ILLNESS:  Eric Costabile. is a wonderful 74 y.o. male who has been referred to [place] from the [facility] long hospital ED by [name] PA-C for evaluation and management of metastatic malignancy concerning for metastatic prostate cancer.  Patient has had a h/o locally advanced prostate cancer diagnosed in 2014 and had a radical prostatectomy and pelvic LN dissection by Dr [name] in 10/2012. Pathology showed pT3b pN0 disease (with positive margins, extraprostatic extension, seminal vesicle involvement and angiolymphatic invasion). Bone scan was negative for metastatic disease. Patient report no post-op RT. He notes that he received lupron shots as per his urologist for 1 year post-operatively. He was being monitored by Dr [name] and last had clinical evaluation and PSA about 11month ago - PSA level not available in our system. He notes that he was lost to f/u since then.  Patient presented to the ED with worsening lower and middle back pain for 2 weeks. Also notes about 15-20lbs weight loss and anorexia over the last 6-8weeks. He also notes hematuria. No fevers/chills. Patient had a CT abd/pelvis in the ED which showed Innumerable sclerotic lesions in all imaged bones consistent metastatic disease. The prostate gland has an irregular appearance and the patient may have prostate carcinoma. A few enlarged retroperitoneal lymph nodes are identified worrisome for additional foci of metastatic disease.  Patient's pain was somewhat controlled with prn percocet in ED and he was discharged home with f/u with [place] Patient notes that the patient wakes him up in the night.  No urinary retention. No loss of bowel or bladder control. No new  neurological symptoms in his extremities.   INTERVAL HISTORY   WShigeru Osborn is here to follow up of his metastatic prostatic cancer and next Lupron shot. The patient's last visit with [place] on 03/14/2019. The pt reports that he is doing well overall.  The pt reports that he has started to hurt again. The pain starts at the top of his buttocks and continues down to his knees. He is having difficulty sitting down to use the restroom and walk. It is a sharp pain that is accompanied by a tingling sensation. His upper left shoulder is still hurting but it is more of a dull ache. Pt denies lifting anything heavy or moving in any way that would cause injury. He denies any issues with the Lupron injections and continues to use the Fentanyl Patch and Percocet as prescribed. He has been taking a single 10 mg Percocet every 3 hours but this is not controlling his pain. He does not have a back brace at home. His appetite has been lower and he has not been eating well at home.   Lab results today (04/08/19) of CBC w/diff and CMP is as follows: all values are WNL except for Hgb at 12.9, Lymphs Abs at 5.8K, Glucose at 117, Calcium at 8.6, Alkaline Phosphatase at  1102.  On review of systems, pt reports left shoulder ache, buttocks/upper leg pain, low appetite and denies abdominal pain, leg swelling, urinary/ bowel incontinence and any other symptoms.   MEDICAL HISTORY:   Past Medical History:  Diagnosis Date  . Arthritis    left knee, left shoulder  . Erectile dysfunction 06/25/2012  .  Foley catheter in place   . Goiter 06/25/2012  . H/O hiatal hernia   . Hematuria    occasional  . Hemorrhoid   . History of bladder infections   . Joint pain     SURGICAL HISTORY: Past Surgical History:  Procedure Laterality Date  . INGUINAL HERNIA REPAIR  8 yrs ago  . LYMPHADENECTOMY Bilateral 11/20/2012   Procedure: WITH BILATERAL PELVIC LYMPH NODE DISSECTION ;  Surgeon: [name], MD;  Location: [facility];   Service: Urology;  Laterality: Bilateral;  . ROBOT ASSISTED LAPAROSCOPIC RADICAL PROSTATECTOMY N/A 11/20/2012   Procedure: ROBOTIC ASSISTED LAPAROSCOPIC RADICAL PROSTATECTOMY;  Surgeon: [name], MD;  Location: [facility];  Service: Urology;  Laterality: N/A;    SOCIAL HISTORY: Social History   Socioeconomic History  . Marital status: Divorced    Spouse name: Not on file  . Number of children: Not on file  . Years of education: 9  . Highest education level: Not on file  Occupational History  . Occupation: Retired    Comment: textile  Tobacco Use  . Smoking status: Former Smoker    Packs/day: 0.25    Years: 25.00    Pack years: 6.25    Types: Cigarettes  . Smokeless tobacco: Former Systems developer  . Tobacco comment: quit date 2016  Substance and Sexual Activity  . Alcohol use: Not Currently    Comment: occasional beer   . Drug use: Yes    Types: Marijuana    Comment: 3 x a month uses marijuana  . Sexual activity: Yes  Other Topics Concern  . Not on file  Social History Narrative   Regular exercise-no   Caffeine Use-yes   Social Determinants of Health   Financial Resource Strain:   . Difficulty of Paying Living Expenses: Not on file  Food Insecurity:   . Worried About Charity fundraiser in the Last Year: Not on file  . Ran Out of Food in the Last Year: Not on file  Transportation Needs:   . Lack of Transportation (Medical): Not on file  . Lack of Transportation (Non-Medical): Not on file  Physical Activity:   . Days of Exercise per Week: Not on file  . Minutes of Exercise per Session: Not on file  Stress:   . Feeling of Stress : Not on file  Social Connections:   . Frequency of Communication with Friends and Family: Not on file  . Frequency of Social Gatherings with Friends and Family: Not on file  . Attends Religious Services: Not on file  . Active Member of Clubs or Organizations: Not on file  . Attends Archivist Meetings: Not on file  . Marital Status:  Not on file  Intimate Partner Violence:   . Fear of Current or Ex-Partner: Not on file  . Emotionally Abused: Not on file  . Physically Abused: Not on file  . Sexually Abused: Not on file    FAMILY HISTORY: Family History  Problem Relation Age of Onset  . Heart disease Sister   . Stroke Sister   . Colon cancer Paternal Uncle   . Diabetes Neg Hx     ALLERGIES:  is allergic to heparin and poison ivy extract [poison ivy extract].  MEDICATIONS:  Current Outpatient Medications  Medication Sig Dispense Refill  . abiraterone acetate (ZYTIGA) 250 MG tablet TAKE 4 TABLETS (1,000 MG TOTAL) BY MOUTH DAILY. TAKE ON AN EMPTY STOMACH 1 HOUR BEFORE OR 2 HOURS AFTER A MEAL 120 tablet  1  . denosumab (XGEVA) 120 MG/1.7ML SOLN injection Inject 120 mg into the skin once.    [name] leuprolide (LUPRON) 22.5 MG injection Inject 22.5 mg into the muscle every 3 (three) months.    . mupirocin ointment (BACTROBAN) 2 % Apply 1 application topically 4 (four) times daily. 30 g 0  . oxyCODONE-acetaminophen (PERCOCET) 10-325 MG tablet Take 1-2 tablets by mouth every 4 (four) hours as needed (cancer related painDO not). Do not exceed 10 tab a day 120 tablet 0  . predniSONE (DELTASONE) 5 MG tablet Take 1 tablet (5 mg total) by mouth daily with breakfast. 30 tablet 2  . senna-docusate (SENNA S) 8.6-50 MG tablet Take 2 tablets by mouth at bedtime. 60 tablet 1  . Vitamin D, Ergocalciferol, (DRISDOL) 1.25 MG (50000 UT) CAPS capsule Take 1 capsule (50,000 Units total) by mouth 2 (two) times a week. 24 capsule 6  . fentaNYL (DURAGESIC) 50 MCG/HR Place 1 patch onto the skin every 3 (three) days. 10 patch 0  . lidocaine (LIDODERM) 5 % Place 1 patch onto the skin daily. Apply to area of maximal pain over lower back. Remove & Discard patch within 12 hours or as directed by MD 30 patch 0   No current facility-administered medications for this visit.    REVIEW OF SYSTEMS:   A 10+ POINT REVIEW OF SYSTEMS WAS OBTAINED including  neurology, dermatology, psychiatry, cardiac, respiratory, lymph, extremities, GI, GU, Musculoskeletal, constitutional, breasts, reproductive, HEENT.  All pertinent positives are noted in the HPI.  All others are negative.  PHYSICAL EXAMINATION: ECOG FS:1 - Symptomatic but completely ambulatory  Vitals:   04/08/19 1227  BP: (!) 145/92  Pulse: 95  Resp: 18  Temp: 97.8 F (36.6 C)  SpO2: 99%   Wt Readings from Last 3 Encounters:  04/08/19 179 lb 1.6 oz (81.2 kg)  03/14/19 185 lb 9.6 oz (84.2 kg)  01/14/19 193 lb (87.5 kg)   Body mass index is 23.63 kg/m.    GENERAL:alert, in no acute distress and comfortable SKIN: no acute rashes, no significant lesions EYES: conjunctiva are pink and non-injected, sclera anicteric OROPHARYNX: MMM, no exudates, no oropharyngeal erythema or ulceration NECK: supple, no JVD LYMPH:  no palpable lymphadenopathy in the cervical, axillary or inguinal regions LUNGS: clear to auscultation b/l with normal respiratory effort HEART: regular rate & rhythm ABDOMEN:  normoactive bowel sounds , non tender, not distended. No palpable hepatosplenomegaly.  Extremity: no pedal edema PSYCH: alert & oriented x 3 with fluent speech NEURO: no focal motor/sensory deficits, mild left scapular tenderness, L 2-3 lumbar pain TTP , sacral tenderness to palpation  LABORATORY DATA:  I have reviewed the data as listed  . CBC Latest Ref Rng & Units 04/08/2019 03/14/2019 02/11/2019  WBC 4.0 - 10.5 K/uL 10.0 9.4 10.1  Hemoglobin 13.0 - 17.0 g/dL 12.9(L) 12.7(L) 13.2  Hematocrit 39.0 - 52.0 % 41.2 41.0 42.2  Platelets 150 - 400 K/uL 315 295 328    CMP Latest Ref Rng & Units 04/08/2019 03/14/2019 02/11/2019  Glucose 70 - 99 mg/dL 117(H) 98 110(H)  BUN 8 - 23 mg/dL _0 Creatinine 0.61 - 1.24 mg/dL 0.67 0.62 0.70  Sodium 135 - 145 mmol/L 140 140 135  Potassium 3.5 - 5.1 mmol/L 4.2 3.7 4.3  Chloride 98 - 111 mmol/L 105 105 101  CO2 22 - 32 mmol/L _1 Calcium 8.9 -  10.3 mg/dL 8.6(L) 8.4(L) 8.8(L)  Total Protein 6.5 - 8.1 g/dL 7.1 7.1  7.3  Total Bilirubin 0.3 - 1.2 mg/dL 0.4 0.5 0.3  Alkaline Phos 38 - 126 U/L 1,102(H) 1,060(H) 522(H)  AST 15 - 41 U/L 41 38 52(H)  ALT 0 - 44 U/L _0 .     08/14/2017 Peripheral Blood Flow Cytometry           RADIOGRAPHIC STUDIES: I have personally reviewed the radiological images as listed and agreed with the findings in the report. No results found.  ASSESSMENT & PLAN:   74 y.o. AAM with previous h/o of locally advanced pT3b,pN0 prostate cancer now with concern for newly noted metastatic prostate cancer.  1) Metastatic Prostate Cancer  PSA level 550 pre-treatment and have improved significantly and were down to 0.6. Now gradually rising again and if upt o  49.7  without any other associated new symptoms. Patient had been noted to have extensive osseous metastatic disease throughout the spine and bilaterally in the calvarium as well. Also noted to have local recurrence in the prostatic bed, retroperitoneal Lnadenoapathy and some mediastinal LNadenoapathy.  ECOG PS 1 Previously locally advanced pT3b,pN0 diagnosed in 10/2012 s/p radical prostatectomy and ADT x 1 yr. Testosterone level 7 -- is dropping appropriately with treatment. S/p 6 cycles of taxotere.  12/25/17 PET/CT revealed Solitary retroperitoneal lymph node with radiotracer activity adjacent the IVC most consistent with prostate cancer metastasis. 2. Distant nodal metastasis in the LEFT and RIGHT axilla. Not typical location for prostate cancer metastasis but the activity is intense and favor such. 3. New periosteal reaction within the RIGHT iliac bone and LEFT scapula with intense radiotracer activity consistent active skeletal metastasis. Additional active metastasis in the RIGHT mandible and proximal RIGHT femur.   2) Anterior mandibular pain  Orthopantogram done - showed IMPRESSION: Residual teeth 23-26 with decay.  No mandibular osseous  lesion seen Patient has completed dental extractions and is using dentures and  3) Elevated alkaline phosphatase level due to extensive bone mets.   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 Stable. -would anticipate mild anemia from ADT  5) Hematuria due to local recurrence of prostate cancer - 1 episode of hematuria - now resolved  6) Anorexia resolved.. Notes improved po intake with progressive weight gain. Patient notes he's been eating much better. . Wt Readings from Last 3 Encounters:  04/08/19 179 lb 1.6 oz (81.2 kg)  03/14/19 185 lb 9.6 oz (84.2 kg)  01/14/19 193 lb (87.5 kg)    7) Vit D deficiency with hypocalcemia -- levels are normalizing. -Being aggressively replaced since his calcium levels have dropped after Xgeva likely reflecting significant bone calcium deficits from extensive healing bone lesions. -Continue ergocalciferol 50,000U weekly  -Continue Vit D daily    8) Neoplasm related pain - much improved patient is has not needed much of his prescribed pain medications Plan  -Continue Percocet when necessary for neoplasm related bone pains, refilled today  -Senna S for bowel prophylaxis  9) Colonic thickening in transverse colon thought to be related to spasm/incomplete distension of colon in this area. -would recommend pursuing colonoscopy with PCP --pending  10) Left shoulder pain Patient has left shoulder pain s/p injury 3-4 months ago.  -XR left shoulder today- reviewed results - No fracture or dislocation is noted in the left shoulder. Sclerosis of scapula is noted consistent with metastatic disease. 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  -Pt appears to have progression in the lower lumbar spine or sacral region with nerve root compression causing severe pain radiating to b/l buttocks and thighs. -Recommend pt use an SI Belt  -Quick onset of symptoms could be due to a pinched nerve in the sacral region or a fracture. No overt cauda equina syndrome at this time. -Will refer to urgently to Radiation Oncology for palliative RT to the area to improve back pain ASAP -Will refer pt to home care assistance -Will give pt Toradol injection today for pain management  -Advised pt to use Lidocane Patch on localized lower back pain -Advised pt to use up to 2, 10 mg(Oxycodone)/ Percocet every 4 hours as needed for pain management  -Will increase Fentanyl patch dose to 50 mcg/hr  -Rx Lidocane patch -Discussed option for possible admission to the hospital for pain control and rapid work-up.  Patient declined this option and prefers to have outpatient work-up and treatment.  His friend who is a Marine scientist is with him in the clinic.  His pain was better controlled after Toradol shot. -Will get MRI Lumbar Spine and Sacrum ASAP -Will see back in 2 weeks   FOLLOW UP: -MRI lumbar spine and sacrum stat -Urgent referral to rad oncology for consideration of palliative RT to help control back pain -Changing from Xgeva to Zometa -- next dose in 4 weeks -RTC with Dr Irene Limbo with labs in 2 weeks   The total time spent in the appt was 45 minutes and more than 50% was on counseling and direct patient cares.  All of the patient's questions were answered with apparent satisfaction. The patient knows to call the clinic with any problems, questions or concerns.   Sullivan Lone MD [place] AAHIVMS [facility] Hematology/Oncology Physician [facility]  (Office):       [phone] (Work cell):  [phone] (Fax):           [phone]  I, Yevette Edwards, am  acting as a scribe for Dr. Sullivan Lone.   .I have reviewed the above documentation for accuracy and completeness, and I agree with the above. [name] MD

## 2019-04-07 NOTE — Telephone Encounter (Signed)
Patient friend/caretaker Beckey Downing called [phone].Patient in severe pain over weekend - crying and curled up per ms. Edmonds. Has pain in back, goes down legs & left shoulder area - not relieved by current meds (fentanyl 1mcg & percocet 10-325 q4). Wants to have radiation as discussed with Dr. Irene Limbo. Dr. Irene Limbo informed. Dr. Irene Limbo asked that patient be informed that he will evaluate patient at appointment tomorrow.  Contacted Ms. Edmonds with this information. She is going to try to come to MD appt with patient tomorrow.

## 2019-04-08 ENCOUNTER — Other Ambulatory Visit: Payer: Self-pay

## 2019-04-08 ENCOUNTER — Inpatient Hospital Stay: Payer: Medicare Other | Attending: Hematology

## 2019-04-08 ENCOUNTER — Ambulatory Visit: Payer: Medicare Other

## 2019-04-08 ENCOUNTER — Other Ambulatory Visit: Payer: Medicare Other

## 2019-04-08 ENCOUNTER — Inpatient Hospital Stay: Payer: Medicare Other

## 2019-04-08 ENCOUNTER — Inpatient Hospital Stay (HOSPITAL_BASED_OUTPATIENT_CLINIC_OR_DEPARTMENT_OTHER): Payer: Medicare Other | Admitting: Hematology

## 2019-04-08 ENCOUNTER — Telehealth: Payer: Self-pay | Admitting: Hematology

## 2019-04-08 VITALS — BP 145/92 | HR 95 | Temp 97.8°F | Resp 18 | Ht 73.0 in | Wt 179.1 lb

## 2019-04-08 DIAGNOSIS — G893 Neoplasm related pain (acute) (chronic): Secondary | ICD-10-CM | POA: Diagnosis not present

## 2019-04-08 DIAGNOSIS — Z5111 Encounter for antineoplastic chemotherapy: Secondary | ICD-10-CM | POA: Diagnosis not present

## 2019-04-08 DIAGNOSIS — Z7189 Other specified counseling: Secondary | ICD-10-CM | POA: Diagnosis not present

## 2019-04-08 DIAGNOSIS — C7951 Secondary malignant neoplasm of bone: Secondary | ICD-10-CM

## 2019-04-08 DIAGNOSIS — C61 Malignant neoplasm of prostate: Secondary | ICD-10-CM | POA: Diagnosis not present

## 2019-04-08 DIAGNOSIS — M25512 Pain in left shoulder: Secondary | ICD-10-CM | POA: Diagnosis not present

## 2019-04-08 DIAGNOSIS — M549 Dorsalgia, unspecified: Secondary | ICD-10-CM

## 2019-04-08 LAB — CMP (CANCER CENTER ONLY)
ALT: 6 U/L (ref 0–44)
AST: 41 U/L (ref 15–41)
Albumin: 4 g/dL (ref 3.5–5.0)
Alkaline Phosphatase: 1102 U/L — ABNORMAL HIGH (ref 38–126)
Anion gap: 9 (ref 5–15)
BUN: 12 mg/dL (ref 8–23)
CO2: 26 mmol/L (ref 22–32)
Calcium: 8.6 mg/dL — ABNORMAL LOW (ref 8.9–10.3)
Chloride: 105 mmol/L (ref 98–111)
Creatinine: 0.67 mg/dL (ref 0.61–1.24)
GFR, Est AFR Am: >60 mL/min (ref >60)
GFR, Estimated: >60 mL/min (ref >60)
Glucose, Bld: 117 mg/dL — ABNORMAL HIGH (ref 70–99)
Potassium: 4.2 mmol/L (ref 3.5–5.1)
Sodium: 140 mmol/L (ref 135–145)
Total Bilirubin: 0.4 mg/dL (ref 0.3–1.2)
Total Protein: 7.1 g/dL (ref 6.5–8.1)

## 2019-04-08 LAB — CBC WITH DIFFERENTIAL/PLATELET
Abs Immature Granulocytes: 0.03 10*3/uL (ref 0.00–0.07)
Basophils Absolute: 0 10*3/uL (ref 0.0–0.1)
Basophils Relative: 0 %
Eosinophils Absolute: 0.2 10*3/uL (ref 0.0–0.5)
Eosinophils Relative: 2 %
HCT: 41.2 % (ref 39.0–52.0)
Hemoglobin: 12.9 g/dL — ABNORMAL LOW (ref 13.0–17.0)
Immature Granulocytes: 0 %
Lymphocytes Relative: 59 %
Lymphs Abs: 5.8 10*3/uL — ABNORMAL HIGH (ref 0.7–4.0)
MCH: 28.5 pg (ref 26.0–34.0)
MCHC: 31.3 g/dL (ref 30.0–36.0)
MCV: 90.9 fL (ref 80.0–100.0)
Monocytes Absolute: 0.4 10*3/uL (ref 0.1–1.0)
Monocytes Relative: 4 %
Neutro Abs: 3.5 10*3/uL (ref 1.7–7.7)
Neutrophils Relative %: 35 %
Platelets: 315 10*3/uL (ref 150–400)
RBC: 4.53 MIL/uL (ref 4.22–5.81)
RDW: 15 % (ref 11.5–15.5)
WBC: 10 10*3/uL (ref 4.0–10.5)
nRBC: 0 % (ref 0.0–0.2)

## 2019-04-08 MED ORDER — KETOROLAC TROMETHAMINE 30 MG/ML IJ SOLN
INTRAMUSCULAR | Status: AC
  Filled 2019-04-08: qty 2

## 2019-04-08 MED ORDER — LIDOCAINE 5 % EX PTCH: 1.0000 | MEDICATED_PATCH | CUTANEOUS | 0 refills | Status: AC

## 2019-04-08 MED ORDER — LEUPROLIDE ACETATE (3 MONTH) 22.5 MG ~~LOC~~ KIT
22.5000 mg | PACK | Freq: Once | SUBCUTANEOUS | Status: AC
  Administered 2019-04-08: 14:00:00 22.5 mg via SUBCUTANEOUS
  Filled 2019-04-08: qty 22.5

## 2019-04-08 MED ORDER — DENOSUMAB 120 MG/1.7ML ~~LOC~~ SOLN
120.0000 mg | Freq: Once | SUBCUTANEOUS | Status: AC
  Administered 2019-04-08: 14:00:00 120 mg via SUBCUTANEOUS

## 2019-04-08 MED ORDER — OXYCODONE-ACETAMINOPHEN 10-325 MG PO TABS: 1.0000 | ORAL_TABLET | ORAL | 0 refills | Status: DC | PRN

## 2019-04-08 MED ORDER — KETOROLAC TROMETHAMINE 30 MG/ML IJ SOLN
60.0000 mg | Freq: Once | INTRAMUSCULAR | Status: AC
  Administered 2019-04-08: 60 mg via INTRAMUSCULAR

## 2019-04-08 MED ORDER — KETOROLAC TROMETHAMINE 60 MG/2ML IM SOLN: 60.0000 mg | Freq: Once | INTRAMUSCULAR | Status: DC

## 2019-04-08 MED ORDER — FENTANYL 50 MCG/HR TD PT72: 1.0000 | MEDICATED_PATCH | TRANSDERMAL | 0 refills | Status: DC

## 2019-04-08 MED ORDER — DENOSUMAB 120 MG/1.7ML ~~LOC~~ SOLN
SUBCUTANEOUS | Status: AC
  Filled 2019-04-08: qty 1.7

## 2019-04-08 NOTE — Patient Instructions (Signed)
Use Lidocane Patch on lower back pain for localized pain  Use up to 2 Percocet every 4 hours for pain management   Apply a 50 mcg/hr Fentanyl Patch once every three days

## 2019-04-08 NOTE — Patient Instructions (Signed)
Denosumab injection What is this medicine? DENOSUMAB (den oh sue mab) slows bone breakdown. Prolia is used to treat osteoporosis in women after menopause and in men, and in people who are taking corticosteroids for 6 months or more. Xgeva is used to treat a high calcium level due to cancer and to prevent bone fractures and other bone problems caused by multiple myeloma or cancer bone metastases. Xgeva is also used to treat giant cell tumor of the bone. This medicine may be used for other purposes; ask your health care provider or pharmacist if you have questions. COMMON BRAND NAME(S): Prolia, XGEVA What should I tell my health care provider before I take this medicine? They need to know if you have any of these conditions:  dental disease  having surgery or tooth extraction  infection  kidney disease  low levels of calcium or Vitamin D in the blood  malnutrition  on hemodialysis  skin conditions or sensitivity  thyroid or parathyroid disease  an unusual reaction to denosumab, other medicines, foods, dyes, or preservatives  pregnant or trying to get pregnant  breast-feeding How should I use this medicine? This medicine is for injection under the skin. It is given by a health care professional in a hospital or clinic setting. A special MedGuide will be given to you before each treatment. Be sure to read this information carefully each time. For Prolia, talk to your pediatrician regarding the use of this medicine in children. Special care may be needed. For Xgeva, talk to your pediatrician regarding the use of this medicine in children. While this drug may be prescribed for children as young as 13 years for selected conditions, precautions do apply. Overdosage: If you think you have taken too much of this medicine contact a poison control center or emergency room at once. NOTE: This medicine is only for you. Do not share this medicine with others. What if I miss a dose? It is  important not to miss your dose. Call your doctor or health care professional if you are unable to keep an appointment. What may interact with this medicine? Do not take this medicine with any of the following medications:  other medicines containing denosumab This medicine may also interact with the following medications:  medicines that lower your chance of fighting infection  steroid medicines like prednisone or cortisone This list may not describe all possible interactions. Give your health care provider a list of all the medicines, herbs, non-prescription drugs, or dietary supplements you use. Also tell them if you smoke, drink alcohol, or use illegal drugs. Some items may interact with your medicine. What should I watch for while using this medicine? Visit your doctor or health care professional for regular checks on your progress. Your doctor or health care professional may order blood tests and other tests to see how you are doing. Call your doctor or health care professional for advice if you get a fever, chills or sore throat, or other symptoms of a cold or flu. Do not treat yourself. This drug may decrease your body's ability to fight infection. Try to avoid being around people who are sick. You should make sure you get enough calcium and vitamin D while you are taking this medicine, unless your doctor tells you not to. Discuss the foods you eat and the vitamins you take with your health care professional. See your dentist regularly. Brush and floss your teeth as directed. Before you have any dental work done, tell your dentist you are   receiving this medicine. Do not become pregnant while taking this medicine or for 5 months after stopping it. Talk with your doctor or health care professional about your birth control options while taking this medicine. Women should inform their doctor if they wish to become pregnant or think they might be pregnant. There is a potential for serious side  effects to an unborn child. Talk to your health care professional or pharmacist for more information. What side effects may I notice from receiving this medicine? Side effects that you should report to your doctor or health care professional as soon as possible:  allergic reactions like skin rash, itching or hives, swelling of the face, lips, or tongue  bone pain  breathing problems  dizziness  jaw pain, especially after dental work  redness, blistering, peeling of the skin  signs and symptoms of infection like fever or chills; cough; sore throat; pain or trouble passing urine  signs of low calcium like fast heartbeat, muscle cramps or muscle pain; pain, tingling, numbness in the hands or feet; seizures  unusual bleeding or bruising  unusually weak or tired Side effects that usually do not require medical attention (report to your doctor or health care professional if they continue or are bothersome):  constipation  diarrhea  headache  joint pain  loss of appetite  muscle pain  runny nose  tiredness  upset stomach This list may not describe all possible side effects. Call your doctor for medical advice about side effects. You may report side effects to FDA at 1-800-FDA-1088. Where should I keep my medicine? This medicine is only given in a clinic, doctor's office, or other health care setting and will not be stored at home. NOTE: This sheet is a summary. It may not cover all possible information. If you have questions about this medicine, talk to your doctor, pharmacist, or health care provider.  2020 Elsevier/Gold Standard (2017-07-20 16:10:44) Ketorolac injection What is this medicine? KETOROLAC (kee toe ROLE ak) is a non-steroidal anti-inflammatory drug (NSAID). It is used to treat moderate to severe pain for up to 5 days. It is commonly used after surgery. This medicine should not be used for more than 5 days. This medicine may be used for other purposes; ask  your health care provider or pharmacist if you have questions. COMMON BRAND NAME(S): Toradol What should I tell my health care provider before I take this medicine? They need to know if you have any of these conditions:  asthma, especially aspirin-sensitive asthma  bleeding problems  coronary artery bypass graft (CABG) surgery within the past 2 weeks  kidney disease  stomach bleed, ulcer, or other problem  taking aspirin, other NSAID, or probenecid  an unusual or allergic reaction to ketorolac, tromethamine, aspirin, other NSAIDs, other medicines, foods, dyes or preservatives  pregnant or trying to get pregnant  breast-feeding How should I use this medicine? This medicine is for injection into a muscle or into a vein. It is given by a health care professional in a hospital or clinic setting. Talk to your pediatrician regarding the use of this medicine in children. Special care may be needed. Patients over 74 years old may have a stronger reaction and need a smaller dose. Overdosage: If you think you have taken too much of this medicine contact a poison control center or emergency room at once. NOTE: This medicine is only for you. Do not share this medicine with others. What if I miss a dose? This does not apply. What may  interact with this medicine? Do not take this medicine with any of the following medications:  aspirin and aspirin-like medicines  cidofovir  methotrexate  NSAIDs, medicines for pain and inflammation, like ibuprofen or naproxen  pentoxifylline  probenecid This medicine may also interact with the following medications:  alcohol  alendronate  alprazolam  carbamazepine  diuretics  flavocoxid  fluoxetine  ginkgo  lithium  medicines for blood pressure like enalapril  medicines that affect platelets like pentoxifylline  medicines that treat or prevent blood clots like heparin, warfarin  muscle  relaxants  pemetrexed  phenytoin  thiothixene This list may not describe all possible interactions. Give your health care provider a list of all the medicines, herbs, non-prescription drugs, or dietary supplements you use. Also tell them if you smoke, drink alcohol, or use illegal drugs. Some items may interact with your medicine. What should I watch for while using this medicine? Tell your doctor or healthcare provider if your symptoms do not start to get better or if they get worse. This medicine may cause serious skin reactions. They can happen weeks to months after starting the medicine. Contact your healthcare provider right away if you notice fevers or flu-like symptoms with a rash. The rash may be red or purple and then turn into blisters or peeling of the skin. Or, you might notice a red rash with swelling of the face, lips or lymph nodes in your neck or under your arms. This medicine does not prevent heart attack or stroke. In fact, this medicine may increase the chance of a heart attack or stroke. The chance may increase with longer use of this medicine and in people who have heart disease. If you take aspirin to prevent heart attack or stroke, talk with your doctor or healthcare provider. Do not take medicines such as ibuprofen and naproxen with this medicine. Side effects such as stomach upset, nausea, or ulcers may be more likely to occur. Many medicines available without a prescription should not be taken with this medicine. This medicine can cause ulcers and bleeding in the stomach and intestines at any time during treatment. Do not smoke cigarettes or drink alcohol. These increase irritation to your stomach and can make it more susceptible to damage from this medicine. Ulcers and bleeding can happen without warning symptoms and can cause death. This medicine can cause you to bleed more easily. Try to avoid damage to your teeth and gums when you brush or floss your teeth. What side  effects may I notice from receiving this medicine? Side effects that you should report to your doctor or health care professional as soon as possible:  allergic reactions like skin rash, itching or hives, swelling of the face, lips, or tongue  breathing problems  high blood pressure  nausea, vomiting  redness, blistering, peeling or loosening of the skin, including inside the mouth  severe stomach pain  signs and symptoms of bleeding such as bloody or black, tarry stools; red or dark-brown urine; spitting up blood or brown material that looks like coffee grounds; red spots on the skin; unusual bruising or bleeding from the eye, gums, or nose  signs and symptoms of a blood clot changes in vision; chest pain; severe, sudden headache; trouble speaking; sudden numbness or weakness of the face, arm, or leg  trouble passing urine or change in the amount of urine  unexplained weight gain or swelling  unusually weak or tired  yellowing of eyes or skin Side effects that usually do not  require medical attention (report to your doctor or health care professional if they continue or are bothersome):  diarrhea  dizziness  headache  heartburn This list may not describe all possible side effects. Call your doctor for medical advice about side effects. You may report side effects to FDA at 1-800-FDA-1088. Where should I keep my medicine? This drug is given in a hospital or clinic and will not be stored at home. NOTE: This sheet is a summary. It may not cover all possible information. If you have questions about this medicine, talk to your doctor, pharmacist, or health care provider.  2020 Elsevier/Gold Standard (2018-05-29 15:10:53) Leuprolide depot injection What is this medicine? LEUPROLIDE (loo PROE lide) is a man-made protein that acts like a natural hormone in the body. It decreases testosterone in men and decreases estrogen in women. In men, this medicine is used to treat advanced  prostate cancer. In women, some forms of this medicine may be used to treat endometriosis, uterine fibroids, or other male hormone-related problems. This medicine may be used for other purposes; ask your health care provider or pharmacist if you have questions. COMMON BRAND NAME(S): Eligard, Fensolv, Lupron Depot, Lupron Depot-Ped, Viadur What should I tell my health care provider before I take this medicine? They need to know if you have any of these conditions:  diabetes  heart disease or previous heart attack  high blood pressure  high cholesterol  mental illness  osteoporosis  pain or difficulty passing urine  seizures  spinal cord metastasis  stroke  suicidal thoughts, plans, or attempt; a previous suicide attempt by you or a family member  tobacco smoker  unusual vaginal bleeding (women)  an unusual or allergic reaction to leuprolide, benzyl alcohol, other medicines, foods, dyes, or preservatives  pregnant or trying to get pregnant  breast-feeding How should I use this medicine? This medicine is for injection into a muscle or for injection under the skin. It is given by a health care professional in a hospital or clinic setting. The specific product will determine how it will be given to you. Make sure you understand which product you receive and how often you will receive it. Talk to your pediatrician regarding the use of this medicine in children. Special care may be needed. Overdosage: If you think you have taken too much of this medicine contact a poison control center or emergency room at once. NOTE: This medicine is only for you. Do not share this medicine with others. What if I miss a dose? It is important not to miss a dose. Call your doctor or health care professional if you are unable to keep an appointment. Depot injections: Depot injections are given either once-monthly, every 12 weeks, every 16 weeks, or every 24 weeks depending on the product you are  prescribed. The product you are prescribed will be based on if you are male or male, and your condition. Make sure you understand your product and dosing. What may interact with this medicine? Do not take this medicine with any of the following medications:  chasteberry This medicine may also interact with the following medications:  herbal or dietary supplements, like black cohosh or DHEA  male hormones, like estrogens or progestins and birth control pills, patches, rings, or injections  male hormones, like testosterone This list may not describe all possible interactions. Give your health care provider a list of all the medicines, herbs, non-prescription drugs, or dietary supplements you use. Also tell them if you smoke, drink alcohol,  or use illegal drugs. Some items may interact with your medicine. What should I watch for while using this medicine? Visit your doctor or health care professional for regular checks on your progress. During the first weeks of treatment, your symptoms may get worse, but then will improve as you continue your treatment. You may get hot flashes, increased bone pain, increased difficulty passing urine, or an aggravation of nerve symptoms. Discuss these effects with your doctor or health care professional, some of them may improve with continued use of this medicine. Male patients may experience a menstrual cycle or spotting during the first months of therapy with this medicine. If this continues, contact your doctor or health care professional. This medicine may increase blood sugar. Ask your healthcare provider if changes in diet or medicines are needed if you have diabetes. What side effects may I notice from receiving this medicine? Side effects that you should report to your doctor or health care professional as soon as possible:  allergic reactions like skin rash, itching or hives, swelling of the face, lips, or tongue  breathing problems  chest  pain  depression or memory disorders  pain in your legs or groin  pain at site where injected or implanted  seizures  severe headache  signs and symptoms of high blood sugar such as being more thirsty or hungry or having to urinate more than normal. You may also feel very tired or have blurry vision  swelling of the feet and legs  suicidal thoughts or other mood changes  visual changes  vomiting Side effects that usually do not require medical attention (report to your doctor or health care professional if they continue or are bothersome):  breast swelling or tenderness  decrease in sex drive or performance  diarrhea  hot flashes  loss of appetite  muscle, joint, or bone pains  nausea  redness or irritation at site where injected or implanted  skin problems or acne This list may not describe all possible side effects. Call your doctor for medical advice about side effects. You may report side effects to FDA at 1-800-FDA-1088. Where should I keep my medicine? This drug is given in a hospital or clinic and will not be stored at home. NOTE: This sheet is a summary. It may not cover all possible information. If you have questions about this medicine, talk to your doctor, pharmacist, or health care provider.  2020 Elsevier/Gold Standard (2018-01-10 09:27:03)

## 2019-04-08 NOTE — Telephone Encounter (Signed)
Gave avs and calendar ° °

## 2019-04-10 ENCOUNTER — Ambulatory Visit (HOSPITAL_COMMUNITY)
Admission: RE | Admit: 2019-04-10 | Discharge: 2019-04-10 | Disposition: A | Discharge status: Home / Self Care | Payer: Medicare Other | Source: Ambulatory Visit | Attending: Hematology | Admitting: Hematology

## 2019-04-10 ENCOUNTER — Other Ambulatory Visit: Payer: Self-pay

## 2019-04-10 DIAGNOSIS — C61 Malignant neoplasm of prostate: Secondary | ICD-10-CM

## 2019-04-10 DIAGNOSIS — M549 Dorsalgia, unspecified: Secondary | ICD-10-CM | POA: Insufficient documentation

## 2019-04-10 DIAGNOSIS — G893 Neoplasm related pain (acute) (chronic): Secondary | ICD-10-CM

## 2019-04-10 DIAGNOSIS — C7951 Secondary malignant neoplasm of bone: Secondary | ICD-10-CM | POA: Diagnosis not present

## 2019-04-10 MED ORDER — GADOBUTROL 1 MMOL/ML IV SOLN
8.0000 mL | Freq: Once | INTRAVENOUS | Status: AC | PRN
  Administered 2019-04-10: 8 mL via INTRAVENOUS

## 2019-04-11 ENCOUNTER — Other Ambulatory Visit: Payer: Self-pay

## 2019-04-11 ENCOUNTER — Ambulatory Visit
Admission: RE | Admit: 2019-04-11 | Discharge: 2019-04-11 | Disposition: A | Discharge status: Home / Self Care | Payer: Medicare Other | Source: Ambulatory Visit | Attending: Radiation Oncology | Admitting: Radiation Oncology

## 2019-04-11 ENCOUNTER — Encounter: Payer: Self-pay | Admitting: Radiation Oncology

## 2019-04-11 ENCOUNTER — Other Ambulatory Visit: Payer: Self-pay | Admitting: Urology

## 2019-04-11 ENCOUNTER — Telehealth: Payer: Self-pay | Admitting: Hematology

## 2019-04-11 VITALS — BP 148/89 | HR 99 | Temp 97.8°F | Resp 20 | Ht 73.0 in | Wt 181.8 lb

## 2019-04-11 DIAGNOSIS — C61 Malignant neoplasm of prostate: Secondary | ICD-10-CM | POA: Diagnosis not present

## 2019-04-11 DIAGNOSIS — C7951 Secondary malignant neoplasm of bone: Secondary | ICD-10-CM

## 2019-04-11 DIAGNOSIS — Z9289 Personal history of other medical treatment: Secondary | ICD-10-CM | POA: Diagnosis not present

## 2019-04-11 DIAGNOSIS — Z510 Encounter for antineoplastic radiation therapy: Secondary | ICD-10-CM | POA: Insufficient documentation

## 2019-04-11 DIAGNOSIS — G893 Neoplasm related pain (acute) (chronic): Secondary | ICD-10-CM | POA: Insufficient documentation

## 2019-04-11 DIAGNOSIS — C7952 Secondary malignant neoplasm of bone marrow: Secondary | ICD-10-CM

## 2019-04-11 DIAGNOSIS — Z8042 Family history of malignant neoplasm of prostate: Secondary | ICD-10-CM | POA: Diagnosis not present

## 2019-04-11 HISTORY — DX: Malignant neoplasm of prostate: C61

## 2019-04-11 MED ORDER — DEXAMETHASONE 4 MG PO TABS: 4.0000 mg | ORAL_TABLET | Freq: Two times a day (BID) | ORAL | 0 refills | Status: DC

## 2019-04-11 MED ORDER — MORPHINE SULFATE 4 MG/ML IJ SOLN
2.0000 mg | Freq: Once | INTRAMUSCULAR | Status: AC
  Administered 2019-04-11: 2 mg via INTRAMUSCULAR
  Filled 2019-04-11: qty 1

## 2019-04-11 MED ORDER — MORPHINE SULFATE (PF) 4 MG/ML IV SOLN
2.0000 mg | Freq: Once | INTRAVENOUS | Status: AC
  Filled 2019-04-11: qty 0.5

## 2019-04-11 NOTE — Telephone Encounter (Signed)
I called Mr. Eric Osborn on his phone. 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 He notes that his pain is much better controlled with the pain medication changes. He has been kindly seen by Dr. Ledon Snare today for urgent consideration of palliative radiation therapy which she notes she will be starting on Tuesday. I did send a prescription to his pharmacy and informed him of starting dexamethasone 4 mg with breakfast and lunch to help reduce some tumor swelling and for pain management. He has a follow-up with [place] in about 7 to 10 days. He is aware of calling us if anything changes.  [name]

## 2019-04-11 NOTE — Progress Notes (Signed)
Radiation Oncology         [phone]) [phone] ________________________________  Initial outpatient Consultation - Conducted via in-office WebEx due to current COVID-19 concerns for limiting patient exposure  Name: Eric Osborn. MRN: [id number]  Date: 04/11/2019  DOB: 17-Aug-1945  JK:[id number], Coralie Keens, NP  [name], MD   REFERRING PHYSICIAN: [name], MD  DIAGNOSIS: 74 y.o. 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 Secondary malignant neoplasm of bone and bone marrow (HCC)  C79.51 morphine 4 MG/ML injection 2 mg   C79.52   2. 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 Bone metastases (HCC)  C79.51     HISTORY OF PRESENT ILLNESS: Eric Osborn. is a 74 y.o. male with a diagnosis of prostate cancer. He was initially diagnosed with prostate cancer in 2014 by Dr. [name]. He opted to proceed with RALP on 11/20/2012, with pathology revealing: Gleason 4+3 prostate cancer with tertiary pattern 5, extending to left margins at multiple foci, involving cauterized bladder neck margin, extraprostatic extension, angiolymphatic invasion, and seminal vesicle involvement. Negative lymph nodes (0/5). He reports he received a year of Lupron and no adjuvant radiation therapy. He was then lost to follow up thereafter.  He presented to the ED on 05/03/2016 with worsening mid and low back pain, as well as a 15-20 lb weight loss over 6-8 weeks and hematuria for 10 days. Abdomen/pelvis CT performed at that time revealed: innumerable sclerotic lesions in all imaged bones; irregular appearing prostate gland; a few enlarged retroperitoneal lymph nodes; thickened transverse colon walls.  He was referred to Dr. Irene Limbo on 05/08/2016 and a PSA performed that day was significantly elevated at 549 with an ALK of 2221. CT Chest and bone scan were performed on 05/12/2016 to complete his disease staging and revealed mediastinal  lymphadenopathy and extensive osseous metastases throughout the spine and bilaterally in the calvarium. He was treated with 6 cycles of taxotere, in addition to Foster every 4 weeks and Lupron every 3 months. His Delton See was held briefly for dental extractions, but this was resumed after he had recovered from his procedure.  He had a good response to treatment early on with a decreased PSA of 7.1 in 07/14/2016 with a nadir of 0.6 in 8 of 2018.  Unfortunately, the PSA began rising again up to 3.9 in 04/2017, 14.9 in 5 of 2019 and 49 and 8 of 2019.  He had a PET scan in October 2019 which showed diffuse bone metastases so he was started on Xtandi with a good response until March 2020 when his PSA began rising again.  His PSA had increased again to 14.2 in October 2020, so the Gillermina Phy was discontinued and he was switched to [place] in 01/2019.  Repeat PET scan performed on 01/30/2019 revealed an increase in robust periosteal reaction involving the left scapula and clavicle as well as the right hemipelvis with persistent peripheral radiotracer activity and additional foci of sclerosis in pelvis and spine.  There appeared to be a positive response within the lymph nodes.  Repeat PSA on 03/04/2019 was stable at 13.8.  His bone directed therapy was switched from Xgeva to [place] on 04/08/2019. At that visit, he reported that severe pain was starting to return in his lumbar spine/sacrum that was no longer controlled with the Percocet he had been taking. He proceeded to lumbar spine MRI on 04/10/2019 which revealed diffuse sclerotic metastatic  disease without compression deformity; circumferential epidural disease at L3 resulting in severe canal stenosis with extension into the L3-4 neural foramina and sacral epidural disease with involvement of the S2 foramina. Sacral MRI also performed that same day revealed epidural tumor anteriorly in the sacral spinal canal at S2 measuring 2.7 cm, with mass effect on the S2 nerve roots and  below with extensive multifocal bony metastases similar to prior studies with expansile lesions in right iliac bone and right ischium and extraosseous extension of tumor adjacent to the right iliac bone.   The patient reviewed the recent imaging results with his oncologist and he has kindly been referred today for discussion of potential palliative radiation treatment options to help manage his low back pain.   PREVIOUS RADIATION THERAPY: No  PAST MEDICAL HISTORY:  Past Medical History:  Diagnosis Date  . Arthritis    left knee, left shoulder  . Erectile dysfunction 06/25/2012  . Foley catheter in place   . Goiter 06/25/2012  . H/O hiatal hernia   . Hematuria    occasional  . Hemorrhoid   . History of bladder infections   . Joint pain   . Prostate cancer ([facility])       PAST SURGICAL HISTORY: Past Surgical History:  Procedure Laterality Date  . INGUINAL HERNIA REPAIR  8 yrs ago  . LYMPHADENECTOMY Bilateral 11/20/2012   Procedure: WITH BILATERAL PELVIC LYMPH NODE DISSECTION ;  Surgeon: [name], MD;  Location: [facility];  Service: Urology;  Laterality: Bilateral;  . ROBOT ASSISTED LAPAROSCOPIC RADICAL PROSTATECTOMY N/A 11/20/2012   Procedure: ROBOTIC ASSISTED LAPAROSCOPIC RADICAL PROSTATECTOMY;  Surgeon: [name], MD;  Location: [facility];  Service: Urology;  Laterality: N/A;    FAMILY HISTORY:  Family History  Problem Relation Age of Onset  . Heart disease Sister   . Stroke Sister   . Colon cancer Paternal Uncle   . Cancer Daughter        unknown  . Prostate cancer Paternal Uncle   . Diabetes Neg Hx   . Breast cancer Neg Hx     SOCIAL HISTORY:  Social History   Socioeconomic History  . Marital status: Divorced    Spouse name: Not on file  . Number of children: 3  . Years of education: 9  . Highest education level: Not on file  Occupational History  . Occupation: Retired    Comment: textile  Tobacco Use  . Smoking status: Former Smoker    Packs/day: 0.25     Years: 25.00    Pack years: 6.25    Types: Cigarettes    Quit date: 03/27/2014    Years since quitting: 5.0  . Smokeless tobacco: Former Systems developer  . Tobacco comment: quit date 2016  Substance and Sexual Activity  . Alcohol use: Not Currently    Comment: occasional beer   . Drug use: Yes    Types: Marijuana    Comment: 3 x a month uses marijuana  . Sexual activity: Not Currently  Other Topics Concern  . Not on file  Social History Narrative   Regular exercise-no   Caffeine Use-yes   Social Determinants of Health   Financial Resource Strain:   . Difficulty of Paying Living Expenses: Not on file  Food Insecurity:   . Worried About Charity fundraiser in the Last Year: Not on file  . Ran Out of Food in the Last Year: Not on file  Transportation Needs:   . Lack of Transportation (Medical):  Not on file  . Lack of Transportation (Non-Medical): Not on file  Physical Activity:   . Days of Exercise per Week: Not on file  . Minutes of Exercise per Session: Not on file  Stress:   . Feeling of Stress : Not on file  Social Connections:   . Frequency of Communication with Friends and Family: Not on file  . Frequency of Social Gatherings with Friends and Family: Not on file  . Attends Religious Services: Not on file  . Active Member of Clubs or Organizations: Not on file  . Attends Archivist Meetings: Not on file  . Marital Status: Not on file  Intimate Partner Violence:   . Fear of Current or Ex-Partner: Not on file  . Emotionally Abused: Not on file  . Physically Abused: Not on file  . Sexually Abused: Not on file    ALLERGIES: Heparin and Poison ivy extract [poison ivy extract]  MEDICATIONS:  Current Outpatient Medications  Medication Sig Dispense Refill  . abiraterone acetate (ZYTIGA) 250 MG tablet TAKE 4 TABLETS (1,000 MG TOTAL) BY MOUTH DAILY. TAKE ON AN EMPTY STOMACH 1 HOUR BEFORE OR 2 HOURS AFTER A MEAL 120 tablet 1  . denosumab (XGEVA) 120 MG/1.7ML SOLN injection  Inject 120 mg into the skin once.    . fentaNYL (DURAGESIC) 50 MCG/HR Place 1 patch onto the skin every 3 (three) days. 10 patch 0  . leuprolide (LUPRON) 22.5 MG injection Inject 22.5 mg into the muscle every 3 (three) months.    . lidocaine (LIDODERM) 5 % Place 1 patch onto the skin daily. Apply to area of maximal pain over lower back. Remove & Discard patch within 12 hours or as directed by MD 30 patch 0  . mupirocin ointment (BACTROBAN) 2 % Apply 1 application topically 4 (four) times daily. 30 g 0  . oxyCODONE-acetaminophen (PERCOCET) 10-325 MG tablet Take 1-2 tablets by mouth every 4 (four) hours as needed (cancer related painDO not). Do not exceed 10 tab a day 120 tablet 0  . predniSONE (DELTASONE) 5 MG tablet Take 1 tablet (5 mg total) by mouth daily with breakfast. 30 tablet 2  . senna-docusate (SENNA S) 8.6-50 MG tablet Take 2 tablets by mouth at bedtime. 60 tablet 1  . Vitamin D, Ergocalciferol, (DRISDOL) 1.25 MG (50000 UT) CAPS capsule Take 1 capsule (50,000 Units total) by mouth 2 (two) times a week. 24 capsule 6  . dexamethasone (DECADRON) 4 MG tablet Take 1 tablet (4 mg total) by mouth 2 (two) times daily with breakfast and lunch. 60 tablet 0   Current Facility-Administered Medications  Medication Dose Route Frequency Provider Last Rate Last Admin  . morphine 4 MG/ML injection 2 mg  2 mg Intramuscular Once Bruning, Ashlyn, PA-C        REVIEW OF SYSTEMS:  On review of systems, the patient reports that he is doing well overall aside from poorly controlled low back pain that is worse with sitting. He denies any chest pain, shortness of breath, cough, fevers, chills, night sweats, unintended weight changes. He denies any bowel disturbances, and denies abdominal pain, nausea or vomiting. He reports occasional bladder leakage for the past year and some constipation related to his pain medicine, unchanged recently. His main complaint is his low back pain, which also includes intermittent  numbness and tingling from bilateral hips to knees, that's worse first thing in the morning and seems to improve throughout the day once he gets up and moving.  He rates  the pain at its highest at 10/10, improved to 6/10 with pain meds. He denies any focal weakness in his upper or lower extremities and denies other sites of focal pain, specifically denies pain the the right hip or right iliac region.  A complete review of systems is obtained and is otherwise negative.   PHYSICAL EXAM:  Wt Readings from Last 3 Encounters:  04/11/19 181 lb 12.8 oz (82.5 kg)  04/08/19 179 lb 1.6 oz (81.2 kg)  03/14/19 185 lb 9.6 oz (84.2 kg)   Temp Readings from Last 3 Encounters:  04/11/19 97.8 F (36.6 C)  04/08/19 97.8 F (36.6 C) (Temporal)  03/14/19 98.2 F (36.8 C) (Temporal)   BP Readings from Last 3 Encounters:  04/11/19 (!) 148/89  04/08/19 (!) 145/92  03/14/19 135/88   Pulse Readings from Last 3 Encounters:  04/11/19 99  04/08/19 95  03/14/19 86   Pain Assessment Pain Score: 10-Worst pain ever Pain Frequency: Constant/10  In general this is a well appearing African American male in no acute distress. He's alert and oriented x4 and appropriate throughout the examination. Cardiopulmonary assessment is negative for acute distress and he exhibits normal effort. Sensation is intact to light touch in bilateral LEs and strength is 5/5 and equal bilaterally.   KPS = 70  100 - Normal; no complaints; no evidence of disease. 90   - Able to carry on normal activity; minor signs or symptoms of disease. 80   - Normal activity with effort; some signs or symptoms of disease. 21   - Cares for self; unable to carry on normal activity or to do active work. 60   - Requires occasional assistance, but is able to care for most of his personal needs. 50   - Requires considerable assistance and frequent medical care. 24   - Disabled; requires special care and assistance. 67   - Severely disabled; hospital  admission is indicated although death not imminent. 38   - Very sick; hospital admission necessary; active supportive treatment necessary. 10   - Moribund; fatal processes progressing rapidly. 0     - Dead  Karnofsky DA, [place], Craver LS and Burchenal [facility] [phone]) The use of the nitrogen mustards in the palliative treatment of carcinoma: with particular reference to bronchogenic carcinoma Cancer 1 634-56  LABORATORY DATA:  Lab Results  Component Value Date   WBC 10.0 04/08/2019   HGB 12.9 (L) 04/08/2019   HCT 41.2 04/08/2019   MCV 90.9 04/08/2019   PLT 315 04/08/2019   Lab Results  Component Value Date   NA 140 04/08/2019   K 4.2 04/08/2019   CL 105 04/08/2019   CO2 26 04/08/2019   Lab Results  Component Value Date   ALT 6 04/08/2019   AST 41 04/08/2019   ALKPHOS 1,102 (H) 04/08/2019   BILITOT 0.4 04/08/2019     RADIOGRAPHY: MR Lumbar Spine W Wo Contrast  Result Date: 04/10/2019 CLINICAL DATA:  Metastatic prostate cancer with severe new low back pain EXAM: MRI LUMBAR SPINE WITHOUT AND WITH CONTRAST TECHNIQUE: Multiplanar and multiecho pulse sequences of the lumbar spine were obtained without and with intravenous contrast. CONTRAST:  66m GADAVIST GADOBUTROL 1 MMOL/ML IV SOLN COMPARISON:  None. FINDINGS: Segmentation:  Standard. Alignment:  No significant listhesis. Vertebrae: There is multifocal abnormal T1 and T2 signal with patchy STIR hyperintensity consistent with sclerotic osseous metastatic disease. There is no compression deformity. Circumferential epidural disease, greatest ventrally at the L3 level. This results in severe canal  stenosis. There is also epidural disease ventrally at the sacrum extending from approximately S1-S2 to S2-S3. Partially imaged involvement of bilateral S2 foramina. Conus medullaris and cauda equina: Conus extends to the L1-L2 level. No abnormal intrathecal enhancement. Paraspinal and other soft tissues: Partially imaged metastatic disease of the  iliac bones with prominent extraosseous extension on the right. Disc levels: L1-L2:  No canal or foraminal stenosis. L2-L3:  Disc bulge.  No canal or foraminal stenosis. L3-L4: Disc bulge and mild facet arthropathy. Moderate to marked degenerative canal stenosis. Extraosseous extension of disease described above involves bilateral neural foramina. L4-L5: Disc bulge eccentric to the right with endplate osteophytic ridging and mild facet arthropathy. No canal stenosis. Mild to moderate right foraminal stenosis. No left foraminal stenosis L5-S1: Disc bulge with endplate osteophytic ridging and mild facet arthropathy. No canal stenosis. Mild right and moderate left foraminal stenosis. IMPRESSION: Diffuse sclerotic metastatic disease.  No compression deformity. Circumferential epidural disease at the L3 level results in severe canal stenosis. There is also extension into the L3-L4 neural foramina. Sacral epidural disease with involvement of S2 foramina. Multilevel degenerative changes as detailed above. These results will be called to the ordering clinician or representative by the Radiologist Assistant, and communication documented in the PACS or zVision Dashboard. Electronically Signed   By: Macy Mis M.D.   On: 04/10/2019 18:32   MR SACRUM SI JOINTS W WO CONTRAST  Result Date: 04/11/2019 CLINICAL DATA:  Metastatic prostate cancer, low back pain and point tenderness. EXAM: MRI SACRUM WITHOUT CONTRAST TECHNIQUE: Multiplanar multisequence MR imaging of the pelvis was performed. No intravenous contrast was administered. COMPARISON:  PET-CT from 01/30/2019 and MRI lumbar spine from 04/10/2019 FINDINGS: Osseous structures: Extensive multifocal metastatic involvement in the lower lumbar spine, bony pelvis, and right proximal femur with low signal intensity lesions predominating. Expansile lesions of the right iliac bone extending down into the right ischium as noted on prior PET-CT, with the resulting width of the  right iliac bone at about 5.8 cm as compared to the non expanded 2.2 cm of the left side. There is considerable extraosseous extension of tumor adjacent to the right iliac bone, with soft tissue enhancing tumor deep to the iliacus muscle measuring up to 2.8 cm in thickness on image 7/10, as well as a small rind of tumor posterior to the right iliac bone up to 0.9 cm thickness. The expansion of the iliac bone and right acetabulum causes medial convexity of the acetabulum along the margin of the obturator internus muscle. Lesions in the right proximal femur have a similar configuration to the prior PET-CT of 01/30/2019. Sacral spinal canal: Epidural tumor completely effaces the sacral spinal canal primarily at the S2 level as shown on image 14/12, with tumor in this vicinity measuring 2.7 cm along the axis of the canal, 1.0 cm in thickness, and 2.6 cm transversely. This tumor extends partially into the medial aspect of the S2 neural foramina with likely impingement on the S2 spinal nerves, and the resulting effacement of the sacral component of the thecal sac is likely impinging on the lower sacral nerve roots. Musculotendinous: There is mass effect from the extraosseous spread of tumor from the right iliac bone along the iloipsoas. Generalized moderate regional muscular atrophy in the pelvis. Low-level edema signal in the piriformis musculature (right greater than left) and along the portions of the right gluteal musculature close to the iliac bone. Other: Prostatectomy. IMPRESSION: 1. Epidural tumor anteriorly in the sacral spinal canal at the S2 level  measuring 2.7 by 1.0 by 2.6 cm, with mass effect on the S2 nerve roots and below due to effacement of the sacral spinal canal. Minimal extension into the medial S2 neural foramina. 2. Extensive multifocal metastatic involvement in the lower lumbar spine, bony pelvis, and right proximal femur similar to that noted previously, with expansile lesions in the right iliac  bone and right ischium. 3. Extraosseous extension of tumor adjacent to the right iliac bone primarily along the deep margin of the iloipsoas, but also posteriorly adjacent to the gluteal musculature. 4. Nonspecific low-grade edema in the piriformis musculature, right greater than left, possibly neurogenic. Electronically Signed   By: Van Clines M.D.   On: 04/11/2019 08:09      IMPRESSION/PLAN: This visit was conducted via in-office WebEx to spare the patient unnecessary potential exposure in the healthcare setting during the current COVID-19 pandemic. 1. 74 y.o. 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 Today, we talked to the patient about the findings and workup thus far. We discussed the natural history of metastatic prostate cancer and general treatment, highlighting the role of palliative radiotherapy in the management of painful bony metastases. We discussed the available radiation techniques, and focused on the details and logistics of delivery. The recommendation is for a 2 week course of daily radiotherapy directed to the painful sites of metastatic disease at L2-L3 and S2. We reviewed the anticipated acute and late sequelae associated with radiation in this setting. The patient was encouraged to ask questions that were answered to his satisfaction.  At the end of the conversation the patient is interested in moving forward with palliative radiation therapy to the painful spinal metastasis. He will proceed to CT simulation following our discussion today, in anticipation of beginning his daily treatments on Monday 04/14/19. He appears to have a good understanding of his disease and our recommendations for treatment which are of palliative intent.  He is comfortable and in agreement with the stated plan and has freely signed written consent to proceed today in the office.  A copy of this document will be placed in his medical record.  We will  share our discussion with Dr. Irene Limbo and proceed with treatment planning accordingly.    Nicholos Johns, PA-C    Tyler Pita, MD  [facility] Direct Dial: [phone]  Fax: [phone] [email or web address]  Skype  [email or web address]  This document serves as a record of services personally performed by Tyler Pita, MD and Freeman Caldron, PA-C. It was created on their behalf by Wilburn Mylar, a trained medical scribe. The creation of this record is based on the scribe's personal observations and the provider's statements to them. This document has been checked and approved by the attending provider.

## 2019-04-11 NOTE — Progress Notes (Signed)
Patient reports lumbar spine pain 100 on a scale of 0-10. Administered 2 mg morphine IM left deltoid as ordered by Ashlyn Bruning, PA-C. Hopeful this will help ease patient's pain during simulation. Instructed patient not to drive for a minimum of four hours following this injection. Patient verbalized understanding.

## 2019-04-11 NOTE — Progress Notes (Signed)
  Radiation Oncology         [phone]) [phone] ________________________________  Name: Eric Osborn. MRN: [id number]  Date: 04/11/2019  DOB: 07-23-1945  SIMULATION AND TREATMENT PLANNING NOTE    ICD-10-CM   1. Bone metastases (HCC)  C79.51     DIAGNOSIS:  74 y.o. patient with lumbosacral metastasis from stage IV prostate cancer.  NARRATIVE:  The patient was brought to the [facility].  Identity was confirmed.  All relevant records and images related to the planned course of therapy were reviewed.  The patient freely provided informed written consent to proceed with treatment after reviewing the details related to the planned course of therapy. The consent form was witnessed and verified by the simulation staff.  Then, the patient was set-up in a stable reproducible  supine position for radiation therapy.  CT images were obtained.  Surface markings were placed.  The CT images were loaded into the planning software.  Then the target and avoidance structures were contoured including kidneys.  Treatment planning then occurred.  The radiation prescription was entered and confirmed.  Then, I designed and supervised the construction of a total of 3 medically necessary complex treatment devices with VacLoc positioner and 2 MLCs to shield kidneys.  I have requested : 3D Simulation  I have requested a DVH of the following structures: Left Kidney, Right Kidney and target.  PLAN:  The patient will receive 30 Gy in 10 fractions to L2-S3  ________________________________  Sheral Apley. Tammi Klippel, M.D.

## 2019-04-11 NOTE — Progress Notes (Signed)
Histology and Location of Primary Cancer: prostatic adenocarcinoma  Sites of Visceral and Bony Metastatic Disease: bone  Location(s) of Symptomatic Metastases: lumbar  Past/Anticipated chemotherapy by medical oncology, if any: Lupron, Xgeva, Zytiga  Pain on a scale of 0-10 is: Reports low back pain constant worse first thing in the morning 100 on a scale of 0-10. Patient reports fentanyl and percocet help but don't relieve pain totally.   If Spine Met(s), symptoms, if any, include:  Bowel/Bladder retention or incontinence (please describe): Reports occasional bladder leakage. Reports some constipation related to pain medication. Reports using as stool softener and exlax.   Numbness or weakness in extremities (please describe): Reports numbness and tingling from bilateral hips to knees worse first thing in the morning.  Current Decadron regimen, if applicable: not taking decadron but does take prednisone 5 mg daily  Ambulatory status? Walker? Wheelchair?: Ambulatory  SAFETY ISSUES:  Prior radiation? no  Pacemaker/ICD? no  Possible current pregnancy? no, male patient  Is the patient on methotrexate? no  Current Complaints / other details:  75 year old male.

## 2019-04-11 NOTE — Progress Notes (Signed)
See progress note under physician encounter. 

## 2019-04-14 ENCOUNTER — Ambulatory Visit: Payer: Medicare Other

## 2019-04-15 ENCOUNTER — Ambulatory Visit: Payer: Medicare Other

## 2019-04-15 ENCOUNTER — Other Ambulatory Visit: Payer: Self-pay

## 2019-04-15 ENCOUNTER — Ambulatory Visit
Admission: RE | Admit: 2019-04-15 | Discharge: 2019-04-15 | Disposition: A | Discharge status: Home / Self Care | Payer: Medicare Other | Source: Ambulatory Visit | Attending: Radiation Oncology | Admitting: Radiation Oncology

## 2019-04-15 DIAGNOSIS — C61 Malignant neoplasm of prostate: Secondary | ICD-10-CM | POA: Diagnosis not present

## 2019-04-15 DIAGNOSIS — C7951 Secondary malignant neoplasm of bone: Secondary | ICD-10-CM | POA: Diagnosis not present

## 2019-04-15 DIAGNOSIS — Z510 Encounter for antineoplastic radiation therapy: Secondary | ICD-10-CM | POA: Diagnosis not present

## 2019-04-16 ENCOUNTER — Other Ambulatory Visit: Payer: Self-pay

## 2019-04-16 ENCOUNTER — Ambulatory Visit
Admission: RE | Admit: 2019-04-16 | Discharge: 2019-04-16 | Disposition: A | Discharge status: Home / Self Care | Payer: Medicare Other | Source: Ambulatory Visit | Attending: Radiation Oncology | Admitting: Radiation Oncology

## 2019-04-16 DIAGNOSIS — Z510 Encounter for antineoplastic radiation therapy: Secondary | ICD-10-CM | POA: Diagnosis not present

## 2019-04-16 DIAGNOSIS — C61 Malignant neoplasm of prostate: Secondary | ICD-10-CM | POA: Diagnosis not present

## 2019-04-16 DIAGNOSIS — C7951 Secondary malignant neoplasm of bone: Secondary | ICD-10-CM | POA: Diagnosis not present

## 2019-04-17 ENCOUNTER — Telehealth: Payer: Self-pay | Admitting: *Deleted

## 2019-04-17 ENCOUNTER — Ambulatory Visit
Admission: RE | Admit: 2019-04-17 | Discharge: 2019-04-17 | Disposition: A | Discharge status: Home / Self Care | Payer: Medicare Other | Source: Ambulatory Visit | Attending: Radiation Oncology | Admitting: Radiation Oncology

## 2019-04-17 ENCOUNTER — Other Ambulatory Visit: Payer: Self-pay

## 2019-04-17 ENCOUNTER — Other Ambulatory Visit: Payer: Self-pay | Admitting: Hematology

## 2019-04-17 DIAGNOSIS — Z510 Encounter for antineoplastic radiation therapy: Secondary | ICD-10-CM | POA: Diagnosis not present

## 2019-04-17 DIAGNOSIS — C61 Malignant neoplasm of prostate: Secondary | ICD-10-CM | POA: Diagnosis not present

## 2019-04-17 DIAGNOSIS — C7951 Secondary malignant neoplasm of bone: Secondary | ICD-10-CM | POA: Diagnosis not present

## 2019-04-17 MED ORDER — OXYCODONE HCL 10 MG PO TABS: 10.0000 mg | ORAL_TABLET | ORAL | 0 refills | Status: DC | PRN

## 2019-04-17 NOTE — Telephone Encounter (Signed)
Patient here at [facility] for [facility] treatment. He states he needs a refill of percocet, stating he has 2 pills left and is in "terrible pain". Percocet last filled on 1/12 - take 1-2 tablets q4h, no more than 10/day. Information given to Dr. Irene Limbo.

## 2019-04-18 ENCOUNTER — Ambulatory Visit
Admission: RE | Admit: 2019-04-18 | Discharge: 2019-04-18 | Disposition: A | Discharge status: Home / Self Care | Payer: Medicare Other | Source: Ambulatory Visit | Attending: Radiation Oncology | Admitting: Radiation Oncology

## 2019-04-18 ENCOUNTER — Other Ambulatory Visit: Payer: Self-pay

## 2019-04-18 DIAGNOSIS — Z510 Encounter for antineoplastic radiation therapy: Secondary | ICD-10-CM | POA: Diagnosis not present

## 2019-04-18 DIAGNOSIS — C61 Malignant neoplasm of prostate: Secondary | ICD-10-CM | POA: Diagnosis not present

## 2019-04-18 DIAGNOSIS — C7951 Secondary malignant neoplasm of bone: Secondary | ICD-10-CM | POA: Diagnosis not present

## 2019-04-21 ENCOUNTER — Ambulatory Visit
Admission: RE | Admit: 2019-04-21 | Discharge: 2019-04-21 | Disposition: A | Discharge status: Home / Self Care | Payer: Medicare Other | Source: Ambulatory Visit | Attending: Radiation Oncology | Admitting: Radiation Oncology

## 2019-04-21 ENCOUNTER — Encounter: Payer: Medicare Other | Admitting: Nutrition

## 2019-04-21 ENCOUNTER — Other Ambulatory Visit: Payer: Self-pay

## 2019-04-21 DIAGNOSIS — Z510 Encounter for antineoplastic radiation therapy: Secondary | ICD-10-CM | POA: Diagnosis not present

## 2019-04-21 DIAGNOSIS — C7951 Secondary malignant neoplasm of bone: Secondary | ICD-10-CM | POA: Diagnosis not present

## 2019-04-21 DIAGNOSIS — C61 Malignant neoplasm of prostate: Secondary | ICD-10-CM | POA: Diagnosis not present

## 2019-04-22 ENCOUNTER — Other Ambulatory Visit: Payer: Self-pay

## 2019-04-22 ENCOUNTER — Ambulatory Visit
Admission: RE | Admit: 2019-04-22 | Discharge: 2019-04-22 | Disposition: A | Discharge status: Home / Self Care | Payer: Medicare Other | Source: Ambulatory Visit | Attending: Radiation Oncology | Admitting: Radiation Oncology

## 2019-04-22 DIAGNOSIS — Z510 Encounter for antineoplastic radiation therapy: Secondary | ICD-10-CM | POA: Diagnosis not present

## 2019-04-22 DIAGNOSIS — C7951 Secondary malignant neoplasm of bone: Secondary | ICD-10-CM | POA: Diagnosis not present

## 2019-04-22 DIAGNOSIS — C61 Malignant neoplasm of prostate: Secondary | ICD-10-CM | POA: Diagnosis not present

## 2019-04-23 ENCOUNTER — Ambulatory Visit
Admission: RE | Admit: 2019-04-23 | Discharge: 2019-04-23 | Disposition: A | Discharge status: Home / Self Care | Payer: Medicare Other | Source: Ambulatory Visit | Attending: Radiation Oncology | Admitting: Radiation Oncology

## 2019-04-23 ENCOUNTER — Other Ambulatory Visit: Payer: Self-pay

## 2019-04-23 DIAGNOSIS — C7951 Secondary malignant neoplasm of bone: Secondary | ICD-10-CM | POA: Diagnosis not present

## 2019-04-23 DIAGNOSIS — C61 Malignant neoplasm of prostate: Secondary | ICD-10-CM | POA: Diagnosis not present

## 2019-04-23 DIAGNOSIS — Z510 Encounter for antineoplastic radiation therapy: Secondary | ICD-10-CM | POA: Diagnosis not present

## 2019-04-23 NOTE — Progress Notes (Signed)
HEMATOLOGY/ONCOLOGY CLINIC NOTE  Date of Service: 04/24/19   Patient Care Team: [name], NP as PCP - General (Internal Medicine)  CHIEF COMPLAINTS/PURPOSE OF CONSULTATION:   F/u for continue mx of metastatic prostate cancer  HISTORY OF PRESENTING ILLNESS:  Eric Osborn. is a wonderful 74 y.o. male who has been referred to [place] from the [facility] ED by [name] PA-C for evaluation and management of metastatic malignancy concerning for metastatic prostate cancer.  Patient has had a h/o locally advanced prostate cancer diagnosed in 2014 and had a radical prostatectomy and pelvic LN dissection by Dr [name] in 10/2012. Pathology showed pT3b pN0 disease (with positive margins, extraprostatic extension, seminal vesicle involvement and angiolymphatic invasion). Bone scan was negative for metastatic disease. Patient report no post-op RT. He notes that he received lupron shots as per his urologist for 1 year post-operatively. He was being monitored by Dr [name] and last had clinical evaluation and PSA about 18months ago - PSA level not available in our system. He notes that he was lost to f/u since then.  Patient presented to the ED with worsening lower and middle back pain for 2 weeks. Also notes about 15-20lbs weight loss and anorexia over the last 6-8weeks. He also notes hematuria. No fevers/chills. Patient had a CT abd/pelvis in the ED which showed Innumerable sclerotic lesions in all imaged bones consistent metastatic disease. The prostate gland has an irregular appearance and the patient may have prostate carcinoma. A few enlarged retroperitoneal lymph nodes are identified worrisome for additional foci of metastatic disease.  Patient's pain was somewhat controlled with prn percocet in ED and he was discharged home with f/u with [place]. Patient notes that the patient wakes him up in the night.  No urinary retention. No loss of bowel or bladder control. No new  neurological symptoms in his extremities.   INTERVAL HISTORY   Yun Conn. is here to follow up of his metastatic prostatic cancer and next Lupron shot.The patient's last visit with [place] was on 02/06/2019. The pt reports that he is doing well overall.  The pt reports feeling a lot better and his back pain is much improved and is almost finished with radiation.  His appetite has improved as well.  He is taking Zytiga and tolerating it well.  Of note since the patient's last visit, pt has had MR SACRUM SI [place] (Accession [id number]) completed on 04/10/2019 with results revealing "1. Epidural tumor anteriorly in the sacral spinal canal at the S2 level measuring 2.7 by 1.0 by 2.6 cm, with mass effect on the S2 nerve roots and below due to effacement of the sacral spinal canal. Minimal extension into the medial S2 neural foramina. 2. Extensive multifocal metastatic involvement in the lower lumbar spine, bony pelvis, and right proximal femur similar to that noted previously, with expansile lesions in the right iliac bone and right ischium. 3. Extraosseous extension of tumor adjacent to the right iliac bone primarily along the deep margin of the iloipsoas, but also posteriorly adjacent to the gluteal musculature. 4. Nonspecific low-grade edema in the piriformis musculature, right greater than left, possibly neurogenic."  Lab results today (04/24/19) of CBC w/diff and CMP is as follows: all values are WNL except for Glucose Bld at 101, Calcium at 8.4, Alkaline Phosphatase at 846, Hemoglobin at 12.4 PENDING PSA.  On review of systems, pt denies fevers, chills, new concerns, back pain, pain going into legs and any  other symptoms.   MEDICAL HISTORY:   Past Medical History:  Diagnosis Date  . Arthritis    left knee, left shoulder  . Erectile dysfunction 06/25/2012  . Foley catheter in place   . Goiter 06/25/2012  . H/O hiatal hernia   . Hematuria    occasional  . Hemorrhoid   .  History of bladder infections   . Joint pain   . Prostate cancer [facility])     SURGICAL HISTORY: Past Surgical History:  Procedure Laterality Date  . INGUINAL HERNIA REPAIR  8 yrs ago  . LYMPHADENECTOMY Bilateral 11/20/2012   Procedure: WITH BILATERAL PELVIC LYMPH NODE DISSECTION ;  Surgeon: [name], MD;  Location: [facility];  Service: Urology;  Laterality: Bilateral;  . ROBOT ASSISTED LAPAROSCOPIC RADICAL PROSTATECTOMY N/A 11/20/2012   Procedure: ROBOTIC ASSISTED LAPAROSCOPIC RADICAL PROSTATECTOMY;  Surgeon: [name], MD;  Location: [facility];  Service: Urology;  Laterality: N/A;    SOCIAL HISTORY: Social History   Socioeconomic History  . Marital status: Divorced    Spouse name: Not on file  . Number of children: 3  . Years of education: 9  . Highest education level: Not on file  Occupational History  . Occupation: Retired    Comment: textile  Tobacco Use  . Smoking status: Former Smoker    Packs/day: 0.25    Years: 25.00    Pack years: 6.25    Types: Cigarettes    Quit date: 03/27/2014    Years since quitting: 5.0  . Smokeless tobacco: Former Systems developer  . Tobacco comment: quit date 2016  Substance and Sexual Activity  . Alcohol use: Not Currently    Comment: occasional beer   . Drug use: Yes    Types: Marijuana    Comment: 3 x a month uses marijuana  . Sexual activity: Not Currently  Other Topics Concern  . Not on file  Social History Narrative   Regular exercise-no   Caffeine Use-yes   Social Determinants of Health   Financial Resource Strain:   . Difficulty of Paying Living Expenses: Not on file  Food Insecurity:   . Worried About Charity fundraiser in the Last Year: Not on file  . Ran Out of Food in the Last Year: Not on file  Transportation Needs:   . Lack of Transportation (Medical): Not on file  . Lack of Transportation (Non-Medical): Not on file  Physical Activity:   . Days of Exercise per Week: Not on file  . Minutes of Exercise per Session: Not on  file  Stress:   . Feeling of Stress : Not on file  Social Connections:   . Frequency of Communication with Friends and Family: Not on file  . Frequency of Social Gatherings with Friends and Family: Not on file  . Attends Religious Services: Not on file  . Active Member of Clubs or Organizations: Not on file  . Attends Archivist Meetings: Not on file  . Marital Status: Not on file  Intimate Partner Violence:   . Fear of Current or Ex-Partner: Not on file  . Emotionally Abused: Not on file  . Physically Abused: Not on file  . Sexually Abused: Not on file    FAMILY HISTORY: Family History  Problem Relation Age of Onset  . Heart disease Sister   . Stroke Sister   . Colon cancer Paternal Uncle   . Cancer Daughter        unknown  . Prostate cancer Paternal  Uncle   . Diabetes Neg Hx   . Breast cancer Neg Hx     ALLERGIES:  is allergic to heparin and poison ivy extract [poison ivy extract].  MEDICATIONS:  Current Outpatient Medications  Medication Sig Dispense Refill  . abiraterone acetate (ZYTIGA) 250 MG tablet TAKE 4 TABLETS (1,000 MG TOTAL) BY MOUTH DAILY. TAKE ON AN EMPTY STOMACH 1 HOUR BEFORE OR 2 HOURS AFTER A MEAL 120 tablet 1  . denosumab (XGEVA) 120 MG/1.7ML SOLN injection Inject 120 mg into the skin once.    [name] dexamethasone (DECADRON) 4 MG tablet Take 1 tablet (4 mg total) by mouth 2 (two) times daily with breakfast and lunch. 60 tablet 0  . fentaNYL (DURAGESIC) 50 MCG/HR Place 1 patch onto the skin every 3 (three) days. 10 patch 0  . leuprolide (LUPRON) 22.5 MG injection Inject 22.5 mg into the muscle every 3 (three) months.    . lidocaine (LIDODERM) 5 % Place 1 patch onto the skin daily. Apply to area of maximal pain over lower back. Remove & Discard patch within 12 hours or as directed by MD 30 patch 0  . mupirocin ointment (BACTROBAN) 2 % Apply 1 application topically 4 (four) times daily. 30 g 0  . oxyCODONE 10 MG TABS Take 1-2 tablets (10-20 mg total) by  mouth every 4 (four) hours as needed for severe pain (severe cancer related pain - Metastatic prostate cancer). 120 tablet 0  . predniSONE (DELTASONE) 5 MG tablet Take 1 tablet (5 mg total) by mouth daily with breakfast. 30 tablet 2  . senna-docusate (SENNA S) 8.6-50 MG tablet Take 2 tablets by mouth at bedtime. 60 tablet 1  . Vitamin D, Ergocalciferol, (DRISDOL) 1.25 MG (50000 UT) CAPS capsule Take 1 capsule (50,000 Units total) by mouth 2 (two) times a week. 24 capsule 6   Current Facility-Administered Medications  Medication Dose Route Frequency Provider Last Rate Last Admin  . morphine 4 MG/ML injection 2 mg  2 mg Intramuscular Once Bruning, Ashlyn, PA-C        REVIEW OF SYSTEMS:   A 10+ POINT REVIEW OF SYSTEMS WAS OBTAINED including neurology, dermatology, psychiatry, cardiac, respiratory, lymph, extremities, GI, GU, Musculoskeletal, constitutional, breasts, reproductive, HEENT.  All pertinent positives are noted in the HPI.  All others are negative.    PHYSICAL EXAMINATION: ECOG FS:1 - Symptomatic but completely ambulatory  Vitals:   04/24/19 0959  BP: 136/87  Pulse: 96  Resp: 16  Temp: (!) 97 F (36.1 C)  SpO2: 99%   Wt Readings from Last 3 Encounters:  04/24/19 175 lb 11.2 oz (79.7 kg)  04/11/19 181 lb 12.8 oz (82.5 kg)  04/08/19 179 lb 1.6 oz (81.2 kg)   Body mass index is 23.18 kg/m.    GENERAL:alert, in no acute distress and comfortable SKIN: no acute rashes, no significant lesions EYES: conjunctiva are pink and non-injected, sclera anicteric OROPHARYNX: MMM, no exudates, no oropharyngeal erythema or ulceration NECK: supple, no JVD LYMPH:  no palpable lymphadenopathy in the cervical, axillary or inguinal regions LUNGS: clear to auscultation b/l with normal respiratory effort HEART: regular rate & rhythm ABDOMEN:  normoactive bowel sounds , non tender, not distended. Extremity: no pedal edema PSYCH: alert & oriented x 3 with fluent speech NEURO: no focal  motor/sensory deficits  NEURO: no focal motor/sensory deficits, mild left scapular tenderness, L 2-3 lumbar pain TTP , sacral tenderness to palpation.  LABORATORY DATA:  I have reviewed the data as listed  . CBC Latest Ref  Rng & Units 04/24/2019 04/08/2019 03/14/2019  WBC 4.0 - 10.5 K/uL 5.7 10.0 9.4  Hemoglobin 13.0 - 17.0 g/dL 12.4(L) 12.9(L) 12.7(L)  Hematocrit 39.0 - 52.0 % 39.9 41.2 41.0  Platelets 150 - 400 K/uL 313 315 295    CMP Latest Ref Rng & Units 04/24/2019 04/08/2019 03/14/2019  Glucose 70 - 99 mg/dL 101(H) 117(H) 98  BUN 8 - 23 mg/dL 11 12 9   Creatinine 0.61 - 1.24 mg/dL 0.63 0.67 0.62  Sodium 135 - 145 mmol/L 138 140 140  Potassium 3.5 - 5.1 mmol/L 3.9 4.2 3.7  Chloride 98 - 111 mmol/L 101 105 105  CO2 22 - 32 mmol/L 28 26 26   Calcium 8.9 - 10.3 mg/dL 8.4(L) 8.6(L) 8.4(L)  Total Protein 6.5 - 8.1 g/dL 6.9 7.1 7.1  Total Bilirubin 0.3 - 1.2 mg/dL 0.4 0.4 0.5  Alkaline Phos 38 - 126 U/L 846(H) 1,102(H) 1,060(H)  AST 15 - 41 U/L 34 41 38  ALT 0 - 44 U/L 9 6 6    .     08/14/2017 Peripheral Blood Flow Cytometry           RADIOGRAPHIC STUDIES: I have personally reviewed the radiological images as listed and agreed with the findings in the report. MR Lumbar Spine W Wo Contrast  Result Date: 04/10/2019 CLINICAL DATA:  Metastatic prostate cancer with severe new low back pain EXAM: MRI LUMBAR SPINE WITHOUT AND WITH CONTRAST TECHNIQUE: Multiplanar and multiecho pulse sequences of the lumbar spine were obtained without and with intravenous contrast. CONTRAST:  30mL GADAVIST GADOBUTROL 1 MMOL/ML IV SOLN COMPARISON:  None. FINDINGS: Segmentation:  Standard. Alignment:  No significant listhesis. Vertebrae: There is multifocal abnormal T1 and T2 signal with patchy STIR hyperintensity consistent with sclerotic osseous metastatic disease. There is no compression deformity. Circumferential epidural disease, greatest ventrally at the L3 level. This results in severe canal  stenosis. There is also epidural disease ventrally at the sacrum extending from approximately S1-S2 to S2-S3. Partially imaged involvement of bilateral S2 foramina. Conus medullaris and cauda equina: Conus extends to the L1-L2 level. No abnormal intrathecal enhancement. Paraspinal and other soft tissues: Partially imaged metastatic disease of the iliac bones with prominent extraosseous extension on the right. Disc levels: L1-L2:  No canal or foraminal stenosis. L2-L3:  Disc bulge.  No canal or foraminal stenosis. L3-L4: Disc bulge and mild facet arthropathy. Moderate to marked degenerative canal stenosis. Extraosseous extension of disease described above involves bilateral neural foramina. L4-L5: Disc bulge eccentric to the right with endplate osteophytic ridging and mild facet arthropathy. No canal stenosis. Mild to moderate right foraminal stenosis. No left foraminal stenosis L5-S1: Disc bulge with endplate osteophytic ridging and mild facet arthropathy. No canal stenosis. Mild right and moderate left foraminal stenosis. IMPRESSION: Diffuse sclerotic metastatic disease.  No compression deformity. Circumferential epidural disease at the L3 level results in severe canal stenosis. There is also extension into the L3-L4 neural foramina. Sacral epidural disease with involvement of S2 foramina. Multilevel degenerative changes as detailed above. These results will be called to the ordering clinician or representative by the Radiologist Assistant, and communication documented in the PACS or zVision Dashboard. Electronically Signed   By: Macy Mis M.D.   On: 04/10/2019 18:32   MR SACRUM SI JOINTS W WO CONTRAST  Result Date: 04/11/2019 CLINICAL DATA:  Metastatic prostate cancer, low back pain and point tenderness. EXAM: MRI SACRUM WITHOUT CONTRAST TECHNIQUE: Multiplanar multisequence MR imaging of the pelvis was performed. No intravenous contrast was administered. COMPARISON:  PET-CT from 01/30/2019 and MRI lumbar  spine from 04/10/2019 FINDINGS: Osseous structures: Extensive multifocal metastatic involvement in the lower lumbar spine, bony pelvis, and right proximal femur with low signal intensity lesions predominating. Expansile lesions of the right iliac bone extending down into the right ischium as noted on prior PET-CT, with the resulting width of the right iliac bone at about 5.8 cm as compared to the non expanded 2.2 cm of the left side. There is considerable extraosseous extension of tumor adjacent to the right iliac bone, with soft tissue enhancing tumor deep to the iliacus muscle measuring up to 2.8 cm in thickness on image 7/10, as well as a small rind of tumor posterior to the right iliac bone up to 0.9 cm thickness. The expansion of the iliac bone and right acetabulum causes medial convexity of the acetabulum along the margin of the obturator internus muscle. Lesions in the right proximal femur have a similar configuration to the prior PET-CT of 01/30/2019. Sacral spinal canal: Epidural tumor completely effaces the sacral spinal canal primarily at the S2 level as shown on image 14/12, with tumor in this vicinity measuring 2.7 cm along the axis of the canal, 1.0 cm in thickness, and 2.6 cm transversely. This tumor extends partially into the medial aspect of the S2 neural foramina with likely impingement on the S2 spinal nerves, and the resulting effacement of the sacral component of the thecal sac is likely impinging on the lower sacral nerve roots. Musculotendinous: There is mass effect from the extraosseous spread of tumor from the right iliac bone along the iloipsoas. Generalized moderate regional muscular atrophy in the pelvis. Low-level edema signal in the piriformis musculature (right greater than left) and along the portions of the right gluteal musculature close to the iliac bone. Other: Prostatectomy. IMPRESSION: 1. Epidural tumor anteriorly in the sacral spinal canal at the S2 level measuring 2.7 by 1.0  by 2.6 cm, with mass effect on the S2 nerve roots and below due to effacement of the sacral spinal canal. Minimal extension into the medial S2 neural foramina. 2. Extensive multifocal metastatic involvement in the lower lumbar spine, bony pelvis, and right proximal femur similar to that noted previously, with expansile lesions in the right iliac bone and right ischium. 3. Extraosseous extension of tumor adjacent to the right iliac bone primarily along the deep margin of the iloipsoas, but also posteriorly adjacent to the gluteal musculature. 4. Nonspecific low-grade edema in the piriformis musculature, right greater than left, possibly neurogenic. Electronically Signed   By: Van Clines M.D.   On: 04/11/2019 08:09    ASSESSMENT & PLAN:   74 y.o. AAM with previous h/o of locally advanced pT3b,pN0 prostate cancer now with concern for newly noted metastatic prostate cancer.  1) Metastatic Prostate Cancer  PSA level 550 pre-treatment and have improved significantly and were down to 0.6. Now gradually rising again and if upt o  49.7  without any other associated new symptoms. Patient had been noted to have extensive osseous metastatic disease throughout the spine and bilaterally in the calvarium as well. Also noted to have local recurrence in the prostatic bed, retroperitoneal Lnadenoapathy and some mediastinal LNadenoapathy.  ECOG PS 1 Previously locally advanced pT3b,pN0 diagnosed in 10/2012 s/p radical prostatectomy and ADT x 1 yr. Testosterone level 7 -- is dropping appropriately with treatment. S/p 6 cycles of taxotere.  12/25/17 PET/CT revealed Solitary retroperitoneal lymph node with radiotracer activity adjacent the IVC most consistent with prostate cancer metastasis. 2. Distant nodal metastasis  in the LEFT and RIGHT axilla. Not typical location for prostate cancer metastasis but the activity is intense and favor such. 3. New periosteal reaction within the RIGHT iliac bone and LEFT scapula  with intense radiotracer activity consistent active skeletal metastasis. Additional active metastasis in the RIGHT mandible and proximal RIGHT femur.   2) Anterior mandibular pain  Orthopantogram done - showed IMPRESSION: Residual teeth 23-26 with decay.  No mandibular osseous lesion seen Patient has completed dental extractions and is using dentures and  3) Elevated alkaline phosphatase level due to extensive bone mets.   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 Stable. -would anticipate mild anemia from ADT  5) Hematuria due to local recurrence of prostate cancer - 1 episode of hematuria - now resolved  6) Anorexia resolved.. Notes improved po intake with progressive weight gain. Patient notes he's been eating much better. . Wt Readings from Last 3 Encounters:  04/11/19 181 lb 12.8 oz (82.5 kg)  04/08/19 179 lb 1.6 oz (81.2 kg)  03/14/19 185 lb 9.6 oz (84.2 kg)    7) Vit D deficiency with hypocalcemia -- levels are normalizing. -Being aggressively replaced since his calcium levels have dropped after Xgeva likely reflecting significant bone calcium deficits from extensive healing bone lesions. -Continue ergocalciferol 50,000U weekly  -Continue Vit D daily    8) Neoplasm related pain - much improved patient is has not needed much of his prescribed pain medications Plan  -Continue Percocet when necessary for neoplasm related bone pains, refilled today  -Senna S for bowel prophylaxis  9) Colonic thickening in transverse colon thought to be related to spasm/incomplete distension of colon in this area. -would recommend pursuing colonoscopy with PCP --pending  10) Left shoulder pain Patient has left shoulder pain s/p injury 3-4 months ago.  -XR left shoulder today- reviewed results - No fracture or dislocation is noted in the left shoulder. Sclerosis of scapula is noted consistent with metastatic disease. 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 -Will refill Fentanyl and Drisdol - Will reduce dexamethasone to 4mg  once a day once radiation has been completed -last session of RT today. No injection today. -Will switch from Xgeva to Zometa at next appointment.  FOLLOW UP: F/u as per currently scheduled appointments  The total time spent in the appt was 20 minutes and more than 50% was on counseling and direct patient cares.  All of the patient's questions were answered with apparent satisfaction. The patient knows to call the clinic with any problems, questions or concerns.   Sullivan Lone MD MS AAHIVMS [facility] Hematology/Oncology Physician [facility]  (Office):       [phone] (Work cell):  [phone] (Fax):           [phone]  I, Scot Dock, am acting as a scribe for Dr. Sullivan Lone.   .I have reviewed the above documentation for accuracy and completeness, and I agree with the above. [name] MD

## 2019-04-24 ENCOUNTER — Ambulatory Visit
Admission: RE | Admit: 2019-04-24 | Discharge: 2019-04-24 | Disposition: A | Discharge status: Home / Self Care | Payer: Medicare Other | Source: Ambulatory Visit | Attending: Radiation Oncology | Admitting: Radiation Oncology

## 2019-04-24 ENCOUNTER — Inpatient Hospital Stay (HOSPITAL_BASED_OUTPATIENT_CLINIC_OR_DEPARTMENT_OTHER): Payer: Medicare Other | Admitting: Hematology

## 2019-04-24 ENCOUNTER — Inpatient Hospital Stay: Payer: Medicare Other

## 2019-04-24 ENCOUNTER — Other Ambulatory Visit: Payer: Self-pay

## 2019-04-24 ENCOUNTER — Telehealth: Payer: Self-pay | Admitting: Hematology

## 2019-04-24 VITALS — BP 136/87 | HR 96 | Temp 97.0°F | Resp 16 | Ht 73.0 in | Wt 175.7 lb

## 2019-04-24 DIAGNOSIS — Z5111 Encounter for antineoplastic chemotherapy: Secondary | ICD-10-CM | POA: Diagnosis not present

## 2019-04-24 DIAGNOSIS — G893 Neoplasm related pain (acute) (chronic): Secondary | ICD-10-CM | POA: Diagnosis not present

## 2019-04-24 DIAGNOSIS — C7951 Secondary malignant neoplasm of bone: Secondary | ICD-10-CM | POA: Diagnosis not present

## 2019-04-24 DIAGNOSIS — C61 Malignant neoplasm of prostate: Secondary | ICD-10-CM | POA: Diagnosis not present

## 2019-04-24 DIAGNOSIS — Z510 Encounter for antineoplastic radiation therapy: Secondary | ICD-10-CM | POA: Diagnosis not present

## 2019-04-24 DIAGNOSIS — M25512 Pain in left shoulder: Secondary | ICD-10-CM | POA: Diagnosis not present

## 2019-04-24 LAB — CBC WITH DIFFERENTIAL/PLATELET
Abs Immature Granulocytes: 0.03 10*3/uL (ref 0.00–0.07)
Basophils Absolute: 0 10*3/uL (ref 0.0–0.1)
Basophils Relative: 0 %
Eosinophils Absolute: 0.3 10*3/uL (ref 0.0–0.5)
Eosinophils Relative: 5 %
HCT: 39.9 % (ref 39.0–52.0)
Hemoglobin: 12.4 g/dL — ABNORMAL LOW (ref 13.0–17.0)
Immature Granulocytes: 1 %
Lymphocytes Relative: 39 %
Lymphs Abs: 2.3 10*3/uL (ref 0.7–4.0)
MCH: 27.8 pg (ref 26.0–34.0)
MCHC: 31.1 g/dL (ref 30.0–36.0)
MCV: 89.5 fL (ref 80.0–100.0)
Monocytes Absolute: 0.3 10*3/uL (ref 0.1–1.0)
Monocytes Relative: 5 %
Neutro Abs: 2.9 10*3/uL (ref 1.7–7.7)
Neutrophils Relative %: 50 %
Platelets: 313 10*3/uL (ref 150–400)
RBC: 4.46 MIL/uL (ref 4.22–5.81)
RDW: 14.9 % (ref 11.5–15.5)
WBC: 5.7 10*3/uL (ref 4.0–10.5)
nRBC: 0 % (ref 0.0–0.2)

## 2019-04-24 LAB — CMP (CANCER CENTER ONLY)
ALT: 9 U/L (ref 0–44)
AST: 34 U/L (ref 15–41)
Albumin: 3.8 g/dL (ref 3.5–5.0)
Alkaline Phosphatase: 846 U/L — ABNORMAL HIGH (ref 38–126)
Anion gap: 9 (ref 5–15)
BUN: 11 mg/dL (ref 8–23)
CO2: 28 mmol/L (ref 22–32)
Calcium: 8.4 mg/dL — ABNORMAL LOW (ref 8.9–10.3)
Chloride: 101 mmol/L (ref 98–111)
Creatinine: 0.63 mg/dL (ref 0.61–1.24)
GFR, Est AFR Am: >60 mL/min (ref >60)
GFR, Estimated: >60 mL/min (ref >60)
Glucose, Bld: 101 mg/dL — ABNORMAL HIGH (ref 70–99)
Potassium: 3.9 mmol/L (ref 3.5–5.1)
Sodium: 138 mmol/L (ref 135–145)
Total Bilirubin: 0.4 mg/dL (ref 0.3–1.2)
Total Protein: 6.9 g/dL (ref 6.5–8.1)

## 2019-04-24 MED ORDER — FENTANYL 50 MCG/HR TD PT72: 1.0000 | MEDICATED_PATCH | TRANSDERMAL | 0 refills | Status: DC

## 2019-04-24 MED ORDER — VITAMIN D (ERGOCALCIFEROL) 1.25 MG (50000 UNIT) PO CAPS: 50000.0000 [IU] | ORAL_CAPSULE | ORAL | 6 refills | Status: DC

## 2019-04-24 NOTE — Telephone Encounter (Signed)
No new los during check out. 

## 2019-04-25 ENCOUNTER — Other Ambulatory Visit: Payer: Self-pay

## 2019-04-25 ENCOUNTER — Ambulatory Visit
Admission: RE | Admit: 2019-04-25 | Discharge: 2019-04-25 | Disposition: A | Discharge status: Home / Self Care | Payer: Medicare Other | Source: Ambulatory Visit | Attending: Radiation Oncology | Admitting: Radiation Oncology

## 2019-04-25 ENCOUNTER — Ambulatory Visit: Payer: Medicare Other

## 2019-04-25 DIAGNOSIS — C7951 Secondary malignant neoplasm of bone: Secondary | ICD-10-CM | POA: Diagnosis not present

## 2019-04-25 DIAGNOSIS — Z510 Encounter for antineoplastic radiation therapy: Secondary | ICD-10-CM | POA: Diagnosis not present

## 2019-04-25 DIAGNOSIS — C61 Malignant neoplasm of prostate: Secondary | ICD-10-CM | POA: Diagnosis not present

## 2019-04-25 LAB — PSA, TOTAL AND FREE
PSA, Free Pct: 7.8 %
PSA, Free: 16.4 ng/mL
Prostate Specific Ag, Serum: 209 ng/mL — ABNORMAL HIGH (ref 0.0–4.0)

## 2019-04-28 ENCOUNTER — Ambulatory Visit
Admission: RE | Admit: 2019-04-28 | Discharge: 2019-04-28 | Disposition: A | Discharge status: Home / Self Care | Payer: Medicare Other | Source: Ambulatory Visit | Attending: Radiation Oncology | Admitting: Radiation Oncology

## 2019-04-28 ENCOUNTER — Encounter: Payer: Self-pay | Admitting: Radiation Oncology

## 2019-04-28 ENCOUNTER — Other Ambulatory Visit: Payer: Self-pay

## 2019-04-28 DIAGNOSIS — C7951 Secondary malignant neoplasm of bone: Secondary | ICD-10-CM | POA: Insufficient documentation

## 2019-04-28 DIAGNOSIS — C61 Malignant neoplasm of prostate: Secondary | ICD-10-CM | POA: Insufficient documentation

## 2019-04-28 DIAGNOSIS — Z510 Encounter for antineoplastic radiation therapy: Secondary | ICD-10-CM | POA: Diagnosis not present

## 2019-04-29 NOTE — Progress Notes (Signed)
  Radiation Oncology         [id number]) [phone] ________________________________  Name: Eric Osborn. MRN: [id number]  Date: 04/28/2019  DOB: 1946/03/20  End of Treatment Note  Diagnosis:   74 y.o. 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   His pain improved.  Plan: The patient has completed radiation treatment. The patient will return to radiation oncology clinic for routine followup in one month. I advised him to call or return sooner if he has any questions or concerns related to his recovery or treatment. ________________________________  Sheral Apley. Tammi Klippel, M.D.

## 2019-05-01 ENCOUNTER — Other Ambulatory Visit: Payer: Self-pay | Admitting: Hematology

## 2019-05-01 DIAGNOSIS — C61 Malignant neoplasm of prostate: Secondary | ICD-10-CM

## 2019-05-01 NOTE — Telephone Encounter (Signed)
Your note on 1/28 states you  plan to reduce dose

## 2019-05-06 ENCOUNTER — Inpatient Hospital Stay (HOSPITAL_BASED_OUTPATIENT_CLINIC_OR_DEPARTMENT_OTHER): Payer: Medicare Other | Admitting: Hematology

## 2019-05-06 ENCOUNTER — Inpatient Hospital Stay: Payer: Medicare Other

## 2019-05-06 ENCOUNTER — Other Ambulatory Visit: Payer: Self-pay

## 2019-05-06 ENCOUNTER — Ambulatory Visit: Payer: Medicare Other

## 2019-05-06 ENCOUNTER — Inpatient Hospital Stay: Payer: Medicare Other | Attending: Hematology

## 2019-05-06 VITALS — BP 132/90 | HR 88 | Temp 98.0°F | Resp 18 | Ht 73.0 in | Wt 174.4 lb

## 2019-05-06 DIAGNOSIS — G893 Neoplasm related pain (acute) (chronic): Secondary | ICD-10-CM

## 2019-05-06 DIAGNOSIS — C61 Malignant neoplasm of prostate: Secondary | ICD-10-CM

## 2019-05-06 DIAGNOSIS — C7951 Secondary malignant neoplasm of bone: Secondary | ICD-10-CM

## 2019-05-06 DIAGNOSIS — Z7189 Other specified counseling: Secondary | ICD-10-CM

## 2019-05-06 LAB — CBC WITH DIFFERENTIAL/PLATELET
Abs Immature Granulocytes: 0.02 10*3/uL (ref 0.00–0.07)
Basophils Absolute: 0 10*3/uL (ref 0.0–0.1)
Basophils Relative: 1 %
Eosinophils Absolute: 0.2 10*3/uL (ref 0.0–0.5)
Eosinophils Relative: 6 %
HCT: 37.3 % — ABNORMAL LOW (ref 39.0–52.0)
Hemoglobin: 11.7 g/dL — ABNORMAL LOW (ref 13.0–17.0)
Immature Granulocytes: 1 %
Lymphocytes Relative: 26 %
Lymphs Abs: 1 10*3/uL (ref 0.7–4.0)
MCH: 27.7 pg (ref 26.0–34.0)
MCHC: 31.4 g/dL (ref 30.0–36.0)
MCV: 88.2 fL (ref 80.0–100.0)
Monocytes Absolute: 0.3 10*3/uL (ref 0.1–1.0)
Monocytes Relative: 7 %
Neutro Abs: 2.3 10*3/uL (ref 1.7–7.7)
Neutrophils Relative %: 59 %
Platelets: 245 10*3/uL (ref 150–400)
RBC: 4.23 MIL/uL (ref 4.22–5.81)
RDW: 15.6 % — ABNORMAL HIGH (ref 11.5–15.5)
WBC: 3.9 10*3/uL — ABNORMAL LOW (ref 4.0–10.5)
nRBC: 0 % (ref 0.0–0.2)

## 2019-05-06 LAB — CMP (CANCER CENTER ONLY)
ALT: 8 U/L (ref 0–44)
AST: 24 U/L (ref 15–41)
Albumin: 3.3 g/dL — ABNORMAL LOW (ref 3.5–5.0)
Alkaline Phosphatase: 694 U/L — ABNORMAL HIGH (ref 38–126)
Anion gap: 5 (ref 5–15)
BUN: 10 mg/dL (ref 8–23)
CO2: 26 mmol/L (ref 22–32)
Calcium: 8.2 mg/dL — ABNORMAL LOW (ref 8.9–10.3)
Chloride: 109 mmol/L (ref 98–111)
Creatinine: 0.59 mg/dL — ABNORMAL LOW (ref 0.61–1.24)
GFR, Est AFR Am: >60 mL/min (ref >60)
GFR, Estimated: >60 mL/min (ref >60)
Glucose, Bld: 105 mg/dL — ABNORMAL HIGH (ref 70–99)
Potassium: 4.1 mmol/L (ref 3.5–5.1)
Sodium: 140 mmol/L (ref 135–145)
Total Bilirubin: 0.3 mg/dL (ref 0.3–1.2)
Total Protein: 6.2 g/dL — ABNORMAL LOW (ref 6.5–8.1)

## 2019-05-06 MED ORDER — SODIUM CHLORIDE 0.9 % IV SOLN
INTRAVENOUS | Status: DC
  Filled 2019-05-06: qty 250

## 2019-05-06 MED ORDER — ZOLEDRONIC ACID 4 MG/100ML IV SOLN
4.0000 mg | Freq: Once | INTRAVENOUS | Status: AC
  Administered 2019-05-06: 4 mg via INTRAVENOUS

## 2019-05-06 MED ORDER — ZOLEDRONIC ACID 4 MG/100ML IV SOLN
INTRAVENOUS | Status: AC
  Filled 2019-05-06: qty 100

## 2019-05-06 MED ORDER — OXYCODONE HCL 10 MG PO TABS: 10.0000 mg | ORAL_TABLET | ORAL | 0 refills | Status: DC | PRN

## 2019-05-06 MED ORDER — FENTANYL 50 MCG/HR TD PT72: 1.0000 | MEDICATED_PATCH | TRANSDERMAL | 0 refills | Status: DC

## 2019-05-06 MED ORDER — VITAMIN D (ERGOCALCIFEROL) 1.25 MG (50000 UNIT) PO CAPS: 50000.0000 [IU] | ORAL_CAPSULE | ORAL | 6 refills | Status: AC

## 2019-05-06 NOTE — Progress Notes (Signed)
HEMATOLOGY/ONCOLOGY CLINIC NOTE  Date of Service: 05/06/19   Patient Care Team: [name], NP as PCP - General (Internal Medicine)  CHIEF COMPLAINTS/PURPOSE OF CONSULTATION:   F/u for continue mx of metastatic prostate cancer  HISTORY OF PRESENTING ILLNESS:  Eric Osborn. is a wonderful 74 y.o. male who has been referred to [place] from the [facility] long hospital ED by [name] PA-C for evaluation and management of metastatic malignancy concerning for metastatic prostate cancer.  Patient has had a h/o locally advanced prostate cancer diagnosed in 2014 and had a radical prostatectomy and pelvic LN dissection by Dr [name] in 10/2012. Pathology showed pT3b pN0 disease (with positive margins, extraprostatic extension, seminal vesicle involvement and angiolymphatic invasion). Bone scan was negative for metastatic disease. Patient report no post-op RT. He notes that he received lupron shots as per his urologist for 1 year post-operatively. He was being monitored by Dr [name] and last had clinical evaluation and PSA about 72months ago - PSA level not available in our system. He notes that he was lost to f/u since then.  Patient presented to the ED with worsening lower and middle back pain for 2 weeks. Also notes about 15-20lbs weight loss and anorexia over the last 6-8weeks. He also notes hematuria. No fevers/chills. Patient had a CT abd/pelvis in the ED which showed Innumerable sclerotic lesions in all imaged bones consistent metastatic disease. The prostate gland has an irregular appearance and the patient may have prostate carcinoma. A few enlarged retroperitoneal lymph nodes are identified worrisome for additional foci of metastatic disease.  Patient's pain was somewhat controlled with prn percocet in ED and he was discharged home with f/u with [place]. Patient notes that the patient wakes him up in the night.  No urinary retention. No loss of bowel or bladder control. No new  neurological symptoms in his extremities.   INTERVAL HISTORY   Eric Osborn. is here to follow up of his metastatic prostatic cancer and next Lupron shot. The patient's last visit with [place] was on 04/24/2019. The pt reports that he is doing well overall.  The pt reports that he has some upper leg muscle cramping when he makes rapid movements while in the bed or when he gets out of the bed quickly. There are some spots in his leg that are sore to the touch intermittently. He has continued to take Zytiga and Vitamin D as prescribed and Dexamethasone once per day.   His back pain has improved about 75% after his radiation and he denies any bothersome skin changes. He has been wearing a back brace to help with his pain.   Lab results today (05/06/19) of CBC w/diff and CMP is as follows: all values are WNL except for WBC at 3.9K, Hgb at 11.7, HCT at 37.3, RDW at 15.6, Glucose at 105, Creatinine at 0.59, Calcium at 8.2, Total Protein at 6.2, Albumin at 3.3, Alkaline Phosphatase at 694. 05/06/2019 PSA is   On review of systems, pt reports upper leg cramping, improving back pain and denies diarrhea, urinary incontinence, skin rash/discoloration, abdominal pain, constipation, chest pain, thigh pain and any other symptoms.   MEDICAL HISTORY:   Past Medical History:  Diagnosis Date  . Arthritis    left knee, left shoulder  . Erectile dysfunction 06/25/2012  . Foley catheter in place   . Goiter 06/25/2012  . H/O hiatal hernia   . Hematuria    occasional  . Hemorrhoid   .  History of bladder infections   . Joint pain   . Prostate cancer [facility])     SURGICAL HISTORY: Past Surgical History:  Procedure Laterality Date  . INGUINAL HERNIA REPAIR  8 yrs ago  . LYMPHADENECTOMY Bilateral 11/20/2012   Procedure: WITH BILATERAL PELVIC LYMPH NODE DISSECTION ;  Surgeon: [name], MD;  Location: [facility] ORS;  Service: Urology;  Laterality: Bilateral;  . ROBOT ASSISTED LAPAROSCOPIC RADICAL PROSTATECTOMY N/A  11/20/2012   Procedure: ROBOTIC ASSISTED LAPAROSCOPIC RADICAL PROSTATECTOMY;  Surgeon: [name], MD;  Location: [facility] OR[facility];  Service: Urology;  Laterality: N/A;    SOCIAL HISTORY: Social History   Socioeconomic History  . Marital status: Divorced    Spouse name: Not on file  . Number of children: 3  . Years of education: 9  . Highest education level: Not on file  Occupational History  . Occupation: Retired    Comment: textile  Tobacco Use  . Smoking status: Former Smoker    Packs/day: 0.25    Years: 25.00    Pack years: 6.25    Types: Cigarettes    Quit date: 03/27/2014    Years since quitting: 5.1  . Smokeless tobacco: Former Systems developer  . Tobacco comment: quit date 2016  Substance and Sexual Activity  . Alcohol use: Not Currently    Comment: occasional beer   . Drug use: Yes    Types: Marijuana    Comment: 3 x a month uses marijuana  . Sexual activity: Not Currently  Other Topics Concern  . Not on file  Social History Narrative   Regular exercise-no   Caffeine Use-yes   Social Determinants of Health   Financial Resource Strain:   . Difficulty of Paying Living Expenses: Not on file  Food Insecurity:   . Worried About Charity fundraiser in the Last Year: Not on file  . Ran Out of Food in the Last Year: Not on file  Transportation Needs:   . Lack of Transportation (Medical): Not on file  . Lack of Transportation (Non-Medical): Not on file  Physical Activity:   . Days of Exercise per Week: Not on file  . Minutes of Exercise per Session: Not on file  Stress:   . Feeling of Stress : Not on file  Social Connections:   . Frequency of Communication with Friends and Family: Not on file  . Frequency of Social Gatherings with Friends and Family: Not on file  . Attends Religious Services: Not on file  . Active Member of Clubs or Organizations: Not on file  . Attends Archivist Meetings: Not on file  . Marital Status: Not on file  Intimate Partner Violence:   .  Fear of Current or Ex-Partner: Not on file  . Emotionally Abused: Not on file  . Physically Abused: Not on file  . Sexually Abused: Not on file    FAMILY HISTORY: Family History  Problem Relation Age of Onset  . Heart disease Sister   . Stroke Sister   . Colon cancer Paternal Uncle   . Cancer Daughter        unknown  . Prostate cancer Paternal Uncle   . Diabetes Neg Hx   . Breast cancer Neg Hx     ALLERGIES:  is allergic to heparin and poison ivy extract [poison ivy extract].  MEDICATIONS:  Current Outpatient Medications  Medication Sig Dispense Refill  . abiraterone acetate (ZYTIGA) 250 MG tablet TAKE 4 TABLETS (1,000 MG TOTAL) BY MOUTH DAILY.  TAKE ON AN EMPTY STOMACH 1 HOUR BEFORE OR 2 HOURS AFTER A MEAL 120 tablet 1  . denosumab (XGEVA) 120 MG/1.7ML SOLN injection Inject 120 mg into the skin once.    [name] dexamethasone (DECADRON) 4 MG tablet Take 1 tablet (4 mg total) by mouth 2 (two) times daily with breakfast and lunch. 60 tablet 0  . fentaNYL (DURAGESIC) 50 MCG/HR Place 1 patch onto the skin every 3 (three) days. 10 patch 0  . leuprolide (LUPRON) 22.5 MG injection Inject 22.5 mg into the muscle every 3 (three) months.    . lidocaine (LIDODERM) 5 % Place 1 patch onto the skin daily. Apply to area of maximal pain over lower back. Remove & Discard patch within 12 hours or as directed by MD 30 patch 0  . mupirocin ointment (BACTROBAN) 2 % Apply 1 application topically 4 (four) times daily. 30 g 0  . Oxycodone HCl 10 MG TABS Take 1-2 tablets (10-20 mg total) by mouth every 4 (four) hours as needed (severe cancer related pain - Metastatic prostate cancer). 120 tablet 0  . predniSONE (DELTASONE) 5 MG tablet TAKE 1 TABLET (5 MG TOTAL) BY MOUTH DAILY WITH BREAKFAST. 30 tablet 2  . senna-docusate (SENNA S) 8.6-50 MG tablet Take 2 tablets by mouth at bedtime. 60 tablet 1  . [START ON 05/08/2019] Vitamin D, Ergocalciferol, (DRISDOL) 1.25 MG (50000 UNIT) CAPS capsule Take 1 capsule (50,000  Units total) by mouth 2 (two) times a week. 24 capsule 6   Current Facility-Administered Medications  Medication Dose Route Frequency Provider Last Rate Last Admin  . morphine 4 MG/ML injection 2 mg  2 mg Intramuscular Once Bruning, Ashlyn, PA-C       Facility-Administered Medications Ordered in Other Visits  Medication Dose Route Frequency Provider Last Rate Last Admin  . 0.9 %  sodium chloride infusion   Intravenous Continuous [name], MD   Stopped at 05/06/19 1327    REVIEW OF SYSTEMS:   A 10+ POINT REVIEW OF SYSTEMS WAS OBTAINED including neurology, dermatology, psychiatry, cardiac, respiratory, lymph, extremities, GI, GU, Musculoskeletal, constitutional, breasts, reproductive, HEENT.  All pertinent positives are noted in the HPI.  All others are negative.   PHYSICAL EXAMINATION: ECOG FS:1 - Symptomatic but completely ambulatory  Vitals:   05/06/19 1155  BP: 132/90  Pulse: 88  Resp: 18  Temp: 98 F (36.7 C)  SpO2: 99%   Wt Readings from Last 3 Encounters:  05/06/19 174 lb 6.4 oz (79.1 kg)  04/24/19 175 lb 11.2 oz (79.7 kg)  04/11/19 181 lb 12.8 oz (82.5 kg)   Body mass index is 23.01 kg/m.    GENERAL:alert, in no acute distress and comfortable SKIN: no acute rashes, no significant lesions EYES: conjunctiva are pink and non-injected, sclera anicteric OROPHARYNX: MMM, no exudates, no oropharyngeal erythema or ulceration NECK: supple, no JVD LYMPH:  no palpable lymphadenopathy in the cervical, axillary or inguinal regions LUNGS: clear to auscultation b/l with normal respiratory effort HEART: regular rate & rhythm ABDOMEN:  normoactive bowel sounds , non tender, not distended. No palpable hepatosplenomegaly.  Extremity: no pedal edema PSYCH: alert & oriented x 3 with fluent speech NEURO: no focal motor/sensory deficits   LABORATORY DATA:  I have reviewed the data as listed  . CBC Latest Ref Rng & Units 05/06/2019 04/24/2019 04/08/2019  WBC 4.0 - 10.5 K/uL  3.9(L) 5.7 10.0  Hemoglobin 13.0 - 17.0 g/dL 11.7(L) 12.4(L) 12.9(L)  Hematocrit 39.0 - 52.0 % 37.3(L) 39.9 41.2  Platelets  150 - 400 K/uL 245 313 315    CMP Latest Ref Rng & Units 05/06/2019 04/24/2019 04/08/2019  Glucose 70 - 99 mg/dL 105(H) 101(H) 117(H)  BUN 8 - 23 mg/dL 10 11 12   Creatinine 0.61 - 1.24 mg/dL 0.59(L) 0.63 0.67  Sodium 135 - 145 mmol/L 140 138 140  Potassium 3.5 - 5.1 mmol/L 4.1 3.9 4.2  Chloride 98 - 111 mmol/L 109 101 105  CO2 22 - 32 mmol/L 26 28 26   Calcium 8.9 - 10.3 mg/dL 8.2(L) 8.4(L) 8.6(L)  Total Protein 6.5 - 8.1 g/dL 6.2(L) 6.9 7.1  Total Bilirubin 0.3 - 1.2 mg/dL 0.3 0.4 0.4  Alkaline Phos 38 - 126 U/L 694(H) 846(H) 1,102(H)  AST 15 - 41 U/L 24 34 41  ALT 0 - 44 U/L 8 9 6    .     08/14/2017 Peripheral Blood Flow Cytometry           RADIOGRAPHIC STUDIES: I have personally reviewed the radiological images as listed and agreed with the findings in the report. MR Lumbar Spine W Wo Contrast  Result Date: 04/10/2019 CLINICAL DATA:  Metastatic prostate cancer with severe new low back pain EXAM: MRI LUMBAR SPINE WITHOUT AND WITH CONTRAST TECHNIQUE: Multiplanar and multiecho pulse sequences of the lumbar spine were obtained without and with intravenous contrast. CONTRAST:  75mL GADAVIST GADOBUTROL 1 MMOL/ML IV SOLN COMPARISON:  None. FINDINGS: Segmentation:  Standard. Alignment:  No significant listhesis. Vertebrae: There is multifocal abnormal T1 and T2 signal with patchy STIR hyperintensity consistent with sclerotic osseous metastatic disease. There is no compression deformity. Circumferential epidural disease, greatest ventrally at the L3 level. This results in severe canal stenosis. There is also epidural disease ventrally at the sacrum extending from approximately S1-S2 to S2-S3. Partially imaged involvement of bilateral S2 foramina. Conus medullaris and cauda equina: Conus extends to the L1-L2 level. No abnormal intrathecal enhancement. Paraspinal and  other soft tissues: Partially imaged metastatic disease of the iliac bones with prominent extraosseous extension on the right. Disc levels: L1-L2:  No canal or foraminal stenosis. L2-L3:  Disc bulge.  No canal or foraminal stenosis. L3-L4: Disc bulge and mild facet arthropathy. Moderate to marked degenerative canal stenosis. Extraosseous extension of disease described above involves bilateral neural foramina. L4-L5: Disc bulge eccentric to the right with endplate osteophytic ridging and mild facet arthropathy. No canal stenosis. Mild to moderate right foraminal stenosis. No left foraminal stenosis L5-S1: Disc bulge with endplate osteophytic ridging and mild facet arthropathy. No canal stenosis. Mild right and moderate left foraminal stenosis. IMPRESSION: Diffuse sclerotic metastatic disease.  No compression deformity. Circumferential epidural disease at the L3 level results in severe canal stenosis. There is also extension into the L3-L4 neural foramina. Sacral epidural disease with involvement of S2 foramina. Multilevel degenerative changes as detailed above. These results will be called to the ordering clinician or representative by the Radiologist Assistant, and communication documented in the PACS or zVision Dashboard. Electronically Signed   By: Macy Mis M.D.   On: 04/10/2019 18:32   MR SACRUM SI JOINTS W WO CONTRAST  Result Date: 04/11/2019 CLINICAL DATA:  Metastatic prostate cancer, low back pain and point tenderness. EXAM: MRI SACRUM WITHOUT CONTRAST TECHNIQUE: Multiplanar multisequence MR imaging of the pelvis was performed. No intravenous contrast was administered. COMPARISON:  PET-CT from 01/30/2019 and MRI lumbar spine from 04/10/2019 FINDINGS: Osseous structures: Extensive multifocal metastatic involvement in the lower lumbar spine, bony pelvis, and right proximal femur with low signal intensity lesions predominating. Expansile lesions  of the right iliac bone extending down into the right  ischium as noted on prior PET-CT, with the resulting width of the right iliac bone at about 5.8 cm as compared to the non expanded 2.2 cm of the left side. There is considerable extraosseous extension of tumor adjacent to the right iliac bone, with soft tissue enhancing tumor deep to the iliacus muscle measuring up to 2.8 cm in thickness on image 7/10, as well as a small rind of tumor posterior to the right iliac bone up to 0.9 cm thickness. The expansion of the iliac bone and right acetabulum causes medial convexity of the acetabulum along the margin of the obturator internus muscle. Lesions in the right proximal femur have a similar configuration to the prior PET-CT of 01/30/2019. Sacral spinal canal: Epidural tumor completely effaces the sacral spinal canal primarily at the S2 level as shown on image 14/12, with tumor in this vicinity measuring 2.7 cm along the axis of the canal, 1.0 cm in thickness, and 2.6 cm transversely. This tumor extends partially into the medial aspect of the S2 neural foramina with likely impingement on the S2 spinal nerves, and the resulting effacement of the sacral component of the thecal sac is likely impinging on the lower sacral nerve roots. Musculotendinous: There is mass effect from the extraosseous spread of tumor from the right iliac bone along the iloipsoas. Generalized moderate regional muscular atrophy in the pelvis. Low-level edema signal in the piriformis musculature (right greater than left) and along the portions of the right gluteal musculature close to the iliac bone. Other: Prostatectomy. IMPRESSION: 1. Epidural tumor anteriorly in the sacral spinal canal at the S2 level measuring 2.7 by 1.0 by 2.6 cm, with mass effect on the S2 nerve roots and below due to effacement of the sacral spinal canal. Minimal extension into the medial S2 neural foramina. 2. Extensive multifocal metastatic involvement in the lower lumbar spine, bony pelvis, and right proximal femur similar to  that noted previously, with expansile lesions in the right iliac bone and right ischium. 3. Extraosseous extension of tumor adjacent to the right iliac bone primarily along the deep margin of the iloipsoas, but also posteriorly adjacent to the gluteal musculature. 4. Nonspecific low-grade edema in the piriformis musculature, right greater than left, possibly neurogenic. Electronically Signed   By: Van Clines M.D.   On: 04/11/2019 08:09    ASSESSMENT & PLAN:   74 y.o. AAM with previous h/o of locally advanced pT3b,pN0 prostate cancer now with concern for newly noted metastatic prostate cancer.  1) Metastatic Prostate Cancer  PSA level 550 pre-treatment and have improved significantly and were down to 0.6. Now gradually rising again and if upt o  49.7  without any other associated new symptoms. Patient had been noted to have extensive osseous metastatic disease throughout the spine and bilaterally in the calvarium as well. Also noted to have local recurrence in the prostatic bed, retroperitoneal Lnadenoapathy and some mediastinal LNadenoapathy.  ECOG PS 1 Previously locally advanced pT3b,pN0 diagnosed in 10/2012 s/p radical prostatectomy and ADT x 1 yr. Testosterone level 7 -- is dropping appropriately with treatment. S/p 6 cycles of taxotere.  12/25/17 PET/CT revealed Solitary retroperitoneal lymph node with radiotracer activity adjacent the IVC most consistent with prostate cancer metastasis. 2. Distant nodal metastasis in the LEFT and RIGHT axilla. Not typical location for prostate cancer metastasis but the activity is intense and favor such. 3. New periosteal reaction within the RIGHT iliac bone and LEFT scapula with intense  radiotracer activity consistent active skeletal metastasis. Additional active metastasis in the RIGHT mandible and proximal RIGHT femur.   04/10/2019 MR SACRUM SI JOINTS W WO CONTRAST (Accession [id number]) revealed "1. Epidural tumor anteriorly in the sacral spinal  canal at the S2 level measuring 2.7 by 1.0 by 2.6 cm, with mass effect on the S2 nerve roots and below due to effacement of the sacral spinal canal. Minimal extension into the medial S2 neural foramina. 2. Extensive multifocal metastatic involvement in the lower lumbar spine, bony pelvis, and right proximal femur similar to that noted previously, with expansile lesions in the right iliac bone and right ischium. 3. Extraosseous extension of tumor adjacent to the right iliac bone primarily along the deep margin of the iloipsoas, but also posteriorly adjacent to the gluteal musculature. 4. Nonspecific low-grade edema in the piriformis musculature, right greater than left, possibly neurogenic."  2) Anterior mandibular pain  Orthopantogram done - showed IMPRESSION: Residual teeth 23-26 with decay.  No mandibular osseous lesion seen Patient has completed dental extractions and is using dentures and  3) Elevated alkaline phosphatase level due to extensive bone mets.   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 Stable. -would anticipate mild anemia from ADT  5) Hematuria due to local recurrence of prostate cancer - 1 episode of hematuria - now resolved  6) Anorexia resolved.. Notes improved po intake with progressive weight gain. Patient notes he's been eating much better. . Wt Readings from Last 3 Encounters:  05/06/19 174 lb 6.4 oz (79.1 kg)  04/24/19 175 lb 11.2 oz (79.7 kg)  04/11/19 181 lb 12.8 oz (82.5 kg)    7) Vit D deficiency with hypocalcemia -- levels are normalizing. -Being aggressively replaced since his calcium levels have dropped after Xgeva likely reflecting significant bone calcium deficits from extensive healing bone lesions. -Continue ergocalciferol 50,000U weekly  -Continue Vit D daily    8) Neoplasm related pain - much improved patient is has not needed much of his prescribed pain medications Plan  -Continue Percocet when necessary for neoplasm related bone  pains, refilled today  -Senna S for bowel prophylaxis  9) Colonic thickening in transverse colon thought to be related to spasm/incomplete distension of colon in this area. -would recommend pursuing colonoscopy with PCP --pending  10) Left shoulder pain Patient has left shoulder pain s/p injury 3-4 months ago.  -XR left shoulder today- reviewed results - No fracture or dislocation is noted in the left shoulder. Sclerosis of scapula is noted consistent with metastatic disease. 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 -monitor  PLAN: -Discussed pt labwork today, 05/06/19; blood counts are steady, blood chemistries are stable, calcium is slightly low -Discussed 05/06/2019 PSA down from209 to 142 -If PSA levels continue to rise would consider a rpt Bone lesion Bx  -Advised pt to take 4 mg of Dexamethasone every other day for 1 week, then discontinue -Advised pt to continue 5 mg Prednisone daily  -Continue Zytiga as prescribed 1000mg  po daily -Continue Lupron every 3 months -Continue Vitamin D as prescribed -Refill Oxycodone, Fentanyl Patch -Will start Zometa today, continue every 4 weeks -Will see back in 1 month with labs   FOLLOW UP: Please schedule for Zometa every 4 weeks with labs x4 doses Please schedule for Lupron every 12 weeks for next 4 doses MD visit with labs in 4 weeks   All of the patient's questions were answered with apparent satisfaction. The patient knows  to call the clinic with any problems, questions or concerns.   Sullivan Lone MD [place] AAHIVMS [facility] Hematology/Oncology Physician [facility]  (Office):       [phone] (Work cell):  [phone] (Fax):           [phone]  I, Yevette Edwards, am acting as a scribe for Dr. Sullivan Lone.   .I have reviewed the above documentation for accuracy and completeness, and I agree with the above. [name] MD

## 2019-05-06 NOTE — Progress Notes (Signed)
Nutrition Assessment   Reason for Assessment: Patient identified on Malnutrition Screening report for weight loss and poor appetite   ASSESSMENT:  74 year old male with metastatic prostate cancer.  Past medical history of radical prostatectomy.  Patient followed by Dr. Irene Limbo.  Met with patient during infusion.  Patient reports that his appetite has not been good but seems to be picking back up after receiving radiation.  Reports that he eats sometimes oatmeal for breakfast or eggs, toast, cereal.  Lunch is chicken and supper is oodles and noodles.  Has church friends that prepare meals for him (chicken and dumplings, soups).  Likes peanut butter crackers, nuts, milk and buttermilk.  Drinks ensure every other day.     Medications: dexamethasone, senna-docusate, predinsone, VIt D   Labs: glucose 105, calcium 8.2   Anthropometrics:   Height: 73 Weight: 174 lb 6.4 oz on 2/9 Noted 205 lb in 10/2018 BMI: 23  15% weight loss in the last 6 months, significant   Estimated Energy Needs  Kcals: 2385-2780 Protein: 119-139 g Fluid: > 2.3 L   NUTRITION DIAGNOSIS: Inadequate oral intake related to cancer as evidenced by 15% weight loss and poor appetite    INTERVENTION:  Discussed ways to increase calories and protein to prevent weight loss.  Handout provided Encouraged drinking ensure daily. Coupons given to patient Patient given contact information   MONITORING, EVALUATION, GOAL: Patient will consume adequate calories and protein to prevent weight loss   Next Visit: March 9th  Clovis Warwick B. Zenia Resides, [place], [place] Registered Dietitian [phone] (pager)

## 2019-05-06 NOTE — Patient Instructions (Signed)
Zoledronic Acid injection (Hypercalcemia, Oncology) What is this medicine? ZOLEDRONIC ACID (ZOE le dron ik AS id) lowers the amount of calcium loss from bone. It is used to treat too much calcium in your blood from cancer. It is also used to prevent complications of cancer that has spread to the bone. This medicine may be used for other purposes; ask your health care provider or pharmacist if you have questions. COMMON BRAND NAME(S): Zometa What should I tell my health care provider before I take this medicine? They need to know if you have any of these conditions:  aspirin-sensitive asthma  cancer, especially if you are receiving medicines used to treat cancer  dental disease or wear dentures  infection  kidney disease  receiving corticosteroids like dexamethasone or prednisone  an unusual or allergic reaction to zoledronic acid, other medicines, foods, dyes, or preservatives  pregnant or trying to get pregnant  breast-feeding How should I use this medicine? This medicine is for infusion into a vein. It is given by a health care professional in a hospital or clinic setting. Talk to your pediatrician regarding the use of this medicine in children. Special care may be needed. Overdosage: If you think you have taken too much of this medicine contact a poison control center or emergency room at once. NOTE: This medicine is only for you. Do not share this medicine with others. What if I miss a dose? It is important not to miss your dose. Call your doctor or health care professional if you are unable to keep an appointment. What may interact with this medicine?  certain antibiotics given by injection  NSAIDs, medicines for pain and inflammation, like ibuprofen or naproxen  some diuretics like bumetanide, furosemide  teriparatide  thalidomide This list may not describe all possible interactions. Give your health care provider a list of all the medicines, herbs, non-prescription  drugs, or dietary supplements you use. Also tell them if you smoke, drink alcohol, or use illegal drugs. Some items may interact with your medicine. What should I watch for while using this medicine? Visit your doctor or health care professional for regular checkups. It may be some time before you see the benefit from this medicine. Do not stop taking your medicine unless your doctor tells you to. Your doctor may order blood tests or other tests to see how you are doing. Women should inform their doctor if they wish to become pregnant or think they might be pregnant. There is a potential for serious side effects to an unborn child. Talk to your health care professional or pharmacist for more information. You should make sure that you get enough calcium and vitamin D while you are taking this medicine. Discuss the foods you eat and the vitamins you take with your health care professional. Some people who take this medicine have severe bone, joint, and/or muscle pain. This medicine may also increase your risk for jaw problems or a broken thigh bone. Tell your doctor right away if you have severe pain in your jaw, bones, joints, or muscles. Tell your doctor if you have any pain that does not go away or that gets worse. Tell your dentist and dental surgeon that you are taking this medicine. You should not have major dental surgery while on this medicine. See your dentist to have a dental exam and fix any dental problems before starting this medicine. Take good care of your teeth while on this medicine. Make sure you see your dentist for regular follow-up   appointments. What side effects may I notice from receiving this medicine? Side effects that you should report to your doctor or health care professional as soon as possible:  allergic reactions like skin rash, itching or hives, swelling of the face, lips, or tongue  anxiety, confusion, or depression  breathing problems  changes in vision  eye  pain  feeling faint or lightheaded, falls  jaw pain, especially after dental work  mouth sores  muscle cramps, stiffness, or weakness  redness, blistering, peeling or loosening of the skin, including inside the mouth  trouble passing urine or change in the amount of urine Side effects that usually do not require medical attention (report to your doctor or health care professional if they continue or are bothersome):  bone, joint, or muscle pain  constipation  diarrhea  fever  hair loss  irritation at site where injected  loss of appetite  nausea, vomiting  stomach upset  trouble sleeping  trouble swallowing  weak or tired This list may not describe all possible side effects. Call your doctor for medical advice about side effects. You may report side effects to FDA at 1-800-FDA-1088. Where should I keep my medicine? This drug is given in a hospital or clinic and will not be stored at home. NOTE: This sheet is a summary. It may not cover all possible information. If you have questions about this medicine, talk to your doctor, pharmacist, or health care provider.  2020 Elsevier/Gold Standard (2013-08-09 14:19:39)  Coronavirus (COVID-19) Are you at risk?  Are you at risk for the Coronavirus (COVID-19)?  To be considered HIGH RISK for Coronavirus (COVID-19), you have to meet the following criteria:  . Traveled to [place], [place], [place], [place] or [place]; or in the [place] to [place], [place], [place], or [place]; and have fever, cough, and shortness of breath within the last 2 weeks of travel OR . Been in close contact with a person diagnosed with COVID-19 within the last 2 weeks and have fever, cough, and shortness of breath . IF YOU DO NOT MEET THESE CRITERIA, YOU ARE CONSIDERED LOW RISK FOR COVID-19.  What to do if you are HIGH RISK for COVID-19?  . If you are having a medical emergency, call 911. . Seek medical care right away. Before you go  to a doctor's office, urgent care or emergency department, call ahead and tell them about your recent travel, contact with someone diagnosed with COVID-19, and your symptoms. You should receive instructions from your physician's office regarding next steps of care.  . When you arrive at healthcare provider, tell the healthcare staff immediately you have returned from visiting [place], [place], [place], [place] or [place]; or traveled in the [place] to [place], [place], [place], or [place]; in the last two weeks or you have been in close contact with a person diagnosed with COVID-19 in the last 2 weeks.   . Tell the health care staff about your symptoms: fever, cough and shortness of breath. . After you have been seen by a medical provider, you will be either: o Tested for (COVID-19) and discharged home on quarantine except to seek medical care if symptoms worsen, and asked to  - Stay home and avoid contact with others until you get your results (4-5 days)  - Avoid travel on public transportation if possible (such as bus, train, or airplane) or o Sent to the Emergency Department by EMS for evaluation, COVID-19 testing, and possible admission depending   on your condition and test results.  What to do if you are LOW RISK for COVID-19?  Reduce your risk of any infection by using the same precautions used for avoiding the common cold or flu:  . Wash your hands often with soap and warm water for at least 20 seconds.  If soap and water are not readily available, use an alcohol-based hand sanitizer with at least 60% alcohol.  . If coughing or sneezing, cover your mouth and nose by coughing or sneezing into the elbow areas of your shirt or coat, into a tissue or into your sleeve (not your hands). . Avoid shaking hands with others and consider head nods or verbal greetings only. . Avoid touching your eyes, nose, or mouth with unwashed hands.  . Avoid close contact with people who are sick. . Avoid  places or events with large numbers of people in one location, like concerts or sporting events. . Carefully consider travel plans you have or are making. . If you are planning any travel outside or inside the [place], visit the CDC's Travelers' Health webpage for the latest health notices. . If you have some symptoms but not all symptoms, continue to monitor at home and seek medical attention if your symptoms worsen. . If you are having a medical emergency, call 911.   ADDITIONAL HEALTHCARE OPTIONS FOR PATIENTS  [facility] / e-Visit: [email or web address]         [facility]: [phone]  [facility]: [phone]                   [facility]: [phone]   

## 2019-05-07 LAB — PSA, TOTAL AND FREE
PSA, Free Pct: 11.5 %
PSA, Free: 16.4 ng/mL
Prostate Specific Ag, Serum: 142 ng/mL — ABNORMAL HIGH (ref 0.0–4.0)

## 2019-05-08 ENCOUNTER — Telehealth: Payer: Self-pay | Admitting: Hematology

## 2019-05-08 NOTE — Telephone Encounter (Signed)
Scheduled per 02/09 los, patient has been called. Voicemail was left, calender will be mailed.

## 2019-05-22 ENCOUNTER — Other Ambulatory Visit: Payer: Self-pay | Admitting: *Deleted

## 2019-05-22 DIAGNOSIS — C61 Malignant neoplasm of prostate: Secondary | ICD-10-CM

## 2019-05-22 DIAGNOSIS — C7951 Secondary malignant neoplasm of bone: Secondary | ICD-10-CM

## 2019-05-22 DIAGNOSIS — G893 Neoplasm related pain (acute) (chronic): Secondary | ICD-10-CM

## 2019-05-22 MED ORDER — FENTANYL 50 MCG/HR TD PT72: 1.0000 | MEDICATED_PATCH | TRANSDERMAL | 0 refills | Status: DC

## 2019-05-22 NOTE — Telephone Encounter (Signed)
Requested refill of fentanyl patch

## 2019-05-27 ENCOUNTER — Other Ambulatory Visit: Payer: Self-pay | Admitting: Hematology

## 2019-05-27 ENCOUNTER — Telehealth: Payer: Self-pay

## 2019-05-27 DIAGNOSIS — C61 Malignant neoplasm of prostate: Secondary | ICD-10-CM

## 2019-05-28 ENCOUNTER — Ambulatory Visit: Payer: Self-pay | Admitting: Radiation Oncology

## 2019-05-28 ENCOUNTER — Ambulatory Visit
Admission: RE | Admit: 2019-05-28 | Discharge: 2019-05-28 | Disposition: A | Discharge status: Home / Self Care | Payer: Medicare Other | Source: Ambulatory Visit | Attending: Urology | Admitting: Urology

## 2019-05-28 ENCOUNTER — Other Ambulatory Visit: Payer: Self-pay

## 2019-05-28 ENCOUNTER — Encounter: Payer: Self-pay | Admitting: Urology

## 2019-05-28 DIAGNOSIS — C7951 Secondary malignant neoplasm of bone: Secondary | ICD-10-CM

## 2019-05-28 NOTE — Progress Notes (Signed)
Radiation Oncology         [phone]) [id number] ________________________________  Name: Eric Osborn. MRN: [id number]  Date: 05/28/2019  DOB: 01-03-1946  Post Treatment Note  CC: [name], NP  [name], NP  Diagnosis:   74 y.o. 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  The patient was notified in advance and gave permission to proceed with this visit format. The patient tolerated radiation treatment relatively well with improved pain.   He has continued on daily Zytiga along with Lupron for ADT.  His most recent PSA on 05/06/19 was stable at 16.4 as compared to the PSA in 03/2019 and his ALK Phosphatase level is decreased at 694 as compared to 846 in 03/2019.                   On review of systems, the patient states that he is doing well in general.  He reports that his pain has improved from a 9-10/10 now to a 5-6/10.  He has continued using the fentanyl patch in addition to taking two 10 mg oxycodone tablets every 6 hours to keep his pain well controlled.  He is going to discuss his pain management at his upcoming follow-up appointment with Dr. Irene Limbo on 06/03/2019 as it sounds like he may need to increase the dose of the fentanyl patch so that the patient is not requiring as much oxycodone for breakthrough pain.  Otherwise, the patient denies any paresthesias, focal weakness, or focal pain in the spine or extremities.  He reports normal bowel and bladder function and denies any abdominal pain, nausea, vomiting, diarrhea or constipation.  He continues taking stool softeners daily to prevent severe constipation.  He  continues with a decent appetite and is maintaining his weight.  Overall, he is pleased with his progress to date.   ALLERGIES:  is allergic to heparin and poison ivy extract [poison ivy extract].  Meds: Current Outpatient Medications  Medication Sig Dispense Refill  . abiraterone acetate (ZYTIGA) 250 MG tablet TAKE 4 TABLETS (1,000 MG TOTAL) BY MOUTH DAILY. TAKE ON AN EMPTY STOMACH 1 HOUR BEFORE OR 2 HOURS AFTER A MEAL 120 tablet 1  . denosumab (XGEVA) 120 MG/1.7ML SOLN injection Inject 120 mg into the skin once.    [name] dexamethasone (DECADRON) 4 MG tablet Take 1 tablet (4 mg total) by mouth 2 (two) times daily with breakfast and lunch. 60 tablet 0  . fentaNYL (DURAGESIC) 50 MCG/HR Place 1 patch onto the skin every 3 (three) days. 10 patch 0  . leuprolide (LUPRON) 22.5 MG injection Inject 22.5 mg into the muscle every 3 (three) months.    . lidocaine (LIDODERM) 5 % Place 1 patch onto the skin daily. Apply to area of maximal pain over lower back. Remove & Discard patch within 12 hours or as directed by MD 30 patch 0  . mupirocin ointment (BACTROBAN) 2 % Apply 1 application topically 4 (four) times daily. 30 g 0  . Oxycodone HCl 10 MG TABS Take 1-2 tablets (10-20 mg total) by mouth every 4 (four) hours as needed (severe cancer related pain -  Metastatic prostate cancer). 120 tablet 0  . predniSONE (DELTASONE) 5 MG tablet TAKE 1 TABLET (5 MG TOTAL) BY MOUTH DAILY WITH BREAKFAST. 30 tablet 2  . senna-docusate (SENNA S) 8.6-50 MG tablet Take 2 tablets by mouth at bedtime. 60 tablet 1  . Vitamin D, Ergocalciferol, (DRISDOL) 1.25 MG (50000 UNIT) CAPS capsule Take 1 capsule (50,000 Units total) by mouth 2 (two) times a week. 24 capsule 6   Current Facility-Administered Medications  Medication Dose Route Frequency Provider Last Rate Last Admin  . morphine 4 MG/ML injection 2 mg  2 mg Intramuscular Once [place] Arch, PA-C        Physical Findings:  vitals were not taken for this visit.   Karen Kays to  assess due to telephone follow-up visit format.  Lab Findings: Lab Results  Component Value Date   WBC 3.9 (L) 05/06/2019   HGB 11.7 (L) 05/06/2019   HCT 37.3 (L) 05/06/2019   MCV 88.2 05/06/2019   PLT 245 05/06/2019     Radiographic Findings: No results found.  Impression/Plan: 1. 74 y.o. 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 He appears to have recovered well from the effects of his recent palliative radiotherapy to the spine with improved back pain.  However, he continues with diffuse pains that require two 10 mg oxycodone tablets every 6 hours along with his 50 mcg fentanyl patch.   He may need an adjustment in the dose of the fentanyl patch but will discuss this further at his upcoming follow-up visit with Dr. Irene Limbo.  We discussed that while we are happy to continue to participate in his care if clinically indicated, at this point, we will plan to see him back on an as-needed basis.   He will continue in routine follow-up under the care and direction of Dr. Irene Limbo for continued systemic disease management.  He and his sister, Stanton Kidney, appear to have a good understanding of these recommendations and are comfortable and in agreement with the stated plan.  They know that they are welcome to call at anytime with any questions or concerns related to his previous radiotherapy.    Nicholos Johns, PA-C

## 2019-06-02 ENCOUNTER — Other Ambulatory Visit: Payer: Medicare Other

## 2019-06-02 ENCOUNTER — Ambulatory Visit: Payer: Medicare Other

## 2019-06-03 ENCOUNTER — Inpatient Hospital Stay (HOSPITAL_BASED_OUTPATIENT_CLINIC_OR_DEPARTMENT_OTHER): Payer: Medicare Other | Admitting: Hematology

## 2019-06-03 ENCOUNTER — Inpatient Hospital Stay: Payer: Medicare Other

## 2019-06-03 ENCOUNTER — Ambulatory Visit: Payer: Medicare Other

## 2019-06-03 ENCOUNTER — Other Ambulatory Visit: Payer: Self-pay

## 2019-06-03 ENCOUNTER — Inpatient Hospital Stay: Payer: Medicare Other | Attending: Hematology

## 2019-06-03 VITALS — HR 100

## 2019-06-03 VITALS — BP 133/87 | HR 108 | Temp 98.2°F | Resp 16 | Ht 73.0 in | Wt 162.7 lb

## 2019-06-03 DIAGNOSIS — G893 Neoplasm related pain (acute) (chronic): Secondary | ICD-10-CM

## 2019-06-03 DIAGNOSIS — C61 Malignant neoplasm of prostate: Secondary | ICD-10-CM

## 2019-06-03 DIAGNOSIS — E876 Hypokalemia: Secondary | ICD-10-CM | POA: Diagnosis not present

## 2019-06-03 DIAGNOSIS — Z7189 Other specified counseling: Secondary | ICD-10-CM

## 2019-06-03 DIAGNOSIS — C7951 Secondary malignant neoplasm of bone: Secondary | ICD-10-CM

## 2019-06-03 LAB — CBC WITH DIFFERENTIAL/PLATELET
Abs Immature Granulocytes: 0.06 10*3/uL (ref 0.00–0.07)
Basophils Absolute: 0 10*3/uL (ref 0.0–0.1)
Basophils Relative: 0 %
Eosinophils Absolute: 0.1 10*3/uL (ref 0.0–0.5)
Eosinophils Relative: 1 %
HCT: 38.6 % — ABNORMAL LOW (ref 39.0–52.0)
Hemoglobin: 12.1 g/dL — ABNORMAL LOW (ref 13.0–17.0)
Immature Granulocytes: 1 %
Lymphocytes Relative: 40 %
Lymphs Abs: 2.9 10*3/uL (ref 0.7–4.0)
MCH: 26.9 pg (ref 26.0–34.0)
MCHC: 31.3 g/dL (ref 30.0–36.0)
MCV: 86 fL (ref 80.0–100.0)
Monocytes Absolute: 0.7 10*3/uL (ref 0.1–1.0)
Monocytes Relative: 9 %
Neutro Abs: 3.6 10*3/uL (ref 1.7–7.7)
Neutrophils Relative %: 49 %
Platelets: 369 10*3/uL (ref 150–400)
RBC: 4.49 MIL/uL (ref 4.22–5.81)
RDW: 15.7 % — ABNORMAL HIGH (ref 11.5–15.5)
WBC: 7.4 10*3/uL (ref 4.0–10.5)
nRBC: 0 % (ref 0.0–0.2)

## 2019-06-03 LAB — CMP (CANCER CENTER ONLY)
ALT: 6 U/L (ref 0–44)
AST: 45 U/L — ABNORMAL HIGH (ref 15–41)
Albumin: 2.8 g/dL — ABNORMAL LOW (ref 3.5–5.0)
Alkaline Phosphatase: 541 U/L — ABNORMAL HIGH (ref 38–126)
Anion gap: 5 (ref 5–15)
BUN: 5 mg/dL — ABNORMAL LOW (ref 8–23)
CO2: 31 mmol/L (ref 22–32)
Calcium: 7.7 mg/dL — ABNORMAL LOW (ref 8.9–10.3)
Chloride: 103 mmol/L (ref 98–111)
Creatinine: 0.58 mg/dL — ABNORMAL LOW (ref 0.61–1.24)
GFR, Est AFR Am: >60 mL/min (ref >60)
GFR, Estimated: >60 mL/min (ref >60)
Glucose, Bld: 111 mg/dL — ABNORMAL HIGH (ref 70–99)
Potassium: 2.8 mmol/L — CL (ref 3.5–5.1)
Sodium: 139 mmol/L (ref 135–145)
Total Bilirubin: 0.5 mg/dL (ref 0.3–1.2)
Total Protein: 6.4 g/dL — ABNORMAL LOW (ref 6.5–8.1)

## 2019-06-03 MED ORDER — POTASSIUM CHLORIDE CRYS ER 20 MEQ PO TBCR
40.0000 meq | EXTENDED_RELEASE_TABLET | Freq: Once | ORAL | Status: AC
  Administered 2019-06-03: 13:00:00 40 meq via ORAL

## 2019-06-03 MED ORDER — ZOLEDRONIC ACID 4 MG/100ML IV SOLN
4.0000 mg | Freq: Once | INTRAVENOUS | Status: AC
  Administered 2019-06-03: 13:00:00 4 mg via INTRAVENOUS

## 2019-06-03 MED ORDER — ZOLEDRONIC ACID 4 MG/100ML IV SOLN
INTRAVENOUS | Status: AC
  Filled 2019-06-03: qty 100

## 2019-06-03 MED ORDER — SODIUM CHLORIDE 0.9 % IV SOLN
INTRAVENOUS | Status: DC
  Filled 2019-06-03: qty 250

## 2019-06-03 MED ORDER — OXYCODONE HCL 10 MG PO TABS: 10.0000 mg | ORAL_TABLET | ORAL | 0 refills | Status: DC | PRN

## 2019-06-03 MED ORDER — PREDNISONE 5 MG PO TABS: 5.0000 mg | ORAL_TABLET | Freq: Every day | ORAL | 2 refills | Status: DC

## 2019-06-03 MED ORDER — POTASSIUM CHLORIDE CRYS ER 20 MEQ PO TBCR
EXTENDED_RELEASE_TABLET | ORAL | Status: AC
  Filled 2019-06-03: qty 2

## 2019-06-03 MED ORDER — POTASSIUM CHLORIDE CRYS ER 20 MEQ PO TBCR: 20.0000 meq | EXTENDED_RELEASE_TABLET | Freq: Two times a day (BID) | ORAL | 1 refills | Status: DC

## 2019-06-03 NOTE — Patient Instructions (Addendum)
**Please resume taking your Calcium supplement daily and pick up your potassium supplement from your pharmacy**  Hypokalemia Hypokalemia means that the amount of potassium in the blood is lower than normal. Potassium is a chemical (electrolyte) that helps regulate the amount of fluid in the body. It also stimulates muscle tightening (contraction) and helps nerves work properly. Normally, most of the body's potassium is inside cells, and only a very small amount is in the blood. Because the amount in the blood is so small, minor changes to potassium levels in the blood can be life-threatening. What are the causes? This condition may be caused by:  Antibiotic medicine.  Diarrhea or vomiting. Taking too much of a medicine that helps you have a bowel movement (laxative) can cause diarrhea and lead to hypokalemia.  Chronic kidney disease (CKD).  Medicines that help the body get rid of excess fluid (diuretics).  Eating disorders, such as bulimia.  Low magnesium levels in the body.  Sweating a lot. What are the signs or symptoms? Symptoms of this condition include:  Weakness.  Constipation.  Fatigue.  Muscle cramps.  Mental confusion.  Skipped heartbeats or irregular heartbeat (palpitations).  Tingling or numbness. How is this diagnosed? This condition is diagnosed with a blood test. How is this treated? This condition may be treated by:  Taking potassium supplements by mouth.  Adjusting the medicines that you take.  Eating more foods that contain a lot of potassium. If your potassium level is very low, you may need to get potassium through an IV and be monitored in the hospital. Follow these instructions at home:   Take over-the-counter and prescription medicines only as told by your health care provider. This includes vitamins and supplements.  Eat a healthy diet. A healthy diet includes fresh fruits and vegetables, whole grains, healthy fats, and lean proteins.  If  instructed, eat more foods that contain a lot of potassium. This includes: ? Nuts, such as peanuts and pistachios. ? Seeds, such as sunflower seeds and pumpkin seeds. ? Peas, lentils, and lima beans. ? Whole grain and bran cereals and breads. ? Fresh fruits and vegetables, such as apricots, avocado, bananas, cantaloupe, kiwi, oranges, tomatoes, asparagus, and potatoes. ? Orange juice. ? Tomato juice. ? Red meats. ? Yogurt.  Keep all follow-up visits as told by your health care provider. This is important. Contact a health care provider if you:  Have weakness that gets worse.  Feel your heart pounding or racing.  Vomit.  Have diarrhea.  Have diabetes (diabetes mellitus) and you have trouble keeping your blood sugar (glucose) in your target range. Get help right away if you:  Have chest pain.  Have shortness of breath.  Have vomiting or diarrhea that lasts for more than 2 days.  Faint. Summary  Hypokalemia means that the amount of potassium in the blood is lower than normal.  This condition is diagnosed with a blood test.  Hypokalemia may be treated by taking potassium supplements, adjusting the medicines that you take, or eating more foods that are high in potassium.  If your potassium level is very low, you may need to get potassium through an IV and be monitored in the hospital. This information is not intended to replace advice given to you by your health care provider. Make sure you discuss any questions you have with your health care provider. Document Revised: 10/24/2017 Document Reviewed: 10/24/2017 Elsevier Patient Education  [place].  Zoledronic Acid injection (Hypercalcemia, Oncology) What is this medicine? ZOLEDRONIC  ACID (ZOE le dron ik AS id) lowers the amount of calcium loss from bone. It is used to treat too much calcium in your blood from cancer. It is also used to prevent complications of cancer that has spread to the bone. This medicine may  be used for other purposes; ask your health care provider or pharmacist if you have questions. COMMON BRAND NAME(S): Zometa What should I tell my health care provider before I take this medicine? They need to know if you have any of these conditions:  aspirin-sensitive asthma  cancer, especially if you are receiving medicines used to treat cancer  dental disease or wear dentures  infection  kidney disease  receiving corticosteroids like dexamethasone or prednisone  an unusual or allergic reaction to zoledronic acid, other medicines, foods, dyes, or preservatives  pregnant or trying to get pregnant  breast-feeding How should I use this medicine? This medicine is for infusion into a vein. It is given by a health care professional in a hospital or clinic setting. Talk to your pediatrician regarding the use of this medicine in children. Special care may be needed. Overdosage: If you think you have taken too much of this medicine contact a poison control center or emergency room at once. NOTE: This medicine is only for you. Do not share this medicine with others. What if I miss a dose? It is important not to miss your dose. Call your doctor or health care professional if you are unable to keep an appointment. What may interact with this medicine?  certain antibiotics given by injection  NSAIDs, medicines for pain and inflammation, like ibuprofen or naproxen  some diuretics like bumetanide, furosemide  teriparatide  thalidomide This list may not describe all possible interactions. Give your health care provider a list of all the medicines, herbs, non-prescription drugs, or dietary supplements you use. Also tell them if you smoke, drink alcohol, or use illegal drugs. Some items may interact with your medicine. What should I watch for while using this medicine? Visit your doctor or health care professional for regular checkups. It may be some time before you see the benefit from  this medicine. Do not stop taking your medicine unless your doctor tells you to. Your doctor may order blood tests or other tests to see how you are doing. Women should inform their doctor if they wish to become pregnant or think they might be pregnant. There is a potential for serious side effects to an unborn child. Talk to your health care professional or pharmacist for more information. You should make sure that you get enough calcium and vitamin D while you are taking this medicine. Discuss the foods you eat and the vitamins you take with your health care professional. Some people who take this medicine have severe bone, joint, and/or muscle pain. This medicine may also increase your risk for jaw problems or a broken thigh bone. Tell your doctor right away if you have severe pain in your jaw, bones, joints, or muscles. Tell your doctor if you have any pain that does not go away or that gets worse. Tell your dentist and dental surgeon that you are taking this medicine. You should not have major dental surgery while on this medicine. See your dentist to have a dental exam and fix any dental problems before starting this medicine. Take good care of your teeth while on this medicine. Make sure you see your dentist for regular follow-up appointments. What side effects may I notice from receiving  this medicine? Side effects that you should report to your doctor or health care professional as soon as possible:  allergic reactions like skin rash, itching or hives, swelling of the face, lips, or tongue  anxiety, confusion, or depression  breathing problems  changes in vision  eye pain  feeling faint or lightheaded, falls  jaw pain, especially after dental work  mouth sores  muscle cramps, stiffness, or weakness  redness, blistering, peeling or loosening of the skin, including inside the mouth  trouble passing urine or change in the amount of urine Side effects that usually do not require  medical attention (report to your doctor or health care professional if they continue or are bothersome):  bone, joint, or muscle pain  constipation  diarrhea  fever  hair loss  irritation at site where injected  loss of appetite  nausea, vomiting  stomach upset  trouble sleeping  trouble swallowing  weak or tired This list may not describe all possible side effects. Call your doctor for medical advice about side effects. You may report side effects to FDA at 1-800-FDA-1088. Where should I keep my medicine? This drug is given in a hospital or clinic and will not be stored at home. NOTE: This sheet is a summary. It may not cover all possible information. If you have questions about this medicine, talk to your doctor, pharmacist, or health care provider.  2020 Elsevier/Gold Standard (2013-08-09 14:19:39)  Potassium Chloride Extended-Release Capsules What is this medicine? POTASSIUM CHLORIDE (poe TASS i um KLOOR ide) is a potassium supplement used to prevent and to treat low potassium. Potassium is important for the heart, muscles, and nerves. Too much or too little potassium in the body can cause serious problems. This medicine may be used for other purposes; ask your health care provider or pharmacist if you have questions. COMMON BRAND NAME(S): Klor-Con, Micro-K, Micro-K Extencaps What should I tell my health care provider before I take this medicine? They need to know if you have any of these conditions:  Addison disease  dehydration  diabetes, high blood sugar  difficulty swallowing  heart disease  high levels of potassium in the blood  irregular heartbeat or rhythm  kidney disease  large areas of burned skin  stomach ulcers, other stomach or intestine problems  an unusual or allergic reaction to potassium, other medicines, foods, dyes, or preservatives  pregnant or trying to get pregnant  breast-feeding How should I use this medicine? Take this  drug by mouth with a glass of water. Take it as directed on the prescription label at the same time every day. Take it with food. Do not cut, crush, chew, or suck this drug. Swallow the capsules whole. You may open the capsule and put the contents in a teaspoon of soft food, such as applesauce or pudding. Do not add to hot foods. Swallow the mixture right away. Do not chew the mixture. Drink a glass of water or juice after taking the mixture. Keep taking this medicine unless your health care provider tells you to stop. Talk to your health care provider about the use of this drug in children. Special care may be needed. Overdosage: If you think you have taken too much of this medicine contact a poison control center or emergency room at once. NOTE: This medicine is only for you. Do not share this medicine with others. What if I miss a dose? If you miss a dose, take it as soon as you can. If it is almost  time for your next dose, take only that dose. Do not take double or extra doses. What may interact with this medicine? Do not take this medicine with any of the following medications:  certain diuretics such as spironolactone, triamterene  certain medicines for stomach problems like atropine; difenoxin and glycopyrrolate  eplerenone  sodium polystyrene sulfonate This medicine may also interact with the following medications:  certain medicines for blood pressure or heart disease like lisinopril, losartan, quinapril, valsartan  medicines that lower your chance of fighting infection such as cyclosporine, tacrolimus  NSAIDs, medicines for pain and inflammation, like ibuprofen or naproxen  other potassium supplements  salt substitutes This list may not describe all possible interactions. Give your health care provider a list of all the medicines, herbs, non-prescription drugs, or dietary supplements you use. Also tell them if you smoke, drink alcohol, or use illegal drugs. Some items may  interact with your medicine. What should I watch for while using this medicine? Visit your doctor or health care professional for regular check ups. You will need lab work done regularly. You may need to be on a special diet while taking this medicine. Ask your doctor. What side effects may I notice from receiving this medicine? Side effects that you should report to your doctor or health care professional as soon as possible:  allergic reactions like skin rash, itching or hives, swelling of the face, lips, or tongue  black, tarry stools  breathing problems  confusion  heartburn  fast, irregular heartbeat  feeling faint or lightheaded, falls  low blood pressure  numbness or tingling in hands or feet  pain when swallowing  unusually weak or tired  weakness, heaviness of legs Side effects that usually do not require medical attention (report to your doctor or health care professional if they continue or are bothersome):  diarrhea  nausea, vomiting  stomach pain This list may not describe all possible side effects. Call your doctor for medical advice about side effects. You may report side effects to FDA at 1-800-FDA-1088. Where should I keep my medicine? Keep out of the reach of children. Store at room temperature between 15 and 30 degrees C (59 and 86 degrees F ). Keep bottle closed tightly to protect this medicine from light and moisture. Throw away any unused medicine after the expiration date. NOTE: This sheet is a summary. It may not cover all possible information. If you have questions about this medicine, talk to your doctor, pharmacist, or health care provider.  2020 Elsevier/Gold Standard (2019-01-07 16:43:28)

## 2019-06-03 NOTE — Progress Notes (Signed)
HEMATOLOGY/ONCOLOGY CLINIC NOTE  Date of Service: 06/03/19   Patient Care Team: [name], NP as PCP - General (Internal Medicine)  CHIEF COMPLAINTS/PURPOSE OF CONSULTATION:   F/u for continue mx of metastatic prostate cancer  HISTORY OF PRESENTING ILLNESS:  Eric Howle. is a wonderful 74 y.o. male who has been referred to [place] from the [facility] long hospital ED by [name] PA-C for evaluation and management of metastatic malignancy concerning for metastatic prostate cancer.  Patient has had a h/o locally advanced prostate cancer diagnosed in 2014 and had a radical prostatectomy and pelvic LN dissection by Dr [name] in 10/2012. Pathology showed pT3b pN0 disease (with positive margins, extraprostatic extension, seminal vesicle involvement and angiolymphatic invasion). Bone scan was negative for metastatic disease. Patient report no post-op RT. He notes that he received lupron shots as per his urologist for 1 year post-operatively. He was being monitored by Dr [name] and last had clinical evaluation and PSA about 29months ago - PSA level not available in our system. He notes that he was lost to f/u since then.  Patient presented to the ED with worsening lower and middle back pain for 2 weeks. Also notes about 15-20lbs weight loss and anorexia over the last 6-8weeks. He also notes hematuria. No fevers/chills. Patient had a CT abd/pelvis in the ED which showed Innumerable sclerotic lesions in all imaged bones consistent metastatic disease. The prostate gland has an irregular appearance and the patient may have prostate carcinoma. A few enlarged retroperitoneal lymph nodes are identified worrisome for additional foci of metastatic disease.  Patient's pain was somewhat controlled with prn percocet in ED and he was discharged home with f/u with [place]. Patient notes that the patient wakes him up in the night.  No urinary retention. No loss of bowel or bladder control. No new  neurological symptoms in his extremities.   INTERVAL HISTORY   Eric Osborn. is here to follow up of his metastatic prostatic cancer. We are joined today by his friend. The patient's last visit with [place] was on 05/06/2019. The pt reports that he is doing well overall.  The pt reports that he has discontinued taking Prednisone. He believes that Oxycodone is causing him to be more emotional. Pt was taking up to 4-5 per day. He has continued using the Fentanyl patch in addition and his pain is well-controlled at this time. He denies doing excessive yard work. Pt has noticed a recent lack of appetite. Pt has continued taking a stool softener to help with constipation and is having a bowel movement daily.   He received his first dose of the COVID19 vaccine on Friday and denies any concerns or residual side effects.  Lab results today (06/03/19) of CBC w/diff and CMP is as follows: all values are WNL except for Hgb at 12.1, HCT at 38.6, RDW at 15.7, Potassium at 2.8, Glucose at 111, BUN at 5, Creatinine at 0.58, Calcium at 7.7, Total Protein at 6.4, Albumin at 2.8, AST at 45, ALP at 541.  On review of systems, pt reports lack of appetite and denies sleeplessness, constipation and any other symptoms.   MEDICAL HISTORY:   Past Medical History:  Diagnosis Date  . Arthritis    left knee, left shoulder  . Erectile dysfunction 06/25/2012  . Foley catheter in place   . Goiter 06/25/2012  . H/O hiatal hernia   . Hematuria    occasional  . Hemorrhoid   . History of  bladder infections   . Joint pain   . Prostate cancer [facility])     SURGICAL HISTORY: Past Surgical History:  Procedure Laterality Date  . INGUINAL HERNIA REPAIR  8 yrs ago  . LYMPHADENECTOMY Bilateral 11/20/2012   Procedure: WITH BILATERAL PELVIC LYMPH NODE DISSECTION ;  Surgeon: [name], MD;  Location: [facility] ORS;  Service: Urology;  Laterality: Bilateral;  . ROBOT ASSISTED LAPAROSCOPIC RADICAL PROSTATECTOMY N/A 11/20/2012    Procedure: ROBOTIC ASSISTED LAPAROSCOPIC RADICAL PROSTATECTOMY;  Surgeon: [name], MD;  Location: [facility];  Service: Urology;  Laterality: N/A;    SOCIAL HISTORY: Social History   Socioeconomic History  . Marital status: Divorced    Spouse name: Not on file  . Number of children: 3  . Years of education: 9  . Highest education level: Not on file  Occupational History  . Occupation: Retired    Comment: textile  Tobacco Use  . Smoking status: Former Smoker    Packs/day: 0.25    Years: 25.00    Pack years: 6.25    Types: Cigarettes    Quit date: 03/27/2014    Years since quitting: 5.1  . Smokeless tobacco: Former Systems developer  . Tobacco comment: quit date 2016  Substance and Sexual Activity  . Alcohol use: Not Currently    Comment: occasional beer   . Drug use: Yes    Types: Marijuana    Comment: 3 x a month uses marijuana  . Sexual activity: Not Currently  Other Topics Concern  . Not on file  Social History Narrative   Regular exercise-no   Caffeine Use-yes   Social Determinants of Health   Financial Resource Strain:   . Difficulty of Paying Living Expenses: Not on file  Food Insecurity:   . Worried About Charity fundraiser in the Last Year: Not on file  . Ran Out of Food in the Last Year: Not on file  Transportation Needs:   . Lack of Transportation (Medical): Not on file  . Lack of Transportation (Non-Medical): Not on file  Physical Activity:   . Days of Exercise per Week: Not on file  . Minutes of Exercise per Session: Not on file  Stress:   . Feeling of Stress : Not on file  Social Connections:   . Frequency of Communication with Friends and Family: Not on file  . Frequency of Social Gatherings with Friends and Family: Not on file  . Attends Religious Services: Not on file  . Active Member of Clubs or Organizations: Not on file  . Attends Archivist Meetings: Not on file  . Marital Status: Not on file  Intimate Partner Violence:   . Fear of  Current or Ex-Partner: Not on file  . Emotionally Abused: Not on file  . Physically Abused: Not on file  . Sexually Abused: Not on file    FAMILY HISTORY: Family History  Problem Relation Age of Onset  . Heart disease Sister   . Stroke Sister   . Colon cancer Paternal Uncle   . Cancer Daughter        unknown  . Prostate cancer Paternal Uncle   . Diabetes Neg Hx   . Breast cancer Neg Hx     ALLERGIES:  is allergic to heparin and poison ivy extract [poison ivy extract].  MEDICATIONS:  Current Outpatient Medications  Medication Sig Dispense Refill  . abiraterone acetate (ZYTIGA) 250 MG tablet TAKE 4 TABLETS (1,000 MG TOTAL) BY MOUTH DAILY. TAKE ON  AN EMPTY STOMACH 1 HOUR BEFORE OR 2 HOURS AFTER A MEAL 120 tablet 1  . fentaNYL (DURAGESIC) 50 MCG/HR Place 1 patch onto the skin every 3 (three) days. 10 patch 0  . leuprolide (LUPRON) 22.5 MG injection Inject 22.5 mg into the muscle every 3 (three) months.    . lidocaine (LIDODERM) 5 % Place 1 patch onto the skin daily. Apply to area of maximal pain over lower back. Remove & Discard patch within 12 hours or as directed by MD 30 patch 0  . mupirocin ointment (BACTROBAN) 2 % Apply 1 application topically 4 (four) times daily. 30 g 0  . Oxycodone HCl 10 MG TABS Take 1-2 tablets (10-20 mg total) by mouth every 4 (four) hours as needed (severe cancer related pain - Metastatic prostate cancer). 120 tablet 0  . senna-docusate (SENNA S) 8.6-50 MG tablet Take 2 tablets by mouth at bedtime. 60 tablet 1  . Vitamin D, Ergocalciferol, (DRISDOL) 1.25 MG (50000 UNIT) CAPS capsule Take 1 capsule (50,000 Units total) by mouth 2 (two) times a week. 24 capsule 6  . potassium chloride SA (KLOR-CON) 20 MEQ tablet Take 1 tablet (20 mEq total) by mouth 2 (two) times daily. 60 tablet 1  . predniSONE (DELTASONE) 5 MG tablet Take 1 tablet (5 mg total) by mouth daily with breakfast. 30 tablet 2   Current Facility-Administered Medications  Medication Dose Route  Frequency Provider Last Rate Last Admin  . morphine 4 MG/ML injection 2 mg  2 mg Intramuscular Once Bruning, Ashlyn, PA-C      . potassium chloride SA (KLOR-CON) CR tablet 40 mEq  40 mEq Oral Once [name], MD        REVIEW OF SYSTEMS:   A 10+ POINT REVIEW OF SYSTEMS WAS OBTAINED including neurology, dermatology, psychiatry, cardiac, respiratory, lymph, extremities, GI, GU, Musculoskeletal, constitutional, breasts, reproductive, HEENT.  All pertinent positives are noted in the HPI.  All others are negative.   PHYSICAL EXAMINATION: ECOG FS:1 - Symptomatic but completely ambulatory  Vitals:   06/03/19 1159  BP: 133/87  Pulse: (!) 108  Resp: 16  Temp: 98.2 F (36.8 C)  SpO2: 100%   Wt Readings from Last 3 Encounters:  06/03/19 162 lb 11.2 oz (73.8 kg)  05/06/19 174 lb 6.4 oz (79.1 kg)  04/24/19 175 lb 11.2 oz (79.7 kg)   Body mass index is 21.47 kg/m.    GENERAL:alert, in no acute distress and comfortable SKIN: no acute rashes, no significant lesions EYES: conjunctiva are pink and non-injected, sclera anicteric OROPHARYNX: MMM, no exudates, no oropharyngeal erythema or ulceration NECK: supple, no JVD LYMPH:  no palpable lymphadenopathy in the cervical, axillary or inguinal regions LUNGS: clear to auscultation b/l with normal respiratory effort HEART: regular rate & rhythm ABDOMEN:  normoactive bowel sounds , non tender, not distended. No palpable hepatosplenomegaly.  Extremity: no pedal edema PSYCH: alert & oriented x 3 with fluent speech NEURO: no focal motor/sensory deficits  LABORATORY DATA:  I have reviewed the data as listed  . CBC Latest Ref Rng & Units 06/03/2019 05/06/2019 04/24/2019  WBC 4.0 - 10.5 K/uL 7.4 3.9(L) 5.7  Hemoglobin 13.0 - 17.0 g/dL 12.1(L) 11.7(L) 12.4(L)  Hematocrit 39.0 - 52.0 % 38.6(L) 37.3(L) 39.9  Platelets 150 - 400 K/uL 369 245 313    CMP Latest Ref Rng & Units 06/03/2019 05/06/2019 04/24/2019  Glucose 70 - 99 mg/dL 111(H) 105(H)  101(H)  BUN 8 - 23 mg/dL 5(L) 10 11  Creatinine 0.61 -  1.24 mg/dL 0.58(L) 0.59(L) 0.63  Sodium 135 - 145 mmol/L 139 140 138  Potassium 3.5 - 5.1 mmol/L 2.8(LL) 4.1 3.9  Chloride 98 - 111 mmol/L 103 109 101  CO2 22 - 32 mmol/L 31 26 28   Calcium 8.9 - 10.3 mg/dL 7.7(L) 8.2(L) 8.4(L)  Total Protein 6.5 - 8.1 g/dL 6.4(L) 6.2(L) 6.9  Total Bilirubin 0.3 - 1.2 mg/dL 0.5 0.3 0.4  Alkaline Phos 38 - 126 U/L 541(H) 694(H) 846(H)  AST 15 - 41 U/L 45(H) 24 34  ALT 0 - 44 U/L 6 8 9    .     08/14/2017 Peripheral Blood Flow Cytometry           RADIOGRAPHIC STUDIES: I have personally reviewed the radiological images as listed and agreed with the findings in the report. No results found.  ASSESSMENT & PLAN:   75 y.o. AAM with previous h/o of locally advanced pT3b,pN0 prostate cancer now with concern for newly noted metastatic prostate cancer.  1) Metastatic Prostate Cancer  PSA level 550 pre-treatment and have improved significantly and were down to 0.6. Now gradually rising again and if upt o  49.7  without any other associated new symptoms. Patient had been noted to have extensive osseous metastatic disease throughout the spine and bilaterally in the calvarium as well. Also noted to have local recurrence in the prostatic bed, retroperitoneal Lnadenoapathy and some mediastinal LNadenoapathy.  ECOG PS 1 Previously locally advanced pT3b,pN0 diagnosed in 10/2012 s/p radical prostatectomy and ADT x 1 yr. Testosterone level 7 -- is dropping appropriately with treatment. S/p 6 cycles of taxotere.  12/25/17 PET/CT revealed Solitary retroperitoneal lymph node with radiotracer activity adjacent the IVC most consistent with prostate cancer metastasis. 2. Distant nodal metastasis in the LEFT and RIGHT axilla. Not typical location for prostate cancer metastasis but the activity is intense and favor such. 3. New periosteal reaction within the RIGHT iliac bone and LEFT scapula with intense  radiotracer activity consistent active skeletal metastasis. Additional active metastasis in the RIGHT mandible and proximal RIGHT femur.   04/10/2019 MR SACRUM SI JOINTS W WO CONTRAST (Accession [id number]) revealed "1. Epidural tumor anteriorly in the sacral spinal canal at the S2 level measuring 2.7 by 1.0 by 2.6 cm, with mass effect on the S2 nerve roots and below due to effacement of the sacral spinal canal. Minimal extension into the medial S2 neural foramina. 2. Extensive multifocal metastatic involvement in the lower lumbar spine, bony pelvis, and right proximal femur similar to that noted previously, with expansile lesions in the right iliac bone and right ischium. 3. Extraosseous extension of tumor adjacent to the right iliac bone primarily along the deep margin of the iloipsoas, but also posteriorly adjacent to the gluteal musculature. 4. Nonspecific low-grade edema in the piriformis musculature, right greater than left, possibly neurogenic."  2) Anterior mandibular pain  Orthopantogram done - showed IMPRESSION: Residual teeth 23-26 with decay.  No mandibular osseous lesion seen Patient has completed dental extractions and is using dentures and  3) Elevated alkaline phosphatase level due to extensive bone mets.   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 Stable. -would anticipate mild anemia from ADT  5) Hematuria due to local recurrence of prostate cancer - 1 episode of hematuria - now resolved  6) Anorexia resolved.. Notes improved po intake with progressive weight gain. Patient notes he's been eating much better. . Wt Readings from Last 3 Encounters:  06/03/19 162 lb 11.2 oz (73.8 kg)  05/06/19 174 lb 6.4  oz (79.1 kg)  04/24/19 175 lb 11.2 oz (79.7 kg)    7) Vit D deficiency with hypocalcemia -- levels are normalizing. -Being aggressively replaced since his calcium levels have dropped after Xgeva likely reflecting significant bone calcium deficits from extensive  healing bone lesions. -Continue ergocalciferol 50,000U weekly  -Continue Vit D daily    8) Neoplasm related pain - much improved patient is has not needed much of his prescribed pain medications Plan  -Continue Percocet when necessary for neoplasm related bone pains, refilled today  -Senna S for bowel prophylaxis  9) Colonic thickening in transverse colon thought to be related to spasm/incomplete distension of colon in this area. -would recommend pursuing colonoscopy with PCP --pending  10) Left shoulder pain Patient has left shoulder pain s/p injury 3-4 months ago.  -XR left shoulder today- reviewed results - No fracture or dislocation is noted in the left shoulder. Sclerosis of scapula is noted consistent with metastatic disease. 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  -Last PSA down from 209 to 142 -Reduction in PSA could be from Zytiga + Lupron or localized RT -If PSA levels begin to rise again would consider a rpt Bone Lesion Bx  -Potassium is very low. Will start on potassium supplement -Advised pt to restart 5 mg Prednisone daily -Continue Zytiga as prescribed 1000mg  PO daily -Continue Lupron every 3 months -Continue Vitamin D as prescribed -Continue Zometa every 4 weeks -Continue Oxycodone prn for pain management -Recommend pt continue to avoid pushing, pulling, twisting or lifting anything heavy -Recommend pt f/u as scheduled for the second dose of the COVID19 vaccine -Refill Oxycodone, Prednisone -Rx Potassium Supplement -Will see back in 2 months with labs   FOLLOW UP: Please schedule for Zometa every 4 weeks with labs x4 doses Please  schedule for Lupron every 12 weeks for next 4 doses MD visit with labs in 8 weeks  The total time spent in the appt was 30 minutes and more than 50% was on counseling and direct patient cares.  All of the patient's questions were answered with apparent satisfaction. The patient knows to call the clinic with any problems, questions or concerns.   Sullivan Lone MD [place] AAHIVMS [facility] Hematology/Oncology Physician [facility]  (Office):       [phone] (Work cell):  [phone] (Fax):           [phone]  I, Yevette Edwards, am acting as a scribe for Dr. Sullivan Lone.   .I have reviewed the above documentation for accuracy and completeness, and I agree with the above. [name] MD

## 2019-06-03 NOTE — Progress Notes (Signed)
Per Dr. Irene Limbo, [place] to treat with calcium level.

## 2019-06-03 NOTE — Progress Notes (Signed)
CRITICAL VALUE STICKER  CRITICAL VALUE: Potassium 2.8  RECEIVER (on-site recipient of call): Eric Osborn, [place] NOTIFIED: 06/03/2019 @ 12:25  MD NOTIFIED: Dr. Sullivan Lone  TIME OF NOTIFICATION: 12:26  RESPONSE: Order for PO potassium, encouraged to eat potassium rich foods.

## 2019-06-03 NOTE — Progress Notes (Signed)
Nutrition Follow-up:  Patient with metastatic prostate cancer.  Patient followed by Dr. Irene Limbo.    RD missed patient in infusion completed early.    RD called patient times 2 this afternoon and was not able to talk for very long.  Reports that appetite is so-so.  Drinking ensure every day.    Anthropometrics:   Weight decreased to 162 lb 11.2 oz from 174 lb 6.4 oz on 2/9   NUTRITION DIAGNOSIS: Inadequate oral intake continues   INTERVENTION:  Patient unable to speak with RD this afternoon via phone.     NEXT VISIT: to be determined  Sabryn Preslar B. Zenia Resides, [place], [place] Registered Dietitian [phone] (pager)

## 2019-06-05 NOTE — Telephone Encounter (Signed)
Spoke with patient to remind of telephone encounter on 05/28/19. Patient verbalized he understood

## 2019-06-06 ENCOUNTER — Telehealth: Payer: Self-pay | Admitting: Hematology

## 2019-06-06 NOTE — Telephone Encounter (Signed)
Scheduled per los, called patient regarding updated appointments. Left a voicemail.

## 2019-06-12 ENCOUNTER — Other Ambulatory Visit: Payer: Self-pay | Admitting: Hematology

## 2019-06-13 ENCOUNTER — Other Ambulatory Visit: Payer: Self-pay | Admitting: Hematology

## 2019-06-13 ENCOUNTER — Telehealth: Payer: Self-pay | Admitting: *Deleted

## 2019-06-13 MED ORDER — DRONABINOL 2.5 MG PO CAPS: 2.5000 mg | ORAL_CAPSULE | Freq: Two times a day (BID) | ORAL | 0 refills | Status: DC

## 2019-06-13 NOTE — Telephone Encounter (Signed)
Patient/friend contacted office and asked if there was anything Dr. Irene Limbo could prescribe to increase patient's appetite. Dr. Irene Limbo informed. Dr. Irene Limbo prescribed Marinol for patient. Contacted patient with this information - he verbalized understanding.

## 2019-06-23 ENCOUNTER — Telehealth: Payer: Self-pay | Admitting: *Deleted

## 2019-06-23 NOTE — Telephone Encounter (Signed)
Eric Osborn states Mr Eric Osborn needs a refill of oxycodone

## 2019-06-24 ENCOUNTER — Other Ambulatory Visit: Payer: Self-pay | Admitting: Hematology

## 2019-06-24 MED ORDER — OXYCODONE HCL 10 MG PO TABS: 10.0000 mg | ORAL_TABLET | ORAL | 0 refills | Status: DC | PRN

## 2019-07-01 ENCOUNTER — Other Ambulatory Visit: Payer: Medicare Other

## 2019-07-01 ENCOUNTER — Inpatient Hospital Stay: Payer: Medicare Other | Attending: Hematology

## 2019-07-01 ENCOUNTER — Inpatient Hospital Stay: Payer: Medicare Other

## 2019-07-01 ENCOUNTER — Other Ambulatory Visit: Payer: Self-pay | Admitting: *Deleted

## 2019-07-01 ENCOUNTER — Other Ambulatory Visit: Payer: Self-pay

## 2019-07-01 ENCOUNTER — Ambulatory Visit: Payer: Medicare Other

## 2019-07-01 VITALS — BP 157/94 | HR 97 | Temp 98.5°F | Resp 16

## 2019-07-01 DIAGNOSIS — Z5111 Encounter for antineoplastic chemotherapy: Secondary | ICD-10-CM | POA: Insufficient documentation

## 2019-07-01 DIAGNOSIS — C7951 Secondary malignant neoplasm of bone: Secondary | ICD-10-CM

## 2019-07-01 DIAGNOSIS — Z7189 Other specified counseling: Secondary | ICD-10-CM

## 2019-07-01 DIAGNOSIS — C61 Malignant neoplasm of prostate: Secondary | ICD-10-CM | POA: Diagnosis not present

## 2019-07-01 DIAGNOSIS — G893 Neoplasm related pain (acute) (chronic): Secondary | ICD-10-CM

## 2019-07-01 LAB — CBC WITH DIFFERENTIAL/PLATELET
Abs Immature Granulocytes: 0.03 10*3/uL (ref 0.00–0.07)
Basophils Absolute: 0 10*3/uL (ref 0.0–0.1)
Basophils Relative: 0 %
Eosinophils Absolute: 0.1 10*3/uL (ref 0.0–0.5)
Eosinophils Relative: 2 %
HCT: 34.7 % — ABNORMAL LOW (ref 39.0–52.0)
Hemoglobin: 10.6 g/dL — ABNORMAL LOW (ref 13.0–17.0)
Immature Granulocytes: 0 %
Lymphocytes Relative: 35 %
Lymphs Abs: 2.6 10*3/uL (ref 0.7–4.0)
MCH: 26.2 pg (ref 26.0–34.0)
MCHC: 30.5 g/dL (ref 30.0–36.0)
MCV: 85.7 fL (ref 80.0–100.0)
Monocytes Absolute: 0.4 10*3/uL (ref 0.1–1.0)
Monocytes Relative: 6 %
Neutro Abs: 4.2 10*3/uL (ref 1.7–7.7)
Neutrophils Relative %: 57 %
Platelets: 330 10*3/uL (ref 150–400)
RBC: 4.05 MIL/uL — ABNORMAL LOW (ref 4.22–5.81)
RDW: 17 % — ABNORMAL HIGH (ref 11.5–15.5)
WBC: 7.4 10*3/uL (ref 4.0–10.5)
nRBC: 0 % (ref 0.0–0.2)

## 2019-07-01 LAB — CMP (CANCER CENTER ONLY)
ALT: <6 U/L (ref 0–44)
AST: 48 U/L — ABNORMAL HIGH (ref 15–41)
Albumin: 2.7 g/dL — ABNORMAL LOW (ref 3.5–5.0)
Alkaline Phosphatase: 747 U/L — ABNORMAL HIGH (ref 38–126)
Anion gap: 9 (ref 5–15)
BUN: 6 mg/dL — ABNORMAL LOW (ref 8–23)
CO2: 28 mmol/L (ref 22–32)
Calcium: 8.3 mg/dL — ABNORMAL LOW (ref 8.9–10.3)
Chloride: 104 mmol/L (ref 98–111)
Creatinine: 0.52 mg/dL — ABNORMAL LOW (ref 0.61–1.24)
GFR, Est AFR Am: >60 mL/min (ref >60)
GFR, Estimated: >60 mL/min (ref >60)
Glucose, Bld: 98 mg/dL (ref 70–99)
Potassium: 3.2 mmol/L — ABNORMAL LOW (ref 3.5–5.1)
Sodium: 141 mmol/L (ref 135–145)
Total Bilirubin: 0.4 mg/dL (ref 0.3–1.2)
Total Protein: 6.4 g/dL — ABNORMAL LOW (ref 6.5–8.1)

## 2019-07-01 MED ORDER — SODIUM CHLORIDE 0.9 % IV SOLN
INTRAVENOUS | Status: DC
  Filled 2019-07-01: qty 250

## 2019-07-01 MED ORDER — OXYCODONE-ACETAMINOPHEN 5-325 MG PO TABS
ORAL_TABLET | ORAL | Status: AC
  Filled 2019-07-01: qty 2

## 2019-07-01 MED ORDER — ZOLEDRONIC ACID 4 MG/100ML IV SOLN
4.0000 mg | Freq: Once | INTRAVENOUS | Status: AC
  Administered 2019-07-01: 4 mg via INTRAVENOUS

## 2019-07-01 MED ORDER — OXYCODONE-ACETAMINOPHEN 5-325 MG PO TABS
2.0000 | ORAL_TABLET | Freq: Once | ORAL | Status: AC
  Administered 2019-07-01: 2 via ORAL

## 2019-07-01 MED ORDER — ZOLEDRONIC ACID 4 MG/100ML IV SOLN
INTRAVENOUS | Status: AC
  Filled 2019-07-01: qty 100

## 2019-07-01 MED ORDER — LEUPROLIDE ACETATE (3 MONTH) 22.5 MG ~~LOC~~ KIT
22.5000 mg | PACK | Freq: Once | SUBCUTANEOUS | Status: AC
  Administered 2019-07-01: 22.5 mg via SUBCUTANEOUS
  Filled 2019-07-01: qty 22.5

## 2019-07-01 NOTE — Patient Instructions (Signed)
Zoledronic Acid injection (Hypercalcemia, Oncology) What is this medicine? ZOLEDRONIC ACID (ZOE le dron ik AS id) lowers the amount of calcium loss from bone. It is used to treat too much calcium in your blood from cancer. It is also used to prevent complications of cancer that has spread to the bone. This medicine may be used for other purposes; ask your health care provider or pharmacist if you have questions. COMMON BRAND NAME(S): Zometa What should I tell my health care provider before I take this medicine? They need to know if you have any of these conditions:  aspirin-sensitive asthma  cancer, especially if you are receiving medicines used to treat cancer  dental disease or wear dentures  infection  kidney disease  receiving corticosteroids like dexamethasone or prednisone  an unusual or allergic reaction to zoledronic acid, other medicines, foods, dyes, or preservatives  pregnant or trying to get pregnant  breast-feeding How should I use this medicine? This medicine is for infusion into a vein. It is given by a health care professional in a hospital or clinic setting. Talk to your pediatrician regarding the use of this medicine in children. Special care may be needed. Overdosage: If you think you have taken too much of this medicine contact a poison control center or emergency room at once. NOTE: This medicine is only for you. Do not share this medicine with others. What if I miss a dose? It is important not to miss your dose. Call your doctor or health care professional if you are unable to keep an appointment. What may interact with this medicine?  certain antibiotics given by injection  NSAIDs, medicines for pain and inflammation, like ibuprofen or naproxen  some diuretics like bumetanide, furosemide  teriparatide  thalidomide This list may not describe all possible interactions. Give your health care provider a list of all the medicines, herbs, non-prescription  drugs, or dietary supplements you use. Also tell them if you smoke, drink alcohol, or use illegal drugs. Some items may interact with your medicine. What should I watch for while using this medicine? Visit your doctor or health care professional for regular checkups. It may be some time before you see the benefit from this medicine. Do not stop taking your medicine unless your doctor tells you to. Your doctor may order blood tests or other tests to see how you are doing. Women should inform their doctor if they wish to become pregnant or think they might be pregnant. There is a potential for serious side effects to an unborn child. Talk to your health care professional or pharmacist for more information. You should make sure that you get enough calcium and vitamin D while you are taking this medicine. Discuss the foods you eat and the vitamins you take with your health care professional. Some people who take this medicine have severe bone, joint, and/or muscle pain. This medicine may also increase your risk for jaw problems or a broken thigh bone. Tell your doctor right away if you have severe pain in your jaw, bones, joints, or muscles. Tell your doctor if you have any pain that does not go away or that gets worse. Tell your dentist and dental surgeon that you are taking this medicine. You should not have major dental surgery while on this medicine. See your dentist to have a dental exam and fix any dental problems before starting this medicine. Take good care of your teeth while on this medicine. Make sure you see your dentist for regular follow-up   appointments. What side effects may I notice from receiving this medicine? Side effects that you should report to your doctor or health care professional as soon as possible:  allergic reactions like skin rash, itching or hives, swelling of the face, lips, or tongue  anxiety, confusion, or depression  breathing problems  changes in vision  eye  pain  feeling faint or lightheaded, falls  jaw pain, especially after dental work  mouth sores  muscle cramps, stiffness, or weakness  redness, blistering, peeling or loosening of the skin, including inside the mouth  trouble passing urine or change in the amount of urine Side effects that usually do not require medical attention (report to your doctor or health care professional if they continue or are bothersome):  bone, joint, or muscle pain  constipation  diarrhea  fever  hair loss  irritation at site where injected  loss of appetite  nausea, vomiting  stomach upset  trouble sleeping  trouble swallowing  weak or tired This list may not describe all possible side effects. Call your doctor for medical advice about side effects. You may report side effects to FDA at 1-800-FDA-1088. Where should I keep my medicine? This drug is given in a hospital or clinic and will not be stored at home. NOTE: This sheet is a summary. It may not cover all possible information. If you have questions about this medicine, talk to your doctor, pharmacist, or health care provider.  2020 [employer]vier/Gold Standard (2013-08-09 14:19:39)  Coronavirus (COVID-19) Are you at risk?  Are you at risk for the Coronavirus (COVID-19)?  To be considered HIGH RISK for Coronavirus (COVID-19), you have to meet the following criteria:  . Traveled to [place], [place], [place], [place] or [place]; or in the [place] to [place], [place], [place], or [place]; and have fever, cough, and shortness of breath within the last 2 weeks of travel OR . Been in close contact with a person diagnosed with COVID-19 within the last 2 weeks and have fever, cough, and shortness of breath . IF YOU DO NOT MEET THESE CRITERIA, YOU ARE CONSIDERED LOW RISK FOR COVID-19.  What to do if you are HIGH RISK for COVID-19?  . If you are having a medical emergency, call 911. . Seek medical care right away. Before you go  to a doctor's office, urgent care or emergency department, call ahead and tell them about your recent travel, contact with someone diagnosed with COVID-19, and your symptoms. You should receive instructions from your physician's office regarding next steps of care.  . When you arrive at healthcare provider, tell the healthcare staff immediately you have returned from visiting [place], [place], [place], [place] or [place]; or traveled in the [place] to [place], [place], [place], or [place]; in the last two weeks or you have been in close contact with a person diagnosed with COVID-19 in the last 2 weeks.   . Tell the health care staff about your symptoms: fever, cough and shortness of breath. . After you have been seen by a medical provider, you will be either: o Tested for (COVID-19) and discharged home on quarantine except to seek medical care if symptoms worsen, and asked to  - Stay home and avoid contact with others until you get your results (4-5 days)  - Avoid travel on public transportation if possible (such as bus, train, or airplane) or o Sent to the Emergency Department by EMS for evaluation, COVID-19 testing, and possible admission depending   on your condition and test results.  What to do if you are LOW RISK for COVID-19?  Reduce your risk of any infection by using the same precautions used for avoiding the common cold or flu:  . Wash your hands often with soap and warm water for at least 20 seconds.  If soap and water are not readily available, use an alcohol-based hand sanitizer with at least 60% alcohol.  . If coughing or sneezing, cover your mouth and nose by coughing or sneezing into the elbow areas of your shirt or coat, into a tissue or into your sleeve (not your hands). . Avoid shaking hands with others and consider head nods or verbal greetings only. . Avoid touching your eyes, nose, or mouth with unwashed hands.  . Avoid close contact with people who are sick. . Avoid  places or events with large numbers of people in one location, like concerts or sporting events. . Carefully consider travel plans you have or are making. . If you are planning any travel outside or inside the [place], visit the CDC's Travelers' Health webpage for the latest health notices. . If you have some symptoms but not all symptoms, continue to monitor at home and seek medical attention if your symptoms worsen. . If you are having a medical emergency, call 911.   ADDITIONAL HEALTHCARE OPTIONS FOR PATIENTS  [facility] / e-Visit: [email or web address]         [facility]: [phone]  [facility]: [phone]                   [facility]: [phone]   

## 2019-07-01 NOTE — Progress Notes (Signed)
Okay to proceed with Zometa today with Calcium per Dr. Irene Limbo.

## 2019-07-01 NOTE — Telephone Encounter (Signed)
Error

## 2019-07-02 LAB — PSA, TOTAL AND FREE
PSA, Free Pct: 12 %
PSA, Free: 29.6 ng/mL
Prostate Specific Ag, Serum: 246 ng/mL — ABNORMAL HIGH (ref 0.0–4.0)

## 2019-07-07 ENCOUNTER — Other Ambulatory Visit: Payer: Self-pay | Admitting: *Deleted

## 2019-07-08 MED ORDER — OXYCODONE HCL 10 MG PO TABS: 10.0000 mg | ORAL_TABLET | ORAL | 0 refills | Status: DC | PRN

## 2019-07-21 ENCOUNTER — Telehealth: Payer: Self-pay

## 2019-07-21 NOTE — Telephone Encounter (Signed)
TC to pt to let him know when his next appointments were. I let him know that his next appointments are 07/29/19. LAB @9 , KALE @9 :40, NUT @10 :30, and INF @10 :30 for 1 hour. Patient verbalized understanding.

## 2019-07-28 NOTE — Progress Notes (Signed)
HEMATOLOGY/ONCOLOGY CLINIC NOTE  Date of Service: 07/29/19   Patient Care Team: [name], NP as PCP - General (Internal Medicine)  CHIEF COMPLAINTS/PURPOSE OF CONSULTATION:   F/u for continue mx of metastatic prostate cancer  HISTORY OF PRESENTING ILLNESS:  Eric Osborn. is a wonderful 74 y.o. male who has been referred to [place] from the [facility] long hospital ED by [name] PA-C for evaluation and management of metastatic malignancy concerning for metastatic prostate cancer.  Patient has had a h/o locally advanced prostate cancer diagnosed in 2014 and had a radical prostatectomy and pelvic LN dissection by Dr [name] in 10/2012. Pathology showed pT3b pN0 disease (with positive margins, extraprostatic extension, seminal vesicle involvement and angiolymphatic invasion). Bone scan was negative for metastatic disease. Patient report no post-op RT. He notes that he received lupron shots as per his urologist for 1 year post-operatively. He was being monitored by Dr [name] and last had clinical evaluation and PSA about 66months ago - PSA level not available in our system. He notes that he was lost to f/u since then.  Patient presented to the ED with worsening lower and middle back pain for 2 weeks. Also notes about 15-20lbs weight loss and anorexia over the last 6-8weeks. He also notes hematuria. No fevers/chills. Patient had a CT abd/pelvis in the ED which showed Innumerable sclerotic lesions in all imaged bones consistent metastatic disease. The prostate gland has an irregular appearance and the patient may have prostate carcinoma. A few enlarged retroperitoneal lymph nodes are identified worrisome for additional foci of metastatic disease.  Patient's pain was somewhat controlled with prn percocet in ED and he was discharged home with f/u with [place]. Patient notes that the patient wakes him up in the night.  No urinary retention. No loss of bowel or bladder control. No new  neurological symptoms in his extremities.   INTERVAL HISTORY   Eric Osborn. is here to follow up of his metastatic prostatic cancer. We are joined today by his friend. The patient's last visit with [place] was on 06/03/2019. The pt reports that he is doing well overall.  The pt reports that his left shoulder has been hurting worse since our last visit. The Fentanyl Patch is helping with his pain, but stops after the second day. He is also eating at about 25% of his baseline. Pt is using two Boost supplements per day, but he often lacks the desire to eat. He has lost over 10 lbs since our last visit. Pt has been taking his Potassium supplement once per day.   Lab results today (07/29/19) of CBC w/diff and CMP is as follows: all values are WNL except for RBC at 3.77, Hgb at 9.7, HCT at 31.2, MCH at 25.7, RDW at 17.2, PLT at 446K, Potassium at 2.8, Glucose at 107, Creatinine at 0.54, Calcium at 8.3, Albumin at 2.3, AST at 62, ALP at 610. 07/29/2019 PSA is in progress  On review of systems, pt reports left shoulder pain, loss of appetite, weight loss, constipation and denies dysuria, hematuria, abdominal pain and any other symptoms.    MEDICAL HISTORY:   Past Medical History:  Diagnosis Date  . Arthritis    left knee, left shoulder  . Erectile dysfunction 06/25/2012  . Foley catheter in place   . Goiter 06/25/2012  . H/O hiatal hernia   . Hematuria    occasional  . Hemorrhoid   . History of bladder infections   . Joint  pain   . Prostate cancer [facility])     SURGICAL HISTORY: Past Surgical History:  Procedure Laterality Date  . INGUINAL HERNIA REPAIR  8 yrs ago  . LYMPHADENECTOMY Bilateral 11/20/2012   Procedure: WITH BILATERAL PELVIC LYMPH NODE DISSECTION ;  Surgeon: [name], MD;  Location: [facility] ORS;  Service: Urology;  Laterality: Bilateral;  . ROBOT ASSISTED LAPAROSCOPIC RADICAL PROSTATECTOMY N/A 11/20/2012   Procedure: ROBOTIC ASSISTED LAPAROSCOPIC RADICAL PROSTATECTOMY;  Surgeon:  [name], MD;  Location: [facility] OR[facility];  Service: Urology;  Laterality: N/A;    SOCIAL HISTORY: Social History   Socioeconomic History  . Marital status: Divorced    Spouse name: Not on file  . Number of children: 3  . Years of education: 9  . Highest education level: Not on file  Occupational History  . Occupation: Retired    Comment: textile  Tobacco Use  . Smoking status: Former Smoker    Packs/day: 0.25    Years: 25.00    Pack years: 6.25    Types: Cigarettes    Quit date: 03/27/2014    Years since quitting: 5.3  . Smokeless tobacco: Former Systems developer  . Tobacco comment: quit date 2016  Substance and Sexual Activity  . Alcohol use: Not Currently    Comment: occasional beer   . Drug use: Yes    Types: Marijuana    Comment: 3 x a month uses marijuana  . Sexual activity: Not Currently  Other Topics Concern  . Not on file  Social History Narrative   Regular exercise-no   Caffeine Use-yes   Social Determinants of Health   Financial Resource Strain:   . Difficulty of Paying Living Expenses:   Food Insecurity:   . Worried About Charity fundraiser in the Last Year:   . Arboriculturist in the Last Year:   Transportation Needs:   . Film/video editor (Medical):   [name] Lack of Transportation (Non-Medical):   Physical Activity:   . Days of Exercise per Week:   . Minutes of Exercise per Session:   Stress:   . Feeling of Stress :   Social Connections:   . Frequency of Communication with Friends and Family:   . Frequency of Social Gatherings with Friends and Family:   . Attends Religious Services:   . Active Member of Clubs or Organizations:   . Attends Archivist Meetings:   [name] Marital Status:   Intimate Partner Violence:   . Fear of Current or Ex-Partner:   . Emotionally Abused:   [name] Physically Abused:   . Sexually Abused:     FAMILY HISTORY: Family History  Problem Relation Age of Onset  . Heart disease Sister   . Stroke Sister   . Colon cancer  Paternal Uncle   . Cancer Daughter        unknown  . Prostate cancer Paternal Uncle   . Diabetes Neg Hx   . Breast cancer Neg Hx     ALLERGIES:  is allergic to heparin and poison ivy extract [poison ivy extract].  MEDICATIONS:  Current Outpatient Medications  Medication Sig Dispense Refill  . abiraterone acetate (ZYTIGA) 250 MG tablet TAKE 4 TABLETS (1,000 MG TOTAL) BY MOUTH DAILY. TAKE ON AN EMPTY STOMACH 1 HOUR BEFORE OR 2 HOURS AFTER A MEAL 120 tablet 1  . dronabinol (MARINOL) 2.5 MG capsule Take 1 capsule (2.5 mg total) by mouth 2 (two) times daily before a meal. 60 capsule 0  .  fentaNYL (DURAGESIC) 50 MCG/HR Place 1 patch onto the skin every 3 (three) days. 10 patch 0  . leuprolide (LUPRON) 22.5 MG injection Inject 22.5 mg into the muscle every 3 (three) months.    . lidocaine (LIDODERM) 5 % Place 1 patch onto the skin daily. Apply to area of maximal pain over lower back. Remove & Discard patch within 12 hours or as directed by MD 30 patch 0  . mupirocin ointment (BACTROBAN) 2 % Apply 1 application topically 4 (four) times daily. 30 g 0  . Oxycodone HCl 10 MG TABS Take 1-2 tablets (10-20 mg total) by mouth every 4 (four) hours as needed (severe cancer related pain - Metastatic prostate cancer). 120 tablet 0  . potassium chloride SA (KLOR-CON) 20 MEQ tablet Take 2 tablets (40 mEq total) by mouth 2 (two) times daily. 120 tablet 2  . predniSONE (DELTASONE) 5 MG tablet Take 1 tablet (5 mg total) by mouth daily with breakfast. 30 tablet 2  . senna-docusate (SENNA S) 8.6-50 MG tablet Take 2 tablets by mouth at bedtime. 60 tablet 1  . Vitamin D, Ergocalciferol, (DRISDOL) 1.25 MG (50000 UNIT) CAPS capsule Take 1 capsule (50,000 Units total) by mouth 2 (two) times a week. 24 capsule 6   Current Facility-Administered Medications  Medication Dose Route Frequency Provider Last Rate Last Admin  . morphine 4 MG/ML injection 2 mg  2 mg Intramuscular Once Bruning, Ashlyn, PA-C      . potassium  chloride SA (KLOR-CON) CR tablet 40 mEq  40 mEq Oral Once [name], MD       Facility-Administered Medications Ordered in Other Visits  Medication Dose Route Frequency Provider Last Rate Last Admin  . Leuprolide Acetate (3 Month) (ELIGARD) 22.5 MG injection 22.5 mg  22.5 mg Subcutaneous Once [name], MD      . Zoledronic Acid (ZOMETA) IVPB 4 mg  4 mg Intravenous Once [name], MD        REVIEW OF SYSTEMS:   A 10+ POINT REVIEW OF SYSTEMS WAS OBTAINED including neurology, dermatology, psychiatry, cardiac, respiratory, lymph, extremities, GI, GU, Musculoskeletal, constitutional, breasts, reproductive, HEENT.  All pertinent positives are noted in the HPI.  All others are negative.   PHYSICAL EXAMINATION: ECOG FS:1 - Symptomatic but completely ambulatory  Vitals:   07/29/19 0954  BP: 113/81  Pulse: (!) 105  Resp: 18  Temp: 98 F (36.7 C)  SpO2: 98%   Wt Readings from Last 3 Encounters:  07/29/19 146 lb 1.6 oz (66.3 kg)  06/03/19 162 lb 11.2 oz (73.8 kg)  05/06/19 174 lb 6.4 oz (79.1 kg)   Body mass index is 19.28 kg/m.    Exam was given in a chair   GENERAL:alert, in no acute distress and comfortable SKIN: no acute rashes, no significant lesions EYES: conjunctiva are pink and non-injected, sclera anicteric OROPHARYNX: MMM, no exudates, no oropharyngeal erythema or ulceration NECK: supple, no JVD LYMPH:  no palpable lymphadenopathy in the cervical, axillary or inguinal regions LUNGS: clear to auscultation b/l with normal respiratory effort HEART: regular rate & rhythm ABDOMEN:  normoactive bowel sounds , non tender, not distended. No palpable hepatosplenomegaly.  Extremity: no pedal edema PSYCH: alert & oriented x 3 with fluent speech NEURO: no focal motor/sensory deficits  LABORATORY DATA:  I have reviewed the data as listed  . CBC Latest Ref Rng & Units 07/29/2019 07/01/2019 06/03/2019  WBC 4.0 - 10.5 K/uL 6.1 7.4 7.4  Hemoglobin 13.0 -  17.0 g/dL  9.7(L) 10.6(L) 12.1(L)  Hematocrit 39.0 - 52.0 % 31.2(L) 34.7(L) 38.6(L)  Platelets 150 - 400 K/uL 446(H) 330 369    CMP Latest Ref Rng & Units 07/29/2019 07/01/2019 06/03/2019  Glucose 70 - 99 mg/dL 107(H) 98 111(H)  BUN 8 - 23 mg/dL 9 6(L) 5(L)  Creatinine 0.61 - 1.24 mg/dL 0.54(L) 0.52(L) 0.58(L)  Sodium 135 - 145 mmol/L 140 141 139  Potassium 3.5 - 5.1 mmol/L 2.8(LL) 3.2(L) 2.8(LL)  Chloride 98 - 111 mmol/L 102 104 103  CO2 22 - 32 mmol/L 29 28 31   Calcium 8.9 - 10.3 mg/dL 8.3(L) 8.3(L) 7.7(L)  Total Protein 6.5 - 8.1 g/dL 6.7 6.4(L) 6.4(L)  Total Bilirubin 0.3 - 1.2 mg/dL 0.5 0.4 0.5  Alkaline Phos 38 - 126 U/L 610(H) 747(H) 541(H)  AST 15 - 41 U/L 62(H) 48(H) 45(H)  ALT 0 - 44 U/L 6 <6 6   .     08/14/2017 Peripheral Blood Flow Cytometry           RADIOGRAPHIC STUDIES: I have personally reviewed the radiological images as listed and agreed with the findings in the report. No results found.  ASSESSMENT & PLAN:   75 y.o. AAM with previous h/o of locally advanced pT3b,pN0 prostate cancer now with concern for newly noted metastatic prostate cancer.  1) Metastatic Prostate Cancer  PSA level 550 pre-treatment and have improved significantly and were down to 0.6. Now gradually rising again and if upt o  49.7  without any other associated new symptoms. Patient had been noted to have extensive osseous metastatic disease throughout the spine and bilaterally in the calvarium as well. Also noted to have local recurrence in the prostatic bed, retroperitoneal Lnadenoapathy and some mediastinal LNadenoapathy.  ECOG PS 1 Previously locally advanced pT3b,pN0 diagnosed in 10/2012 s/p radical prostatectomy and ADT x 1 yr. Testosterone level 7 -- is dropping appropriately with treatment. S/p 6 cycles of taxotere.  12/25/17 PET/CT revealed Solitary retroperitoneal lymph node with radiotracer activity adjacent the IVC most consistent with prostate cancer metastasis. 2. Distant  nodal metastasis in the LEFT and RIGHT axilla. Not typical location for prostate cancer metastasis but the activity is intense and favor such. 3. New periosteal reaction within the RIGHT iliac bone and LEFT scapula with intense radiotracer activity consistent active skeletal metastasis. Additional active metastasis in the RIGHT mandible and proximal RIGHT femur.   04/10/2019 MR SACRUM SI JOINTS W WO CONTRAST (Accession [id number]) revealed "1. Epidural tumor anteriorly in the sacral spinal canal at the S2 level measuring 2.7 by 1.0 by 2.6 cm, with mass effect on the S2 nerve roots and below due to effacement of the sacral spinal canal. Minimal extension into the medial S2 neural foramina. 2. Extensive multifocal metastatic involvement in the lower lumbar spine, bony pelvis, and right proximal femur similar to that noted previously, with expansile lesions in the right iliac bone and right ischium. 3. Extraosseous extension of tumor adjacent to the right iliac bone primarily along the deep margin of the iloipsoas, but also posteriorly adjacent to the gluteal musculature. 4. Nonspecific low-grade edema in the piriformis musculature, right greater than left, possibly neurogenic."  2) Anterior mandibular pain  Orthopantogram done - showed IMPRESSION: Residual teeth 23-26 with decay.  No mandibular osseous lesion seen Patient has completed dental extractions and is using dentures and  3) Elevated alkaline phosphatase level due to extensive bone mets.   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 Stable. -would anticipate mild anemia from ADT  5)  Hematuria due to local recurrence of prostate cancer - 1 episode of hematuria - now resolved  6) Anorexia resolved.. Notes improved po intake with progressive weight gain. Patient notes he's been eating much better. . Wt Readings from Last 3 Encounters:  07/29/19 146 lb 1.6 oz (66.3 kg)  06/03/19 162 lb 11.2 oz (73.8 kg)  05/06/19 174 lb 6.4 oz  (79.1 kg)    7) Vit D deficiency with hypocalcemia -- levels are normalizing. -Being aggressively replaced since his calcium levels have dropped after Xgeva likely reflecting significant bone calcium deficits from extensive healing bone lesions. -Continue ergocalciferol 50,000U weekly  -Continue Vit D daily    8) Neoplasm related pain - much improved patient is has not needed much of his prescribed pain medications Plan  -Continue Percocet when necessary for neoplasm related bone pains, refilled today  -Senna S for bowel prophylaxis  9) Colonic thickening in transverse colon thought to be related to spasm/incomplete distension of colon in this area. -would recommend pursuing colonoscopy with PCP --pending  10) Left shoulder pain Patient has left shoulder pain s/p injury 3-4 months ago.  -XR left shoulder today- reviewed results - No fracture or dislocation is noted in the left shoulder. Sclerosis of scapula is noted consistent with metastatic disease. 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 Advised not to operate heavy machinery while under the influence of the medication.  -Advised pt to change Fentanyl Patch every 2 days -Recommend pt take 2 Potassium tablets BID -Recommend to use Senna as prescribed  -Continue Zytiga 1000 mg PO daily -Continue Lupron every 3 months -Continue Zometa every 4  weeks  -Refill Fentanyl Patch, Oxycodone -Rx higher dose Marinol  -Will get PET/CT in 5-7 days -Will see back in 10 days via phone   FOLLOW UP: PET/CT (Auximin) in 5-7 days Phone visit with Dr Irene Limbo in 10 days    The total time spent in the appt was 30 minutes and more than 50% was on counseling and direct patient cares.  All of the patient's questions were answered with apparent satisfaction. The patient knows to call the clinic with any problems, questions or concerns.   Sullivan Lone MD [place] AAHIVMS [facility] Hematology/Oncology Physician [facility]  (Office):       [phone] (Work cell):  [phone] (Fax):           [phone]  I, Yevette Edwards, am acting as a scribe for Dr. Sullivan Lone.   .I have reviewed the above documentation for accuracy and completeness, and I agree with the above. [name] MD

## 2019-07-29 ENCOUNTER — Inpatient Hospital Stay: Payer: Medicare Other

## 2019-07-29 ENCOUNTER — Other Ambulatory Visit: Payer: Medicare Other

## 2019-07-29 ENCOUNTER — Ambulatory Visit: Payer: Medicare Other

## 2019-07-29 ENCOUNTER — Other Ambulatory Visit: Payer: Self-pay

## 2019-07-29 ENCOUNTER — Inpatient Hospital Stay: Payer: Medicare Other | Attending: Hematology

## 2019-07-29 ENCOUNTER — Inpatient Hospital Stay (HOSPITAL_BASED_OUTPATIENT_CLINIC_OR_DEPARTMENT_OTHER): Payer: Medicare Other | Admitting: Hematology

## 2019-07-29 ENCOUNTER — Other Ambulatory Visit: Payer: Self-pay | Admitting: Hematology

## 2019-07-29 VITALS — BP 113/81 | HR 105 | Temp 98.0°F | Resp 18 | Ht 73.0 in | Wt 146.1 lb

## 2019-07-29 DIAGNOSIS — Z7189 Other specified counseling: Secondary | ICD-10-CM

## 2019-07-29 DIAGNOSIS — C61 Malignant neoplasm of prostate: Secondary | ICD-10-CM | POA: Diagnosis present

## 2019-07-29 DIAGNOSIS — G893 Neoplasm related pain (acute) (chronic): Secondary | ICD-10-CM | POA: Diagnosis not present

## 2019-07-29 DIAGNOSIS — C7951 Secondary malignant neoplasm of bone: Secondary | ICD-10-CM

## 2019-07-29 DIAGNOSIS — E876 Hypokalemia: Secondary | ICD-10-CM

## 2019-07-29 LAB — CMP (CANCER CENTER ONLY)
ALT: 6 U/L (ref 0–44)
AST: 62 U/L — ABNORMAL HIGH (ref 15–41)
Albumin: 2.3 g/dL — ABNORMAL LOW (ref 3.5–5.0)
Alkaline Phosphatase: 610 U/L — ABNORMAL HIGH (ref 38–126)
Anion gap: 9 (ref 5–15)
BUN: 9 mg/dL (ref 8–23)
CO2: 29 mmol/L (ref 22–32)
Calcium: 8.3 mg/dL — ABNORMAL LOW (ref 8.9–10.3)
Chloride: 102 mmol/L (ref 98–111)
Creatinine: 0.54 mg/dL — ABNORMAL LOW (ref 0.61–1.24)
GFR, Est AFR Am: >60 mL/min (ref >60)
GFR, Estimated: >60 mL/min (ref >60)
Glucose, Bld: 107 mg/dL — ABNORMAL HIGH (ref 70–99)
Potassium: 2.8 mmol/L — CL (ref 3.5–5.1)
Sodium: 140 mmol/L (ref 135–145)
Total Bilirubin: 0.5 mg/dL (ref 0.3–1.2)
Total Protein: 6.7 g/dL (ref 6.5–8.1)

## 2019-07-29 LAB — CBC WITH DIFFERENTIAL/PLATELET
Abs Immature Granulocytes: 0.06 10*3/uL (ref 0.00–0.07)
Basophils Absolute: 0 10*3/uL (ref 0.0–0.1)
Basophils Relative: 1 %
Eosinophils Absolute: 0.1 10*3/uL (ref 0.0–0.5)
Eosinophils Relative: 2 %
HCT: 31.2 % — ABNORMAL LOW (ref 39.0–52.0)
Hemoglobin: 9.7 g/dL — ABNORMAL LOW (ref 13.0–17.0)
Immature Granulocytes: 1 %
Lymphocytes Relative: 28 %
Lymphs Abs: 1.7 10*3/uL (ref 0.7–4.0)
MCH: 25.7 pg — ABNORMAL LOW (ref 26.0–34.0)
MCHC: 31.1 g/dL (ref 30.0–36.0)
MCV: 82.8 fL (ref 80.0–100.0)
Monocytes Absolute: 0.4 10*3/uL (ref 0.1–1.0)
Monocytes Relative: 7 %
Neutro Abs: 3.8 10*3/uL (ref 1.7–7.7)
Neutrophils Relative %: 61 %
Platelets: 446 10*3/uL — ABNORMAL HIGH (ref 150–400)
RBC: 3.77 MIL/uL — ABNORMAL LOW (ref 4.22–5.81)
RDW: 17.2 % — ABNORMAL HIGH (ref 11.5–15.5)
WBC: 6.1 10*3/uL (ref 4.0–10.5)
nRBC: 0 % (ref 0.0–0.2)

## 2019-07-29 MED ORDER — ZOLEDRONIC ACID 4 MG/100ML IV SOLN
4.0000 mg | Freq: Once | INTRAVENOUS | Status: AC
  Administered 2019-07-29: 12:00:00 4 mg via INTRAVENOUS

## 2019-07-29 MED ORDER — POTASSIUM CHLORIDE CRYS ER 20 MEQ PO TBCR: 40.0000 meq | EXTENDED_RELEASE_TABLET | Freq: Two times a day (BID) | ORAL | 2 refills | Status: DC

## 2019-07-29 MED ORDER — SODIUM CHLORIDE 0.9 % IV SOLN
Freq: Once | INTRAVENOUS | Status: AC
  Filled 2019-07-29: qty 250

## 2019-07-29 MED ORDER — SENNOSIDES-DOCUSATE SODIUM 8.6-50 MG PO TABS: 2.0000 | ORAL_TABLET | Freq: Every day | ORAL | 1 refills | Status: DC

## 2019-07-29 MED ORDER — ZOLEDRONIC ACID 4 MG/100ML IV SOLN
INTRAVENOUS | Status: AC
  Filled 2019-07-29: qty 100

## 2019-07-29 MED ORDER — FENTANYL 50 MCG/HR TD PT72: 1.0000 | MEDICATED_PATCH | TRANSDERMAL | 0 refills | Status: DC

## 2019-07-29 MED ORDER — OXYCODONE HCL 10 MG PO TABS: 10.0000 mg | ORAL_TABLET | ORAL | 0 refills | Status: DC | PRN

## 2019-07-29 MED ORDER — POTASSIUM CHLORIDE CRYS ER 20 MEQ PO TBCR
40.0000 meq | EXTENDED_RELEASE_TABLET | Freq: Once | ORAL | Status: AC
  Administered 2019-07-29: 12:00:00 40 meq via ORAL

## 2019-07-29 MED ORDER — POTASSIUM CHLORIDE CRYS ER 20 MEQ PO TBCR
EXTENDED_RELEASE_TABLET | ORAL | Status: AC
  Filled 2019-07-29: qty 1

## 2019-07-29 MED ORDER — LEUPROLIDE ACETATE (3 MONTH) 22.5 MG ~~LOC~~ KIT: 22.5000 mg | PACK | Freq: Once | SUBCUTANEOUS | Status: DC

## 2019-07-29 NOTE — Telephone Encounter (Signed)
Please review for refill.  

## 2019-07-29 NOTE — Progress Notes (Signed)
Nutrition Follow-up:  Patient with metastatic prostate cancer.  Patient followed by Dr. Irene Limbo.    Met with patient in infusion.  Patient reports no appetite but feels like it is starting to come back.  Has not been taking marinol but planning to start today.  Patient states that he usually eats raisin bran cereal every other morning for breakfast or has 2 hard boiled eggs.  Drinks about 2 ensure per day.  Reports that his daughter and sister help prepare meals for him.  States that he has access to food.  "I am just not hungry but I know I got to do better."      Medications: marinol  Labs: K 2.8  Anthropometrics:   Weight decreased to 146 lb today from 162 lb on 3/9.   10% weight loss in the last 2 months, significant   NUTRITION DIAGNOSIS: Inadequate oral intake continues   INTERVENTION:  Reviewed ways to increase calories and protein.  Provided handout from [employer], High Calorie, High protein Nutrition Therapy.   Encouraged patient to eat q 2 hours (peanut butter crackers, 1/2 sandwich) Encouraged 350 calorie shakes and samples along with coupons provided.   Patient has contact information    MONITORING, EVALUATION, GOAL: weight trends, intake   NEXT VISIT: June 1 during infusion  Luvern Mischke B. Zenia Resides, [place], [place] Registered Dietitian [phone] (pager)

## 2019-07-29 NOTE — Patient Instructions (Signed)
Zoledronic Acid injection (Hypercalcemia, Oncology) What is this medicine? ZOLEDRONIC ACID (ZOE le dron ik AS id) lowers the amount of calcium loss from bone. It is used to treat too much calcium in your blood from cancer. It is also used to prevent complications of cancer that has spread to the bone. This medicine may be used for other purposes; ask your health care provider or pharmacist if you have questions. COMMON BRAND NAME(S): Zometa What should I tell my health care provider before I take this medicine? They need to know if you have any of these conditions:  aspirin-sensitive asthma  cancer, especially if you are receiving medicines used to treat cancer  dental disease or wear dentures  infection  kidney disease  receiving corticosteroids like dexamethasone or prednisone  an unusual or allergic reaction to zoledronic acid, other medicines, foods, dyes, or preservatives  pregnant or trying to get pregnant  breast-feeding How should I use this medicine? This medicine is for infusion into a vein. It is given by a health care professional in a hospital or clinic setting. Talk to your pediatrician regarding the use of this medicine in children. Special care may be needed. Overdosage: If you think you have taken too much of this medicine contact a poison control center or emergency room at once. NOTE: This medicine is only for you. Do not share this medicine with others. What if I miss a dose? It is important not to miss your dose. Call your doctor or health care professional if you are unable to keep an appointment. What may interact with this medicine?  certain antibiotics given by injection  NSAIDs, medicines for pain and inflammation, like ibuprofen or naproxen  some diuretics like bumetanide, furosemide  teriparatide  thalidomide This list may not describe all possible interactions. Give your health care provider a list of all the medicines, herbs, non-prescription  drugs, or dietary supplements you use. Also tell them if you smoke, drink alcohol, or use illegal drugs. Some items may interact with your medicine. What should I watch for while using this medicine? Visit your doctor or health care professional for regular checkups. It may be some time before you see the benefit from this medicine. Do not stop taking your medicine unless your doctor tells you to. Your doctor may order blood tests or other tests to see how you are doing. Women should inform their doctor if they wish to become pregnant or think they might be pregnant. There is a potential for serious side effects to an unborn child. Talk to your health care professional or pharmacist for more information. You should make sure that you get enough calcium and vitamin D while you are taking this medicine. Discuss the foods you eat and the vitamins you take with your health care professional. Some people who take this medicine have severe bone, joint, and/or muscle pain. This medicine may also increase your risk for jaw problems or a broken thigh bone. Tell your doctor right away if you have severe pain in your jaw, bones, joints, or muscles. Tell your doctor if you have any pain that does not go away or that gets worse. Tell your dentist and dental surgeon that you are taking this medicine. You should not have major dental surgery while on this medicine. See your dentist to have a dental exam and fix any dental problems before starting this medicine. Take good care of your teeth while on this medicine. Make sure you see your dentist for regular follow-up   appointments. What side effects may I notice from receiving this medicine? Side effects that you should report to your doctor or health care professional as soon as possible:  allergic reactions like skin rash, itching or hives, swelling of the face, lips, or tongue  anxiety, confusion, or depression  breathing problems  changes in vision  eye  pain  feeling faint or lightheaded, falls  jaw pain, especially after dental work  mouth sores  muscle cramps, stiffness, or weakness  redness, blistering, peeling or loosening of the skin, including inside the mouth  trouble passing urine or change in the amount of urine Side effects that usually do not require medical attention (report to your doctor or health care professional if they continue or are bothersome):  bone, joint, or muscle pain  constipation  diarrhea  fever  hair loss  irritation at site where injected  loss of appetite  nausea, vomiting  stomach upset  trouble sleeping  trouble swallowing  weak or tired This list may not describe all possible side effects. Call your doctor for medical advice about side effects. You may report side effects to FDA at 1-800-FDA-1088. Where should I keep my medicine? This drug is given in a hospital or clinic and will not be stored at home. NOTE: This sheet is a summary. It may not cover all possible information. If you have questions about this medicine, talk to your doctor, pharmacist, or health care provider.  2020 Elsevier/Gold Standard (2013-08-09 14:19:39)  Coronavirus (COVID-19) Are you at risk?  Are you at risk for the Coronavirus (COVID-19)?  To be considered HIGH RISK for Coronavirus (COVID-19), you have to meet the following criteria:  . Traveled to [place], [place], [place], [place] or [place]; or in the [place] to [place], [place], [place], or [place]; and have fever, cough, and shortness of breath within the last 2 weeks of travel OR . Been in close contact with a person diagnosed with COVID-19 within the last 2 weeks and have fever, cough, and shortness of breath . IF YOU DO NOT MEET THESE CRITERIA, YOU ARE CONSIDERED LOW RISK FOR COVID-19.  What to do if you are HIGH RISK for COVID-19?  . If you are having a medical emergency, call 911. . Seek medical care right away. Before you go  to a doctor's office, urgent care or emergency department, call ahead and tell them about your recent travel, contact with someone diagnosed with COVID-19, and your symptoms. You should receive instructions from your physician's office regarding next steps of care.  . When you arrive at healthcare provider, tell the healthcare staff immediately you have returned from visiting [place], [place], [place], [place] or [place]; or traveled in the [place] to [place], [place], [place], or [place]; in the last two weeks or you have been in close contact with a person diagnosed with COVID-19 in the last 2 weeks.   . Tell the health care staff about your symptoms: fever, cough and shortness of breath. . After you have been seen by a medical provider, you will be either: o Tested for (COVID-19) and discharged home on quarantine except to seek medical care if symptoms worsen, and asked to  - Stay home and avoid contact with others until you get your results (4-5 days)  - Avoid travel on public transportation if possible (such as bus, train, or airplane) or o Sent to the Emergency Department by EMS for evaluation, COVID-19 testing, and possible admission depending   on your condition and test results.  What to do if you are LOW RISK for COVID-19?  Reduce your risk of any infection by using the same precautions used for avoiding the common cold or flu:  . Wash your hands often with soap and warm water for at least 20 seconds.  If soap and water are not readily available, use an alcohol-based hand sanitizer with at least 60% alcohol.  . If coughing or sneezing, cover your mouth and nose by coughing or sneezing into the elbow areas of your shirt or coat, into a tissue or into your sleeve (not your hands). . Avoid shaking hands with others and consider head nods or verbal greetings only. . Avoid touching your eyes, nose, or mouth with unwashed hands.  . Avoid close contact with people who are sick. . Avoid  places or events with large numbers of people in one location, like concerts or sporting events. . Carefully consider travel plans you have or are making. . If you are planning any travel outside or inside the [place], visit the CDC's Travelers' Health webpage for the latest health notices. . If you have some symptoms but not all symptoms, continue to monitor at home and seek medical attention if your symptoms worsen. . If you are having a medical emergency, call 911.   ADDITIONAL HEALTHCARE OPTIONS FOR PATIENTS  [facility] / e-Visit: [email or web address]         [facility]: [phone]  [facility]: [phone]                   [facility]: [phone]   

## 2019-07-29 NOTE — Progress Notes (Signed)
Critical value called to me from lab. Potassium is 2.8 and Inocencio Homes, RN made aware of critical value. She will let Dr. Irene Limbo know. Gardiner Rhyme, RN

## 2019-07-30 ENCOUNTER — Telehealth: Payer: Self-pay | Admitting: *Deleted

## 2019-07-30 LAB — PSA, TOTAL AND FREE
PSA, Free Pct: 12.1 %
PSA, Free: 29.9 ng/mL
Prostate Specific Ag, Serum: 248 ng/mL — ABNORMAL HIGH (ref 0.0–4.0)

## 2019-07-30 NOTE — Telephone Encounter (Signed)
Attempted Prior Authorization through Cover[employer]Meds for [place]: [id number] for Oxycodone 10 mg, [place] [id number] for Fentanyl 50 MCG.  Both request read "Authorization already on file for this request.    [facility] report patient received partial quantities per insurance; paid $0.00 each for seven-day supply.  Received five fentanyl 82mcg/hr patches of fifteen ordered.  Received eighty-four Oxycodone 10 mg tablets of one hundred-twenty ordered.

## 2019-07-31 ENCOUNTER — Telehealth: Payer: Self-pay | Admitting: Hematology

## 2019-07-31 NOTE — Telephone Encounter (Signed)
Scheduled per 05/04 los, patient has been called and notified.

## 2019-08-08 ENCOUNTER — Other Ambulatory Visit: Payer: Self-pay | Admitting: Hematology

## 2019-08-08 ENCOUNTER — Other Ambulatory Visit: Payer: Self-pay

## 2019-08-08 ENCOUNTER — Inpatient Hospital Stay: Payer: Medicare Other | Admitting: Hematology

## 2019-08-08 DIAGNOSIS — C61 Malignant neoplasm of prostate: Secondary | ICD-10-CM

## 2019-08-08 DIAGNOSIS — C7951 Secondary malignant neoplasm of bone: Secondary | ICD-10-CM

## 2019-08-08 DIAGNOSIS — G893 Neoplasm related pain (acute) (chronic): Secondary | ICD-10-CM

## 2019-08-08 MED ORDER — FENTANYL 50 MCG/HR TD PT72: 1.0000 | MEDICATED_PATCH | TRANSDERMAL | 0 refills | Status: DC

## 2019-08-21 ENCOUNTER — Ambulatory Visit (HOSPITAL_COMMUNITY)
Admission: RE | Admit: 2019-08-21 | Discharge: 2019-08-21 | Disposition: A | Discharge status: Home / Self Care | Payer: Medicare Other | Source: Ambulatory Visit | Attending: Hematology | Admitting: Hematology

## 2019-08-21 ENCOUNTER — Other Ambulatory Visit: Payer: Self-pay

## 2019-08-21 DIAGNOSIS — C61 Malignant neoplasm of prostate: Secondary | ICD-10-CM

## 2019-08-21 MED ORDER — AXUMIN (FLUCICLOVINE F 18) INJECTION
10.2000 | Freq: Once | INTRAVENOUS | Status: AC
  Administered 2019-08-21: 10.2 via INTRAVENOUS

## 2019-08-22 ENCOUNTER — Other Ambulatory Visit: Payer: Self-pay | Admitting: Hematology

## 2019-08-22 ENCOUNTER — Other Ambulatory Visit: Payer: Self-pay

## 2019-08-22 MED ORDER — OXYCODONE HCL 10 MG PO TABS: 10.0000 mg | ORAL_TABLET | ORAL | 0 refills | Status: DC | PRN

## 2019-08-22 NOTE — Telephone Encounter (Signed)
TCT patient regard Oxycodone refill sent to pharmacy for pick up, he verbalized understanding.

## 2019-08-22 NOTE — Telephone Encounter (Signed)
Received call from Danley Danker requesting Oxycodone 10 mg tabs be refilled.  Request routed to MD.

## 2019-08-26 ENCOUNTER — Other Ambulatory Visit: Payer: Medicare Other

## 2019-08-26 ENCOUNTER — Inpatient Hospital Stay: Payer: Medicare Other

## 2019-08-26 ENCOUNTER — Other Ambulatory Visit: Payer: Self-pay

## 2019-08-26 ENCOUNTER — Inpatient Hospital Stay (HOSPITAL_BASED_OUTPATIENT_CLINIC_OR_DEPARTMENT_OTHER): Payer: Medicare Other | Admitting: Hematology

## 2019-08-26 ENCOUNTER — Ambulatory Visit: Payer: Medicare Other

## 2019-08-26 ENCOUNTER — Inpatient Hospital Stay: Payer: Medicare Other | Attending: Hematology

## 2019-08-26 VITALS — BP 96/74 | HR 105 | Temp 97.5°F | Resp 18 | Ht 73.0 in | Wt 135.6 lb

## 2019-08-26 VITALS — BP 102/70 | HR 102 | Temp 98.1°F | Resp 16

## 2019-08-26 DIAGNOSIS — C7951 Secondary malignant neoplasm of bone: Secondary | ICD-10-CM

## 2019-08-26 DIAGNOSIS — D72820 Lymphocytosis (symptomatic): Secondary | ICD-10-CM | POA: Insufficient documentation

## 2019-08-26 DIAGNOSIS — Z5189 Encounter for other specified aftercare: Secondary | ICD-10-CM | POA: Insufficient documentation

## 2019-08-26 DIAGNOSIS — R933 Abnormal findings on diagnostic imaging of other parts of digestive tract: Secondary | ICD-10-CM | POA: Diagnosis not present

## 2019-08-26 DIAGNOSIS — Z681 Body mass index (BMI) 19 or less, adult: Secondary | ICD-10-CM | POA: Insufficient documentation

## 2019-08-26 DIAGNOSIS — Z5111 Encounter for antineoplastic chemotherapy: Secondary | ICD-10-CM | POA: Diagnosis not present

## 2019-08-26 DIAGNOSIS — R634 Abnormal weight loss: Secondary | ICD-10-CM | POA: Insufficient documentation

## 2019-08-26 DIAGNOSIS — E876 Hypokalemia: Secondary | ICD-10-CM | POA: Insufficient documentation

## 2019-08-26 DIAGNOSIS — M25512 Pain in left shoulder: Secondary | ICD-10-CM | POA: Diagnosis not present

## 2019-08-26 DIAGNOSIS — C61 Malignant neoplasm of prostate: Secondary | ICD-10-CM

## 2019-08-26 DIAGNOSIS — R748 Abnormal levels of other serum enzymes: Secondary | ICD-10-CM | POA: Insufficient documentation

## 2019-08-26 DIAGNOSIS — G893 Neoplasm related pain (acute) (chronic): Secondary | ICD-10-CM

## 2019-08-26 DIAGNOSIS — E559 Vitamin D deficiency, unspecified: Secondary | ICD-10-CM | POA: Insufficient documentation

## 2019-08-26 DIAGNOSIS — R630 Anorexia: Secondary | ICD-10-CM | POA: Diagnosis not present

## 2019-08-26 DIAGNOSIS — D6481 Anemia due to antineoplastic chemotherapy: Secondary | ICD-10-CM | POA: Insufficient documentation

## 2019-08-26 DIAGNOSIS — Z7189 Other specified counseling: Secondary | ICD-10-CM

## 2019-08-26 DIAGNOSIS — Z87891 Personal history of nicotine dependence: Secondary | ICD-10-CM | POA: Diagnosis not present

## 2019-08-26 LAB — CMP (CANCER CENTER ONLY)
ALT: 6 U/L (ref 0–44)
AST: 73 U/L — ABNORMAL HIGH (ref 15–41)
Albumin: 2.4 g/dL — ABNORMAL LOW (ref 3.5–5.0)
Alkaline Phosphatase: 1433 U/L — ABNORMAL HIGH (ref 38–126)
Anion gap: 10 (ref 5–15)
BUN: 8 mg/dL (ref 8–23)
CO2: 25 mmol/L (ref 22–32)
Calcium: 8.2 mg/dL — ABNORMAL LOW (ref 8.9–10.3)
Chloride: 104 mmol/L (ref 98–111)
Creatinine: 0.54 mg/dL — ABNORMAL LOW (ref 0.61–1.24)
GFR, Est AFR Am: >60 mL/min (ref >60)
GFR, Estimated: >60 mL/min (ref >60)
Glucose, Bld: 100 mg/dL — ABNORMAL HIGH (ref 70–99)
Potassium: 3.5 mmol/L (ref 3.5–5.1)
Sodium: 139 mmol/L (ref 135–145)
Total Bilirubin: 0.7 mg/dL (ref 0.3–1.2)
Total Protein: 6.5 g/dL (ref 6.5–8.1)

## 2019-08-26 LAB — CBC WITH DIFFERENTIAL/PLATELET
Abs Immature Granulocytes: 0.05 10*3/uL (ref 0.00–0.07)
Basophils Absolute: 0 10*3/uL (ref 0.0–0.1)
Basophils Relative: 1 %
Eosinophils Absolute: 0 10*3/uL (ref 0.0–0.5)
Eosinophils Relative: 0 %
HCT: 32.8 % — ABNORMAL LOW (ref 39.0–52.0)
Hemoglobin: 9.8 g/dL — ABNORMAL LOW (ref 13.0–17.0)
Immature Granulocytes: 1 %
Lymphocytes Relative: 30 %
Lymphs Abs: 1.7 10*3/uL (ref 0.7–4.0)
MCH: 25.5 pg — ABNORMAL LOW (ref 26.0–34.0)
MCHC: 29.9 g/dL — ABNORMAL LOW (ref 30.0–36.0)
MCV: 85.2 fL (ref 80.0–100.0)
Monocytes Absolute: 0.3 10*3/uL (ref 0.1–1.0)
Monocytes Relative: 6 %
Neutro Abs: 3.5 10*3/uL (ref 1.7–7.7)
Neutrophils Relative %: 62 %
Platelets: 369 10*3/uL (ref 150–400)
RBC: 3.85 MIL/uL — ABNORMAL LOW (ref 4.22–5.81)
RDW: 19.3 % — ABNORMAL HIGH (ref 11.5–15.5)
WBC: 5.7 10*3/uL (ref 4.0–10.5)
nRBC: 0 % (ref 0.0–0.2)

## 2019-08-26 MED ORDER — OXYCODONE HCL 10 MG PO TABS: 10.0000 mg | ORAL_TABLET | ORAL | 0 refills | Status: DC | PRN

## 2019-08-26 MED ORDER — SODIUM CHLORIDE 0.9 % IV SOLN
INTRAVENOUS | Status: DC
  Filled 2019-08-26: qty 250

## 2019-08-26 MED ORDER — ZOLEDRONIC ACID 4 MG/100ML IV SOLN
INTRAVENOUS | Status: AC
  Filled 2019-08-26: qty 100

## 2019-08-26 MED ORDER — POLYETHYLENE GLYCOL 3350 17 G PO PACK: 17.0000 g | PACK | Freq: Every day | ORAL | 1 refills | Status: AC

## 2019-08-26 MED ORDER — ZOLEDRONIC ACID 4 MG/100ML IV SOLN
4.0000 mg | Freq: Once | INTRAVENOUS | Status: AC
  Administered 2019-08-26: 4 mg via INTRAVENOUS

## 2019-08-26 MED ORDER — DRONABINOL 5 MG PO CAPS: 5.0000 mg | ORAL_CAPSULE | Freq: Two times a day (BID) | ORAL | 0 refills | Status: AC

## 2019-08-26 MED ORDER — SENNOSIDES-DOCUSATE SODIUM 8.6-50 MG PO TABS: 2.0000 | ORAL_TABLET | Freq: Two times a day (BID) | ORAL | 1 refills | Status: AC

## 2019-08-26 NOTE — Progress Notes (Signed)
HEMATOLOGY/ONCOLOGY CLINIC NOTE  Date of Service: 08/26/19   Patient Care Team: [name], NP as PCP - General (Internal Medicine)  CHIEF COMPLAINTS/PURPOSE OF CONSULTATION:   F/u for continue mx of metastatic prostate cancer  HISTORY OF PRESENTING ILLNESS:  Eric Osborn. is a wonderful 74 y.o. male who has been referred to [place] from the [facility] long hospital ED by [name] PA-C for evaluation and management of metastatic malignancy concerning for metastatic prostate cancer.  Patient has had a h/o locally advanced prostate cancer diagnosed in 2014 and had a radical prostatectomy and pelvic LN dissection by Dr [name] in 10/2012. Pathology showed pT3b pN0 disease (with positive margins, extraprostatic extension, seminal vesicle involvement and angiolymphatic invasion). Bone scan was negative for metastatic disease. Patient report no post-op RT. He notes that he received lupron shots as per his urologist for 1 year post-operatively. He was being monitored by Dr [name] and last had clinical evaluation and PSA about 61month ago - PSA level not available in our system. He notes that he was lost to f/u since then.  Patient presented to the ED with worsening lower and middle back pain for 2 weeks. Also notes about 15-20lbs weight loss and anorexia over the last 6-8weeks. He also notes hematuria. No fevers/chills. Patient had a CT abd/pelvis in the ED which showed Innumerable sclerotic lesions in all imaged bones consistent metastatic disease. The prostate gland has an irregular appearance and the patient may have prostate carcinoma. A few enlarged retroperitoneal lymph nodes are identified worrisome for additional foci of metastatic disease.  Patient's pain was somewhat controlled with prn percocet in ED and he was discharged home with f/u with [place] Patient notes that the patient wakes him up in the night.  No urinary retention. No loss of bowel or bladder control. No new  neurological symptoms in his extremities.   INTERVAL HISTORY   Eric Osborn is here to follow up of his metastatic prostatic cancer. We are joined today by his friend. The patient's last visit with [place] on 07/29/2019. The pt reports that he is doing well overall.  The pt reports that most of his discomfort is along his right shoulder blade and his lower right ribs. He denies any leg pain. Pt feels that his pain is currently a 6 out of 10. He has not been eating well due to a lack of appetite. Pt has been unable to take in much nutrition outside of Ensure supplements. Pt has only been having 1-2 bowel movements per week, which is much less than his baseline, and has not been using stool softeners or laxatives.   Of note since the patient's last visit, pt has had PET (Axumin) ([id number] completed on 08/21/2019 with results revealing "1. Progression prostate cancer skeletal metastasis. Dominant progression is continued exuberant periosteal reaction with extraosseous extension noted in the LEFT and RIGHT iliac fossa, posterior RIGHT ninth rib and RIGHT shoulder girdle. This soft tissue extraosseous extension adjacent to the periosteal reaction has intense radiotracer activity 2. Persistent multifocal skeletal metastasis elsewhere in the axillary and appendicular skeleton. 3. No evidence prostate cancer nodal metastasis. 4. No evidence of liver metastasis or pulmonary metastasis."  Lab results today (08/26/19) of CBC w/diff and CMP is as follows: all values are WNL except for RBC at 3.85, Hgb at 9.8, HCT at 32.8, MCH at 25.5, MCHC at 29.9, RDW at 19.3, Glucose at 100, Creatinine at 0.54, Calcium at 8.2, Albumin at 2.4,  AST at 73, ALP at 1433.  On review of systems, pt reports right shoulder pain, right rib pain, weight loss, low appetite, constipation and denies abdominal pain, leg pain and any other symptoms.   MEDICAL HISTORY:   Past Medical History:  Diagnosis Date  . Arthritis    left  knee, left shoulder  . Erectile dysfunction 06/25/2012  . Foley catheter in place   . Goiter 06/25/2012  . H/O hiatal hernia   . Hematuria    occasional  . Hemorrhoid   . History of bladder infections   . Joint pain   . Prostate cancer [facility])     SURGICAL HISTORY: Past Surgical History:  Procedure Laterality Date  . INGUINAL HERNIA REPAIR  8 yrs ago  . LYMPHADENECTOMY Bilateral 11/20/2012   Procedure: WITH BILATERAL PELVIC LYMPH NODE DISSECTION ;  Surgeon: [name], MD;  Location: [facility] ORS;  Service: Urology;  Laterality: Bilateral;  . ROBOT ASSISTED LAPAROSCOPIC RADICAL PROSTATECTOMY N/A 11/20/2012   Procedure: ROBOTIC ASSISTED LAPAROSCOPIC RADICAL PROSTATECTOMY;  Surgeon: [name], MD;  Location: [facility];  Service: Urology;  Laterality: N/A;    SOCIAL HISTORY: Social History   Socioeconomic History  . Marital status: Divorced    Spouse name: Not on file  . Number of children: 3  . Years of education: 9  . Highest education level: Not on file  Occupational History  . Occupation: Retired    Comment: textile  Tobacco Use  . Smoking status: Former Smoker    Packs/day: 0.25    Years: 25.00    Pack years: 6.25    Types: Cigarettes    Quit date: 03/27/2014    Years since quitting: 5.4  . Smokeless tobacco: Former Systems developer  . Tobacco comment: quit date 2016  Substance and Sexual Activity  . Alcohol use: Not Currently    Comment: occasional beer   . Drug use: Yes    Types: Marijuana    Comment: 3 x a month uses marijuana  . Sexual activity: Not Currently  Other Topics Concern  . Not on file  Social History Narrative   Regular exercise-no   Caffeine Use-yes   Social Determinants of Health   Financial Resource Strain:   . Difficulty of Paying Living Expenses:   Food Insecurity:   . Worried About Charity fundraiser in the Last Year:   . Arboriculturist in the Last Year:   Transportation Needs:   . Film/video editor (Medical):   [name] Lack of Transportation  (Non-Medical):   Physical Activity:   . Days of Exercise per Week:   . Minutes of Exercise per Session:   Stress:   . Feeling of Stress :   Social Connections:   . Frequency of Communication with Friends and Family:   . Frequency of Social Gatherings with Friends and Family:   . Attends Religious Services:   . Active Member of Clubs or Organizations:   . Attends Archivist Meetings:   [name] Marital Status:   Intimate Partner Violence:   . Fear of Current or Ex-Partner:   . Emotionally Abused:   [name] Physically Abused:   . Sexually Abused:     FAMILY HISTORY: Family History  Problem Relation Age of Onset  . Heart disease Sister   . Stroke Sister   . Colon cancer Paternal Uncle   . Cancer Daughter        unknown  . Prostate cancer Paternal Uncle   .  Diabetes Neg Hx   . Breast cancer Neg Hx     ALLERGIES:  is allergic to heparin and poison ivy extract [poison ivy extract].  MEDICATIONS:  Current Outpatient Medications  Medication Sig Dispense Refill  . dronabinol (MARINOL) 5 MG capsule Take 1 capsule (5 mg total) by mouth 2 (two) times daily before a meal. 60 capsule 0  . fentaNYL (DURAGESIC) 50 MCG/HR Place 1 patch onto the skin every other day. 15 patch 0  . leuprolide (LUPRON) 22.5 MG injection Inject 22.5 mg into the muscle every 3 (three) months.    . lidocaine (LIDODERM) 5 % Place 1 patch onto the skin daily. Apply to area of maximal pain over lower back. Remove & Discard patch within 12 hours or as directed by MD 30 patch 0  . mupirocin ointment (BACTROBAN) 2 % Apply 1 application topically 4 (four) times daily. 30 g 0  . [START ON 09/02/2019] Oxycodone HCl 10 MG TABS Take 1-2 tablets (10-20 mg total) by mouth every 4 (four) hours as needed (severe cancer related pain - Metastatic prostate cancer). 120 tablet 0  . polyethylene glycol (MIRALAX) 17 g packet Take 17 g by mouth daily. 30 each 1  . potassium chloride SA (KLOR-CON) 20 MEQ tablet Take 2 tablets (40 mEq  total) by mouth 2 (two) times daily. 120 tablet 2  . predniSONE (DELTASONE) 5 MG tablet TAKE 1 TABLET BY MOUTH ONCE DAILY WITH BREAKFAST 30 tablet 2  . senna-docusate (SENNA S) 8.6-50 MG tablet Take 2 tablets by mouth 2 (two) times daily. 120 tablet 1  . Vitamin D, Ergocalciferol, (DRISDOL) 1.25 MG (50000 UNIT) CAPS capsule Take 1 capsule (50,000 Units total) by mouth 2 (two) times a week. 24 capsule 6   Current Facility-Administered Medications  Medication Dose Route Frequency Provider Last Rate Last Admin  . morphine 4 MG/ML injection 2 mg  2 mg Intramuscular Once Bruning, Ashlyn, PA-C        REVIEW OF SYSTEMS:   A 10+ POINT REVIEW OF SYSTEMS WAS OBTAINED including neurology, dermatology, psychiatry, cardiac, respiratory, lymph, extremities, GI, GU, Musculoskeletal, constitutional, breasts, reproductive, HEENT.  All pertinent positives are noted in the HPI.  All others are negative.   PHYSICAL EXAMINATION: ECOG FS:1 - Symptomatic but completely ambulatory  Vitals:   08/26/19 1336  BP: 96/74  Pulse: (!) 105  Resp: 18  Temp: (!) 97.5 F (36.4 C)  SpO2: 98%   Wt Readings from Last 3 Encounters:  08/26/19 135 lb 9.6 oz (61.5 kg)  07/29/19 146 lb 1.6 oz (66.3 kg)  06/03/19 162 lb 11.2 oz (73.8 kg)   Body mass index is 17.89 kg/m.    GENERAL:alert, in no acute distress and comfortable SKIN: no acute rashes, no significant lesions EYES: conjunctiva are pink and non-injected, sclera anicteric OROPHARYNX: MMM, no exudates, no oropharyngeal erythema or ulceration NECK: supple, no JVD LYMPH:  no palpable lymphadenopathy in the cervical, axillary or inguinal regions LUNGS: clear to auscultation b/l with normal respiratory effort HEART: regular rate & rhythm ABDOMEN:  normoactive bowel sounds , non tender, not distended. No palpable hepatosplenomegaly.  Extremity: no pedal edema PSYCH: alert & oriented x 3 with fluent speech NEURO: no focal motor/sensory deficits  LABORATORY DATA:    I have reviewed the data as listed  . CBC Latest Ref Rng & Units 08/26/2019 07/29/2019 07/01/2019  WBC 4.0 - 10.5 K/uL 5.7 6.1 7.4  Hemoglobin 13.0 - 17.0 g/dL 9.8(L) 9.7(L) 10.6(L)  Hematocrit 39.0 - 52.0 %  32.8(L) 31.2(L) 34.7(L)  Platelets 150 - 400 K/uL 369 446(H) 330    CMP Latest Ref Rng & Units 08/26/2019 07/29/2019 07/01/2019  Glucose 70 - 99 mg/dL 100(H) 107(H) 98  BUN 8 - 23 mg/dL 8 9 6(L)  Creatinine 0.61 - 1.24 mg/dL 0.54(L) 0.54(L) 0.52(L)  Sodium 135 - 145 mmol/L 139 140 141  Potassium 3.5 - 5.1 mmol/L 3.5 2.8(LL) 3.2(L)  Chloride 98 - 111 mmol/L 104 102 104  CO2 22 - 32 mmol/L '25 29 28  '$ Calcium 8.9 - 10.3 mg/dL 8.2(L) 8.3(L) 8.3(L)  Total Protein 6.5 - 8.1 g/dL 6.5 6.7 6.4(L)  Total Bilirubin 0.3 - 1.2 mg/dL 0.7 0.5 0.4  Alkaline Phos 38 - 126 U/L 1,433(H) 610(H) 747(H)  AST 15 - 41 U/L 73(H) 62(H) 48(H)  ALT 0 - 44 U/L 6 6 <6   .   08/14/2017 Peripheral Blood Flow Cytometry           RADIOGRAPHIC STUDIES: I have personally reviewed the radiological images as listed and agreed with the findings in the report. NM PET (AXUMIN) SKULL BASE TO MID THIGH  Result Date: 08/22/2019 CLINICAL DATA:  Prostate carcinoma with biochemical recurrence. PSA equal 248. EXAM: NUCLEAR MEDICINE PET SKULL BASE TO THIGH TECHNIQUE: 10.2 mCi F-18 Fluciclovine was injected intravenously. Full-ring PET imaging was performed from the skull base to thigh after the radiotracer. CT data was obtained and used for attenuation correction and anatomic localization. COMPARISON:  Axumin PET 01/30/2019, MRI lumbar spine 04/10/2019 FINDINGS: NECK No radiotracer activity in neck lymph nodes. Incidental CT finding: Enlargement of the LEFT thyroid gland to 4.6 cm not changed. CHEST No suspicious pulmonary nodules. No significant radiotracer accumulation in axillary lymph nodes or mediastinal lymph nodes. Incidental CT finding: None ABDOMEN/PELVIS Prostate: No focal activity in the prostate bed. Lymph nodes: No  abnormal radiotracer accumulation within pelvic or abdominal nodes. Liver: No evidence of liver metastasis Incidental CT finding: High-density lesion in the RIGHT kidney measuring 11 mm is unchanged. Several low-density lesions upper RIGHT kidney unchanged. SKELETON There is again demonstrated progression of skeletal metastasis. Exuberant periosteal reaction noted within the pelvis, shoulder girdles and ribs. The periosteal reaction continues to increase with extraosseous extension. Clear example of the extraosseous periosteal reaction is notable in the RIGHT ninth rib with there is periosteal reaction soft tissue thickening into the pleural space with a soft tissue measuring 12 mm (image 105/4 )where previously there was no soft tissue thickening. The bone itself is expanded to 15 mm previously the bone measured 7 mm. This lesion has intense radiotracer activity particularly along the soft tissue extraosseous portion with SUV max equal 4.9. Similar findings in the RIGHT iliac wing where there is intense radiotracer activity associated the extraosseous periosteal reaction SUV max equal 5.4. This evident on comparison MRI January 2021. Soft tissue portion in the iliac fossa measures 3.1 cm (image 166/4) with SUV max equal 5.4 increased from 1.5 cm. There is new extraosseous extension in the LEFT iliac fossa measuring 1.4 cm with SUV max equal 4.7. Elsewhere there is scattered intense radiotracer activity within the axillary appendicular skeleton not changed. Periosteal reaction in the expansile LEFT shoulder is similar to the pelvis. IMPRESSION: 1. Progression prostate cancer skeletal metastasis. Dominant progression is continued exuberant periosteal reaction with extraosseous extension noted in the LEFT and RIGHT iliac fossa, posterior RIGHT ninth rib and RIGHT shoulder girdle. This soft tissue extraosseous extension adjacent to the periosteal reaction has intense radiotracer activity 2. Persistent multifocal  skeletal metastasis  elsewhere in the axillary and appendicular skeleton. 3. No evidence prostate cancer nodal metastasis. 4. No evidence of liver metastasis or pulmonary metastasis. Electronically Signed   By: Suzy Bouchard M.D.   On: 08/22/2019 10:52    ASSESSMENT & PLAN:   74 y.o. AAM with previous h/o of locally advanced pT3b,pN0 prostate cancer now with concern for newly noted metastatic prostate cancer.  1) Metastatic Prostate Cancer  PSA level 550 pre-treatment and have improved significantly and were down to 0.6. Now gradually rising again and if upt o  49.7  without any other associated new symptoms. Patient had been noted to have extensive osseous metastatic disease throughout the spine and bilaterally in the calvarium as well. Also noted to have local recurrence in the prostatic bed, retroperitoneal Lnadenoapathy and some mediastinal LNadenoapathy.  ECOG PS 1-2 Previously locally advanced pT3b,pN0 diagnosed in 10/2012 s/p radical prostatectomy and ADT x 1 yr. Testosterone level 7 -- is dropping appropriately with treatment. S/p 6 cycles of taxotere.  12/25/17 PET/CT revealed Solitary retroperitoneal lymph node with radiotracer activity adjacent the IVC most consistent with prostate cancer metastasis. 2. Distant nodal metastasis in the LEFT and RIGHT axilla. Not typical location for prostate cancer metastasis but the activity is intense and favor such. 3. New periosteal reaction within the RIGHT iliac bone and LEFT scapula with intense radiotracer activity consistent active skeletal metastasis. Additional active metastasis in the RIGHT mandible and proximal RIGHT femur.   04/10/2019 MR SACRUM SI JOINTS W WO CONTRAST (Accession [id number]) revealed "1. Epidural tumor anteriorly in the sacral spinal canal at the S2 level measuring 2.7 by 1.0 by 2.6 cm, with mass effect on the S2 nerve roots and below due to effacement of the sacral spinal canal. Minimal extension into the medial S2 neural  foramina. 2. Extensive multifocal metastatic involvement in the lower lumbar spine, bony pelvis, and right proximal femur similar to that noted previously, with expansile lesions in the right iliac bone and right ischium. 3. Extraosseous extension of tumor adjacent to the right iliac bone primarily along the deep margin of the iloipsoas, but also posteriorly adjacent to the gluteal musculature. 4. Nonspecific low-grade edema in the piriformis musculature, right greater than left, possibly neurogenic."  2) s/p Anterior mandibular pain  Orthopantogram done - showed IMPRESSION: Residual teeth 23-26 with decay.  No mandibular osseous lesion seen Patient has completed dental extractions and is using dentures and  3) Elevated alkaline phosphatase level due to extensive bone mets.   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 Stable. -would anticipate mild anemia from ADT  5) Hematuria due to local recurrence of prostate cancer - 1 episode of hematuria - now resolved  6) Anorexia and weight loss due to progressive prostate cancer Patient notes he's been eating much better. . Wt Readings from Last 3 Encounters:  08/26/19 135 lb 9.6 oz (61.5 kg)  07/29/19 146 lb 1.6 oz (66.3 kg)  06/03/19 162 lb 11.2 oz (73.8 kg)    7) Vit D deficiency with hypocalcemia -- levels are normalizing. -Being aggressively replaced since his calcium levels have dropped after Xgeva likely reflecting significant bone calcium deficits from extensive healing bone lesions. -Continue ergocalciferol 50,000U weekly  -Continue Vit D daily    8) Neoplasm related pain - much improved patient is has not needed much of his prescribed pain medications Plan  -Continue Fentanyl patch 78mg/h and prn Oxycodone as per orders (reasonable control) -Senna S and Miralax for bowel prophylaxis -Rx prostate cancer  9) Colonic  thickening in transverse colon thought to be related to spasm/incomplete distension of colon in this  area. -would recommend pursuing colonoscopy with PCP --pending  10) Left shoulder pain Patient has left shoulder pain s/p injury 3-4 months ago.  -XR left shoulder today- reviewed results - No fracture or dislocation is noted in the left shoulder. Sclerosis of scapula is noted consistent with metastatic disease.  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 Progression prostate cancer skeletal metastasis. Dominant progression is continued exuberant periosteal reaction with extraosseous extension noted in the LEFT and RIGHT iliac fossa, posterior RIGHT ninth rib and RIGHT shoulder girdle. This soft tissue extraosseous extension adjacent to the periosteal reaction has intense radiotracer activity 2. Persistent multifocal skeletal metastasis elsewhere in the axillary and appendicular skeleton. 3. No evidence prostate cancer nodal metastasis. 4. No evidence of liver metastasis or pulmonary metastasis." -Advised pt that although his progression is in only in the bone, there are several areas.  -Advised pt that our next course of treatment would be chemotherapy to help control his rapidly progressive and high volume prostate cancer. -He has progressed on Docetaxel/Enzalutamide and Abiraterone at this time. --Would proceed with Cabazitaxel once every 3 weeks with G-CSF support.. Plan to begin in 1 week with dose reduction to '15mg'$ /m2 considering patients nutritional status.  -Advised pt of the increased risk of infection with chemotherapy and other adverse effect. Pt would still like to proceed with this option.  -Advised pt that he is losing weight due to cancer progression, low testosterone, and low appetite. Beginning treatments to knock back his cancer and  minimizing constipation with laxatives should help with this.  -Will repeat CT guided biopsy to evaluate for other target molecular mutations such as: MMR status and HRR mutation to determine other treatment options down the line -Will also refer to genetic counseling for BRCA mutation studies.  -Advised pt to continue Zytiga 1000 mg PO daily until 1-2 days prior to first Cabazitaxel infusion -Continue Zometa q4weeks and Lupron q12weeks -Rx Laxative -Refill Oxycodone -Will see back in 2 weeks for a toxicity check   FOLLOW UP: Plz schedule for labs and to start chemotherapy with Cabazitaxel in 1 week MD visit with labs in 2 weeks for toxicity check    The total time spent in the appt was 30 minutes and more than 50% was on counseling and direct patient cares.  All of the patient's questions were answered with apparent satisfaction. The patient knows to call the clinic with any problems, questions or concerns.  Sullivan Lone MD [place] AAHIVMS [facility] Hematology/Oncology Physician [facility]  (Office):       [phone] (Work cell):  [phone] (Fax):           [phone]  I, Yevette Edwards, am acting as a scribe for Dr. Sullivan Lone.   .I have reviewed the above documentation for accuracy and completeness, and I agree with the above. [name] MD

## 2019-08-26 NOTE — Progress Notes (Signed)
DISCONTINUE ON PATHWAY REGIMEN - Prostate   Docetaxel 75 mg/m2:   A cycle is every 21 days:     Docetaxel        Dose Mod: None  **Always confirm dose/schedule in [facility] + Bicalutamide:   A cycle is every 12 weeks:     Leuprolide acetate depot        Dose Mod: None   Daily:     Bicalutamide        Dose Mod: None  **Always confirm dose/schedule in your pharmacy ordering system**  REASON: Disease Progression PRIOR TREATMENT: POS77: Docetaxel 75 mg/m2 q21 Days Without Prednisone x 6 Cycles with Medical Castration TREATMENT RESPONSE: Partial Response (PR)  START ON PATHWAY REGIMEN - Prostate     A cycle is every 21 days.:     Cabazitaxel      Prednisone   **Always confirm dose/schedule in your pharmacy ordering system**  Patient Characteristics: Adenocarcinoma, Recurrent/New Systemic Disease, Castration Resistant, M1, Prior Novel Hormonal Agent, No Molecular Alteration or Targeted Therapy Exhausted, Prior Docetaxel/Docetaxel Not Indicated Histology: Adenocarcinoma Therapeutic Status: Recurrent/New Systemic Disease  Intent of Therapy: Non-Curative / Palliative Intent, Discussed with Patient

## 2019-08-26 NOTE — Progress Notes (Addendum)
Nutrition Follow-up:   Patient with metastatic prostate cancer.  Patient followed by Dr. Irene Limbo  Met with patient in infusion.  Patient reports appetite is a 5 out of 10.  Eating potato chips during visit.  Reports that he has been drinking 1 ensure in the AM and 1 in the PM.  Eats oatmeal or frosted flakes of hard boiled egg for breakfast.  Lunch is chicken noodle soup.  Supper maybe shrimp fried rice that daughter pick up.  Reports bowels move about 2-3 times per week.      Medications:marinol  Labs: reviewed  Anthropometrics:   No new weight today.     NUTRITION DIAGNOSIS: Inadequate oral intake continues   INTERVENTION:  Discussed ways to add calories and protein to oatmeal and chicken noodle soup.   Recommend increase ensure/boost PLUS to TID Provided complimentary case of ensure enlive.   Patient interested in drinking oral nutrition supplement while in infusion so RD was able to provide one for him.       MONITORING, EVALUATION, GOAL: weight trends, intake   NEXT VISIT: June 29 during infusion  Eric Osborn B. Zenia Resides, [place], [place] Registered Dietitian [phone] (pager)

## 2019-08-26 NOTE — Progress Notes (Signed)
Dr. Irene Limbo reviewed today's labs and advises ok to proceed with Zometa infusion.

## 2019-08-26 NOTE — Patient Instructions (Signed)
Zoledronic Acid injection (Hypercalcemia, Oncology) What is this medicine? ZOLEDRONIC ACID (ZOE le dron ik AS id) lowers the amount of calcium loss from bone. It is used to treat too much calcium in your blood from cancer. It is also used to prevent complications of cancer that has spread to the bone. This medicine may be used for other purposes; ask your health care provider or pharmacist if you have questions. COMMON BRAND NAME(S): Zometa What should I tell my health care provider before I take this medicine? They need to know if you have any of these conditions:  aspirin-sensitive asthma  cancer, especially if you are receiving medicines used to treat cancer  dental disease or wear dentures  infection  kidney disease  receiving corticosteroids like dexamethasone or prednisone  an unusual or allergic reaction to zoledronic acid, other medicines, foods, dyes, or preservatives  pregnant or trying to get pregnant  breast-feeding How should I use this medicine? This medicine is for infusion into a vein. It is given by a health care professional in a hospital or clinic setting. Talk to your pediatrician regarding the use of this medicine in children. Special care may be needed. Overdosage: If you think you have taken too much of this medicine contact a poison control center or emergency room at once. NOTE: This medicine is only for you. Do not share this medicine with others. What if I miss a dose? It is important not to miss your dose. Call your doctor or health care professional if you are unable to keep an appointment. What may interact with this medicine?  certain antibiotics given by injection  NSAIDs, medicines for pain and inflammation, like ibuprofen or naproxen  some diuretics like bumetanide, furosemide  teriparatide  thalidomide This list may not describe all possible interactions. Give your health care provider a list of all the medicines, herbs, non-prescription  drugs, or dietary supplements you use. Also tell them if you smoke, drink alcohol, or use illegal drugs. Some items may interact with your medicine. What should I watch for while using this medicine? Visit your doctor or health care professional for regular checkups. It may be some time before you see the benefit from this medicine. Do not stop taking your medicine unless your doctor tells you to. Your doctor may order blood tests or other tests to see how you are doing. Women should inform their doctor if they wish to become pregnant or think they might be pregnant. There is a potential for serious side effects to an unborn child. Talk to your health care professional or pharmacist for more information. You should make sure that you get enough calcium and vitamin D while you are taking this medicine. Discuss the foods you eat and the vitamins you take with your health care professional. Some people who take this medicine have severe bone, joint, and/or muscle pain. This medicine may also increase your risk for jaw problems or a broken thigh bone. Tell your doctor right away if you have severe pain in your jaw, bones, joints, or muscles. Tell your doctor if you have any pain that does not go away or that gets worse. Tell your dentist and dental surgeon that you are taking this medicine. You should not have major dental surgery while on this medicine. See your dentist to have a dental exam and fix any dental problems before starting this medicine. Take good care of your teeth while on this medicine. Make sure you see your dentist for regular follow-up   appointments. What side effects may I notice from receiving this medicine? Side effects that you should report to your doctor or health care professional as soon as possible:  allergic reactions like skin rash, itching or hives, swelling of the face, lips, or tongue  anxiety, confusion, or depression  breathing problems  changes in vision  eye  pain  feeling faint or lightheaded, falls  jaw pain, especially after dental work  mouth sores  muscle cramps, stiffness, or weakness  redness, blistering, peeling or loosening of the skin, including inside the mouth  trouble passing urine or change in the amount of urine Side effects that usually do not require medical attention (report to your doctor or health care professional if they continue or are bothersome):  bone, joint, or muscle pain  constipation  diarrhea  fever  hair loss  irritation at site where injected  loss of appetite  nausea, vomiting  stomach upset  trouble sleeping  trouble swallowing  weak or tired This list may not describe all possible side effects. Call your doctor for medical advice about side effects. You may report side effects to FDA at 1-800-FDA-1088. Where should I keep my medicine? This drug is given in a hospital or clinic and will not be stored at home. NOTE: This sheet is a summary. It may not cover all possible information. If you have questions about this medicine, talk to your doctor, pharmacist, or health care provider.  2020 [employer]/Gold Standard (2013-08-09 14:19:39)  

## 2019-08-26 NOTE — Progress Notes (Signed)
Per Dr. Irene Limbo: okay to proceed with Zometa treatment today. Will see patient in clinic to discuss PET/CT and next treatment options.

## 2019-08-28 ENCOUNTER — Telehealth: Payer: Self-pay | Admitting: Hematology

## 2019-08-28 ENCOUNTER — Telehealth: Payer: Self-pay

## 2019-08-28 NOTE — Telephone Encounter (Signed)
Scheduled per 06/01 los, called patient and spoke with patient's relative about upcoming scheduled appointments. Patient will be notified.

## 2019-08-28 NOTE — Telephone Encounter (Signed)
Received voicemail from patient stating that he needed a refill on his Fentanyl. Sent Dr Irene Limbo a in basket message of patient's refill request.

## 2019-08-28 NOTE — Telephone Encounter (Signed)
Received another TC stating that patient needs a refill on fentanyl medication. Wrote down and gave request to Dr Irene Limbo. Let patients wife know that Dr Irene Limbo is aware of request.

## 2019-08-29 ENCOUNTER — Other Ambulatory Visit: Payer: Self-pay | Admitting: Hematology

## 2019-08-29 ENCOUNTER — Telehealth: Payer: Self-pay | Admitting: Hematology

## 2019-08-29 DIAGNOSIS — C7951 Secondary malignant neoplasm of bone: Secondary | ICD-10-CM

## 2019-08-29 DIAGNOSIS — C61 Malignant neoplasm of prostate: Secondary | ICD-10-CM

## 2019-08-29 DIAGNOSIS — G893 Neoplasm related pain (acute) (chronic): Secondary | ICD-10-CM

## 2019-08-29 MED ORDER — FENTANYL 50 MCG/HR TD PT72: 1.0000 | MEDICATED_PATCH | TRANSDERMAL | 0 refills | Status: DC

## 2019-08-29 NOTE — Telephone Encounter (Signed)
Scheduled per los, patient has been called and notified. 

## 2019-09-01 ENCOUNTER — Inpatient Hospital Stay: Payer: Medicare Other

## 2019-09-01 ENCOUNTER — Other Ambulatory Visit: Payer: Self-pay

## 2019-09-01 VITALS — BP 107/74 | HR 88 | Temp 98.4°F | Resp 18 | Wt 135.5 lb

## 2019-09-01 DIAGNOSIS — C7951 Secondary malignant neoplasm of bone: Secondary | ICD-10-CM

## 2019-09-01 DIAGNOSIS — Z5111 Encounter for antineoplastic chemotherapy: Secondary | ICD-10-CM | POA: Diagnosis not present

## 2019-09-01 DIAGNOSIS — Z7189 Other specified counseling: Secondary | ICD-10-CM

## 2019-09-01 DIAGNOSIS — G893 Neoplasm related pain (acute) (chronic): Secondary | ICD-10-CM

## 2019-09-01 DIAGNOSIS — C61 Malignant neoplasm of prostate: Secondary | ICD-10-CM

## 2019-09-01 LAB — CMP (CANCER CENTER ONLY)
ALT: 6 U/L (ref 0–44)
AST: 69 U/L — ABNORMAL HIGH (ref 15–41)
Albumin: 2.2 g/dL — ABNORMAL LOW (ref 3.5–5.0)
Alkaline Phosphatase: 1072 U/L — ABNORMAL HIGH (ref 38–126)
Anion gap: 13 (ref 5–15)
BUN: 8 mg/dL (ref 8–23)
CO2: 25 mmol/L (ref 22–32)
Calcium: 8.1 mg/dL — ABNORMAL LOW (ref 8.9–10.3)
Chloride: 102 mmol/L (ref 98–111)
Creatinine: 0.5 mg/dL — ABNORMAL LOW (ref 0.61–1.24)
GFR, Est AFR Am: >60 mL/min (ref >60)
GFR, Estimated: >60 mL/min (ref >60)
Glucose, Bld: 91 mg/dL (ref 70–99)
Potassium: 3.2 mmol/L — ABNORMAL LOW (ref 3.5–5.1)
Sodium: 140 mmol/L (ref 135–145)
Total Bilirubin: 0.5 mg/dL (ref 0.3–1.2)
Total Protein: 6.2 g/dL — ABNORMAL LOW (ref 6.5–8.1)

## 2019-09-01 LAB — CBC WITH DIFFERENTIAL/PLATELET
Abs Immature Granulocytes: 0.03 10*3/uL (ref 0.00–0.07)
Basophils Absolute: 0.1 10*3/uL (ref 0.0–0.1)
Basophils Relative: 1 %
Eosinophils Absolute: 0.1 10*3/uL (ref 0.0–0.5)
Eosinophils Relative: 1 %
HCT: 31.2 % — ABNORMAL LOW (ref 39.0–52.0)
Hemoglobin: 9.4 g/dL — ABNORMAL LOW (ref 13.0–17.0)
Immature Granulocytes: 1 %
Lymphocytes Relative: 33 %
Lymphs Abs: 1.6 10*3/uL (ref 0.7–4.0)
MCH: 25.5 pg — ABNORMAL LOW (ref 26.0–34.0)
MCHC: 30.1 g/dL (ref 30.0–36.0)
MCV: 84.6 fL (ref 80.0–100.0)
Monocytes Absolute: 0.4 10*3/uL (ref 0.1–1.0)
Monocytes Relative: 8 %
Neutro Abs: 2.8 10*3/uL (ref 1.7–7.7)
Neutrophils Relative %: 56 %
Platelets: 503 10*3/uL — ABNORMAL HIGH (ref 150–400)
RBC: 3.69 MIL/uL — ABNORMAL LOW (ref 4.22–5.81)
RDW: 18.7 % — ABNORMAL HIGH (ref 11.5–15.5)
WBC: 5 10*3/uL (ref 4.0–10.5)
nRBC: 0 % (ref 0.0–0.2)

## 2019-09-01 MED ORDER — DIPHENHYDRAMINE HCL 50 MG/ML IJ SOLN
INTRAMUSCULAR | Status: AC
  Filled 2019-09-01: qty 1

## 2019-09-01 MED ORDER — FAMOTIDINE IN NACL 20-0.9 MG/50ML-% IV SOLN
INTRAVENOUS | Status: AC
  Filled 2019-09-01: qty 50

## 2019-09-01 MED ORDER — SODIUM CHLORIDE 0.9 % IV SOLN
Freq: Once | INTRAVENOUS | Status: AC
  Filled 2019-09-01: qty 250

## 2019-09-01 MED ORDER — DIPHENHYDRAMINE HCL 50 MG/ML IJ SOLN
25.0000 mg | Freq: Once | INTRAMUSCULAR | Status: AC
  Administered 2019-09-01: 25 mg via INTRAVENOUS

## 2019-09-01 MED ORDER — SODIUM CHLORIDE 0.9 % IV SOLN
10.0000 mg | Freq: Once | INTRAVENOUS | Status: AC
  Administered 2019-09-01: 10 mg via INTRAVENOUS
  Filled 2019-09-01: qty 10

## 2019-09-01 MED ORDER — SODIUM CHLORIDE 0.9 % IV SOLN
15.0000 mg/m2 | Freq: Once | INTRAVENOUS | Status: AC
  Administered 2019-09-01: 27 mg via INTRAVENOUS
  Filled 2019-09-01: qty 2.7

## 2019-09-01 MED ORDER — FAMOTIDINE IN NACL 20-0.9 MG/50ML-% IV SOLN
20.0000 mg | Freq: Once | INTRAVENOUS | Status: AC
  Administered 2019-09-01: 20 mg via INTRAVENOUS

## 2019-09-01 NOTE — Patient Instructions (Signed)
[facility] Discharge Instructions for Patients Receiving Chemotherapy  Today you received the following chemotherapy agents: cabazitaxel.  To help prevent nausea and vomiting after your treatment, we encourage you to take your nausea medication as directed.   If you develop nausea and vomiting that is not controlled by your nausea medication, call the clinic.   BELOW ARE SYMPTOMS THAT SHOULD BE REPORTED IMMEDIATELY:  *FEVER GREATER THAN 100.5 F  *CHILLS WITH OR WITHOUT FEVER  NAUSEA AND VOMITING THAT IS NOT CONTROLLED WITH YOUR NAUSEA MEDICATION  *UNUSUAL SHORTNESS OF BREATH  *UNUSUAL BRUISING OR BLEEDING  TENDERNESS IN MOUTH AND THROAT WITH OR WITHOUT PRESENCE OF ULCERS  *URINARY PROBLEMS  *BOWEL PROBLEMS  UNUSUAL RASH Items with * indicate a potential emergency and should be followed up as soon as possible.  Feel free to call the clinic should you have any questions or concerns. The clinic phone number is [phone].  Please show the [facility] at check-in to the Emergency Department and triage nurse.  Cabazitaxel injection What is this medicine? CABAZITAXEL (ka baz i TAX el) is a chemotherapy drug. It is used to treat prostate cancer. It targets fast dividing cells, like cancer cells, and causes these cells to die. This medicine may be used for other purposes; ask your health care provider or pharmacist if you have questions. COMMON BRAND NAME(S): Jevtana What should I tell my health care provider before I take this medicine? They need to know if you have any of these conditions:  history of stomach bleeding  kidney disease  liver disease  low blood counts, like low white cell, platelet, or red cell counts  lung or breathing disease, like asthma  recent or ongoing radiation therapy  take medicines that treat or prevent blood clots  an unusual or allergic reaction to cabazitaxel, polysorbate 80, other medicines, foods, dyes, or  preservatives  pregnant or trying to get pregnant  breast-feeding How should I use this medicine? This medicine is for infusion into a vein. It is given by a health care professional in a hospital or clinic setting. Talk to your pediatrician regarding the use of this medicine in children. Special care may be needed. Overdosage: If you think you have taken too much of this medicine contact a poison control center or emergency room at once. NOTE: This medicine is only for you. Do not share this medicine with others. What if I miss a dose? It is important not to miss your dose. Call your doctor or health care professional if you are unable to keep an appointment. What may interact with this medicine?  antiviral medicines for HIV or AIDS  clarithromycin  medicines for fungal infections like ketoconazole, fluconazole, itraconazole, and voriconazole  nefazodone  telithromycin This list may not describe all possible interactions. Give your health care provider a list of all the medicines, herbs, non-prescription drugs, or dietary supplements you use. Also tell them if you smoke, drink alcohol, or use illegal drugs. Some items may interact with your medicine. What should I watch for while using this medicine? Your condition will be monitored carefully while you are receiving this medicine. This drug may make you feel generally unwell. This is not uncommon, as chemotherapy can affect healthy cells as well as cancer cells. Report any side effects. Continue your course of treatment even though you feel ill unless your doctor tells you to stop. Call your doctor or health care professional for advice if you get a fever, chills or  sore throat, or other symptoms of a cold or flu. Do not treat yourself. This drug decreases your body's ability to fight infections. Try to avoid being around people who are sick. This medicine may increase your risk to bruise or bleed. Call your doctor or health care  professional if you notice any unusual bleeding. Be careful brushing and flossing your teeth or using a toothpick because you may get an infection or bleed more easily. If you have any dental work done, tell your dentist you are receiving this medicine. Avoid taking products that contain aspirin, acetaminophen, ibuprofen, naproxen, or ketoprofen unless instructed by your doctor. These medicines may hide a fever. Do not become pregnant while taking this medicine. Women should inform their doctor if they wish to become pregnant or think they might be pregnant. Men should not father a child while taking this medicine and for 3 months after stopping it. There is a potential for serious side effects to an unborn child. Talk to your health care professional or pharmacist for more information. Do not breast-feed an infant while taking this medicine. What side effects may I notice from receiving this medicine? Side effects that you should report to your doctor or health care professional as soon as possible:  allergic reactions like skin rash, itching or hives, swelling of the face, lips, or tongue  blood in the urine  breathing problems  constipation  dark urine  diarrhea  pain in the lower back or side  pain, tingling, numbness in the hands or feet  pain when urinating  severe abdominal pain  signs of infection - fever or chills, cough, sore throat, pain or difficulty passing urine  signs and symptoms of kidney injury like trouble passing urine or change in the amount of urine  signs of decreased platelets or bleeding - bruising, pinpoint red spots on the skin, black, tarry stools, blood in the urine  signs of decreased red blood cells - unusually weak or tired, fainting spells, lightheadedness  vomiting Side effects that usually do not require medical attention (report to your doctor or health care professional if they continue or are bothersome):  back pain  change in  taste  hair loss  headache  loss of appetite  muscle or joint pain  nausea  upset stomach This list may not describe all possible side effects. Call your doctor for medical advice about side effects. You may report side effects to FDA at 1-800-FDA-1088. Where should I keep my medicine? This drug is given in a hospital or clinic and will not be stored at home. NOTE: This sheet is a summary. It may not cover all possible information. If you have questions about this medicine, talk to your doctor, pharmacist, or health care provider.  2020 [employer]/Gold Standard (2016-04-07 15:38:47)

## 2019-09-02 LAB — PSA, TOTAL AND FREE
PSA, Free Pct: >10.5 %
PSA, Free: >50 ng/mL
Prostate Specific Ag, Serum: 474 ng/mL — ABNORMAL HIGH (ref 0.0–4.0)

## 2019-09-03 ENCOUNTER — Inpatient Hospital Stay: Payer: Medicare Other

## 2019-09-03 ENCOUNTER — Other Ambulatory Visit: Payer: Self-pay

## 2019-09-03 VITALS — BP 110/72 | HR 75 | Resp 18

## 2019-09-03 DIAGNOSIS — C7951 Secondary malignant neoplasm of bone: Secondary | ICD-10-CM

## 2019-09-03 DIAGNOSIS — Z5111 Encounter for antineoplastic chemotherapy: Secondary | ICD-10-CM | POA: Diagnosis not present

## 2019-09-03 DIAGNOSIS — Z7189 Other specified counseling: Secondary | ICD-10-CM

## 2019-09-03 DIAGNOSIS — C61 Malignant neoplasm of prostate: Secondary | ICD-10-CM

## 2019-09-03 MED ORDER — LEUPROLIDE ACETATE (3 MONTH) 22.5 MG ~~LOC~~ KIT
22.5000 mg | PACK | Freq: Once | SUBCUTANEOUS | Status: AC
  Administered 2019-09-03: 22.5 mg via SUBCUTANEOUS

## 2019-09-03 NOTE — Patient Instructions (Signed)

## 2019-09-04 ENCOUNTER — Other Ambulatory Visit: Payer: Self-pay | Admitting: *Deleted

## 2019-09-04 MED ORDER — OXYCODONE HCL 10 MG PO TABS: 10.0000 mg | ORAL_TABLET | ORAL | 0 refills | Status: DC | PRN

## 2019-09-04 NOTE — Telephone Encounter (Signed)
Friend/caregiver Beckey Downing called. Patient will need refill of Oxycodone by weekend. Request sent to Dr. Irene Limbo

## 2019-09-10 ENCOUNTER — Inpatient Hospital Stay: Payer: Medicare Other

## 2019-09-10 ENCOUNTER — Other Ambulatory Visit: Payer: Self-pay

## 2019-09-10 ENCOUNTER — Inpatient Hospital Stay (HOSPITAL_BASED_OUTPATIENT_CLINIC_OR_DEPARTMENT_OTHER): Payer: Medicare Other | Admitting: Hematology

## 2019-09-10 ENCOUNTER — Other Ambulatory Visit: Payer: Self-pay | Admitting: *Deleted

## 2019-09-10 ENCOUNTER — Encounter: Payer: Self-pay | Admitting: *Deleted

## 2019-09-10 VITALS — BP 112/82 | HR 103 | Temp 97.9°F | Resp 18 | Ht 73.0 in | Wt 133.5 lb

## 2019-09-10 DIAGNOSIS — E876 Hypokalemia: Secondary | ICD-10-CM

## 2019-09-10 DIAGNOSIS — Z5111 Encounter for antineoplastic chemotherapy: Secondary | ICD-10-CM | POA: Diagnosis not present

## 2019-09-10 DIAGNOSIS — G893 Neoplasm related pain (acute) (chronic): Secondary | ICD-10-CM

## 2019-09-10 DIAGNOSIS — Z7189 Other specified counseling: Secondary | ICD-10-CM

## 2019-09-10 DIAGNOSIS — E860 Dehydration: Secondary | ICD-10-CM

## 2019-09-10 DIAGNOSIS — C61 Malignant neoplasm of prostate: Secondary | ICD-10-CM

## 2019-09-10 DIAGNOSIS — E43 Unspecified severe protein-calorie malnutrition: Secondary | ICD-10-CM

## 2019-09-10 DIAGNOSIS — C7951 Secondary malignant neoplasm of bone: Secondary | ICD-10-CM

## 2019-09-10 LAB — CBC WITH DIFFERENTIAL/PLATELET
Abs Immature Granulocytes: 0.07 10*3/uL (ref 0.00–0.07)
Basophils Absolute: 0 10*3/uL (ref 0.0–0.1)
Basophils Relative: 1 %
Eosinophils Absolute: 0.1 10*3/uL (ref 0.0–0.5)
Eosinophils Relative: 1 %
HCT: 32.7 % — ABNORMAL LOW (ref 39.0–52.0)
Hemoglobin: 9.8 g/dL — ABNORMAL LOW (ref 13.0–17.0)
Immature Granulocytes: 1 %
Lymphocytes Relative: 40 %
Lymphs Abs: 2.1 10*3/uL (ref 0.7–4.0)
MCH: 25.6 pg — ABNORMAL LOW (ref 26.0–34.0)
MCHC: 30 g/dL (ref 30.0–36.0)
MCV: 85.4 fL (ref 80.0–100.0)
Monocytes Absolute: 0.2 10*3/uL (ref 0.1–1.0)
Monocytes Relative: 4 %
Neutro Abs: 2.8 10*3/uL (ref 1.7–7.7)
Neutrophils Relative %: 53 %
Platelets: 362 10*3/uL (ref 150–400)
RBC: 3.83 MIL/uL — ABNORMAL LOW (ref 4.22–5.81)
RDW: 20 % — ABNORMAL HIGH (ref 11.5–15.5)
WBC: 5.3 10*3/uL (ref 4.0–10.5)
nRBC: 0.4 % — ABNORMAL HIGH (ref 0.0–0.2)

## 2019-09-10 LAB — CMP (CANCER CENTER ONLY)
ALT: <6 U/L (ref 0–44)
AST: 106 U/L — ABNORMAL HIGH (ref 15–41)
Albumin: 2.5 g/dL — ABNORMAL LOW (ref 3.5–5.0)
Alkaline Phosphatase: 1332 U/L — ABNORMAL HIGH (ref 38–126)
Anion gap: 9 (ref 5–15)
BUN: 10 mg/dL (ref 8–23)
CO2: 32 mmol/L (ref 22–32)
Calcium: 8.3 mg/dL — ABNORMAL LOW (ref 8.9–10.3)
Chloride: 100 mmol/L (ref 98–111)
Creatinine: 0.57 mg/dL — ABNORMAL LOW (ref 0.61–1.24)
GFR, Est AFR Am: >60 mL/min (ref >60)
GFR, Estimated: >60 mL/min (ref >60)
Glucose, Bld: 116 mg/dL — ABNORMAL HIGH (ref 70–99)
Potassium: 2.8 mmol/L — CL (ref 3.5–5.1)
Sodium: 141 mmol/L (ref 135–145)
Total Bilirubin: 0.3 mg/dL (ref 0.3–1.2)
Total Protein: 5.9 g/dL — ABNORMAL LOW (ref 6.5–8.1)

## 2019-09-10 MED ORDER — SODIUM CHLORIDE 0.9 % IV SOLN
Freq: Once | INTRAVENOUS | Status: AC
  Filled 2019-09-10: qty 1000

## 2019-09-10 MED ORDER — POTASSIUM CHLORIDE CRYS ER 20 MEQ PO TBCR
40.0000 meq | EXTENDED_RELEASE_TABLET | Freq: Once | ORAL | Status: AC
  Administered 2019-09-10: 40 meq via ORAL

## 2019-09-10 MED ORDER — PEGFILGRASTIM-CBQV 6 MG/0.6ML ~~LOC~~ SOSY
6.0000 mg | PREFILLED_SYRINGE | Freq: Once | SUBCUTANEOUS | Status: AC
  Administered 2019-09-10: 6 mg via SUBCUTANEOUS

## 2019-09-10 MED ORDER — PEGFILGRASTIM-CBQV 6 MG/0.6ML ~~LOC~~ SOSY
PREFILLED_SYRINGE | SUBCUTANEOUS | Status: AC
  Filled 2019-09-10: qty 0.6

## 2019-09-10 MED ORDER — SODIUM CHLORIDE 0.9 % IV SOLN
INTRAVENOUS | Status: AC
  Filled 2019-09-10: qty 250

## 2019-09-10 MED ORDER — POTASSIUM CHLORIDE CRYS ER 20 MEQ PO TBCR
EXTENDED_RELEASE_TABLET | ORAL | Status: AC
  Filled 2019-09-10: qty 2

## 2019-09-10 NOTE — Progress Notes (Signed)
CRITICAL VALUE ALERT  Critical Value:  Potassium 2.8  Date & Time Notied:  09/10/19 at 1015  Provider Notified: Dr. Irene Limbo  Orders Received/Actions taken: MD notified and verbalized understanding.

## 2019-09-10 NOTE — Progress Notes (Signed)
1019: Informed by Roney Mans., RN, that patient potassium is 2.8. 1025: Dr. Irene Limbo informed

## 2019-09-10 NOTE — Progress Notes (Signed)
HEMATOLOGY/ONCOLOGY CLINIC NOTE  Date of Service: 09/10/19   Patient Care Team: [name], NP as PCP - General (Internal Medicine)  CHIEF COMPLAINTS/PURPOSE OF CONSULTATION:   F/u for continue mx of metastatic prostate cancer  HISTORY OF PRESENTING ILLNESS:  Eric Osborn. is a wonderful 74 y.o. male who has been referred to [place] from the [facility] ED by [name] PA-C for evaluation and management of metastatic malignancy concerning for metastatic prostate cancer.  Patient has had a h/o locally advanced prostate cancer diagnosed in 2014 and had a radical prostatectomy and pelvic LN dissection by Dr [name] in 10/2012. Pathology showed pT3b pN0 disease (with positive margins, extraprostatic extension, seminal vesicle involvement and angiolymphatic invasion). Bone scan was negative for metastatic disease. Patient report no post-op RT. He notes that he received lupron shots as per his urologist for 1 year post-operatively. He was being monitored by Dr [name] and last had clinical evaluation and PSA about 29month ago - PSA level not available in our system. He notes that he was lost to f/u since then.  Patient presented to the ED with worsening lower and middle back pain for 2 weeks. Also notes about 15-20lbs weight loss and anorexia over the last 6-8weeks. He also notes hematuria. No fevers/chills. Patient had a CT abd/pelvis in the ED which showed Innumerable sclerotic lesions in all imaged bones consistent metastatic disease. The prostate gland has an irregular appearance and the patient may have prostate carcinoma. A few enlarged retroperitoneal lymph nodes are identified worrisome for additional foci of metastatic disease.  Patient's pain was somewhat controlled with prn percocet in ED and he was discharged home with f/u with [place] Patient notes that the patient wakes him up in the night.  No urinary retention. No loss of bowel or bladder control. No new  neurological symptoms in his extremities.   INTERVAL HISTORY:  Eric Osborn is here to follow up of his metastatic prostatic cancer. We are joined today by his friend, SFreda Munro He is here for a toxicity check after C1 of Cabazitaxel. The patient's last visit with [place] on 08/26/2019. The pt reports that he is doing well overall.  The pt reports that his mouth feels dry and that his throat closes up as he tries to eat. He has sufficient appetite but is having difficulty getting the food down. Pt has also been experiencing significant back pain that is non-localized. Directly after his first chemotherapy treatment he had loose, runny stools about three times per day.   Lab results today (09/10/19) of CBC w/diff and CMP is as follows: all values are WNL except for RBC at 3.83, Hgb at 9.8, HCT at 32.7, MCH at 25.6, RDW at 20.0, nRBC at 0.4, Potassium at 2.8, Glucose at 116, Creatinine at 0.57, Calcium at 8.3, Total Protein at 5.9, Albumin at 2.5, AST at 106, ALP at 1332.  On review of systems, pt reports diarrhea, back pain, dry mouth, difficulty swallowing, weight loss and denies constipation, low appetite and any other symptoms.   MEDICAL HISTORY:   Past Medical History:  Diagnosis Date  . Arthritis    left knee, left shoulder  . Erectile dysfunction 06/25/2012  . Foley catheter in place   . Goiter 06/25/2012  . H/O hiatal hernia   . Hematuria    occasional  . Hemorrhoid   . History of bladder infections   . Joint pain   . [place]  SURGICAL HISTORY: Past Surgical History:  Procedure Laterality Date  . INGUINAL HERNIA REPAIR  8 yrs ago  . LYMPHADENECTOMY Bilateral 11/20/2012   Procedure: WITH BILATERAL PELVIC LYMPH NODE DISSECTION ;  Surgeon: [name], MD;  Location: [facility];  Service: Urology;  Laterality: Bilateral;  . ROBOT ASSISTED LAPAROSCOPIC RADICAL PROSTATECTOMY N/A 11/20/2012   Procedure: ROBOTIC ASSISTED LAPAROSCOPIC RADICAL PROSTATECTOMY;  Surgeon:  [name], MD;  Location: [facility];  Service: Urology;  Laterality: N/A;    SOCIAL HISTORY: Social History   Socioeconomic History  . Marital status: Divorced    Spouse name: Not on file  . Number of children: 3  . Years of education: 9  . Highest education level: Not on file  Occupational History  . Occupation: Retired    Comment: textile  Tobacco Use  . Smoking status: Former Smoker    Packs/day: 0.25    Years: 25.00    Pack years: 6.25    Types: Cigarettes    Quit date: 03/27/2014    Years since quitting: 5.4  . Smokeless tobacco: Former Systems developer  . Tobacco comment: quit date 2016  Vaping Use  . Vaping Use: Never used  Substance and Sexual Activity  . Alcohol use: Not Currently    Comment: occasional beer   . Drug use: Yes    Types: Marijuana    Comment: 3 x a month uses marijuana  . Sexual activity: Not Currently  Other Topics Concern  . Not on file  Social History Narrative   Regular exercise-no   Caffeine Use-yes   Social Determinants of Health   Financial Resource Strain:   . Difficulty of Paying Living Expenses:   Food Insecurity:   . Worried About Charity fundraiser in the Last Year:   . Arboriculturist in the Last Year:   Transportation Needs:   . Film/video editor (Medical):   [name] Lack of Transportation (Non-Medical):   Physical Activity:   . Days of Exercise per Week:   . Minutes of Exercise per Session:   Stress:   . Feeling of Stress :   Social Connections:   . Frequency of Communication with Friends and Family:   . Frequency of Social Gatherings with Friends and Family:   . Attends Religious Services:   . Active Member of Clubs or Organizations:   . Attends Archivist Meetings:   [name] Marital Status:   Intimate Partner Violence:   . Fear of Current or Ex-Partner:   . Emotionally Abused:   [name] Physically Abused:   . Sexually Abused:     FAMILY HISTORY: Family History  Problem Relation Age of Onset  . Heart disease Sister   .  Stroke Sister   . Colon cancer Paternal Uncle   . Cancer Daughter        unknown  . Prostate cancer Paternal Uncle   . Diabetes Neg Hx   . Breast cancer Neg Hx     ALLERGIES:  is allergic to heparin and poison ivy extract [poison ivy extract].  MEDICATIONS:  Current Outpatient Medications  Medication Sig Dispense Refill  . dronabinol (MARINOL) 5 MG capsule Take 1 capsule (5 mg total) by mouth 2 (two) times daily before a meal. 60 capsule 0  . fentaNYL (DURAGESIC) 50 MCG/HR Place 1 patch onto the skin every other day. 10 patch 0  . leuprolide (LUPRON) 22.5 MG injection Inject 22.5 mg into the muscle every 3 (three) months.    [name]  lidocaine (LIDODERM) 5 % Place 1 patch onto the skin daily. Apply to area of maximal pain over lower back. Remove & Discard patch within 12 hours or as directed by MD 30 patch 0  . mupirocin ointment (BACTROBAN) 2 % Apply 1 application topically 4 (four) times daily. 30 g 0  . Oxycodone HCl 10 MG TABS Take 1-2 tablets (10-20 mg total) by mouth every 4 (four) hours as needed (severe cancer related pain - Metastatic prostate cancer). 120 tablet 0  . polyethylene glycol (MIRALAX) 17 g packet Take 17 g by mouth daily. 30 each 1  . potassium chloride SA (KLOR-CON) 20 MEQ tablet Take 2 tablets (40 mEq total) by mouth 2 (two) times daily. 120 tablet 2  . predniSONE (DELTASONE) 5 MG tablet TAKE 1 TABLET BY MOUTH ONCE DAILY WITH BREAKFAST 30 tablet 2  . senna-docusate (SENNA S) 8.6-50 MG tablet Take 2 tablets by mouth 2 (two) times daily. 120 tablet 1  . Vitamin D, Ergocalciferol, (DRISDOL) 1.25 MG (50000 UNIT) CAPS capsule Take 1 capsule (50,000 Units total) by mouth 2 (two) times a week. 24 capsule 6   Current Facility-Administered Medications  Medication Dose Route Frequency Provider Last Rate Last Admin  . 0.9 %  sodium chloride infusion   Intravenous Continuous [name], MD      . morphine 4 MG/ML injection 2 mg  2 mg Intramuscular Once Bruning, Ashlyn,  PA-C        REVIEW OF SYSTEMS:   A 10+ POINT REVIEW OF SYSTEMS WAS OBTAINED including neurology, dermatology, psychiatry, cardiac, respiratory, lymph, extremities, GI, GU, Musculoskeletal, constitutional, breasts, reproductive, HEENT.  All pertinent positives are noted in the HPI.  All others are negative.   PHYSICAL EXAMINATION: ECOG FS:1 - Symptomatic but completely ambulatory  Vitals:   09/10/19 1035  BP: 112/82  Pulse: (!) 103  Resp: 18  Temp: 97.9 F (36.6 C)  SpO2: 98%   Wt Readings from Last 3 Encounters:  09/10/19 133 lb 8 oz (60.6 kg)  09/01/19 135 lb 8 oz (61.5 kg)  08/26/19 135 lb 9.6 oz (61.5 kg)   Body mass index is 17.61 kg/m.    GENERAL:alert, in no acute distress and comfortable SKIN: no acute rashes, no significant lesions EYES: conjunctiva are pink and non-injected, sclera anicteric OROPHARYNX: MMM, no exudates, no oropharyngeal erythema or ulceration NECK: supple, no JVD LYMPH:  no palpable lymphadenopathy in the cervical, axillary or inguinal regions LUNGS: clear to auscultation b/l with normal respiratory effort HEART: regular rate & rhythm ABDOMEN:  normoactive bowel sounds , non tender, not distended. No palpable hepatosplenomegaly.  Extremity: no pedal edema PSYCH: alert & oriented x 3 with fluent speech NEURO: no focal motor/sensory deficits  LABORATORY DATA:  I have reviewed the data as listed  . CBC Latest Ref Rng & Units 09/10/2019 09/01/2019 08/26/2019  WBC 4.0 - 10.5 K/uL 5.3 5.0 5.7  Hemoglobin 13.0 - 17.0 g/dL 9.8(L) 9.4(L) 9.8(L)  Hematocrit 39 - 52 % 32.7(L) 31.2(L) 32.8(L)  Platelets 150 - 400 K/uL 362 503(H) 369    CMP Latest Ref Rng & Units 09/10/2019 09/01/2019 08/26/2019  Glucose 70 - 99 mg/dL 116(H) 91 100(H)  BUN 8 - 23 mg/dL _0 Creatinine 0.61 - 1.24 mg/dL 0.57(L) 0.50(L) 0.54(L)  Sodium 135 - 145 mmol/L 141 140 139  Potassium 3.5 - 5.1 mmol/L 2.8(LL) 3.2(L) 3.5  Chloride 98 - 111 mmol/L 100 102 104  CO2 22 - 32 mmol/L 32  25 25  Calcium 8.9 - 10.3 mg/dL 8.3(L) 8.1(L) 8.2(L)  Total Protein 6.5 - 8.1 g/dL 5.9(L) 6.2(L) 6.5  Total Bilirubin 0.3 - 1.2 mg/dL 0.3 0.5 0.7  Alkaline Phos 38 - 126 U/L 1,332(H) 1,072(H) 1,433(H)  AST 15 - 41 U/L 106(H) 69(H) 73(H)  ALT 0 - 44 U/L <_0 .   08/14/2017 Peripheral Blood Flow Cytometry           RADIOGRAPHIC STUDIES: I have personally reviewed the radiological images as listed and agreed with the findings in the report. NM PET (AXUMIN) SKULL BASE TO MID THIGH  Result Date: 08/22/2019 CLINICAL DATA:  Prostate carcinoma with biochemical recurrence. PSA equal 248. EXAM: NUCLEAR MEDICINE PET SKULL BASE TO THIGH TECHNIQUE: 10.2 mCi F-18 Fluciclovine was injected intravenously. Full-ring PET imaging was performed from the skull base to thigh after the radiotracer. CT data was obtained and used for attenuation correction and anatomic localization. COMPARISON:  Ax[facility] PET 01/30/2019, MRI lumbar spine 04/10/2019 FINDINGS: NECK No radiotracer activity in neck lymph nodes. Incidental CT finding: Enlargement of the LEFT thyroid gland to 4.6 cm not changed. CHEST No suspicious pulmonary nodules. No significant radiotracer accumulation in axillary lymph nodes or mediastinal lymph nodes. Incidental CT finding: None ABDOMEN/PELVIS Prostate: No focal activity in the prostate bed. Lymph nodes: No abnormal radiotracer accumulation within pelvic or abdominal nodes. Liver: No evidence of liver metastasis Incidental CT finding: High-density lesion in the RIGHT kidney measuring 11 mm is unchanged. Several low-density lesions upper RIGHT kidney unchanged. SKELETON There is again demonstrated progression of skeletal metastasis. Exuberant periosteal reaction noted within the pelvis, shoulder girdles and ribs. The periosteal reaction continues to increase with extraosseous extension. Clear example of the extraosseous periosteal reaction is notable in the RIGHT ninth rib with there is periosteal  reaction soft tissue thickening into the pleural space with a soft tissue measuring 12 mm (image 105/4 )where previously there was no soft tissue thickening. The bone itself is expanded to 15 mm previously the bone measured 7 mm. This lesion has intense radiotracer activity particularly along the soft tissue extraosseous portion with SUV max equal 4.9. Similar findings in the RIGHT iliac wing where there is intense radiotracer activity associated the extraosseous periosteal reaction SUV max equal 5.4. This evident on comparison MRI January 2021. Soft tissue portion in the iliac fossa measures 3.1 cm (image 166/4) with SUV max equal 5.4 increased from 1.5 cm. There is new extraosseous extension in the LEFT iliac fossa measuring 1.4 cm with SUV max equal 4.7. Elsewhere there is scattered intense radiotracer activity within the axillary appendicular skeleton not changed. Periosteal reaction in the expansile LEFT shoulder is similar to the pelvis. IMPRESSION: 1. Progression prostate cancer skeletal metastasis. Dominant progression is continued exuberant periosteal reaction with extraosseous extension noted in the LEFT and RIGHT iliac fossa, posterior RIGHT ninth rib and RIGHT shoulder girdle. This soft tissue extraosseous extension adjacent to the periosteal reaction has intense radiotracer activity 2. Persistent multifocal skeletal metastasis elsewhere in the axillary and appendicular skeleton. 3. No evidence prostate cancer nodal metastasis. 4. No evidence of liver metastasis or pulmonary metastasis. Electronically Signed   By: Suzy Bouchard M.D.   On: 08/22/2019 10:52    ASSESSMENT & PLAN:   74 y.o. AAM with previous h/o of locally advanced pT3b,pN0 prostate cancer now with concern for newly noted metastatic prostate cancer.  1) Metastatic Prostate Cancer  PSA level 550 pre-treatment and have improved significantly and were down to 0.6. Now gradually rising again  and if upt o  49.7  without any other  associated new symptoms. Patient had been noted to have extensive osseous metastatic disease throughout the spine and bilaterally in the calvarium as well. Also noted to have local recurrence in the prostatic bed, retroperitoneal Lnadenoapathy and some mediastinal LNadenoapathy.  ECOG PS 1-2 Previously locally advanced pT3b,pN0 diagnosed in 10/2012 s/p radical prostatectomy and ADT x 1 yr. Testosterone level 7 -- is dropping appropriately with treatment. S/p 6 cycles of taxotere.  12/25/17 PET/CT revealed Solitary retroperitoneal lymph node with radiotracer activity adjacent the IVC most consistent with prostate cancer metastasis. 2. Distant nodal metastasis in the LEFT and RIGHT axilla. Not typical location for prostate cancer metastasis but the activity is intense and favor such. 3. New periosteal reaction within the RIGHT iliac bone and LEFT scapula with intense radiotracer activity consistent active skeletal metastasis. Additional active metastasis in the RIGHT mandible and proximal RIGHT femur.   04/10/2019 MR SACRUM SI JOINTS W WO CONTRAST (Accession [id number]) revealed "1. Epidural tumor anteriorly in the sacral spinal canal at the S2 level measuring 2.7 by 1.0 by 2.6 cm, with mass effect on the S2 nerve roots and below due to effacement of the sacral spinal canal. Minimal extension into the medial S2 neural foramina. 2. Extensive multifocal metastatic involvement in the lower lumbar spine, bony pelvis, and right proximal femur similar to that noted previously, with expansile lesions in the right iliac bone and right ischium. 3. Extraosseous extension of tumor adjacent to the right iliac bone primarily along the deep margin of the iloipsoas, but also posteriorly adjacent to the gluteal musculature. 4. Nonspecific low-grade edema in the piriformis musculature, right greater than left, possibly neurogenic."  08/21/2019 PET (Ax[facility]) ([id number]) revealed "1. Progression prostate cancer skeletal  metastasis. Dominant progression is continued exuberant periosteal reaction with extraosseous extension noted in the LEFT and RIGHT iliac fossa, posterior RIGHT ninth rib and RIGHT shoulder girdle. This soft tissue extraosseous extension adjacent to the periosteal reaction has intense radiotracer activity 2. Persistent multifocal skeletal metastasis elsewhere in the axillary and appendicular skeleton. 3. No evidence prostate cancer nodal metastasis. 4. No evidence of liver metastasis or pulmonary metastasis."  2) s/p Anterior mandibular pain  Orthopantogram done - showed IMPRESSION: Residual teeth 23-26 with decay.  No mandibular osseous lesion seen Patient has completed dental extractions and is using dentures and  3) Elevated alkaline phosphatase level due to extensive bone mets.   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 Stable. -would anticipate mild anemia from ADT  5) Hematuria due to local recurrence of prostate cancer - 1 episode of hematuria - now resolved  6) Anorexia and weight loss due to progressive prostate cancer Patient notes he's been eating much better. . Wt Readings from Last 3 Encounters:  09/10/19 133 lb 8 oz (60.6 kg)  09/01/19 135 lb 8 oz (61.5 kg)  08/26/19 135 lb 9.6 oz (61.5 kg)    7) Vit D deficiency with hypocalcemia -- levels are normalizing. -Being aggressively replaced since his calcium levels have dropped after Xgeva likely reflecting significant bone calcium deficits from extensive healing bone lesions. -Continue ergocalciferol 50,000U weekly  -Continue Vit D daily    8) Neoplasm related pain - much improved patient is has not needed much of his prescribed pain medications Plan  -Continue Fentanyl patch 27mg/h and prn Oxycodone as per orders (reasonable control) -Senna S and Miralax for bowel prophylaxis -Rx prostate cancer  9) Colonic thickening in transverse colon thought to be related  to spasm/incomplete distension of colon in  this area. -would recommend pursuing colonoscopy with PCP --pending  10) Left shoulder pain Patient has left shoulder pain s/p injury 3-4 months ago.  -XR left shoulder today- reviewed results - No fracture or dislocation is noted in the left shoulder. Sclerosis of scapula is noted consistent with metastatic disease.  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 May consider switching pt to another pain management medication, as he may not have enough fat for the Fentanyl Patch to absorb properly.  -Recommend hydrate well, especially while eating meals. Could also use butter or olive oil to assist with dry mouth.  -Unsure if pt can sustain himself during treatment. He has continued to lose weight and has difficulty eating. He is still able to move about and use the restroom by himself.  -Discussed switching pt to comfort cares if he is unable to continue treatment - pt would prefer to continue with treatment at this time -Offered pt the option of a gastrostomy as he is having trouble sustaining himself orally - pt okay with this option -Will repeat CT guided biopsy to evaluate for other target molecular mutations such as: MMR status and HRR mutation to determine other treatment options down the line  -Will discuss options for treating localized pain with Radiation Oncology  -Continue Zometa q4weeks and Lupron q12weeks -Will give fluids/Potassium today  -continue po potassium 58mq po BID -Will see  back prior to C2   FOLLOW UP: IR for G tube placement CT guided biopsy of bone lesion in scapula of other metastatic lesion RTC as per scheduled appointment for C2 of Cabazitaxel    The total time spent in the appt was 40 minutes and more than 50% was on counseling and direct patient cares.  All of the patient's questions were answered with apparent satisfaction. The patient knows to call the clinic with any problems, questions or concerns.   GSullivan LoneMD MHoldenvilleAAHIVMS SWilkes Barre Va Medical CenterCAshtabula County Medical CenterHematology/Oncology Physician CBloomington [facility] (Office):       [phone](Work cell):  [phone](Fax):           [phone] I, JYevette Edwards am acting as a scribe for Dr. GSullivan Lone   .I have reviewed the above documentation for accuracy and completeness, and I agree with the above. .Brunetta GeneraMD

## 2019-09-10 NOTE — Patient Instructions (Signed)

## 2019-09-11 ENCOUNTER — Telehealth: Payer: Self-pay | Admitting: *Deleted

## 2019-09-11 NOTE — Telephone Encounter (Signed)
FYI Oxycodone 10 mg order is subject to quantity limits per patient's [place] insurance.  Quantity limit of 180 tablets per thirty days.  Per Eric Oxford Remsen Jr. received qty of one hundred-twenty (120) oxycodone 10 mg tablets on 09/04/2019 after receipt of qty of 120 tablets on 08/22/2019.

## 2019-09-16 MED ORDER — POTASSIUM CHLORIDE CRYS ER 20 MEQ PO TBCR: 40.0000 meq | EXTENDED_RELEASE_TABLET | Freq: Two times a day (BID) | ORAL | 2 refills | Status: AC

## 2019-09-17 ENCOUNTER — Other Ambulatory Visit: Payer: Self-pay | Admitting: *Deleted

## 2019-09-17 MED ORDER — OXYCODONE HCL 10 MG PO TABS: 10.0000 mg | ORAL_TABLET | ORAL | 0 refills | Status: DC | PRN

## 2019-09-17 NOTE — Telephone Encounter (Signed)
Caregiver Danley Danker called. Patient requests refill of pain medication. Only has 6 pills left

## 2019-09-18 NOTE — Telephone Encounter (Signed)
I have tried to reach the pharmacy to know what pt's. options are but unable to reach them. I wanted to know when the 30 days elapses based on the already dispensed dates. Can an alternative be ordered?

## 2019-09-19 ENCOUNTER — Telehealth: Payer: Self-pay | Admitting: *Deleted

## 2019-09-19 ENCOUNTER — Other Ambulatory Visit: Payer: Self-pay | Admitting: Hematology

## 2019-09-19 ENCOUNTER — Encounter (HOSPITAL_COMMUNITY): Payer: Self-pay

## 2019-09-19 DIAGNOSIS — C7951 Secondary malignant neoplasm of bone: Secondary | ICD-10-CM

## 2019-09-19 DIAGNOSIS — C61 Malignant neoplasm of prostate: Secondary | ICD-10-CM

## 2019-09-19 MED ORDER — MORPHINE SULFATE ER 60 MG PO TBCR: 60.0000 mg | EXTENDED_RELEASE_TABLET | Freq: Three times a day (TID) | ORAL | 0 refills | Status: AC

## 2019-09-19 MED ORDER — OXYCODONE HCL 30 MG PO TABS: 30.0000 mg | ORAL_TABLET | ORAL | 0 refills | Status: AC | PRN

## 2019-09-19 NOTE — Progress Notes (Unsigned)
[name]  Eric Osborn Puna. Male, 74 y.o., 1945-08-02 MRN:  [id number] Phone:  [phone] [name] PCP:  [name], NP Coverage:  [name] Medicare/[employer] Medicare Next Appt With Radiology ([facility]) 09/26/2019 at 2:30 PM  RE: Biopsy Received: Today Message Details  Arne Cleveland, MD  [facility] [address] There is no required waiting period. COuld even be same day   Previous Messages  ----- Message -----  From: [name]  Sent: 09/19/2019 11:06 AM EDT  To: Arne Cleveland, MD  Subject: RE: Biopsy                    Hello   [name] is having a G Tube procedure on July 2   how many days do I wait to schedule his biopsy?   [name]  ----- Message -----  From: Arne Cleveland, MD  Sent: 09/19/2019 10:15 AM EDT  To: [name]  Subject: RE: Biopsy                    Ok   CT core R iliac met Soft tissue component   DDH    ----- Message -----  From: [name]  Sent: 09/18/2019  6:17 PM EDT  To: Ir Procedure Requests  Subject: Biopsy                      Procedure Requested: CT guided Biopsy    Reason for Procedure: of prominent bone metastasis possible in scapula for molecular testing for additional prostate cancer treatment options    Provider Requesting: [name]  Provider Telephone: [phone]

## 2019-09-19 NOTE — Telephone Encounter (Signed)
Patient's  caregiver called office (she comes to appointments with patient) He is unable to sleep due to pain. He is taking 2 oxycodone 10 mg every 4-5 hours, but it is not controlling the pain enough. Insurance allowed a partial fill of oxycodone and he was able to pick up 36 tablets of oxycodone 10 mg yesterday - should last him through the weekend, but not beyond that.  Contacted [employer] - they state limit of oxycodone is 180 pills/30days.  Dr. Irene Limbo informed of all information.  Contacted patient to confirm information shared by Ms. Edmonds in earlier call. He states that is correct and wants to know what can be done.  Per Dr. Eli Phillips. stop fentanyl patch -- replaced with MS contin 60mg  q8hrs. 2. Oxycodone changed to 30mg  tabs for breakthrough pain q4h as needed. 3. Radiation oncology referral placed- will need to let schedule know to evaluate if there is role for Radiation Therapy to address any of his painful mets. 4. Could ask patient is pallaitive care referral can be placed Contacted patient to inform him that Dr. Irene Limbo was changing his pain medication regime and to ask if would want a palliative care referral to assist with symptom management.  Patient stated he has been in a lot of pain. He said any help is appreciated. He asked that RN contact Ms. Edmonds with information about pain medicine and any appointments as he forgets. Informed patient that Ms. Ilda Foil will be called today.  Contacted caregiver (Ms. Ilda Foil) as patient asked she be contacted about new medications. Information given.

## 2019-09-22 NOTE — Progress Notes (Signed)
HEMATOLOGY/ONCOLOGY CLINIC NOTE  Date of Service: 09/23/19   Patient Care Team: [name], NP as PCP - General (Internal Medicine)  CHIEF COMPLAINTS/PURPOSE OF CONSULTATION:   F/u for continue mx of metastatic prostate cancer  HISTORY OF PRESENTING ILLNESS:  Eric Hiltz. is a wonderful 74 y.o. male who has been referred to [place] from the [facility] long hospital ED by [name] PA-C for evaluation and management of metastatic malignancy concerning for metastatic prostate cancer.  Patient has had a h/o locally advanced prostate cancer diagnosed in 2014 and had a radical prostatectomy and pelvic LN dissection by Dr [name] in 10/2012. Pathology showed pT3b pN0 disease (with positive margins, extraprostatic extension, seminal vesicle involvement and angiolymphatic invasion). Bone scan was negative for metastatic disease. Patient report no post-op RT. He notes that he received lupron shots as per his urologist for 1 year post-operatively. He was being monitored by Dr [name] and last had clinical evaluation and PSA about 9months ago - PSA level not available in our system. He notes that he was lost to f/u since then.  Patient presented to the ED with worsening lower and middle back pain for 2 weeks. Also notes about 15-20lbs weight loss and anorexia over the last 6-8weeks. He also notes hematuria. No fevers/chills. Patient had a CT abd/pelvis in the ED which showed Innumerable sclerotic lesions in all imaged bones consistent metastatic disease. The prostate gland has an irregular appearance and the patient may have prostate carcinoma. A few enlarged retroperitoneal lymph nodes are identified worrisome for additional foci of metastatic disease.  Patient's pain was somewhat controlled with prn percocet in ED and he was discharged home with f/u with [place]. Patient notes that the patient wakes him up in the night.  No urinary retention. No loss of bowel or bladder control. No new  neurological symptoms in his extremities.   INTERVAL HISTORY:  Eric Paci. is here to follow up of his metastatic prostatic cancer. We are joined today by his son. He is here for C2 of Cabazitaxel. The patient's last visit with [place] was on 09/10/2019. The pt reports that he is doing well overall.  The pt reports that he has been taking 60 mg Morphine three times per day and his back pain has been relatively well-controlled. His left shoulder is still the most painful area. Pt has not received his increased dose Oxycodone yet and is no longer using the Fentanyl Patch. Pt has been taking Senna + stool softener and Miralax every other day but has not had a bowel movement since Sunday.   Lab results today (09/23/19) of CBC w/diff and CMP is as follows: all values are WNL except for WBC at 15.6K, RBC at 3.40, Hgb at 9.0, HCT at 29.4, RDW at 23.0, nRBC at 0.6, Neutro Abs at 12.8K, Abs Immature Granulocytes at 0.30K, Glucose at 138, Total Protein at 5.9, Albumin at 2.6, AST at 142, ALP at 1562. 09/23/2019 Magnesium at 1.9 09/23/2019 Phosphorus at 2.6 09/23/2019 Prealbumin at 7.8 09/23/2019 PSA is in progress  On review of systems, pt reports constipation, SOB, left shoulder pain and denies any other symptoms.   MEDICAL HISTORY:   Past Medical History:  Diagnosis Date  . Arthritis    left knee, left shoulder  . Erectile dysfunction 06/25/2012  . Foley catheter in place   . Goiter 06/25/2012  . H/O hiatal hernia   . Hematuria    occasional  . Hemorrhoid   .  History of bladder infections   . Joint pain   . Prostate cancer [facility])     SURGICAL HISTORY: Past Surgical History:  Procedure Laterality Date  . INGUINAL HERNIA REPAIR  8 yrs ago  . LYMPHADENECTOMY Bilateral 11/20/2012   Procedure: WITH BILATERAL PELVIC LYMPH NODE DISSECTION ;  Surgeon: [name], MD;  Location: [facility] ORS;  Service: Urology;  Laterality: Bilateral;  . ROBOT ASSISTED LAPAROSCOPIC RADICAL PROSTATECTOMY N/A  11/20/2012   Procedure: ROBOTIC ASSISTED LAPAROSCOPIC RADICAL PROSTATECTOMY;  Surgeon: [name], MD;  Location: [facility] OR[facility];  Service: Urology;  Laterality: N/A;    SOCIAL HISTORY: Social History   Socioeconomic History  . Marital status: Divorced    Spouse name: Not on file  . Number of children: 3  . Years of education: 9  . Highest education level: Not on file  Occupational History  . Occupation: Retired    Comment: textile  Tobacco Use  . Smoking status: Former Smoker    Packs/day: 0.25    Years: 25.00    Pack years: 6.25    Types: Cigarettes    Quit date: 03/27/2014    Years since quitting: 5.4  . Smokeless tobacco: Former Systems developer  . Tobacco comment: quit date 2016  Vaping Use  . Vaping Use: Never used  Substance and Sexual Activity  . Alcohol use: Not Currently    Comment: occasional beer   . Drug use: Yes    Types: Marijuana    Comment: 3 x a month uses marijuana  . Sexual activity: Not Currently  Other Topics Concern  . Not on file  Social History Narrative   Regular exercise-no   Caffeine Use-yes   Social Determinants of Health   Financial Resource Strain:   . Difficulty of Paying Living Expenses:   Food Insecurity:   . Worried About Charity fundraiser in the Last Year:   . Arboriculturist in the Last Year:   Transportation Needs:   . Film/video editor (Medical):   [name] Lack of Transportation (Non-Medical):   Physical Activity:   . Days of Exercise per Week:   . Minutes of Exercise per Session:   Stress:   . Feeling of Stress :   Social Connections:   . Frequency of Communication with Friends and Family:   . Frequency of Social Gatherings with Friends and Family:   . Attends Religious Services:   . Active Member of Clubs or Organizations:   . Attends Archivist Meetings:   [name] Marital Status:   Intimate Partner Violence:   . Fear of Current or Ex-Partner:   . Emotionally Abused:   [name] Physically Abused:   . Sexually Abused:     FAMILY  HISTORY: Family History  Problem Relation Age of Onset  . Heart disease Sister   . Stroke Sister   . Colon cancer Paternal Uncle   . Cancer Daughter        unknown  . Prostate cancer Paternal Uncle   . Diabetes Neg Hx   . Breast cancer Neg Hx     ALLERGIES:  is allergic to heparin and poison ivy extract [poison ivy extract].  MEDICATIONS:  Current Outpatient Medications  Medication Sig Dispense Refill  . dronabinol (MARINOL) 5 MG capsule Take 1 capsule (5 mg total) by mouth 2 (two) times daily before a meal. 60 capsule 0  . leuprolide (LUPRON) 22.5 MG injection Inject 22.5 mg into the muscle every 3 (three) months.    [name]  lidocaine (LIDODERM) 5 % Place 1 patch onto the skin daily. Apply to area of maximal pain over lower back. Remove & Discard patch within 12 hours or as directed by MD 30 patch 0  . morphine (MS CONTIN) 60 MG 12 hr tablet Take 1 tablet (60 mg total) by mouth every 8 (eight) hours. 90 tablet 0  . mupirocin ointment (BACTROBAN) 2 % Apply 1 application topically 4 (four) times daily. 30 g 0  . Nutritional Supplements (FEEDING SUPPLEMENT, OSMOLITE 1.5 CAL,) LIQD Give 1 1/2 carton of osmolite 1.5 at 7:30am, 11:30am, 3:30pm and 7:30pm via feeding tube.  Flush with 47ml of water before and after each feeding.  Will need to drink or give via tube additional 277ml of water to better meet hydration needs.  Meets 100% of nutritional needs.  Send bolus supplies. 1422 mL 0  . oxycodone (ROXICODONE) 30 MG immediate release tablet Take 1 tablet (30 mg total) by mouth every 4 (four) hours as needed for pain. 180 tablet 0  . polyethylene glycol (MIRALAX) 17 g packet Take 17 g by mouth daily. 30 each 1  . potassium chloride SA (KLOR-CON) 20 MEQ tablet Take 2 tablets (40 mEq total) by mouth 2 (two) times daily. 120 tablet 2  . predniSONE (DELTASONE) 5 MG tablet TAKE 1 TABLET BY MOUTH ONCE DAILY WITH BREAKFAST 30 tablet 2  . senna-docusate (SENNA S) 8.6-50 MG tablet Take 2 tablets by mouth 2  (two) times daily. 120 tablet 1  . Vitamin D, Ergocalciferol, (DRISDOL) 1.25 MG (50000 UNIT) CAPS capsule Take 1 capsule (50,000 Units total) by mouth 2 (two) times a week. 24 capsule 6   Current Facility-Administered Medications  Medication Dose Route Frequency Provider Last Rate Last Admin  . morphine 4 MG/ML injection 2 mg  2 mg Intramuscular Once Bruning, Ashlyn, PA-C        REVIEW OF SYSTEMS:   A 10+ POINT REVIEW OF SYSTEMS WAS OBTAINED including neurology, dermatology, psychiatry, cardiac, respiratory, lymph, extremities, GI, GU, Musculoskeletal, constitutional, breasts, reproductive, HEENT.  All pertinent positives are noted in the HPI.  All others are negative.   PHYSICAL EXAMINATION: ECOG FS:1 - Symptomatic but completely ambulatory  Vitals:   09/23/19 1046  BP: 105/78  Pulse: (!) 117  Resp: 18  Temp: 97.7 F (36.5 C)  SpO2: 98%   Wt Readings from Last 3 Encounters:  09/23/19 130 lb 1.6 oz (59 kg)  09/10/19 133 lb 8 oz (60.6 kg)  09/01/19 135 lb 8 oz (61.5 kg)   Body mass index is 17.16 kg/m.    Exam was given in a chair   GENERAL:alert, in no acute distress and comfortable SKIN: no acute rashes, no significant lesions EYES: conjunctiva are pink and non-injected, sclera anicteric OROPHARYNX: MMM, no exudates, no oropharyngeal erythema or ulceration NECK: supple, no JVD LYMPH:  no palpable lymphadenopathy in the cervical, axillary or inguinal regions LUNGS: clear to auscultation b/l with normal respiratory effort HEART: regular rate & rhythm ABDOMEN:  normoactive bowel sounds , non tender, not distended. No palpable hepatosplenomegaly.  Extremity: no pedal edema PSYCH: alert & oriented x 3 with fluent speech NEURO: no focal motor/sensory deficits  LABORATORY DATA:  I have reviewed the data as listed  . CBC Latest Ref Rng & Units 09/23/2019 09/10/2019 09/01/2019  WBC 4.0 - 10.5 K/uL 15.6(H) 5.3 5.0  Hemoglobin 13.0 - 17.0 g/dL 9.0(L) 9.8(L) 9.4(L)  Hematocrit  39 - 52 % 29.4(L) 32.7(L) 31.2(L)  Platelets 150 - 400 K/uL 210  362 503(H)    CMP Latest Ref Rng & Units 09/23/2019 09/10/2019 09/01/2019  Glucose 70 - 99 mg/dL 138(H) 116(H) 91  BUN 8 - 23 mg/dL 17 10 8   Creatinine 0.61 - 1.24 mg/dL 0.64 0.57(L) 0.50(L)  Sodium 135 - 145 mmol/L 140 141 140  Potassium 3.5 - 5.1 mmol/L 4.1 2.8(LL) 3.2(L)  Chloride 98 - 111 mmol/L 103 100 102  CO2 22 - 32 mmol/L 27 32 25  Calcium 8.9 - 10.3 mg/dL 9.1 8.3(L) 8.1(L)  Total Protein 6.5 - 8.1 g/dL 5.9(L) 5.9(L) 6.2(L)  Total Bilirubin 0.3 - 1.2 mg/dL 0.4 0.3 0.5  Alkaline Phos 38 - 126 U/L 1,562(H) 1,332(H) 1,072(H)  AST 15 - 41 U/L 142(H) 106(H) 69(H)  ALT 0 - 44 U/L 11 <6 6   .   08/14/2017 Peripheral Blood Flow Cytometry           RADIOGRAPHIC STUDIES: I have personally reviewed the radiological images as listed and agreed with the findings in the report. No results found.  ASSESSMENT & PLAN:   74 y.o. AAM with previous h/o of locally advanced pT3b,pN0 prostate cancer now with concern for newly noted metastatic prostate cancer.  1) Metastatic Prostate Cancer  PSA level 550 pre-treatment and have improved significantly and were down to 0.6. Now gradually rising again and if upt o  49.7  without any other associated new symptoms. Patient had been noted to have extensive osseous metastatic disease throughout the spine and bilaterally in the calvarium as well. Also noted to have local recurrence in the prostatic bed, retroperitoneal Lnadenoapathy and some mediastinal LNadenoapathy.  ECOG PS 1-2 Previously locally advanced pT3b,pN0 diagnosed in 10/2012 s/p radical prostatectomy and ADT x 1 yr. Testosterone level 7 -- is dropping appropriately with treatment. S/p 6 cycles of taxotere.  12/25/17 PET/CT revealed Solitary retroperitoneal lymph node with radiotracer activity adjacent the IVC most consistent with prostate cancer metastasis. 2. Distant nodal metastasis in the LEFT and RIGHT axilla. Not  typical location for prostate cancer metastasis but the activity is intense and favor such. 3. New periosteal reaction within the RIGHT iliac bone and LEFT scapula with intense radiotracer activity consistent active skeletal metastasis. Additional active metastasis in the RIGHT mandible and proximal RIGHT femur.   04/10/2019 MR SACRUM SI JOINTS W WO CONTRAST (Accession [id number]) revealed "1. Epidural tumor anteriorly in the sacral spinal canal at the S2 level measuring 2.7 by 1.0 by 2.6 cm, with mass effect on the S2 nerve roots and below due to effacement of the sacral spinal canal. Minimal extension into the medial S2 neural foramina. 2. Extensive multifocal metastatic involvement in the lower lumbar spine, bony pelvis, and right proximal femur similar to that noted previously, with expansile lesions in the right iliac bone and right ischium. 3. Extraosseous extension of tumor adjacent to the right iliac bone primarily along the deep margin of the iloipsoas, but also posteriorly adjacent to the gluteal musculature. 4. Nonspecific low-grade edema in the piriformis musculature, right greater than left, possibly neurogenic."  08/21/2019 PET ([facility]) ([id number]) revealed "1. Progression prostate cancer skeletal metastasis. Dominant progression is continued exuberant periosteal reaction with extraosseous extension noted in the LEFT and RIGHT iliac fossa, posterior RIGHT ninth rib and RIGHT shoulder girdle. This soft tissue extraosseous extension adjacent to the periosteal reaction has intense radiotracer activity 2. Persistent multifocal skeletal metastasis elsewhere in the axillary and appendicular skeleton. 3. No evidence prostate cancer nodal metastasis. 4. No evidence of liver metastasis or pulmonary metastasis."  2) s/p  Anterior mandibular pain  Orthopantogram done - showed IMPRESSION: Residual teeth 23-26 with decay.  No mandibular osseous lesion seen Patient has completed dental extractions and is  using dentures and  3) Elevated alkaline phosphatase level due to extensive bone mets.   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 Stable. -would anticipate mild anemia from ADT  5) Hematuria due to local recurrence of prostate cancer - 1 episode of hematuria - now resolved  6) Anorexia and weight loss due to progressive prostate cancer Patient notes he's been eating much better. . Wt Readings from Last 3 Encounters:  09/23/19 130 lb 1.6 oz (59 kg)  09/10/19 133 lb 8 oz (60.6 kg)  09/01/19 135 lb 8 oz (61.5 kg)    7) Vit D deficiency with hypocalcemia -- levels are normalizing. -Being aggressively replaced since his calcium levels have dropped after Xgeva likely reflecting significant bone calcium deficits from extensive healing bone lesions. -Continue ergocalciferol 50,000U weekly  -Continue Vit D daily    8) Neoplasm related pain - much improved patient is has not needed much of his prescribed pain medications Plan  -Continue Fentanyl patch 23mcg/h and prn Oxycodone as per orders (reasonable control) -Senna S and Miralax for bowel prophylaxis -Rx prostate cancer  9) Colonic thickening in transverse colon thought to be related to spasm/incomplete distension of colon in this area. -would recommend pursuing colonoscopy with PCP --pending  10) Left shoulder pain Patient has left shoulder pain s/p injury 3-4 months ago.  -XR left shoulder today- reviewed results - No fracture or dislocation is noted in the left shoulder. Sclerosis of scapula is noted consistent with metastatic disease.  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 -Discussed 09/23/2019 Magnesium &  Phosphorus is WNL, Prealbumin is very low, PSA is in progress -The pt has no prohibitive toxicities from continuing C2 Cabazitaxel with G-CSF support at this time.  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  -Discussed moving cares to be closer to his home, as he has a fair burden of treatment. Pt has decided to continue care here for now.  -Recommend pt f/u as scheduled for Gastrostomy at 09/26/19 and CT Biopsy on 09/30/19 -Advised pt to take Miralax daily and Senna + stool softener twice per day  -Recommend pt f/u with Radiation Oncology as scheduled -Continue Zometa q4weeks and Lupron q12weeks -Will see back in 3 weeks with labs    FOLLOW UP: -Plz schedule C2D3 for udenyca -Plz schedule C3 of Cabazitaxel with port flush, labs and MD visit -f/u for G tube and radiation oncology appointments as scheduled.    The total time spent in the appt was 30 minutes and more than 50% was on counseling and direct patient cares.  All of the patient's questions were answered with apparent satisfaction. The patient knows to call the clinic with any problems, questions or concerns.   Sullivan Lone MD [place] AAHIVMS [facility] Hematology/Oncology Physician [facility]  (Office):       [phone] (Work cell):  [phone] (Fax):           [phone]  I, Yevette Edwards, am acting as a Education administrator for Dr. Sullivan Lone.   .I have reviewed the above documentation for accuracy and completeness, and I agree with the above. [name] MD

## 2019-09-23 ENCOUNTER — Inpatient Hospital Stay: Payer: Medicare Other

## 2019-09-23 ENCOUNTER — Other Ambulatory Visit: Payer: Self-pay | Admitting: *Deleted

## 2019-09-23 ENCOUNTER — Inpatient Hospital Stay (HOSPITAL_BASED_OUTPATIENT_CLINIC_OR_DEPARTMENT_OTHER): Payer: Medicare Other | Admitting: Hematology

## 2019-09-23 ENCOUNTER — Telehealth: Payer: Self-pay | Admitting: *Deleted

## 2019-09-23 ENCOUNTER — Ambulatory Visit: Payer: Medicare Other

## 2019-09-23 ENCOUNTER — Other Ambulatory Visit (HOSPITAL_COMMUNITY): Payer: Medicare Other

## 2019-09-23 ENCOUNTER — Other Ambulatory Visit: Payer: Self-pay

## 2019-09-23 ENCOUNTER — Other Ambulatory Visit: Payer: Medicare Other

## 2019-09-23 VITALS — HR 72

## 2019-09-23 VITALS — BP 105/78 | HR 117 | Temp 97.7°F | Resp 18 | Ht 73.0 in | Wt 130.1 lb

## 2019-09-23 DIAGNOSIS — C7951 Secondary malignant neoplasm of bone: Secondary | ICD-10-CM | POA: Diagnosis not present

## 2019-09-23 DIAGNOSIS — Z7189 Other specified counseling: Secondary | ICD-10-CM

## 2019-09-23 DIAGNOSIS — C61 Malignant neoplasm of prostate: Secondary | ICD-10-CM

## 2019-09-23 DIAGNOSIS — G893 Neoplasm related pain (acute) (chronic): Secondary | ICD-10-CM | POA: Diagnosis not present

## 2019-09-23 DIAGNOSIS — Z5111 Encounter for antineoplastic chemotherapy: Secondary | ICD-10-CM | POA: Diagnosis not present

## 2019-09-23 LAB — CMP (CANCER CENTER ONLY)
ALT: 11 U/L (ref 0–44)
AST: 142 U/L — ABNORMAL HIGH (ref 15–41)
Albumin: 2.6 g/dL — ABNORMAL LOW (ref 3.5–5.0)
Alkaline Phosphatase: 1562 U/L — ABNORMAL HIGH (ref 38–126)
Anion gap: 10 (ref 5–15)
BUN: 17 mg/dL (ref 8–23)
CO2: 27 mmol/L (ref 22–32)
Calcium: 9.1 mg/dL (ref 8.9–10.3)
Chloride: 103 mmol/L (ref 98–111)
Creatinine: 0.64 mg/dL (ref 0.61–1.24)
GFR, Est AFR Am: >60 mL/min (ref >60)
GFR, Estimated: >60 mL/min (ref >60)
Glucose, Bld: 138 mg/dL — ABNORMAL HIGH (ref 70–99)
Potassium: 4.1 mmol/L (ref 3.5–5.1)
Sodium: 140 mmol/L (ref 135–145)
Total Bilirubin: 0.4 mg/dL (ref 0.3–1.2)
Total Protein: 5.9 g/dL — ABNORMAL LOW (ref 6.5–8.1)

## 2019-09-23 LAB — CBC WITH DIFFERENTIAL/PLATELET
Abs Immature Granulocytes: 0.3 10*3/uL — ABNORMAL HIGH (ref 0.00–0.07)
Basophils Absolute: 0.1 10*3/uL (ref 0.0–0.1)
Basophils Relative: 0 %
Eosinophils Absolute: 0 10*3/uL (ref 0.0–0.5)
Eosinophils Relative: 0 %
HCT: 29.4 % — ABNORMAL LOW (ref 39.0–52.0)
Hemoglobin: 9 g/dL — ABNORMAL LOW (ref 13.0–17.0)
Immature Granulocytes: 2 %
Lymphocytes Relative: 13 %
Lymphs Abs: 2 10*3/uL (ref 0.7–4.0)
MCH: 26.5 pg (ref 26.0–34.0)
MCHC: 30.6 g/dL (ref 30.0–36.0)
MCV: 86.5 fL (ref 80.0–100.0)
Monocytes Absolute: 0.4 10*3/uL (ref 0.1–1.0)
Monocytes Relative: 3 %
Neutro Abs: 12.8 10*3/uL — ABNORMAL HIGH (ref 1.7–7.7)
Neutrophils Relative %: 82 %
Platelets: 210 10*3/uL (ref 150–400)
RBC: 3.4 MIL/uL — ABNORMAL LOW (ref 4.22–5.81)
RDW: 23 % — ABNORMAL HIGH (ref 11.5–15.5)
WBC: 15.6 10*3/uL — ABNORMAL HIGH (ref 4.0–10.5)
nRBC: 0.6 % — ABNORMAL HIGH (ref 0.0–0.2)

## 2019-09-23 LAB — MAGNESIUM: Magnesium: 1.9 mg/dL (ref 1.7–2.4)

## 2019-09-23 LAB — PREALBUMIN: Prealbumin: 7.8 mg/dL — ABNORMAL LOW (ref 18–38)

## 2019-09-23 LAB — PHOSPHORUS: Phosphorus: 2.6 mg/dL (ref 2.5–4.6)

## 2019-09-23 MED ORDER — ZOLEDRONIC ACID 4 MG/100ML IV SOLN
4.0000 mg | Freq: Once | INTRAVENOUS | Status: AC
  Administered 2019-09-23: 4 mg via INTRAVENOUS

## 2019-09-23 MED ORDER — SODIUM CHLORIDE 0.9 % IV SOLN
Freq: Once | INTRAVENOUS | Status: AC
  Filled 2019-09-23: qty 250

## 2019-09-23 MED ORDER — DIPHENHYDRAMINE HCL 50 MG/ML IJ SOLN
25.0000 mg | Freq: Once | INTRAMUSCULAR | Status: AC
  Administered 2019-09-23: 25 mg via INTRAVENOUS

## 2019-09-23 MED ORDER — DIPHENHYDRAMINE HCL 50 MG/ML IJ SOLN
INTRAMUSCULAR | Status: AC
  Filled 2019-09-23: qty 1

## 2019-09-23 MED ORDER — SODIUM CHLORIDE 0.9 % IV SOLN
15.0000 mg/m2 | Freq: Once | INTRAVENOUS | Status: AC
  Administered 2019-09-23: 27 mg via INTRAVENOUS
  Filled 2019-09-23: qty 2.7

## 2019-09-23 MED ORDER — ZOLEDRONIC ACID 4 MG/100ML IV SOLN
INTRAVENOUS | Status: AC
  Filled 2019-09-23: qty 100

## 2019-09-23 MED ORDER — OSMOLITE 1.5 CAL PO LIQD: ORAL | 0 refills | Status: AC

## 2019-09-23 MED ORDER — SODIUM CHLORIDE 0.9 % IV SOLN
10.0000 mg | Freq: Once | INTRAVENOUS | Status: AC
  Administered 2019-09-23: 10 mg via INTRAVENOUS
  Filled 2019-09-23: qty 10

## 2019-09-23 MED ORDER — FAMOTIDINE IN NACL 20-0.9 MG/50ML-% IV SOLN
20.0000 mg | Freq: Once | INTRAVENOUS | Status: AC
  Administered 2019-09-23: 20 mg via INTRAVENOUS

## 2019-09-23 MED ORDER — FAMOTIDINE IN NACL 20-0.9 MG/50ML-% IV SOLN
INTRAVENOUS | Status: AC
  Filled 2019-09-23: qty 50

## 2019-09-23 NOTE — Addendum Note (Signed)
Addended by: Jennet Maduro B on: 09/23/2019 12:27 PM   Modules accepted: Orders

## 2019-09-23 NOTE — Progress Notes (Signed)
Verbal order per Dr. Irene Limbo - ok to receive treatment today with elevated AST and Alk Phos.

## 2019-09-23 NOTE — Progress Notes (Addendum)
Nutrition Follow-up:  Patient with metastatic prostate cancer, followed by Dr. Irene Limbo.  zPatient receiving cabazitaxel, lupron and zometa.  Noted planning PEG tube by IR on Friday 09/26/19.    Met with patient and son in examine room at MD visit.  Patient reports he may be eating a little bit better.  Reports pain is better controlled.  Reports last bowel movement 2 days ago.  Reports that MD talked to him about new bowel regimen today with pain medications.      Medications: morphine, senna-docusate, VIt D, miralax  Labs:  Glucose 138, Mag and Phosphorus WNL  Anthropometrics:   Weight 130 lb 1.6 oz today decreased from 135 lb on 6/1 174 lb on 2/9  25% weight loss in the last 4 months, signficant   Re-Estimated Energy Needs  Kcals: 1800-2100 Protein: 90-105 g Fluid: > 1.8 L  NUTRITION DIAGNOSIS: Inadequate oral intake continues with continued weight loss   INTERVENTION:  Spoke with Dr. Irene Limbo and recommend checking Mag and Phosphorus today prior to PEG placement.  Recommend CMP, mag and phosphorus being checked 2 times per week for 2 weeks after PEG placed as patient at risk of refeeding syndrome with weight loss.  Recommend osmolite 1.5, 6 cartons per day if unable to take anything by mouth (1 1/2 QID).  Flush with 71m of water before and after each feeding.  Will need to drink or provide via tube additional 240 ml of water daily to better meet hydration needs. Patient to start with 1/2 carton of tube feeding QID until seen in follow-up for nutrition. Written instructions provided to patient and son.  Tube feeding provides 2130 calories, 108 gm protein and 1800 ml free water This meets 100% of nutritional needs.  Demonstrated how to use feeding tube for feeding today. Sample tube shown to patient and son.  Provided literature on PEG care.  Nursing working on home health to provide feeding tube care in the home as well. Contacted [facility] for enteral supplies. Provided patient and son  with contact information       MONITORING, EVALUATION, GOAL: weight trends, intake, tube feeding tolerance   NEXT VISIT: Tuesday, July 6 with BErnestene Kiel  Patient and son aware.  Caregiver SCathie Oldenwill be present at this visit.    Sierria Bruney B. AZenia Resides RBlythewood LNew WaterfordRegistered Dietitian [phone](pager)

## 2019-09-23 NOTE — Patient Instructions (Addendum)
[facility] Discharge Instructions for Patients Receiving Chemotherapy  Today you received the following chemotherapy agents: cabazitaxel.  To help prevent nausea and vomiting after your treatment, we encourage you to take your nausea medication as directed.   If you develop nausea and vomiting that is not controlled by your nausea medication, call the clinic.   BELOW ARE SYMPTOMS THAT SHOULD BE REPORTED IMMEDIATELY:  *FEVER GREATER THAN 100.5 F  *CHILLS WITH OR WITHOUT FEVER  NAUSEA AND VOMITING THAT IS NOT CONTROLLED WITH YOUR NAUSEA MEDICATION  *UNUSUAL SHORTNESS OF BREATH  *UNUSUAL BRUISING OR BLEEDING  TENDERNESS IN MOUTH AND THROAT WITH OR WITHOUT PRESENCE OF ULCERS  *URINARY PROBLEMS  *BOWEL PROBLEMS  UNUSUAL RASH Items with * indicate a potential emergency and should be followed up as soon as possible.  Feel free to call the clinic should you have any questions or concerns. The clinic phone number is [phone].  Please show the [facility] at check-in to the Emergency Department and triage nurse.    Zoledronic Acid injection (Hypercalcemia, Oncology) What is this medicine? ZOLEDRONIC ACID (ZOE le dron ik AS id) lowers the amount of calcium loss from bone. It is used to treat too much calcium in your blood from cancer. It is also used to prevent complications of cancer that has spread to the bone. This medicine may be used for other purposes; ask your health care provider or pharmacist if you have questions. COMMON BRAND NAME(S): Zometa What should I tell my health care provider before I take this medicine? They need to know if you have any of these conditions:  aspirin-sensitive asthma  cancer, especially if you are receiving medicines used to treat cancer  dental disease or wear dentures  infection  kidney disease  receiving corticosteroids like dexamethasone or prednisone  an unusual or allergic reaction to zoledronic acid,  other medicines, foods, dyes, or preservatives  pregnant or trying to get pregnant  breast-feeding How should I use this medicine? This medicine is for infusion into a vein. It is given by a health care professional in a hospital or clinic setting. Talk to your pediatrician regarding the use of this medicine in children. Special care may be needed. Overdosage: If you think you have taken too much of this medicine contact a poison control center or emergency room at once. NOTE: This medicine is only for you. Do not share this medicine with others. What if I miss a dose? It is important not to miss your dose. Call your doctor or health care professional if you are unable to keep an appointment. What may interact with this medicine?  certain antibiotics given by injection  NSAIDs, medicines for pain and inflammation, like ibuprofen or naproxen  some diuretics like bumetanide, furosemide  teriparatide  thalidomide This list may not describe all possible interactions. Give your health care provider a list of all the medicines, herbs, non-prescription drugs, or dietary supplements you use. Also tell them if you smoke, drink alcohol, or use illegal drugs. Some items may interact with your medicine. What should I watch for while using this medicine? Visit your doctor or health care professional for regular checkups. It may be some time before you see the benefit from this medicine. Do not stop taking your medicine unless your doctor tells you to. Your doctor may order blood tests or other tests to see how you are doing. Women should inform their doctor if they wish to become pregnant or think they might  be pregnant. There is a potential for serious side effects to an unborn child. Talk to your health care professional or pharmacist for more information. You should make sure that you get enough calcium and vitamin D while you are taking this medicine. Discuss the foods you eat and the vitamins you  take with your health care professional. Some people who take this medicine have severe bone, joint, and/or muscle pain. This medicine may also increase your risk for jaw problems or a broken thigh bone. Tell your doctor right away if you have severe pain in your jaw, bones, joints, or muscles. Tell your doctor if you have any pain that does not go away or that gets worse. Tell your dentist and dental surgeon that you are taking this medicine. You should not have major dental surgery while on this medicine. See your dentist to have a dental exam and fix any dental problems before starting this medicine. Take good care of your teeth while on this medicine. Make sure you see your dentist for regular follow-up appointments. What side effects may I notice from receiving this medicine? Side effects that you should report to your doctor or health care professional as soon as possible:  allergic reactions like skin rash, itching or hives, swelling of the face, lips, or tongue  anxiety, confusion, or depression  breathing problems  changes in vision  eye pain  feeling faint or lightheaded, falls  jaw pain, especially after dental work  mouth sores  muscle cramps, stiffness, or weakness  redness, blistering, peeling or loosening of the skin, including inside the mouth  trouble passing urine or change in the amount of urine Side effects that usually do not require medical attention (report to your doctor or health care professional if they continue or are bothersome):  bone, joint, or muscle pain  constipation  diarrhea  fever  hair loss  irritation at site where injected  loss of appetite  nausea, vomiting  stomach upset  trouble sleeping  trouble swallowing  weak or tired This list may not describe all possible side effects. Call your doctor for medical advice about side effects. You may report side effects to FDA at 1-800-FDA-1088. Where should I keep my medicine? This  drug is given in a hospital or clinic and will not be stored at home. NOTE: This sheet is a summary. It may not cover all possible information. If you have questions about this medicine, talk to your doctor, pharmacist, or health care provider.  2020 Elsevier/Gold Standard (2013-08-09 14:19:39)

## 2019-09-23 NOTE — Telephone Encounter (Addendum)
[facility] in [place] - spoke with  Jacklynn Lewis, RN: [phone]. Patient/Ins info given. Fax [phone] MD has referred for Management of PEG tube (09/26/19) and feeding; Medication management; PT to assist with strength and balance; Lab work  Patient demographic, ins info, recent MD office note and recent labs faxed to number provided. Fax confirmation received. [facility] will notify this office if patient is accepted for care as ordered  09/24/19- [facility] declined patient d/t insurance

## 2019-09-24 ENCOUNTER — Other Ambulatory Visit (HOSPITAL_COMMUNITY): Payer: Medicare Other

## 2019-09-24 LAB — PSA, TOTAL AND FREE
PSA, Free Pct: >3.9 %
PSA, Free: >50 ng/mL
Prostate Specific Ag, Serum: 1282 ng/mL — ABNORMAL HIGH (ref 0.0–4.0)

## 2019-09-25 ENCOUNTER — Other Ambulatory Visit (HOSPITAL_COMMUNITY)
Admission: RE | Admit: 2019-09-25 | Discharge: 2019-09-25 | Disposition: A | Discharge status: Home / Self Care | Payer: Medicare Other | Source: Ambulatory Visit | Attending: Hematology | Admitting: Hematology

## 2019-09-25 ENCOUNTER — Telehealth: Payer: Self-pay | Admitting: *Deleted

## 2019-09-25 ENCOUNTER — Inpatient Hospital Stay: Payer: Medicare Other | Attending: Hematology

## 2019-09-25 ENCOUNTER — Other Ambulatory Visit: Payer: Self-pay | Admitting: Radiology

## 2019-09-25 ENCOUNTER — Other Ambulatory Visit: Payer: Self-pay | Admitting: Student

## 2019-09-25 ENCOUNTER — Other Ambulatory Visit: Payer: Self-pay

## 2019-09-25 VITALS — BP 104/65 | HR 103 | Temp 98.1°F | Resp 18

## 2019-09-25 DIAGNOSIS — C7951 Secondary malignant neoplasm of bone: Secondary | ICD-10-CM | POA: Diagnosis present

## 2019-09-25 DIAGNOSIS — Z7189 Other specified counseling: Secondary | ICD-10-CM

## 2019-09-25 DIAGNOSIS — Z5189 Encounter for other specified aftercare: Secondary | ICD-10-CM | POA: Diagnosis not present

## 2019-09-25 DIAGNOSIS — Z20822 Contact with and (suspected) exposure to covid-19: Secondary | ICD-10-CM | POA: Insufficient documentation

## 2019-09-25 DIAGNOSIS — C61 Malignant neoplasm of prostate: Secondary | ICD-10-CM | POA: Insufficient documentation

## 2019-09-25 DIAGNOSIS — Z01812 Encounter for preprocedural laboratory examination: Secondary | ICD-10-CM | POA: Insufficient documentation

## 2019-09-25 LAB — SARS CORONAVIRUS 2 (TAT 6-24 HRS): SARS Coronavirus 2: NEGATIVE

## 2019-09-25 MED ORDER — PEGFILGRASTIM-CBQV 6 MG/0.6ML ~~LOC~~ SOSY
6.0000 mg | PREFILLED_SYRINGE | Freq: Once | SUBCUTANEOUS | Status: AC
  Administered 2019-09-25: 6 mg via SUBCUTANEOUS

## 2019-09-25 MED ORDER — LEUPROLIDE ACETATE (3 MONTH) 22.5 MG ~~LOC~~ KIT
PACK | SUBCUTANEOUS | Status: AC
  Filled 2019-09-25: qty 22.5

## 2019-09-25 MED ORDER — LEUPROLIDE ACETATE (3 MONTH) 22.5 MG ~~LOC~~ KIT: 22.5000 mg | PACK | Freq: Once | SUBCUTANEOUS | Status: DC

## 2019-09-25 MED ORDER — PEGFILGRASTIM-CBQV 6 MG/0.6ML ~~LOC~~ SOSY
PREFILLED_SYRINGE | SUBCUTANEOUS | Status: AC
  Filled 2019-09-25: qty 0.6

## 2019-09-25 NOTE — Telephone Encounter (Addendum)
[facility] [employer] [employer] in [place] - spoke with  [place]: [phone]. Patient/Ins info given. Fax [phone] MD has referred for Management of PEG tube (09/26/19) and feeding; Medication management; PT to assist with strength and balance; Lab work  Patient demographic, ins info, recent MD office note and recent labs faxed to number provided. Fax confirmation received.   [facility] will notify this office if patient is accepted for care as ordered  Contacted by Sherlie Ban from [facility] - they will accept patient for home care with planned start date of 09/30/19 Contacted patient to inform him that [name] will contact him - LVM with information and asked patient to call for any questions

## 2019-09-25 NOTE — Telephone Encounter (Signed)
Per direction of Jennet Maduro, RD: Faxed following to [facility]: patient demographics,Ms.Allen's office note, order for tube feeding and MD OV note 6/16. Fax confirmation received

## 2019-09-26 ENCOUNTER — Observation Stay (HOSPITAL_COMMUNITY)
Admission: RE | Admit: 2019-09-26 | Discharge: 2019-09-26 | Disposition: A | Discharge status: Home / Self Care | Payer: Medicare Other | Source: Ambulatory Visit | Attending: Hematology | Admitting: Hematology

## 2019-09-26 ENCOUNTER — Ambulatory Visit (HOSPITAL_COMMUNITY)
Admission: RE | Admit: 2019-09-26 | Discharge: 2019-09-26 | Disposition: A | Discharge status: Home / Self Care | Payer: Medicare Other | Source: Ambulatory Visit | Attending: Hematology | Admitting: Hematology

## 2019-09-26 ENCOUNTER — Other Ambulatory Visit: Payer: Self-pay

## 2019-09-26 ENCOUNTER — Encounter (HOSPITAL_COMMUNITY): Payer: Self-pay

## 2019-09-26 DIAGNOSIS — K449 Diaphragmatic hernia without obstruction or gangrene: Secondary | ICD-10-CM | POA: Insufficient documentation

## 2019-09-26 DIAGNOSIS — Z8249 Family history of ischemic heart disease and other diseases of the circulatory system: Secondary | ICD-10-CM | POA: Insufficient documentation

## 2019-09-26 DIAGNOSIS — Z8042 Family history of malignant neoplasm of prostate: Secondary | ICD-10-CM | POA: Diagnosis not present

## 2019-09-26 DIAGNOSIS — E43 Unspecified severe protein-calorie malnutrition: Secondary | ICD-10-CM | POA: Insufficient documentation

## 2019-09-26 DIAGNOSIS — Z800 Family history of malignant neoplasm of digestive organs: Secondary | ICD-10-CM | POA: Insufficient documentation

## 2019-09-26 DIAGNOSIS — C61 Malignant neoplasm of prostate: Secondary | ICD-10-CM | POA: Insufficient documentation

## 2019-09-26 DIAGNOSIS — Z823 Family history of stroke: Secondary | ICD-10-CM | POA: Diagnosis not present

## 2019-09-26 DIAGNOSIS — C799 Secondary malignant neoplasm of unspecified site: Secondary | ICD-10-CM | POA: Insufficient documentation

## 2019-09-26 DIAGNOSIS — Z888 Allergy status to other drugs, medicaments and biological substances status: Secondary | ICD-10-CM | POA: Insufficient documentation

## 2019-09-26 DIAGNOSIS — Z79899 Other long term (current) drug therapy: Secondary | ICD-10-CM | POA: Insufficient documentation

## 2019-09-26 DIAGNOSIS — Z87891 Personal history of nicotine dependence: Secondary | ICD-10-CM | POA: Diagnosis not present

## 2019-09-26 DIAGNOSIS — Z9109 Other allergy status, other than to drugs and biological substances: Secondary | ICD-10-CM | POA: Diagnosis not present

## 2019-09-26 HISTORY — PX: IR GASTROSTOMY TUBE MOD SED: IMG625

## 2019-09-26 LAB — CBC
HCT: 27.4 % — ABNORMAL LOW (ref 39.0–52.0)
Hemoglobin: 8.3 g/dL — ABNORMAL LOW (ref 13.0–17.0)
MCH: 26.5 pg (ref 26.0–34.0)
MCHC: 30.3 g/dL (ref 30.0–36.0)
MCV: 87.5 fL (ref 80.0–100.0)
Platelets: 230 10*3/uL (ref 150–400)
RBC: 3.13 MIL/uL — ABNORMAL LOW (ref 4.22–5.81)
RDW: 23.5 % — ABNORMAL HIGH (ref 11.5–15.5)
WBC: 49.1 10*3/uL — ABNORMAL HIGH (ref 4.0–10.5)
nRBC: 0.2 % (ref 0.0–0.2)

## 2019-09-26 LAB — GLUCOSE, CAPILLARY: Glucose-Capillary: 103 mg/dL — ABNORMAL HIGH (ref 70–99)

## 2019-09-26 LAB — PROTIME-INR
INR: 1.1 (ref 0.8–1.2)
Prothrombin Time: 13.6 seconds (ref 11.4–15.2)

## 2019-09-26 MED ORDER — GLUCAGON HCL (RDNA) 1 MG IJ SOLR
INTRAMUSCULAR | Status: AC | PRN
  Administered 2019-09-26: .5 mg via INTRAVENOUS

## 2019-09-26 MED ORDER — FENTANYL CITRATE (PF) 100 MCG/2ML IJ SOLN
INTRAMUSCULAR | Status: AC | PRN
  Administered 2019-09-26 (×2): 50 ug via INTRAVENOUS

## 2019-09-26 MED ORDER — IOHEXOL 300 MG/ML  SOLN
50.0000 mL | Freq: Once | INTRAMUSCULAR | Status: AC | PRN
  Administered 2019-09-26: 10 mL

## 2019-09-26 MED ORDER — SODIUM CHLORIDE 0.9 % IV SOLN: INTRAVENOUS | Status: DC

## 2019-09-26 MED ORDER — FENTANYL CITRATE (PF) 100 MCG/2ML IJ SOLN
INTRAMUSCULAR | Status: AC
  Filled 2019-09-26: qty 2

## 2019-09-26 MED ORDER — HYDROCODONE-ACETAMINOPHEN 5-325 MG PO TABS
1.0000 | ORAL_TABLET | ORAL | Status: DC | PRN
  Administered 2019-09-26: 1 via ORAL
  Filled 2019-09-26: qty 1

## 2019-09-26 MED ORDER — MIDAZOLAM HCL 2 MG/2ML IJ SOLN
INTRAMUSCULAR | Status: AC
  Filled 2019-09-26: qty 2

## 2019-09-26 MED ORDER — LIDOCAINE HCL 1 % IJ SOLN
INTRAMUSCULAR | Status: AC
  Filled 2019-09-26: qty 20

## 2019-09-26 MED ORDER — CEFAZOLIN SODIUM-DEXTROSE 2-4 GM/100ML-% IV SOLN
INTRAVENOUS | Status: AC
  Administered 2019-09-26: 2 g via INTRAVENOUS
  Filled 2019-09-26: qty 100

## 2019-09-26 MED ORDER — GLUCAGON HCL RDNA (DIAGNOSTIC) 1 MG IJ SOLR
INTRAMUSCULAR | Status: AC
  Filled 2019-09-26: qty 1

## 2019-09-26 MED ORDER — ONDANSETRON HCL 4 MG/2ML IJ SOLN
4.0000 mg | Freq: Once | INTRAMUSCULAR | Status: AC
  Administered 2019-09-26: 4 mg via INTRAVENOUS
  Filled 2019-09-26: qty 2

## 2019-09-26 MED ORDER — MIDAZOLAM HCL 2 MG/2ML IJ SOLN
INTRAMUSCULAR | Status: AC | PRN
  Administered 2019-09-26 (×2): 1 mg via INTRAVENOUS

## 2019-09-26 MED ORDER — CEFAZOLIN SODIUM-DEXTROSE 2-4 GM/100ML-% IV SOLN: 2.0000 g | Freq: Once | INTRAVENOUS | Status: AC

## 2019-09-26 MED ORDER — LIDOCAINE HCL (PF) 1 % IJ SOLN
INTRAMUSCULAR | Status: AC | PRN
  Administered 2019-09-26: 10 mL via INTRADERMAL

## 2019-09-26 NOTE — Procedures (Signed)
Interventional Radiology Procedure:   Indications: Malnutrition and prostate cancer  Procedure: Gastrostomy tube placement  Findings: 20 Fr tube in stomach  Complications: None     EBL: less than 10 ml  Plan: May use gastrostomy tube for feeds starting tomorrow, 09/27/19.   Jahmeir Geisen R. Anselm Pancoast, MD  Pager: [phone]

## 2019-09-26 NOTE — Discharge Instructions (Signed)
Urgent needs - IR on call MD [phone]  Wound - May remove dressing and shower in 24 to 48 hours.  Keep site clean and dry.  Do not submerge in tub or water.  May use G tube (gastrostomy tube)  for meds immediately.  May use Saturday 09/27/19 otherwise (feedings, fluids etc).  May have clear liquids by mouth starting at 715pm tonite 09/26/19 (resume gradually).  May resume regular diet Saturday.  (avoid too much carbonated liquids)     Gastrostomy Tube Replacement, Care After This sheet gives you information about how to care for yourself after your procedure. Your health care provider may also give you more specific instructions. If you have problems or questions, contact your health care provider. What can I expect after the procedure? After the procedure, it is common to have:  Mild pain in your abdomen.  A small amount of blood-tinged fluid leaking from the replacement site. Follow these instructions at home:  You may resume your normal level of activity 24 hours following procedure (Sat 4pm)  You may resume your normal feedings, regular diet Saturday 09/27/19  Wash your hands before and after caring for your gastrostomy tube.  Starting Sunday (09/28/19), then daily:  Check the skin around the tube insertion site for redness, a rash, swelling, drainage, or extra tissue growth. If you notice any of these, call your health care provider.  Care for your gastrostomy tube as you did before, or as directed by your health care provider. Starting Sunday 09/28/19 and then daily for 7 days:   cleanse the insertion site with soap and water, allow to dry, dress with antibiotic oitment and cover with gauze and tape. Contact a health care provider if:  You have a fever or chills.  You have redness or irritation near the insertion site.  You continue to have pain in the abdomen or leaking around your gastrostomy tube. Get help right away if:  You develop bleeding or a lot of discharge around the  tube.  You have severe pain in the abdomen.  Your new tube is not working properly.  You are unable to get feedings into the tube.  Your tube comes out for any reason. Summary  Follow specific instructions as told by your health care provider. If you have problems or questions, contact your health care provider.  After the procedure you may resume your normal level of activity and normal feedings.  Care for your gastrostomy tube as you did before, or as directed by your health care provider. This information is not intended to replace advice given to you by your health care provider. Make sure you discuss any questions you have with your health care provider. Document Revised: 04/18/2017 Document Reviewed: 04/18/2017 Elsevier Patient Education  [place]. Gastrostomy Tube Home Guide, Adult A gastrostomy tube, or G-tube, is a tube that is inserted through the abdomen into the stomach. The tube is used to give feedings and medicines when a person is unable to eat and drink enough on his or her own. How to care for a G-tube (starting Sunday, as stated above) Supplies needed  Saline solution or clean, warm water and soap.  Cotton swab or gauze.  Precut gauze bandage (dressing) and tape, if needed. Instructions 1. Wash your hands with soap and water. 2. If there is a dressing between the person's skin and the tube, remove it. 3. Check the area where the tube enters the skin. Check for problems such as: ? Redness. ?  Swelling. ? Pus-like drainage. ? Extra skin growth. 4. Moisten the cotton swab with the saline solution or soap and water mixture. Gently clean around the insertion site. Remove any drainage or crusting. ? When the G-tube is first put in, a normal saline solution or water can be used to clean the skin. ? Mild soap and warm water can be used when the skin around the G-tube site has healed. 5. If there should be a dressing between the person's skin and the tube,  apply it at this time. How to flush a G-tube Flush the G-tube regularly to keep it from clogging. Flush it wit water before and after feedings and as often as told by the health care provider every 8 to 12 hours with at least 41ml water. Supplies needed  Purified or sterile water, warmed. If the person has a weak disease-fighting (immune) system, or if he or she has difficulty fighting off infections (is immunocompromised), use only sterile water. ? If you are unsure about the amount of chemical contaminants in purified or drinking water, use sterile water. ? To purify drinking water by boiling:  Boil water for at least 1 minute. Keep lid over water while it boils. Allow water to cool to room temperature before using.  60cc G-tube syringe. Instructions 1. Wash your hands with soap and water. 2. Draw up 20 -65mL of warm water in a syringe. 3. Connect the syringe to the tube. 4. Slowly and gently push the water into the tube. G-tube problems and solutions  If the tube comes out: ? Cover the opening with a clean dressing and tape. ? Call a health care provider right away. ? A health care provider will need to put the tube back in within 4 hours.  If there is skin or scar tissue growing where the tube enters the skin: ? Keep the area clean and dry. ? Secure the tube with tape so that the tube does not move around too much. ? Call a health care provider.  If the tube gets clogged: ? Slowly push warm water into the tube with a large syringe. ? Do not force the fluid into the tube or push an object into the tube. ? If you are not able to unclog the tube, call a health care provider right away. Follow these instructions at home: Feedings  Give feedings at room temperature.  Cover and place unused feedings in the refrigerator.  If feedings are continuous: ? Do not put more than 4 hours worth of feedings in the feeding bag. ? Stop the feedings when you need to give medicine or flush  the tube. Be sure to restart the feedings. ? Make sure the person's head is above his or her stomach (upright position). This will prevent choking and discomfort.  Replace feeding bags and syringes as told by the health care provider.  Make sure the person is in the right position during and after feedings: ? During feedings, the person's position should be in the upright position. ? After a noncontinuous feeding (bolus feeding), have the person stay in the upright position for 1 hour. General instructions  Only use syringes made for G-tubes.  Do not pull or put tension on the tube.  Clamp the tube before removing the cap or disconnecting a syringe.  Measure the length of the G-tube every day from the insertion site to the end of the tube.  If the person's G-tube has a balloon, check the fluid in the balloon  every week. The amount of fluid that should be in the balloon can be found in the manufacturer's specifications.  Make sure the person takes care of his or her oral health, such as by brushing his or her teeth.  Remove excess air from the G-tube as told by the person's health care provider. This is called "venting."  Keep the area where the tube enters the skin clean and dry.  Do not push feedings, medicines, or flushes rapidly. Contact a health care provider if:  The person with the tube has any of these problems: ? Constipation. ? Fever.  There is a large amount of fluid or mucus-like liquid leaking from the tube.  Skin or scar tissue appears to be growing where the tube enters the skin.  The length of tube from the insertion site to the G-tube gets longer. Get help right away if:  The person with the tube has any of these problems: ? Severe abdominal pain. ? Severe tenderness. ? Severe bloating. ? Nausea. ? Vomiting. ? Trouble breathing. ? Shortness of breath.  Any of these problems happen in the area where the tube enters the skin: ? Redness, irritation,  swelling, or soreness. ? Pus-like discharge. ? A bad smell.  The tube is clogged and cannot be flushed.  The tube comes out. Summary  A gastrostomy tube, or G-tube, is a tube that is inserted through the abdomen into the stomach. The tube is used to give feedings and medicines when a person is unable to eat and drink enough on his or her own.  Check and clean the insertion site daily as told by the person's health care provider.  Flush the G-tube regularly to keep it from clogging. Flush it before and after feedings and as often as told by the person's health care provider.  Keep the area where the tube enters the skin clean and dry. This information is not intended to replace advice given to you by your health care provider. Make sure you discuss any questions you have with your health care provider. Document Revised: 02/23/2017 Document Reviewed: 05/08/2016 Elsevier Patient Education  [place].  Moderate Conscious Sedation, Adult, Care After These instructions provide you with information about caring for yourself after your procedure. Your health care provider may also give you more specific instructions. Your treatment has been planned according to current medical practices, but problems sometimes occur. Call your health care provider if you have any problems or questions after your procedure. What can I expect after the procedure? After your procedure, it is common:  To feel sleepy for several hours.  To feel clumsy and have poor balance for several hours.  To have poor judgment for several hours.  To vomit if you eat too soon. Follow these instructions at home: For at least 24 hours after the procedure:   Do not: ? Participate in activities where you could fall or become injured. ? Drive. ? Use heavy machinery. ? Drink alcohol. ? Take sleeping pills or medicines that cause drowsiness. ? Make important decisions or sign legal documents. ? Take care of  children on your own.  Rest. Eating and drinking  Follow the diet recommended by your health care provider.  If you vomit: ? Drink water, juice, or soup when you can drink without vomiting. ? Make sure you have little or no nausea before eating solid foods. General instructions  Have a responsible adult stay with you until you are awake and alert.  Take over-the-counter  and prescription medicines only as told by your health care provider.  If you smoke, do not smoke without supervision.  Keep all follow-up visits as told by your health care provider. This is important. Contact a health care provider if:  You keep feeling nauseous or you keep vomiting.  You feel light-headed.  You develop a rash.  You have a fever. Get help right away if:  You have trouble breathing. This information is not intended to replace advice given to you by your health care provider. Make sure you discuss any questions you have with your health care provider. Document Revised: 02/23/2017 Document Reviewed: 07/03/2015 Elsevier Patient Education  2020 [employer].

## 2019-09-26 NOTE — Consult Note (Signed)
Chief Complaint: Patient was seen in consultation today for percutaneous gastrostomy tube placement  Referring Physician(s): [name]  Supervising Physician: Markus Daft  Patient Status: [place]  History of Present Illness: Eric Osborn. is a 74 y.o. male with history of metastatic prostate carcinoma, status prostatectomy 2014, who presents now with moderate to severe protein calorie malnutrition and evidence of progressive disease on PET scan.  He is scheduled today for gastrostomy tube placement to assist with nutritional needs.  Past Medical History:  Diagnosis Date  . Arthritis    left knee, left shoulder  . Erectile dysfunction 06/25/2012  . Foley catheter in place   . Goiter 06/25/2012  . H/O hiatal hernia   . Hematuria    occasional  . Hemorrhoid   . History of bladder infections   . Joint pain   . Prostate cancer [facility])     Past Surgical History:  Procedure Laterality Date  . INGUINAL HERNIA REPAIR  8 yrs ago  . LYMPHADENECTOMY Bilateral 11/20/2012   Procedure: WITH BILATERAL PELVIC LYMPH NODE DISSECTION ;  Surgeon: [name], MD;  Location: [facility];  Service: Urology;  Laterality: Bilateral;  . ROBOT ASSISTED LAPAROSCOPIC RADICAL PROSTATECTOMY N/A 11/20/2012   Procedure: ROBOTIC ASSISTED LAPAROSCOPIC RADICAL PROSTATECTOMY;  Surgeon: [name], MD;  Location: [facility];  Service: Urology;  Laterality: N/A;    Allergies: Heparin and Poison ivy extract [poison ivy extract]  Medications: Prior to Admission medications   Medication Sig Start Date End Date Taking? Authorizing Provider  dronabinol (MARINOL) 5 MG capsule Take 1 capsule (5 mg total) by mouth 2 (two) times daily before a meal. 08/26/19   [name], MD  leuprolide (LUPRON) 22.5 MG injection Inject 22.5 mg into the muscle every 3 (three) months.    [provider]  lidocaine (LIDODERM) 5 % Place 1 patch onto the skin daily. Apply to area of maximal pain over lower  back. Remove & Discard patch within 12 hours or as directed by MD 04/08/19   [name], MD  morphine (MS CONTIN) 60 MG 12 hr tablet Take 1 tablet (60 mg total) by mouth every 8 (eight) hours. 09/19/19   [name], MD  mupirocin ointment (BACTROBAN) 2 % Apply 1 application topically 4 (four) times daily. 05/07/18   Tanner, Lyndon Code., PA-C  Nutritional Supplements (FEEDING SUPPLEMENT, OSMOLITE 1.5 CAL,) LIQD Give 1 1/2 carton of osmolite 1.5 at 7:30am, 11:30am, 3:30pm and 7:30pm via feeding tube.  Flush with 9ml of water before and after each feeding.  Will need to drink or give via tube additional 252ml of water to better meet hydration needs.  Meets 100% of nutritional needs.  Send bolus supplies. 09/23/19   [name], MD  oxycodone (ROXICODONE) 30 MG immediate release tablet Take 1 tablet (30 mg total) by mouth every 4 (four) hours as needed for pain. 09/19/19   [name], MD  polyethylene glycol (MIRALAX) 17 g packet Take 17 g by mouth daily. 08/26/19   [name], MD  potassium chloride SA (KLOR-CON) 20 MEQ tablet Take 2 tablets (40 mEq total) by mouth 2 (two) times daily. 09/16/19   [name], MD  predniSONE (DELTASONE) 5 MG tablet TAKE 1 TABLET BY MOUTH ONCE DAILY WITH BREAKFAST 07/29/19   [name], MD  senna-docusate (SENNA S) 8.6-50 MG tablet Take 2 tablets by mouth 2 (two) times daily. 08/26/19   [name], MD  Vitamin D, Ergocalciferol, (DRISDOL) 1.25 MG (50000 UNIT) CAPS capsule Take 1 capsule (50,000 Units total) by mouth 2 (two) times a week. 05/08/19   [name], MD     Family History  Problem Relation Age of Onset  . Heart disease Sister   . Stroke Sister   . Colon cancer Paternal Uncle   . Cancer Daughter        unknown  . Prostate cancer Paternal Uncle   . Diabetes Neg Hx   . Breast cancer Neg Hx     Social History   Socioeconomic History  . Marital status: Divorced    Spouse name: Not on  file  . Number of children: 3  . Years of education: 9  . Highest education level: Not on file  Occupational History  . Occupation: Retired    Comment: textile  Tobacco Use  . Smoking status: Former Smoker    Packs/day: 0.25    Years: 25.00    Pack years: 6.25    Types: Cigarettes    Quit date: 03/27/2014    Years since quitting: 5.5  . Smokeless tobacco: Former Systems developer  . Tobacco comment: quit date 2016  Vaping Use  . Vaping Use: Never used  Substance and Sexual Activity  . Alcohol use: Not Currently    Comment: occasional beer   . Drug use: Yes    Types: Marijuana    Comment: 3 x a month uses marijuana  . Sexual activity: Not Currently  Other Topics Concern  . Not on file  Social History Narrative   Regular exercise-no   Caffeine Use-yes   Social Determinants of Health   Financial Resource Strain:   . Difficulty of Paying Living Expenses:   Food Insecurity:   . Worried About Charity fundraiser in the Last Year:   . Arboriculturist in the Last Year:   Transportation Needs:   . Film/video editor (Medical):   [name] Lack of Transportation (Non-Medical):   Physical Activity:   . Days of Exercise per Week:   . Minutes of Exercise per Session:   Stress:   . Feeling of Stress :   Social Connections:   . Frequency of Communication with Friends and Family:   . Frequency of Social Gatherings with Friends and Family:   . Attends Religious Services:   . Active Member of Clubs or Organizations:   . Attends Archivist Meetings:   [name] Marital Status:      Review of Systems denies fever, headache, worsening dyspnea, cough, abdominal pain, nausea, vomiting or bleeding.  He has had weight loss, occasional dysphagia, and back pain    Vital Signs: BP 111/80   Pulse 96   Temp 98.2 F (36.8 C) (Oral)   Resp 16   SpO2 94%   Physical Exam cachectic appearing black male ;awake, alert.  Chest clear to auscultation bilaterally anteriorly.  Heart with regular rate and  rhythm.  Abdomen soft, positive bowel sounds, nontender.  No lower extremity edema.  Imaging: No results found.  Labs:  CBC: Recent Labs    08/26/19 1121 09/01/19 0721 09/10/19 0925 09/23/19 0929  WBC 5.7 5.0 5.3 15.6*  HGB 9.8* 9.4* 9.8* 9.0*  HCT 32.8* 31.2* 32.7* 29.4*  PLT 369 503* 362 210    COAGS: No results for input(s): INR, APTT in the last 8760 hours.  BMP: Recent Labs    08/26/19 1121 09/01/19 0721 09/10/19 0925 09/23/19 0929  NA 139 140  141 140  K 3.5 3.2* 2.8* 4.1  CL 104 102 100 103  CO2 25 25 32 27  GLUCOSE 100* 91 116* 138*  BUN 8 8 10 17   CALCIUM 8.2* 8.1* 8.3* 9.1  CREATININE 0.54* 0.50* 0.57* 0.64  GFRNONAA >60 >60 >60 >60  GFRAA >60 >60 >60 >60    LIVER FUNCTION TESTS: Recent Labs    08/26/19 1121 09/01/19 0721 09/10/19 0925 09/23/19 0929  BILITOT 0.7 0.5 0.3 0.4  AST 73* 69* 106* 142*  ALT 6 6 <6 11  ALKPHOS 1,433* 1,072* 1,332* 1,562*  PROT 6.5 6.2* 5.9* 5.9*  ALBUMIN 2.4* 2.2* 2.5* 2.6*    TUMOR MARKERS: No results for input(s): AFPTM, CEA, CA199, CHROMGRNA in the last 8760 hours.  Assessment and Plan: 74 y.o. male with history of metastatic prostate carcinoma, status prostatectomy 2014, who presents now with moderate to severe protein calorie malnutrition and evidence of progressive disease on PET scan.  He is scheduled today for gastrostomy tube placement to assist with nutritional needs.Risks and benefits image guided gastrostomy tube placement was discussed with the patient including, but not limited to the need for a barium enema during the procedure, bleeding, infection, peritonitis and/or damage to adjacent structures.  All of the patient's questions were answered, patient is agreeable to proceed.  Consent signed and in chart.     Thank you for this interesting consult.  I greatly enjoyed meeting Azreal Stthomas. and look forward to participating in their care.  A copy of this report was sent to the requesting  provider on this date.  Electronically Signed: D. Rowe Robert, PA-C 09/26/2019, 1:15 PM   I spent a total of  25 minutes   in face to face in clinical consultation, greater than 50% of which was counseling/coordinating care for gastrostomy tube placement

## 2019-09-28 ENCOUNTER — Other Ambulatory Visit: Payer: Self-pay | Admitting: Radiology

## 2019-09-30 ENCOUNTER — Other Ambulatory Visit: Payer: Self-pay

## 2019-09-30 ENCOUNTER — Encounter (HOSPITAL_COMMUNITY): Payer: Self-pay

## 2019-09-30 ENCOUNTER — Inpatient Hospital Stay: Payer: Medicare Other | Admitting: Nutrition

## 2019-09-30 ENCOUNTER — Ambulatory Visit (HOSPITAL_COMMUNITY)
Admission: RE | Admit: 2019-09-30 | Discharge: 2019-09-30 | Disposition: A | Discharge status: Home / Self Care | Payer: Medicare Other | Source: Ambulatory Visit | Attending: Hematology | Admitting: Hematology

## 2019-09-30 DIAGNOSIS — C61 Malignant neoplasm of prostate: Secondary | ICD-10-CM | POA: Diagnosis not present

## 2019-09-30 DIAGNOSIS — Z8546 Personal history of malignant neoplasm of prostate: Secondary | ICD-10-CM | POA: Diagnosis not present

## 2019-09-30 DIAGNOSIS — C7951 Secondary malignant neoplasm of bone: Secondary | ICD-10-CM | POA: Insufficient documentation

## 2019-09-30 DIAGNOSIS — Z87891 Personal history of nicotine dependence: Secondary | ICD-10-CM | POA: Insufficient documentation

## 2019-09-30 DIAGNOSIS — Z79899 Other long term (current) drug therapy: Secondary | ICD-10-CM | POA: Insufficient documentation

## 2019-09-30 DIAGNOSIS — M549 Dorsalgia, unspecified: Secondary | ICD-10-CM | POA: Insufficient documentation

## 2019-09-30 DIAGNOSIS — G8929 Other chronic pain: Secondary | ICD-10-CM | POA: Diagnosis not present

## 2019-09-30 LAB — CBC WITH DIFFERENTIAL/PLATELET
Abs Immature Granulocytes: 1.69 10*3/uL — ABNORMAL HIGH (ref 0.00–0.07)
Basophils Absolute: 0.1 10*3/uL (ref 0.0–0.1)
Basophils Relative: 1 %
Eosinophils Absolute: 0.1 10*3/uL (ref 0.0–0.5)
Eosinophils Relative: 0 %
HCT: 30.9 % — ABNORMAL LOW (ref 39.0–52.0)
Hemoglobin: 9.4 g/dL — ABNORMAL LOW (ref 13.0–17.0)
Immature Granulocytes: 9 %
Lymphocytes Relative: 16 %
Lymphs Abs: 3.2 10*3/uL (ref 0.7–4.0)
MCH: 26.6 pg (ref 26.0–34.0)
MCHC: 30.4 g/dL (ref 30.0–36.0)
MCV: 87.3 fL (ref 80.0–100.0)
Monocytes Absolute: 1 10*3/uL (ref 0.1–1.0)
Monocytes Relative: 5 %
Neutro Abs: 13.7 10*3/uL — ABNORMAL HIGH (ref 1.7–7.7)
Neutrophils Relative %: 69 %
Platelets: 294 10*3/uL (ref 150–400)
RBC: 3.54 MIL/uL — ABNORMAL LOW (ref 4.22–5.81)
RDW: 24.6 % — ABNORMAL HIGH (ref 11.5–15.5)
WBC: 19.7 10*3/uL — ABNORMAL HIGH (ref 4.0–10.5)
nRBC: 1 % — ABNORMAL HIGH (ref 0.0–0.2)

## 2019-09-30 LAB — BASIC METABOLIC PANEL
Anion gap: 9 (ref 5–15)
BUN: 11 mg/dL (ref 8–23)
CO2: 28 mmol/L (ref 22–32)
Calcium: 8.2 mg/dL — ABNORMAL LOW (ref 8.9–10.3)
Chloride: 100 mmol/L (ref 98–111)
Creatinine, Ser: 0.37 mg/dL — ABNORMAL LOW (ref 0.61–1.24)
GFR calc Af Amer: >60 mL/min (ref >60)
GFR calc non Af Amer: >60 mL/min (ref >60)
Glucose, Bld: 95 mg/dL (ref 70–99)
Potassium: 4.1 mmol/L (ref 3.5–5.1)
Sodium: 137 mmol/L (ref 135–145)

## 2019-09-30 MED ORDER — FENTANYL CITRATE (PF) 100 MCG/2ML IJ SOLN
INTRAMUSCULAR | Status: AC
  Filled 2019-09-30: qty 2

## 2019-09-30 MED ORDER — MIDAZOLAM HCL 2 MG/2ML IJ SOLN
INTRAMUSCULAR | Status: AC
  Filled 2019-09-30: qty 4

## 2019-09-30 MED ORDER — MIDAZOLAM HCL 2 MG/2ML IJ SOLN
INTRAMUSCULAR | Status: AC | PRN
  Administered 2019-09-30: 1 mg via INTRAVENOUS
  Administered 2019-09-30: 0.5 mg via INTRAVENOUS

## 2019-09-30 MED ORDER — FENTANYL CITRATE (PF) 100 MCG/2ML IJ SOLN
INTRAMUSCULAR | Status: AC | PRN
  Administered 2019-09-30 (×2): 50 ug via INTRAVENOUS

## 2019-09-30 MED ORDER — LIDOCAINE HCL (PF) 1 % IJ SOLN
INTRAMUSCULAR | Status: AC | PRN
  Administered 2019-09-30: 10 mL

## 2019-09-30 MED ORDER — MORPHINE SULFATE (PF) 2 MG/ML IV SOLN
2.0000 mg | Freq: Once | INTRAVENOUS | Status: AC
  Administered 2019-09-30: 2 mg via INTRAMUSCULAR
  Filled 2019-09-30: qty 1

## 2019-09-30 MED ORDER — MORPHINE SULFATE (PF) 4 MG/ML IV SOLN
INTRAVENOUS | Status: AC
  Filled 2019-09-30: qty 1

## 2019-09-30 MED ORDER — SODIUM CHLORIDE 0.9 % IV SOLN: INTRAVENOUS | Status: DC

## 2019-09-30 NOTE — Progress Notes (Signed)
Nutrition follow-up completed with patient who has metastatic prostate cancer followed by Dr. Irene Limbo, status post PEG placement on Friday, July 2.  Patient had a 94 French gastrostomy tube placed without difficulty. Patient started using one half carton of Osmolite 1.5, 4 times daily with 60 mL of free water before and after each bolus feeding. He denies any issues with tolerance. He does complain of constipation and says he has been taking senna.  Patient's caregiver Freda Munro confirms patient has been taking senna, 2 tablets twice daily since Saturday.  Patient reports last BM was Saturday. Labs were reviewed.  Noted sodium 137, potassium 4.1, and glucose 95. Phosphorus and magnesium are pending. Last weight documented was 130 pounds July 2.  Estimated nutrition needs: 1800-2100 cal, 90-105 g protein, rater than 1.8 L fluid.  Nutrition diagnosis: Inadequate oral intake continues.  Intervention: Patient educated to increase Osmolite 1.5-1 carton 4 times daily with 60 mL of free water before and after bolus feeding. Patient will continue to drink water by mouth. Encourage patient to consume an additional 600 mL of free water. Await refeeding labs prior to increasing tube feeding. Tube feeding goal rate is Osmolite 1.5, 6 cartons daily providing 2130 cal and 89.4 gm protein, 1566 mL free water. Once tube feeding at goal patient may need additional protein to support healing.  Will evaluate. Instructions were written out for patient along with my contact information for questions. I discussed this with Freda Munro as well. Provided patient with 2 complementary cases of Osmolite 1.5 until Medicaid is effective and will pay for enteral feeding.  Monitoring, evaluation, goals: Patient will tolerate oral intake plus tube feeding to meet greater than 90% estimated needs to minimize weight loss and promote healing.  Next visit: Tuesday, July 20 during infusion with Jennet Maduro.  **Disclaimer: This note was  dictated with voice recognition software. Similar sounding words can inadvertently be transcribed and this note may contain transcription errors which may not have been corrected upon publication of note.**

## 2019-09-30 NOTE — Progress Notes (Addendum)
1220- pt states has back pain 5/10- chronic pain takes MS Contin every 8 hrs.  (also takes Morphine IM and Oxy PO - PRN ).   Called Dr. Laurence Ferrari to inform.   Orders received. 1249 Morphine 2 mg IM- right gluteal given.   Patient also has a 1345 appointment at [facility] with [place].   Called and asked if she could take pt. Earlier; as his time post IR was completed at 1230.  Pt. Assisted with clothing; IV out and taken to [place] at 1315 for appointment.  Called friend Adela Lank; and she is aware of discharge time and instructions post IR/Bone Biopsy.

## 2019-09-30 NOTE — Discharge Instructions (Signed)
There are no changes to your home medications.    Needle Biopsy, Care After These instructions tell you how to care for yourself after your procedure. Your doctor may also give you more specific instructions. Call your doctor if you have any problems or questions. What can I expect after the procedure? After the procedure, it is common to have:  Soreness.  Bruising.  Mild pain. Follow these instructions at home:   Return to your normal activities as told by your doctor. Ask your doctor what activities are safe for you.  Take over-the-counter and prescription medicines only as told by your doctor.  Wash your hands with soap and water before you change your bandage (dressing). If you cannot use soap and water, use hand sanitizer.  Follow instructions from your doctor about: ? How to take care of your puncture site. ? Remove your dressing tomorrow and shower around 1200 Noon.  Check your puncture site every day for signs of infection. Watch for: ? Redness, swelling, or pain. ? Fluid or blood. ? Pus or a bad smell. ? Warmth.  Do not take baths, swim, or use a hot tub until your doctor approves. You may shower tomorrow and remove band aide around 1200 Noon.  Keep all follow-up visits as told by your doctor. This is important. Contact a doctor if you have:  A fever.  Redness, swelling, or pain at the puncture site, and it lasts longer than a few days.  Fluid, blood, or pus coming from the puncture site.  Warmth coming from the puncture site. Get help right away if:  You have a lot of bleeding from the puncture site. Summary  After the procedure, it is common to have soreness, bruising, or mild pain at the puncture site.  Check your puncture site every day for signs of infection, such as redness, swelling, or pain.  Get help right away if you have severe bleeding from your puncture site. This information is not intended to replace advice given to you by your health  care provider. Make sure you discuss any questions you have with your health care provider. Document Revised: 03/26/2017 Document Reviewed: 03/26/2017 Elsevier Patient Education  [place].      Moderate Conscious Sedation, Adult, Care After These instructions provide you with information about caring for yourself after your procedure. Your health care provider may also give you more specific instructions. Your treatment has been planned according to current medical practices, but problems sometimes occur. Call your health care provider if you have any problems or questions after your procedure. What can I expect after the procedure? After your procedure, it is common:  To feel sleepy for several hours.  To feel clumsy and have poor balance for several hours.  To have poor judgment for several hours.  To vomit if you eat too soon. Follow these instructions at home: For at least 24 hours after the procedure:   Do not: ? Participate in activities where you could fall or become injured. ? Drive. ? Use heavy machinery. ? Drink alcohol. ? Take sleeping pills or medicines that cause drowsiness. ? Make important decisions or sign legal documents. ? Take care of children on your own.  Rest. Eating and drinking  Follow the diet recommended by your health care provider.  If you vomit: ? Drink water, juice, or soup when you can drink without vomiting. ? Make sure you have little or no nausea before eating solid foods. General instructions  Have a responsible  adult stay with you until you are awake and alert.  Take over-the-counter and prescription medicines only as told by your health care provider.  If you smoke, do not smoke without supervision.  Keep all follow-up visits as told by your health care provider. This is important. Contact a health care provider if:  You keep feeling nauseous or you keep vomiting.  You feel light-headed.  You develop a rash.  You  have a fever. Get help right away if:  You have trouble breathing. This information is not intended to replace advice given to you by your health care provider. Make sure you discuss any questions you have with your health care provider. Document Revised: 02/23/2017 Document Reviewed: 07/03/2015 Elsevier Patient Education  2020 [employer].

## 2019-09-30 NOTE — Procedures (Signed)
Interventional Radiology Procedure Note  Procedure: CT guided core biopsy, wight iliac wing mass.   Complications: None  Estimated Blood Loss: None  Recommendations: - DC home after 1 hr   Signed,  Criselda Peaches, MD

## 2019-09-30 NOTE — H&P (Signed)
Chief Complaint: Patient was seen in consultation today for right iliac soft tissue biopsy.  Referring Physician(s): [name]  Supervising Physician: Jacqulynn Cadet  Patient Status: [facility] - Out-pt  History of Present Illness: Eric Osborn. is a 74 y.o. male with a past medical history significant for arthritis, prostate cancer s/p prostatectomy, urinary retention s/p foley placement, malnutrition s/p g-tube placement 09/26/19 in IR who presents today for a right iliac soft tissue biopsy. Eric Osborn was first diagnosed with prostate cancer in 2014 and underwent a prostatectomy. He has been followed by oncology since that time and underwent a PET scan on 08/21/19 due to elevated PSA. PET scan showed progressive prostate cancer skeletal metastasis with progression and periosteal reaction noted in the left and right iliac fossa, posterior right 9th rib and right shoulder girdle. IR has been asked to perform a biopsy of the right iliac soft tissue component to further direct care.  Eric Osborn states he has pain all over today which is not unusual for him and rates it about a 5 out of 10. He has been trying to eat some solid foods but has a hard time swallowing them so he has been mostly using his g-tube for nutrition which has been working well. He is still able to swallow liquids and pills without much difficulty. He is wondering if he will need different treatment for these new lesions and when that will happen - we discussed that today is just part of the planning process and will help his team determine what treatments, if any, he is eligible for which he understands. He went to church for the 4th of July which he is happy about. He states understanding of the procedure today and is agreeable to proceed as planned.   Past Medical History:  Diagnosis Date  . Arthritis    left knee, left shoulder  . Erectile dysfunction 06/25/2012  . Foley catheter in place   . Goiter 06/25/2012  .  H/O hiatal hernia   . Hematuria    occasional  . Hemorrhoid   . History of bladder infections   . Joint pain   . Prostate cancer [facility])     Past Surgical History:  Procedure Laterality Date  . INGUINAL HERNIA REPAIR  8 yrs ago  . IR GASTROSTOMY TUBE MOD SED  09/26/2019  . LYMPHADENECTOMY Bilateral 11/20/2012   Procedure: WITH BILATERAL PELVIC LYMPH NODE DISSECTION ;  Surgeon: [name], MD;  Location: [facility] ORS;  Service: Urology;  Laterality: Bilateral;  . ROBOT ASSISTED LAPAROSCOPIC RADICAL PROSTATECTOMY N/A 11/20/2012   Procedure: ROBOTIC ASSISTED LAPAROSCOPIC RADICAL PROSTATECTOMY;  Surgeon: [name], MD;  Location: [facility] ORS;  Service: Urology;  Laterality: N/A;    Allergies: Heparin and Poison ivy extract [poison ivy extract]  Medications: Prior to Admission medications   Medication Sig Start Date End Date Taking? Authorizing Provider  dronabinol (MARINOL) 5 MG capsule Take 1 capsule (5 mg total) by mouth 2 (two) times daily before a meal. 08/26/19  Yes [name], MD  leuprolide (LUPRON) 22.5 MG injection Inject 22.5 mg into the muscle every 3 (three) months.   Yes [provider]  morphine (MS CONTIN) 60 MG 12 hr tablet Take 1 tablet (60 mg total) by mouth every 8 (eight) hours. 09/19/19  Yes [name], MD  Nutritional Supplements (FEEDING SUPPLEMENT, OSMOLITE 1.5 CAL,) LIQD Give 1 1/2 carton of osmolite 1.5 at 7:30am, 11:30am, 3:30pm and 7:30pm via feeding tube.  Flush  with 20ml of water before and after each feeding.  Will need to drink or give via tube additional 266ml of water to better meet hydration needs.  Meets 100% of nutritional needs.  Send bolus supplies. 09/23/19  Yes [name], MD  oxycodone (ROXICODONE) 30 MG immediate release tablet Take 1 tablet (30 mg total) by mouth every 4 (four) hours as needed for pain. 09/19/19  Yes [name], MD  polyethylene glycol (MIRALAX) 17 g packet Take 17 g by mouth daily. 08/26/19  Yes  [name], MD  potassium chloride SA (KLOR-CON) 20 MEQ tablet Take 2 tablets (40 mEq total) by mouth 2 (two) times daily. 09/16/19  Yes [name], MD  predniSONE (DELTASONE) 5 MG tablet TAKE 1 TABLET BY MOUTH ONCE DAILY WITH BREAKFAST 07/29/19  Yes [name], MD  senna-docusate (SENNA S) 8.6-50 MG tablet Take 2 tablets by mouth 2 (two) times daily. 08/26/19  Yes [name], MD  Vitamin D, Ergocalciferol, (DRISDOL) 1.25 MG (50000 UNIT) CAPS capsule Take 1 capsule (50,000 Units total) by mouth 2 (two) times a week. 05/08/19  Yes [name], MD  lidocaine (LIDODERM) 5 % Place 1 patch onto the skin daily. Apply to area of maximal pain over lower back. Remove & Discard patch within 12 hours or as directed by MD 04/08/19   [name], MD  mupirocin ointment (BACTROBAN) 2 % Apply 1 application topically 4 (four) times daily. 05/07/18   Harle Stanford., PA-C     Family History  Problem Relation Age of Onset  . Heart disease Sister   . Stroke Sister   . Colon cancer Paternal Uncle   . Cancer Daughter        unknown  . Prostate cancer Paternal Uncle   . Diabetes Neg Hx   . Breast cancer Neg Hx     Social History   Socioeconomic History  . Marital status: Divorced    Spouse name: Not on file  . Number of children: 3  . Years of education: 9  . Highest education level: Not on file  Occupational History  . Occupation: Retired    Comment: textile  Tobacco Use  . Smoking status: Former Smoker    Packs/day: 0.25    Years: 25.00    Pack years: 6.25    Types: Cigarettes    Quit date: 03/27/2014    Years since quitting: 5.5  . Smokeless tobacco: Former Systems developer  . Tobacco comment: quit date 2016  Vaping Use  . Vaping Use: Never used  Substance and Sexual Activity  . Alcohol use: Not Currently    Comment: occasional beer   . Drug use: Yes    Types: Marijuana    Comment: 3 x a month uses marijuana  . Sexual activity: Not Currently  Other  Topics Concern  . Not on file  Social History Narrative   Regular exercise-no   Caffeine Use-yes   Social Determinants of Health   Financial Resource Strain:   . Difficulty of Paying Living Expenses:   Food Insecurity:   . Worried About Charity fundraiser in the Last Year:   . Arboriculturist in the Last Year:   Transportation Needs:   . Film/video editor (Medical):   [name] Lack of Transportation (Non-Medical):   Physical Activity:   . Days of Exercise per Week:   . Minutes of Exercise per Session:   Stress:   . Feeling of  Stress :   Social Connections:   . Frequency of Communication with Friends and Family:   . Frequency of Social Gatherings with Friends and Family:   . Attends Religious Services:   . Active Member of Clubs or Organizations:   . Attends Archivist Meetings:   [name] Marital Status:      Review of Systems: A 12 point ROS discussed and pertinent positives are indicated in the HPI above.  All other systems are negative.  Review of Systems  Constitutional: Positive for appetite change. Negative for chills and fever.  Respiratory: Negative for cough and shortness of breath.   Cardiovascular: Negative for chest pain.  Gastrointestinal: Positive for constipation. Negative for abdominal pain, nausea and vomiting.  Musculoskeletal: Positive for arthralgias, back pain and myalgias.  Neurological: Negative for dizziness and headaches.    Vital Signs: BP 116/81 (BP Location: Right Arm)   Pulse 87   Temp 97.8 F (36.6 C) (Oral)   SpO2 97%   Physical Exam Vitals reviewed.  Constitutional:      General: He is not in acute distress.    Appearance: He is ill-appearing.     Comments: Cachectic. Pleasant, talkative.  HENT:     Head: Normocephalic.     Mouth/Throat:     Mouth: Mucous membranes are moist.     Pharynx: Oropharynx is clear. No oropharyngeal exudate or posterior oropharyngeal erythema.  Cardiovascular:     Rate and Rhythm: Normal rate and  regular rhythm.  Pulmonary:     Effort: Pulmonary effort is normal.     Breath sounds: Normal breath sounds.  Abdominal:     General: There is no distension.     Palpations: Abdomen is soft.     Tenderness: There is no abdominal tenderness.     Comments: (+) g tube insertion site unremarkable  Skin:    General: Skin is warm and dry.  Neurological:     Mental Status: He is alert and oriented to person, place, and time.  Psychiatric:        Mood and Affect: Mood normal.        Behavior: Behavior normal.        Thought Content: Thought content normal.        Judgment: Judgment normal.      MD Evaluation Airway: WNL Heart: WNL Abdomen: WNL Chest/ Lungs: WNL ASA  Classification: 3 Mallampati/Airway Score: Two   Imaging: IR Gastrostomy Tube  Result Date: 09/26/2019 INDICATION: 74 year old with metastatic prostate cancer and needs nutritional support. EXAM: PERCUTANEOUS GASTROSTOMY TUBE WITH FLUOROSCOPIC GUIDANCE Physician: Stephan Minister. Anselm Pancoast, MD MEDICATIONS: Ancef 2 g; Antibiotics were administered within 1 hour of the procedure. Glucagon 0.5 mg IV ANESTHESIA/SEDATION: Versed 2.0 mg IV; Fentanyl 100 mcg IV Moderate Sedation Time:  13 minutes The patient was continuously monitored during the procedure by the interventional radiology nurse under my direct supervision. FLUOROSCOPY TIME:  Fluoroscopy Time: 6 minutes, 27 mGy COMPLICATIONS: None immediate. PROCEDURE: The procedure was explained to the patient. The risks and benefits of the procedure were discussed and the patient's questions were addressed. Informed consent was obtained from the patient. The patient was placed on the interventional table. Fluoroscopy demonstrated air in the transverse colon. An orogastric tube was placed with fluoroscopic guidance. The anterior abdomen was prepped and draped in sterile fashion. Maximal barrier sterile technique was utilized including caps, mask, sterile gowns, sterile gloves, sterile drape, hand  hygiene and skin antiseptic. Stomach was inflated with air through the orogastric tube.  The skin and subcutaneous tissues were anesthetized with 1% lidocaine. A 17 gauge needle was directed into the distended stomach with fluoroscopic guidance. A wire was advanced into the stomach and a T-fastener was deployed. A 9-French sheath was placed and the orogastric tube was snared using a Gooseneck snare device. The orogastric tube and snare were pulled out of the patient's mouth. The snare device was connected to a 20-French gastrostomy tube. The snare device and gastrostomy tube were pulled through the patient's mouth and out the anterior abdominal wall. The gastrostomy tube was cut to an appropriate length. Contrast injection through gastrostomy tube confirmed placement within the stomach. Fluoroscopic images were obtained for documentation. The gastrostomy tube was flushed with normal saline. FINDINGS: Stomach was adequately distended with gas and a safe percutaneous window was identified. 9 French gastrostomy tube was placed in the stomach and placement confirmed with contrast injection. Patient developed tachycardia just prior to the procedure. Patient's heart rate was in the 130s. Following the procedure, the patient's heart rate came back into the 90s. Patient's blood pressure remained stable throughout the procedure. IMPRESSION: Successful fluoroscopic guided percutaneous gastrostomy tube placement. Electronically Signed   By: Markus Daft M.D.   On: 09/26/2019 16:26    Labs:  CBC: Recent Labs    09/10/19 0925 09/23/19 0929 09/26/19 1315 09/30/19 0930  WBC 5.3 15.6* 49.1* 19.7*  HGB 9.8* 9.0* 8.3* 9.4*  HCT 32.7* 29.4* 27.4* 30.9*  PLT 362 210 230 294    COAGS: Recent Labs    09/26/19 1315  INR 1.1    BMP: Recent Labs    08/26/19 1121 09/01/19 0721 09/10/19 0925 09/23/19 0929  NA 139 140 141 140  K 3.5 3.2* 2.8* 4.1  CL 104 102 100 103  CO2 25 25 32 27  GLUCOSE 100* 91 116* 138*    BUN 8 8 10 17   CALCIUM 8.2* 8.1* 8.3* 9.1  CREATININE 0.54* 0.50* 0.57* 0.64  GFRNONAA >60 >60 >60 >60  GFRAA >60 >60 >60 >60    LIVER FUNCTION TESTS: Recent Labs    08/26/19 1121 09/01/19 0721 09/10/19 0925 09/23/19 0929  BILITOT 0.7 0.5 0.3 0.4  AST 73* 69* 106* 142*  ALT 6 6 <6 11  ALKPHOS 1,433* 1,072* 1,332* 1,562*  PROT 6.5 6.2* 5.9* 5.9*  ALBUMIN 2.4* 2.2* 2.5* 2.6*    TUMOR MARKERS: No results for input(s): AFPTM, CEA, CA199, CHROMGRNA in the last 8760 hours.  Assessment and Plan:  74 y/o M with history of prostate cancer s/p prostatectomy found to have progression of disease on most recent PET scan who presents today for a right iliac soft tissue component biopsy to further direct care.  Patient has been NPO since midnight, no current anticoagulation/anitplatelet medications. Afebrile, WBC 19.7, hgb 9.4, plt 294, INR 1.1, creatinine 0.37.  Risks and benefits of right iliac soft tissue component biopsy was discussed with the patient and/or patient's family including, but not limited to bleeding, infection, damage to adjacent structures or low yield requiring additional tests.  All of the questions were answered and there is agreement to proceed.  Consent signed and in chart.   Thank you for this interesting consult.  I greatly enjoyed meeting Kyrese Gartman. and look forward to participating in their care.  A copy of this report was sent to the requesting provider on this date.  Electronically Signed: Joaquim Nam, PA-C 09/30/2019, 10:06 AM   I spent a total of  25 Minutes in face to  face in clinical consultation, greater than 50% of which was counseling/coordinating care for right iliac soft tissue component biopsy.

## 2019-10-02 LAB — SURGICAL PATHOLOGY

## 2019-10-03 ENCOUNTER — Telehealth: Payer: Self-pay | Admitting: *Deleted

## 2019-10-03 NOTE — Telephone Encounter (Signed)
Contacted by phone and fax from Armen Pickup PT, with [facility]. Patient will benefit from assistive device/walker. Received faxed order form for same. Signed by Dr. Irene Limbo and faxed to [facility], fax confirmation received

## 2019-10-06 ENCOUNTER — Telehealth: Payer: Self-pay | Admitting: *Deleted

## 2019-10-06 NOTE — Telephone Encounter (Signed)
Faxed signed MD orders for DME/Wallker  to [facility]  Fax confirmation received

## 2019-10-06 NOTE — Telephone Encounter (Signed)
Audrea Muscat, RN with [facility] [phone] to coordinate labs needed for TF. Per Burnice Logan, RD with [facility], labs needed 2 x week for first 2 weeks of TF initially, with evaluation to determine interval schedule following first 2 weeks. Home Health referral/orders faxed to [facility] at [phone] per Dr. Irene Limbo: Labs twice week x 2 weeks (labs specified) and once per week for next 2 weeks. Fax confirmation received.  Monica contacted office after receiving orders and stated that labs will be done 7/13 and 7/16 this week, 7/19 and 7/23 next week; and next 2 Mondays following. The lab results will be faxed to Dr. Grier Mitts office [phone]

## 2019-10-08 ENCOUNTER — Other Ambulatory Visit: Payer: Self-pay | Admitting: Hematology

## 2019-10-08 MED ORDER — K-PHOS-NEUTRAL 155-852-130 MG PO TABS: 1.0000 | ORAL_TABLET | Freq: Three times a day (TID) | ORAL | 0 refills | Status: AC

## 2019-10-09 ENCOUNTER — Telehealth: Payer: Self-pay | Admitting: *Deleted

## 2019-10-09 NOTE — Telephone Encounter (Signed)
Dr. Irene Limbo ordered K Phos Mono-Sod Phos Sharma Covert & Mono (K-PHOS-NEUTRAL) [phone] MG TABS, Take 1 tablet 3 times daily.  Contacted patient to inform him new prescription had been sent to [place]. He verbalized understanding and states he will ask someone to pick it up for him. Notified [place] RN  of info regarding new medication. She states the visiting RN will check to follow up to make sure patient has medication at home.  She also shared following: Patient's heart rate was 134 on arrival for appt after answering the door, decreased to 107 after sitting down. HR 152 with PT. Dr.Kale informed of heart rate changes.

## 2019-10-10 ENCOUNTER — Other Ambulatory Visit: Payer: Self-pay

## 2019-10-10 ENCOUNTER — Ambulatory Visit
Admission: RE | Admit: 2019-10-10 | Discharge: 2019-10-10 | Disposition: A | Discharge status: Home / Self Care | Payer: Medicare Other | Source: Ambulatory Visit | Attending: Radiation Oncology | Admitting: Radiation Oncology

## 2019-10-10 ENCOUNTER — Encounter: Payer: Self-pay | Admitting: Urology

## 2019-10-10 ENCOUNTER — Ambulatory Visit
Admission: RE | Admit: 2019-10-10 | Discharge: 2019-10-10 | Disposition: A | Discharge status: Home / Self Care | Payer: Medicare Other | Source: Ambulatory Visit | Attending: Urology | Admitting: Urology

## 2019-10-10 DIAGNOSIS — C7951 Secondary malignant neoplasm of bone: Secondary | ICD-10-CM | POA: Insufficient documentation

## 2019-10-10 DIAGNOSIS — C61 Malignant neoplasm of prostate: Secondary | ICD-10-CM | POA: Diagnosis not present

## 2019-10-10 NOTE — Patient Instructions (Signed)
Coronavirus (COVID-19) Are you at risk?  Are you at risk for the Coronavirus (COVID-19)?  To be considered HIGH RISK for Coronavirus (COVID-19), you have to meet the following criteria:  . Traveled to [place], [place], [place], [place] or [place]; or in the [place] to [place], [place], [place], or [place]; and have fever, cough, and shortness of breath within the last 2 weeks of travel OR . Been in close contact with a person diagnosed with COVID-19 within the last 2 weeks and have fever, cough, and shortness of breath . IF YOU DO NOT MEET THESE CRITERIA, YOU ARE CONSIDERED LOW RISK FOR COVID-19.  What to do if you are HIGH RISK for COVID-19?  . If you are having a medical emergency, call 911. . Seek medical care right away. Before you go to a doctor's office, urgent care or emergency department, call ahead and tell them about your recent travel, contact with someone diagnosed with COVID-19, and your symptoms. You should receive instructions from your physician's office regarding next steps of care.  . When you arrive at healthcare provider, tell the healthcare staff immediately you have returned from visiting [place], [place], [place], [place] or [place]; or traveled in the [place] to [place], [place], [place], or [place]; in the last two weeks or you have been in close contact with a person diagnosed with COVID-19 in the last 2 weeks.   . Tell the health care staff about your symptoms: fever, cough and shortness of breath. . After you have been seen by a medical provider, you will be either: o Tested for (COVID-19) and discharged home on quarantine except to seek medical care if symptoms worsen, and asked to  - Stay home and avoid contact with others until you get your results (4-5 days)  - Avoid travel on public transportation if possible (such as bus, train, or airplane) or o Sent to the Emergency Department by EMS for evaluation, COVID-19 testing, and possible  admission depending on your condition and test results.  What to do if you are LOW RISK for COVID-19?  Reduce your risk of any infection by using the same precautions used for avoiding the common cold or flu:  . Wash your hands often with soap and warm water for at least 20 seconds.  If soap and water are not readily available, use an alcohol-based hand sanitizer with at least 60% alcohol.  . If coughing or sneezing, cover your mouth and nose by coughing or sneezing into the elbow areas of your shirt or coat, into a tissue or into your sleeve (not your hands). . Avoid shaking hands with others and consider head nods or verbal greetings only. . Avoid touching your eyes, nose, or mouth with unwashed hands.  . Avoid close contact with people who are sick. . Avoid places or events with large numbers of people in one location, like concerts or sporting events. . Carefully consider travel plans you have or are making. . If you are planning any travel outside or inside the [place], visit the CDC's Travelers' Health webpage for the latest health notices. . If you have some symptoms but not all symptoms, continue to monitor at home and seek medical attention if your symptoms worsen. . If you are having a medical emergency, call 911.   ADDITIONAL HEALTHCARE OPTIONS FOR PATIENTS  [facility] / e-Visit: [email or web address]         [facility]: [phone]  [facility] [place]   [facility]: [phone]                   [facility]: [phone]   

## 2019-10-10 NOTE — Progress Notes (Signed)
Radiation Oncology         [phone] ________________________________  Follow up New Outpatient Consultation - Conducted via Telephone due to current COVID-19 concerns for limiting patient exposure  Name: Eric Osborn. MRN: [id number]  Date: 10/10/2019  DOB: [birth year]  UD:PXSDC, Salvadore Oxford, NP  Eric Maine, MD   REFERRING PHYSICIAN: Johney Maine, MD  DIAGNOSIS: 74 y.o. 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    ICD-10-CM   1. Bone metastases (HCC)  C79.51     HISTORY OF PRESENT ILLNESS: Eric Osborn. is a 74 y.o. male with a diagnosis of progressive, castrate resistant metastatic prostate cancer. He was initially diagnosed with prostate cancer in 2014 by Dr. Isabel Caprice, treated with RALP on 11/20/2012, with pathology revealing: Gleason 4+3 prostate cancer with tertiary pattern 5, extending to left margins at multiple foci, involving cauterized bladder neck margin, extraprostatic extension, angiolymphatic invasion, and seminal vesicle involvement. Negative lymph nodes (0/5). He reports he received a year of Lupron ADT and no adjuvant radiation therapy. He was then lost to follow up thereafter.  He was noted to have developed advanced metastatic prostate cancer after he presented to the ED on 05/03/2016 with worsening mid and low back pain, as well as a 15-20 lb weight loss over 6-8 weeks and hematuria for 10 days. CT A/P performed at that time revealed innumerable sclerotic lesions in all imaged bones, an irregular appearance of the prostate fossa with a few enlarged retroperitoneal lymph nodes and thickened transverse colon walls.  He was referred to Dr. Candise Che on 05/08/2016 and a PSA performed that day was significantly elevated at 549 with an ALK of 2221. CT Chest and bone scan were performed on 05/12/2016 to complete his disease staging and revealed mediastinal lymphadenopathy and extensive osseous  metastases throughout the spine and bilaterally in the calvarium. He was treated with 6 cycles of taxotere, in addition to [place] every 4 weeks and Lupron every 3 months. His Rivka Barbara was held briefly for dental extractions, but this was resumed after he had recovered from his procedure.  He had a good response to treatment early on with a decreased PSA of 7.1 in 07/14/2016 with a nadir of 0.6 in 10/2016. Unfortunately, the PSA began rising again, up to 3.9 in 04/2017, 14.9 in 07/2017 and 49 in 10/2017.  He had a PET scan in October 2019 which showed diffuse bone metastases so he was started on Xtandi with a good response until March 2020 when his PSA began rising again.  His PSA had increased again to 14.2 in October 2020, so the Diana Eves was discontinued and he was switched to [place] in 01/2019.  A repeat PET scan performed on 01/30/2019 revealed an increase in robust periosteal reaction involving the left scapula and clavicle as well as the right hemipelvis with persistent peripheral radiotracer activity and additional foci of sclerosis in pelvis and spine.  There appeared to be a positive response within the lymph nodes.  Repeat PSA on 03/04/2019 was stable at 13.8.  His bone directed therapy was switched from Xgeva to Zometa on 04/08/2019. At that visit, he reported severe pain in his lumbar spine/sacrum that was no longer controlled with the Percocet he had been taking. He proceeded to lumbar spine MRI on 04/10/2019 which revealed diffuse sclerotic metastatic disease without compression deformity; circumferential epidural disease at L3 resulting in severe canal stenosis with extension into the L3-4 neural foramina and sacral epidural disease  with involvement of the S2 foramina. Sacral MRI also performed that same day revealed epidural tumor anteriorly in the sacral spinal canal at S2 measuring 2.7 cm, with mass effect on the S2 nerve roots and below with extensive multifocal bony metastases similar to prior studies  with expansile lesions in right iliac bone and right ischium and extraosseous extension of tumor adjacent to the right iliac bone.   We met with him initially on 04/11/2019 to discuss palliative radiation to the painful sites of osseous metastatic disease.  He elected to proceed with a 2-week course of palliative radiotherapy targeting L2-S3 which was completed on 04/28/2019.  He did note improvement in the low back pain but never had full resolution.  The back pain has remained stable.  More recently, he has been having significant pain in his left and right shoulders, a right rib, and his mid back. He reports that he is having the most issue with his left shoulder.  His PSA has continued to rise at 248 on 07/29/2019 up to 474 on 09/01/2019 and most recently, up to 1282 on 09/23/2019.  A PET scan on 08/21/2019 revealed progression of the prostate cancer skeletal metastases, particularly in the LEFT and RIGHT iliac fossa, posterior RIGHT ninth rib and RIGHT shoulder girdle with persistent multifocal skeletal metastasis elsewhere in the axillary and appendicular skeleton. No evidence of prostate cancer nodal metastasis, liver metastasis or pulmonary metastasis.  A CT guided biopsy ([id number]) of the right iliac wing lesion was performed on 09/30/2019 by Dr. Laurence Ferrari.  Final pathology was consistent with metastatic prostate carcinoma.   The patient reviewed the PET scan results with Dr. Irene Limbo and he has kindly been referred today for discussion of potential palliative radiation treatment to the painful skeletal lesions.   PREVIOUS RADIATION THERAPY: Yes  04/15/19-04/28/19:30 Gy in 10 fractions to L2-S3 spine  PAST MEDICAL HISTORY:  Past Medical History:  Diagnosis Date  . Arthritis    left knee, left shoulder  . Erectile dysfunction 06/25/2012  . Foley catheter in place   . Goiter 06/25/2012  . H/O hiatal hernia   . Hematuria    occasional  . Hemorrhoid   . History of bladder infections   . Joint  pain   . Prostate cancer ([place])       PAST SURGICAL HISTORY: Past Surgical History:  Procedure Laterality Date  . INGUINAL HERNIA REPAIR  8 yrs ago  . IR GASTROSTOMY TUBE MOD SED  09/26/2019  . LYMPHADENECTOMY Bilateral 11/20/2012   Procedure: WITH BILATERAL PELVIC LYMPH NODE DISSECTION ;  Surgeon: [name], MD;  Location: [facility];  Service: Urology;  Laterality: Bilateral;  . ROBOT ASSISTED LAPAROSCOPIC RADICAL PROSTATECTOMY N/A 11/20/2012   Procedure: ROBOTIC ASSISTED LAPAROSCOPIC RADICAL PROSTATECTOMY;  Surgeon: [name], MD;  Location: [facility];  Service: Urology;  Laterality: N/A;    FAMILY HISTORY:  Family History  Problem Relation Age of Onset  . Heart disease Sister   . Stroke Sister   . Colon cancer Paternal Uncle   . Cancer Daughter        unknown  . Prostate cancer Paternal Uncle   . Diabetes Neg Hx   . Breast cancer Neg Hx     SOCIAL HISTORY:  Social History   Socioeconomic History  . Marital status: Divorced    Spouse name: Not on file  . Number of children: 3  . Years of education: 9  . Highest education level: Not on file  Occupational History  .  Occupation: Retired    Comment: textile  Tobacco Use  . Smoking status: Former Smoker    Packs/day: 0.25    Years: 25.00    Pack years: 6.25    Types: Cigarettes    Quit date: 03/27/2014    Years since quitting: 5.5  . Smokeless tobacco: Former Systems developer  . Tobacco comment: quit date 2016  Vaping Use  . Vaping Use: Never used  Substance and Sexual Activity  . Alcohol use: Not Currently    Comment: occasional beer   . Drug use: Yes    Types: Marijuana    Comment: 3 x a month uses marijuana  . Sexual activity: Not Currently  Other Topics Concern  . Not on file  Social History Narrative   Regular exercise-no   Caffeine Use-yes   Social Determinants of Health   Financial Resource Strain:   . Difficulty of Paying Living Expenses:   Food Insecurity:   . Worried About Charity fundraiser in the Last  Year:   . Arboriculturist in the Last Year:   Transportation Needs:   . Film/video editor (Medical):   [name] Lack of Transportation (Non-Medical):   Physical Activity:   . Days of Exercise per Week:   . Minutes of Exercise per Session:   Stress:   . Feeling of Stress :   Social Connections:   . Frequency of Communication with Friends and Family:   . Frequency of Social Gatherings with Friends and Family:   . Attends Religious Services:   . Active Member of Clubs or Organizations:   . Attends Archivist Meetings:   [name] Marital Status:   Intimate Partner Violence:   . Fear of Current or Ex-Partner:   . Emotionally Abused:   [name] Physically Abused:   . Sexually Abused:     ALLERGIES: Heparin and Poison ivy extract [poison ivy extract]  MEDICATIONS:  Current Outpatient Medications  Medication Sig Dispense Refill  . dronabinol (MARINOL) 5 MG capsule Take 1 capsule (5 mg total) by mouth 2 (two) times daily before a meal. 60 capsule 0  . leuprolide (LUPRON) 22.5 MG injection Inject 22.5 mg into the muscle every 3 (three) months.    . morphine (MS CONTIN) 60 MG 12 hr tablet Take 1 tablet (60 mg total) by mouth every 8 (eight) hours. 90 tablet 0  . Nutritional Supplements (FEEDING SUPPLEMENT, OSMOLITE 1.5 CAL,) LIQD Give 1 1/2 carton of osmolite 1.5 at 7:30am, 11:30am, 3:30pm and 7:30pm via feeding tube.  Flush with 18m of water before and after each feeding.  Will need to drink or give via tube additional 2467mof water to better meet hydration needs.  Meets 100% of nutritional needs.  Send bolus supplies. 1422 mL 0  . oxycodone (ROXICODONE) 30 MG immediate release tablet Take 1 tablet (30 mg total) by mouth every 4 (four) hours as needed for pain. 180 tablet 0  . polyethylene glycol (MIRALAX) 17 g packet Take 17 g by mouth daily. 30 each 1  . predniSONE (DELTASONE) 5 MG tablet TAKE 1 TABLET BY MOUTH ONCE DAILY WITH BREAKFAST 30 tablet 2  . senna-docusate (SENNA S) 8.6-50 MG tablet  Take 2 tablets by mouth 2 (two) times daily. 120 tablet 1  . Vitamin D, Ergocalciferol, (DRISDOL) 1.25 MG (50000 UNIT) CAPS capsule Take 1 capsule (50,000 Units total) by mouth 2 (two) times a week. 24 capsule 6  . K Phos Mono-Sod Phos Di & Mono (K-PHOS-NEUTRAL) [id number]  TABS Take 1 tablet by mouth 3 (three) times daily. (Patient not taking: Reported on 10/10/2019) 120 tablet 0  . lidocaine (LIDODERM) 5 % Place 1 patch onto the skin daily. Apply to Osborn of maximal pain over lower back. Remove & Discard patch within 12 hours or as directed by MD (Patient not taking: Reported on 10/10/2019) 30 patch 0  . mupirocin ointment (BACTROBAN) 2 % Apply 1 application topically 4 (four) times daily. (Patient not taking: Reported on 10/10/2019) 30 g 0  . potassium chloride SA (KLOR-CON) 20 MEQ tablet Take 2 tablets (40 mEq total) by mouth 2 (two) times daily. (Patient not taking: Reported on 10/10/2019) 120 tablet 2   Current Facility-Administered Medications  Medication Dose Route Frequency Provider Last Rate Last Admin  . morphine 4 MG/ML injection 2 mg  2 mg Intramuscular Once Bruning, Ashlyn, PA-C        REVIEW OF SYSTEMS:  On review of systems, the patient reports that he is doing fairly overall. He denies any chest pain, shortness of breath, cough, fevers, chills, or night sweats.  The patient reports severe dry mouth and dysphagia, feeling like his throat tries to close up when he is eating which has therefore led to unintended weight loss and protein-calorie malnutrition despite an adequate appetite. He struggles with constipation associated with chronic narcotic pain medications, but currently denies abdominal pain, nausea or vomiting. He denies any new musculoskeletal or joint aches or pains but has had progressive pain in his shoulders, left greater than right, and right rib cage which is no longer well controlled with narcotic pain medications.  A complete review of systems is obtained and is otherwise  negative.    PHYSICAL EXAM:  Wt Readings from Last 3 Encounters:  09/26/19 130 lb (59 kg)  09/23/19 130 lb 1.6 oz (59 kg)  09/10/19 133 lb 8 oz (60.6 kg)   Temp Readings from Last 3 Encounters:  09/30/19 (!) 95 F (35 C)  09/26/19 97.9 F (36.6 C)  09/25/19 98.1 F (36.7 C) (Oral)   BP Readings from Last 3 Encounters:  09/30/19 107/70  09/26/19 113/74  09/25/19 104/65   Pulse Readings from Last 3 Encounters:  09/30/19 93  09/26/19 (!) 104  09/25/19 (!) 103   Pain Assessment Pain Score: 6  Pain Frequency: Constant Pain Loc: Back/10  Physical exam not performed in light of telephone follow-up new consult format.   KPS = 50  100 - Normal; no complaints; no evidence of disease. 90   - Able to carry on normal activity; minor signs or symptoms of disease. 80   - Normal activity with effort; some signs or symptoms of disease. 45   - Cares for self; unable to carry on normal activity or to do active work. 60   - Requires occasional assistance, but is able to care for most of his personal needs. 50   - Requires considerable assistance and frequent medical care. 68   - Disabled; requires special care and assistance. 43   - Severely disabled; hospital admission is indicated although death not imminent. 9   - Very sick; hospital admission necessary; active supportive treatment necessary. 10   - Moribund; fatal processes progressing rapidly. 0     - Dead  Karnofsky DA, Abelmann WH, Craver LS and Burchenal [facility] [phone]) The use of the nitrogen mustards in the palliative treatment of carcinoma: with particular reference to bronchogenic carcinoma Cancer 1 634-56  LABORATORY DATA:  Lab Results  Component Value Date  WBC 19.7 (H) 09/30/2019   HGB 9.4 (L) 09/30/2019   HCT 30.9 (L) 09/30/2019   MCV 87.3 09/30/2019   PLT 294 09/30/2019   Lab Results  Component Value Date   NA 137 09/30/2019   K 4.1 09/30/2019   CL 100 09/30/2019   CO2 28 09/30/2019   Lab Results   Component Value Date   ALT 11 09/23/2019   AST 142 (H) 09/23/2019   ALKPHOS 1,562 (H) 09/23/2019   BILITOT 0.4 09/23/2019     RADIOGRAPHY: IR Gastrostomy Tube  Result Date: 09/26/2019 INDICATION: 74 year old with metastatic prostate cancer and needs nutritional support. EXAM: PERCUTANEOUS GASTROSTOMY TUBE WITH FLUOROSCOPIC GUIDANCE Physician: Stephan Minister. Anselm Pancoast, MD MEDICATIONS: Ancef 2 g; Antibiotics were administered within 1 hour of the procedure. Glucagon 0.5 mg IV ANESTHESIA/SEDATION: Versed 2.0 mg IV; Fentanyl 100 mcg IV Moderate Sedation Time:  13 minutes The patient was continuously monitored during the procedure by the interventional radiology nurse under my direct supervision. FLUOROSCOPY TIME:  Fluoroscopy Time: 6 minutes, 27 mGy COMPLICATIONS: None immediate. PROCEDURE: The procedure was explained to the patient. The risks and benefits of the procedure were discussed and the patient's questions were addressed. Informed consent was obtained from the patient. The patient was placed on the interventional table. Fluoroscopy demonstrated air in the transverse colon. An orogastric tube was placed with fluoroscopic guidance. The anterior abdomen was prepped and draped in sterile fashion. Maximal barrier sterile technique was utilized including caps, mask, sterile gowns, sterile gloves, sterile drape, hand hygiene and skin antiseptic. Stomach was inflated with air through the orogastric tube. The skin and subcutaneous tissues were anesthetized with 1% lidocaine. A 17 gauge needle was directed into the distended stomach with fluoroscopic guidance. A wire was advanced into the stomach and a T-fastener was deployed. A 9-French sheath was placed and the orogastric tube was snared using a Gooseneck snare device. The orogastric tube and snare were pulled out of the patient's mouth. The snare device was connected to a 20-French gastrostomy tube. The snare device and gastrostomy tube were pulled through the patient's  mouth and out the anterior abdominal wall. The gastrostomy tube was cut to an appropriate length. Contrast injection through gastrostomy tube confirmed placement within the stomach. Fluoroscopic images were obtained for documentation. The gastrostomy tube was flushed with normal saline. FINDINGS: Stomach was adequately distended with gas and a safe percutaneous window was identified. 14 French gastrostomy tube was placed in the stomach and placement confirmed with contrast injection. Patient developed tachycardia just prior to the procedure. Patient's heart rate was in the 130s. Following the procedure, the patient's heart rate came back into the 90s. Patient's blood pressure remained stable throughout the procedure. IMPRESSION: Successful fluoroscopic guided percutaneous gastrostomy tube placement. Electronically Signed   By: Markus Daft M.D.   On: 09/26/2019 16:26   CT Biopsy  Result Date: 09/30/2019 INDICATION: 74 year old male with a history of prostate cancer and multifocal sclerotic osseous lesions. He presents for CT-guided biopsy of the same. EXAM: CT-guided bone biopsy. MEDICATIONS: None. ANESTHESIA/SEDATION: Moderate (conscious) sedation was employed during this procedure. A total of Versed 2 mg and Fentanyl 100 mcg was administered intravenously. Moderate Sedation Time: 17 minutes. The patient's level of consciousness and vital signs were monitored continuously by radiology nursing throughout the procedure under my direct supervision. FLUOROSCOPY TIME:  None COMPLICATIONS: None immediate. PROCEDURE: Informed written consent was obtained from the patient after a thorough discussion of the procedural risks, benefits and alternatives. All questions were addressed. A timeout  was performed prior to the initiation of the procedure. A planning axial CT scan was performed. The soft tissue and periosteal reaction component of the large right iliac wing mass was successfully identified. A suitable skin entry site  was selected and marked. The overlying skin was sterilely prepped and draped in the standard fashion using chlorhexidine skin prep. Local anesthesia was attained by infiltration with 1% lidocaine. A small dermatotomy was made. Under intermittent CT guidance, an 18 gauge trocar needle was carefully advanced into the soft tissue component of the mass. Multiple 18 gauge core biopsies were then coaxially obtained using the Bio Pince and achieve automated biopsy devices. Samples were placed in formalin and delivered to pathology for further analysis. IMPRESSION: Technically successful CT-guided biopsy of periosteal reaction and soft tissue component of large right iliac wing mass. Electronically Signed   By: Malachy Moan M.D.   On: 09/30/2019 14:25      IMPRESSION/PLAN: This visit was conducted via Telephone to spare the patient unnecessary potential exposure in the healthcare setting during the current COVID-19 pandemic. 1. 74 y.o. 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  Today, we talked to the patient and his sister, Corrie Dandy, about the findings and workup thus far. We discussed the natural history of metastatic prostate carcinoma and general treatment, highlighting the role of palliative radiotherapy in the management. We discussed the available radiation techniques, and focused on the details and logistics of delivery. We reviewed the anticipated acute and late sequelae associated with radiation in this setting. The patient was encouraged to ask questions that were answered to his satisfaction.  At the end of the conversation the patient is interested in moving forward with palliative radiotherapy to the painful sites of osseous metastatic disease in bilateral shoulders, right ninth rib and thoracic spine at T6-7.  The recommendation is for a single fraction of radiation targeting all lesions, to spare the patient multiple trips to and from  the cancer center, traveling from [place], [place].  He and his sister appear to have a good understanding of his disease and our treatment recommendations which are above palliative intent.  He has freely signed written consent to proceed today in the office and will undergo CT simulation this afternoon in anticipation of his radiation treatment early next week.  We will share our discussion with Dr. Candise Che and move forward with treatment planning accordingly.  Given current concerns for patient exposure during the COVID-19 pandemic, this encounter was conducted via telephone. The patient was notified in advance and was offered a MyChart meeting to allow for face to face communication but unfortunately reported that he did not have the appropriate resources/technology to support such a visit and instead preferred to proceed with telephone consult. The patient has given verbal consent for this type of encounter. The time spent during this encounter was 25 minutes. The attendants for this meeting include Margaretmary Dys MD, Ashlyn Bruning PA-C,  [employer]- scribe, and patient, Eric Osborn and Tawanna Solo During the encounter, Margaretmary Dys MD, Ashlyn Bruning PA-C, and scribe, [employer], were located at [facility] Radiation Oncology Department.  Patient, Eric Osborn. and his sister, Corrie Dandy, were located at home.    Marguarite Arbour, PA-C    Margaretmary Dys, MD  [facility]  Radiation Oncology Direct Dial: [phone]  Fax: [phone] [email or web address]  Skype  [email or web address]  This document serves as a record of services personally performed by [employer],  PA-C and Tyler Pita, MD and Freeman Caldron, PA-C. It was created on their behalf by [employer], a trained medical scribe. The creation of this record is based on the scribe's personal observations and the provider's statements to them. This document has been checked and approved by the attending provider.

## 2019-10-10 NOTE — Progress Notes (Signed)
Spoke with patient care taker Danley Danker with patient in back ground answering questions.Patient comp[lains of pain 6/10 to his back area. States he has recently started chemotherapy in center. Uses a walker and a cane to get around.

## 2019-10-13 ENCOUNTER — Telehealth: Payer: Self-pay | Admitting: *Deleted

## 2019-10-13 ENCOUNTER — Telehealth: Payer: Self-pay | Admitting: Radiation Oncology

## 2019-10-13 ENCOUNTER — Telehealth: Payer: Self-pay | Admitting: Hematology

## 2019-10-13 DIAGNOSIS — I959 Hypotension, unspecified: Secondary | ICD-10-CM | POA: Diagnosis not present

## 2019-10-13 DIAGNOSIS — E871 Hypo-osmolality and hyponatremia: Secondary | ICD-10-CM | POA: Diagnosis not present

## 2019-10-13 DIAGNOSIS — C7951 Secondary malignant neoplasm of bone: Secondary | ICD-10-CM | POA: Diagnosis not present

## 2019-10-13 DIAGNOSIS — R000 Tachycardia, unspecified: Secondary | ICD-10-CM | POA: Diagnosis not present

## 2019-10-13 DIAGNOSIS — Z681 Body mass index (BMI) 19 or less, adult: Secondary | ICD-10-CM | POA: Diagnosis not present

## 2019-10-13 DIAGNOSIS — A419 Sepsis, unspecified organism: Secondary | ICD-10-CM | POA: Diagnosis not present

## 2019-10-13 DIAGNOSIS — R52 Pain, unspecified: Secondary | ICD-10-CM | POA: Diagnosis not present

## 2019-10-13 DIAGNOSIS — E43 Unspecified severe protein-calorie malnutrition: Secondary | ICD-10-CM | POA: Diagnosis not present

## 2019-10-13 DIAGNOSIS — C7931 Secondary malignant neoplasm of brain: Secondary | ICD-10-CM | POA: Diagnosis not present

## 2019-10-13 DIAGNOSIS — Z515 Encounter for palliative care: Secondary | ICD-10-CM | POA: Diagnosis not present

## 2019-10-13 DIAGNOSIS — G8220 Paraplegia, unspecified: Secondary | ICD-10-CM | POA: Diagnosis not present

## 2019-10-13 DIAGNOSIS — N390 Urinary tract infection, site not specified: Secondary | ICD-10-CM | POA: Diagnosis not present

## 2019-10-13 DIAGNOSIS — R531 Weakness: Secondary | ICD-10-CM | POA: Diagnosis not present

## 2019-10-13 NOTE — Telephone Encounter (Signed)
Scheduled per los, patient has been called and voicemail was left. 

## 2019-10-13 NOTE — Telephone Encounter (Signed)
Received Epic Secure Chat from Erskine Emery, RN that this patient was taken via EMS to a hospital in [place], [place] for weakness and new onset incontinence. Also, Katharine Look reports the patient's family doesn't believe he will be able to make his follow up appointment on Wednesday. Assured Katharine Look, RN this RN would inform staff and verbalized appreciation for the update.

## 2019-10-13 NOTE — Telephone Encounter (Signed)
Patient's friend/caregiver Eric Osborn called. He has gone by EMS to hospital in [place] (where they live) this morning. She took him to her home on Friday evening to stay with her as he was in a lot of pain and very weak. Patient became increasingly weak over weekend. Yesterday did not get out of bed - very groggy and weak. She stated he soiled himself and had to be lifted out of bed to be cleaned up. She contacted patient's son with this information and also asked that Dr. Irene Limbo be informed and to let [facility] know that he will likely not come to appt there on Wednesday.  Dr. Irene Limbo informed and [place] notified.

## 2019-10-14 ENCOUNTER — Inpatient Hospital Stay: Payer: Medicare Other

## 2019-10-14 ENCOUNTER — Inpatient Hospital Stay: Payer: Medicare Other | Admitting: Hematology

## 2019-10-14 NOTE — Progress Notes (Signed)
Nutrition  Secure chat received from Katharine Look, [place] as patient was taken yesterday to [facility].  All appointments cancelled for today including nutrition.   Tahjae Durr B. Zenia Resides, [place], [place] Registered Dietitian [phone] (pager)

## 2019-10-14 NOTE — Progress Notes (Signed)
°  Radiation Oncology         [phone]) [phone] ________________________________  Name: Eric Osborn. MRN: [id number]  Date: 10/10/2019  DOB: 1945/04/21  SIMULATION AND TREATMENT PLANNING NOTE    ICD-10-CM   1. Prostate cancer metastatic to multiple sites [facility])  C61   2. Prostate cancer metastatic to bone ([facility])  C61    C79.51   3. Bone metastases ([facility])  C79.51     DIAGNOSIS:  74 yo man with painful skeletal metastases from prostate cancer - Scapula, Left; Scapula, Right; Thoracic Spine; Ribs, Right  NARRATIVE:  The patient was brought to the [facility].  Identity was confirmed.  All relevant records and images related to the planned course of therapy were reviewed.  The patient freely provided informed written consent to proceed with treatment after reviewing the details related to the planned course of therapy. The consent form was witnessed and verified by the simulation staff.  Then, the patient was set-up in a stable reproducible  supine position for radiation therapy.  CT images were obtained.  Surface markings were placed.  The CT images were loaded into the planning software.  Then the target and avoidance structures were contoured.  Treatment planning then occurred.  The radiation prescription was entered and confirmed.  Then, I designed and supervised the construction of a total of 8 medically necessary complex treatment devices MLCs to shield critical structures.  I have requested : Isodose Plan.  PLAN:  The patient will receive 8 Gy in 1 fraction.  ________________________________  Sheral Apley Tammi Klippel, M.D.

## 2019-10-15 ENCOUNTER — Telehealth: Payer: Self-pay | Admitting: *Deleted

## 2019-10-15 ENCOUNTER — Encounter: Payer: Self-pay | Admitting: Radiation Oncology

## 2019-10-15 ENCOUNTER — Ambulatory Visit: Payer: Medicare Other

## 2019-10-15 NOTE — Telephone Encounter (Addendum)
10/15/19 - 0900: Received Telephone Advice fax from after hours [facility]: Time stamp on fax 10/14/19 @ 1703. 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 Caller is wanting to speak to PCP to see about care for father going forward."  contact # primary: [phone], #secondary [phone]. Response from [facility]: " Patient is currently admitted to his local hospital .Caller is asking to speak to Dr. Irene Limbo to see about care going forward. Advised caller to contact office tomorrow morning during office hours to speak with Dr. Irene Limbo" Dr. Irene Limbo informed of message and he called son Marx Doig) and spoke with him -

## 2019-10-16 ENCOUNTER — Ambulatory Visit: Payer: Medicare Other

## 2019-10-16 ENCOUNTER — Inpatient Hospital Stay: Payer: Medicare Other

## 2019-10-28 ENCOUNTER — Telehealth: Payer: Self-pay | Admitting: *Deleted

## 2019-10-28 NOTE — Telephone Encounter (Signed)
Contacted patient for update as last information was that patient was admitted to hospital in [place], [place]. Patient answered - stated he was still in the hospital "about the same". Advised patient to contact this office on discharge in order to update appointments. He said ok and disconnected call.

## 2019-10-30 ENCOUNTER — Telehealth: Payer: Self-pay | Admitting: Radiation Oncology

## 2019-10-30 DIAGNOSIS — C7949 Secondary malignant neoplasm of other parts of nervous system: Secondary | ICD-10-CM | POA: Diagnosis not present

## 2019-10-30 DIAGNOSIS — C7951 Secondary malignant neoplasm of bone: Secondary | ICD-10-CM | POA: Diagnosis not present

## 2019-10-30 DIAGNOSIS — C61 Malignant neoplasm of prostate: Secondary | ICD-10-CM | POA: Diagnosis not present

## 2019-10-30 DIAGNOSIS — C787 Secondary malignant neoplasm of liver and intrahepatic bile duct: Secondary | ICD-10-CM | POA: Diagnosis not present

## 2019-10-30 NOTE — Telephone Encounter (Signed)
Aware that Eric Osborn was simulated, but never started treatment due to being admitted in [place] with brain mets I phoned the patient to inquire. Patient's speech garbled and incomprehensible. Phoned patient's sister to inquire further but no answer. Phoned patient's friend, Danley Danker, to inquire. Freda Munro explains the patient has decided against further treatment and will transition to hospice once a bed opens. Informed Ashlyn Bruning, PA-C and treatment team.

## 2019-10-31 DIAGNOSIS — C61 Malignant neoplasm of prostate: Secondary | ICD-10-CM | POA: Diagnosis not present

## 2019-10-31 DIAGNOSIS — C7949 Secondary malignant neoplasm of other parts of nervous system: Secondary | ICD-10-CM | POA: Diagnosis not present

## 2019-10-31 DIAGNOSIS — C787 Secondary malignant neoplasm of liver and intrahepatic bile duct: Secondary | ICD-10-CM | POA: Diagnosis not present

## 2019-10-31 DIAGNOSIS — C7951 Secondary malignant neoplasm of bone: Secondary | ICD-10-CM | POA: Diagnosis not present

## 2019-11-02 DIAGNOSIS — C7949 Secondary malignant neoplasm of other parts of nervous system: Secondary | ICD-10-CM | POA: Diagnosis not present

## 2019-11-02 DIAGNOSIS — C787 Secondary malignant neoplasm of liver and intrahepatic bile duct: Secondary | ICD-10-CM | POA: Diagnosis not present

## 2019-11-02 DIAGNOSIS — C61 Malignant neoplasm of prostate: Secondary | ICD-10-CM | POA: Diagnosis not present

## 2019-11-02 DIAGNOSIS — C7951 Secondary malignant neoplasm of bone: Secondary | ICD-10-CM | POA: Diagnosis not present

## 2019-11-03 DIAGNOSIS — C61 Malignant neoplasm of prostate: Secondary | ICD-10-CM | POA: Diagnosis not present

## 2019-11-03 DIAGNOSIS — C7951 Secondary malignant neoplasm of bone: Secondary | ICD-10-CM | POA: Diagnosis not present

## 2019-11-03 DIAGNOSIS — C787 Secondary malignant neoplasm of liver and intrahepatic bile duct: Secondary | ICD-10-CM | POA: Diagnosis not present

## 2019-11-03 DIAGNOSIS — C7949 Secondary malignant neoplasm of other parts of nervous system: Secondary | ICD-10-CM | POA: Diagnosis not present

## 2019-11-09 DIAGNOSIS — C61 Malignant neoplasm of prostate: Secondary | ICD-10-CM | POA: Diagnosis not present

## 2019-11-09 DIAGNOSIS — C7951 Secondary malignant neoplasm of bone: Secondary | ICD-10-CM | POA: Diagnosis not present

## 2019-11-09 DIAGNOSIS — C787 Secondary malignant neoplasm of liver and intrahepatic bile duct: Secondary | ICD-10-CM | POA: Diagnosis not present

## 2019-11-09 DIAGNOSIS — C7949 Secondary malignant neoplasm of other parts of nervous system: Secondary | ICD-10-CM | POA: Diagnosis not present

## 2019-11-12 ENCOUNTER — Telehealth: Payer: Self-pay | Admitting: *Deleted

## 2019-11-12 NOTE — Telephone Encounter (Signed)
Patient's friend Danley Danker contacted office 11/11/19 to report that Mr. Eric Osborn passed away on June 26, 2022 2019/11/17 while in hospice care.

## 2019-11-26 DEATH — deceased

## 2019-12-24 ENCOUNTER — Other Ambulatory Visit: Payer: Medicare Other

## 2019-12-24 ENCOUNTER — Ambulatory Visit: Payer: Medicare Other

## 2020-04-04 NOTE — Progress Notes (Signed)
Patient had been set up and simulated to start radiation to left scapula, right scapula, thoracic spine and right ribs for palliation of pain from bone metastases.  He was hospitalized in [place] and he was not treated.

## 2020-11-04 IMAGING — XA IR PERC PLACEMENT GASTROSTOMY
5 series · 13 of 19 positions shown · non-contrast
Comparison: none

INDICATION: 73-year-old with metastatic prostate cancer and needs nutritional
support.

[Series 3: fl - angio · 3 of 84 frames shown (1 of 4)]
[frame 13/84]
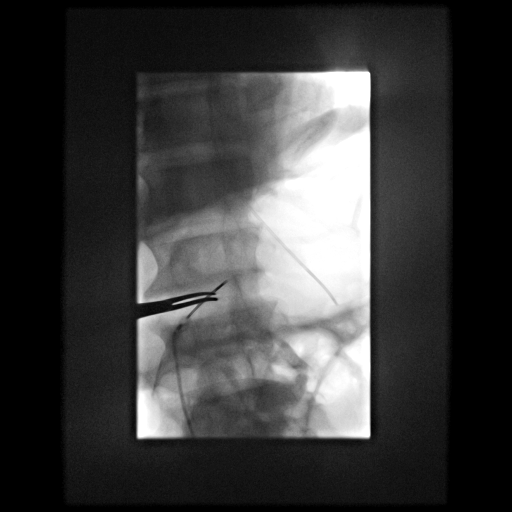
[frame 72/84]
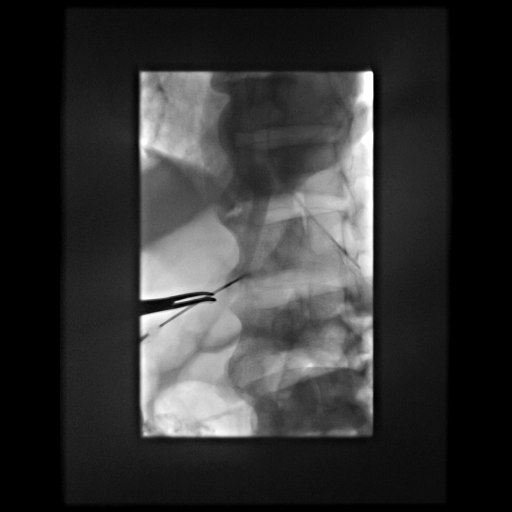
[frame 77/84]
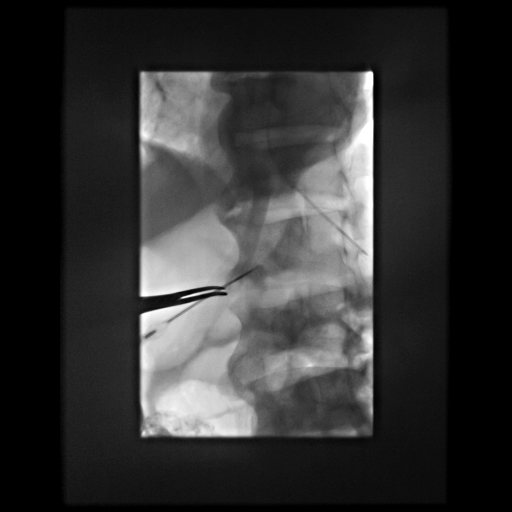

[Series 4: fl - angio · 2 of 23 frames shown (2 of 4)]
[frame 12/23]
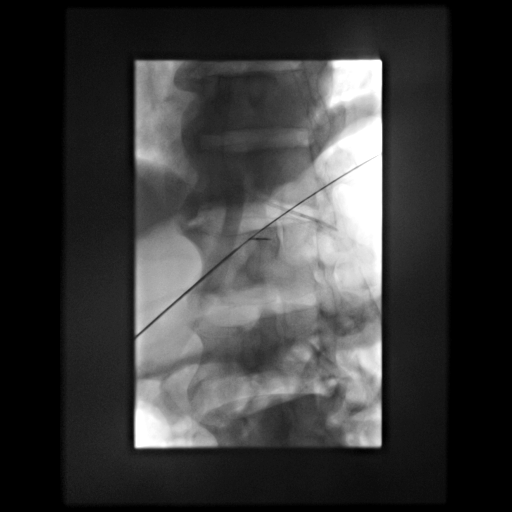
[frame 20/23]
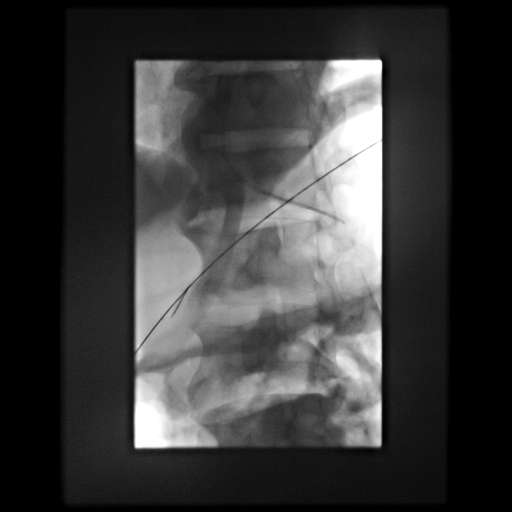

[Series 6: fl - angio · 3 of 26 frames shown (3 of 4)]
[frame 2/26]
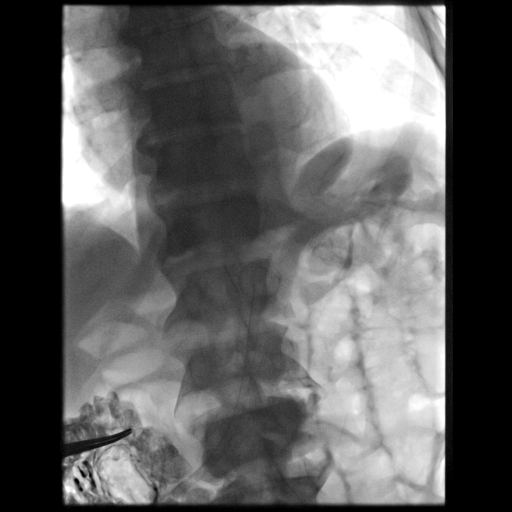
[frame 4/26]
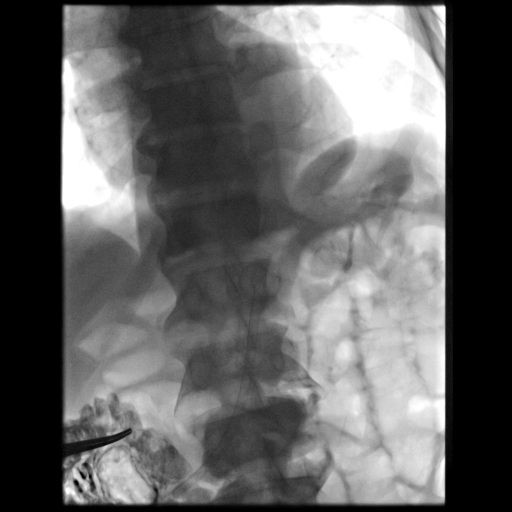
[frame 14/26]
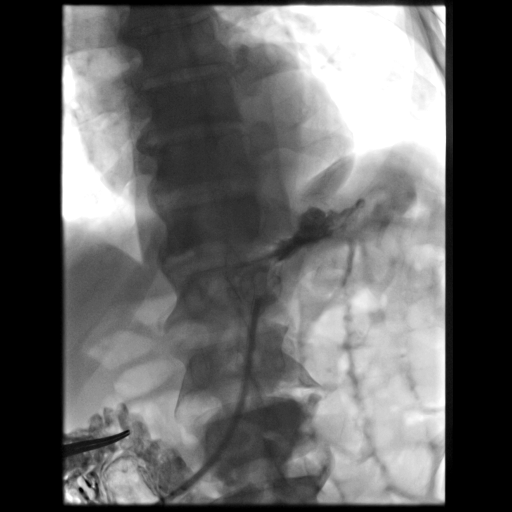

[Series 7: fl - angio · 3 of 27 frames shown (4 of 4)]
[frame 5/27]
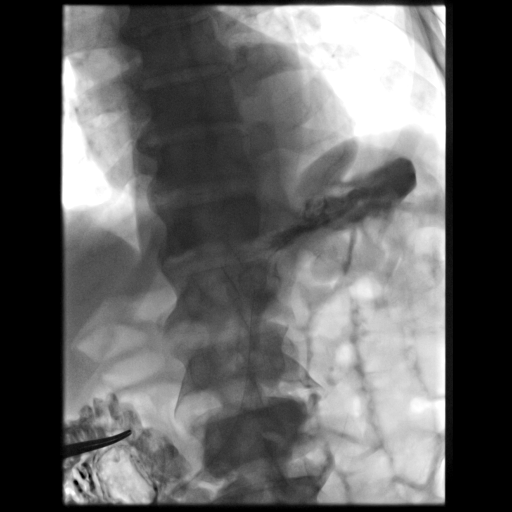
[frame 14/27]
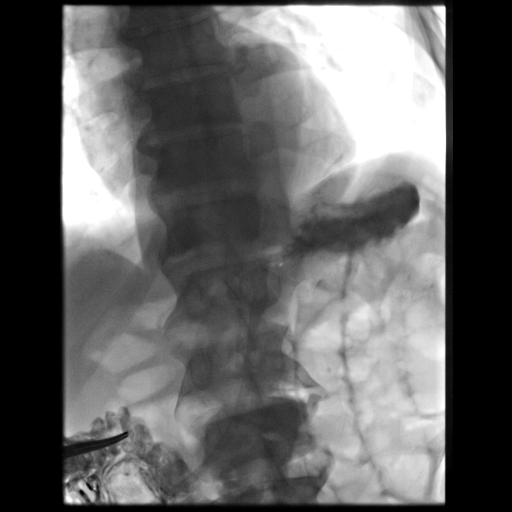
[frame 27/27]
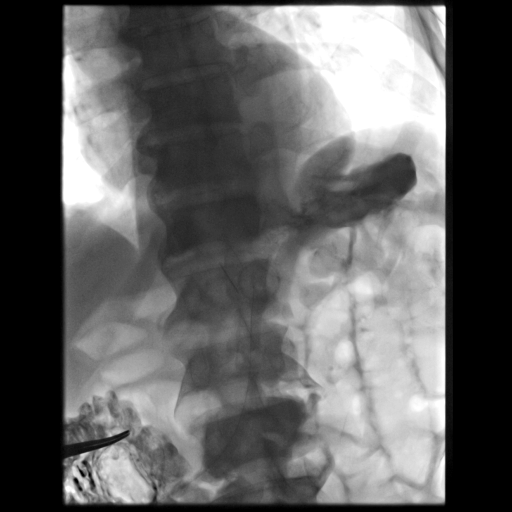

[Series 300: tube placements · 2 of 3 slices shown]
[im 1/3]
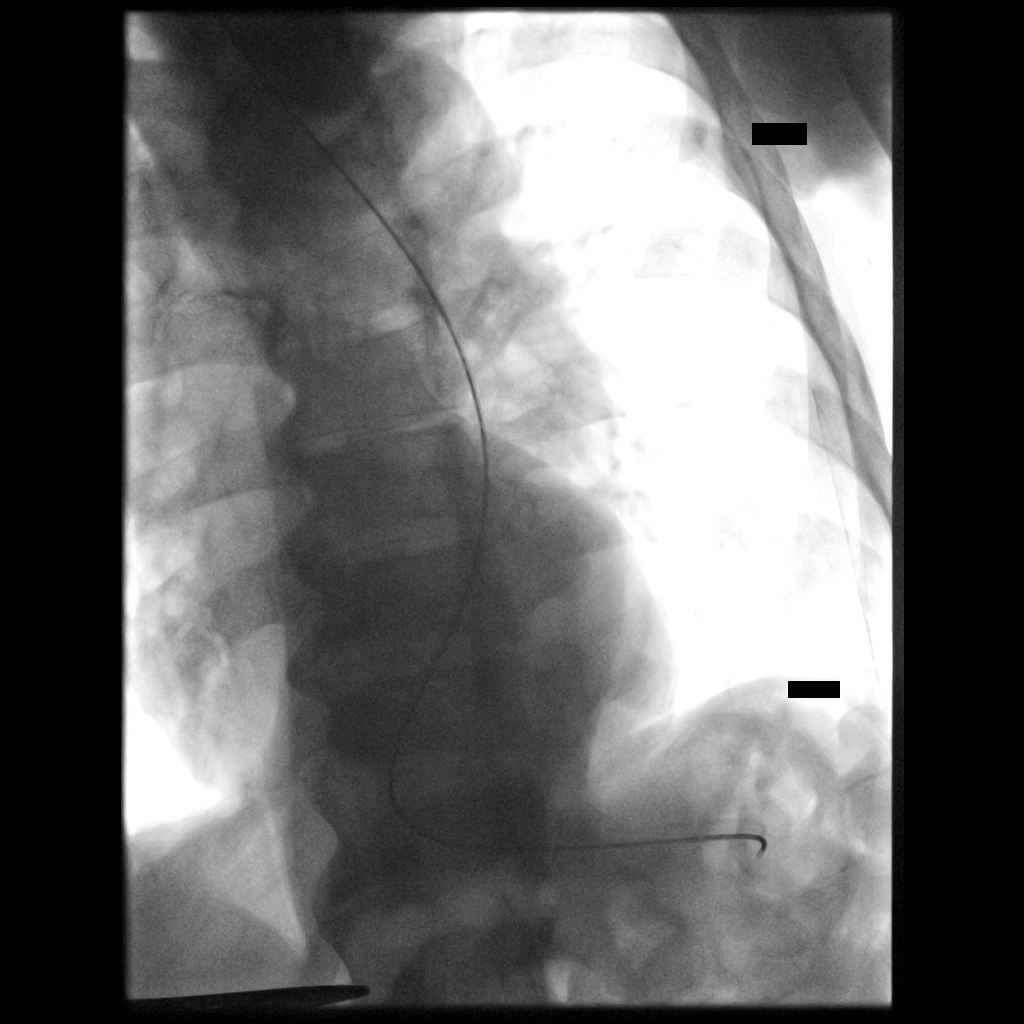
[im 3/3]
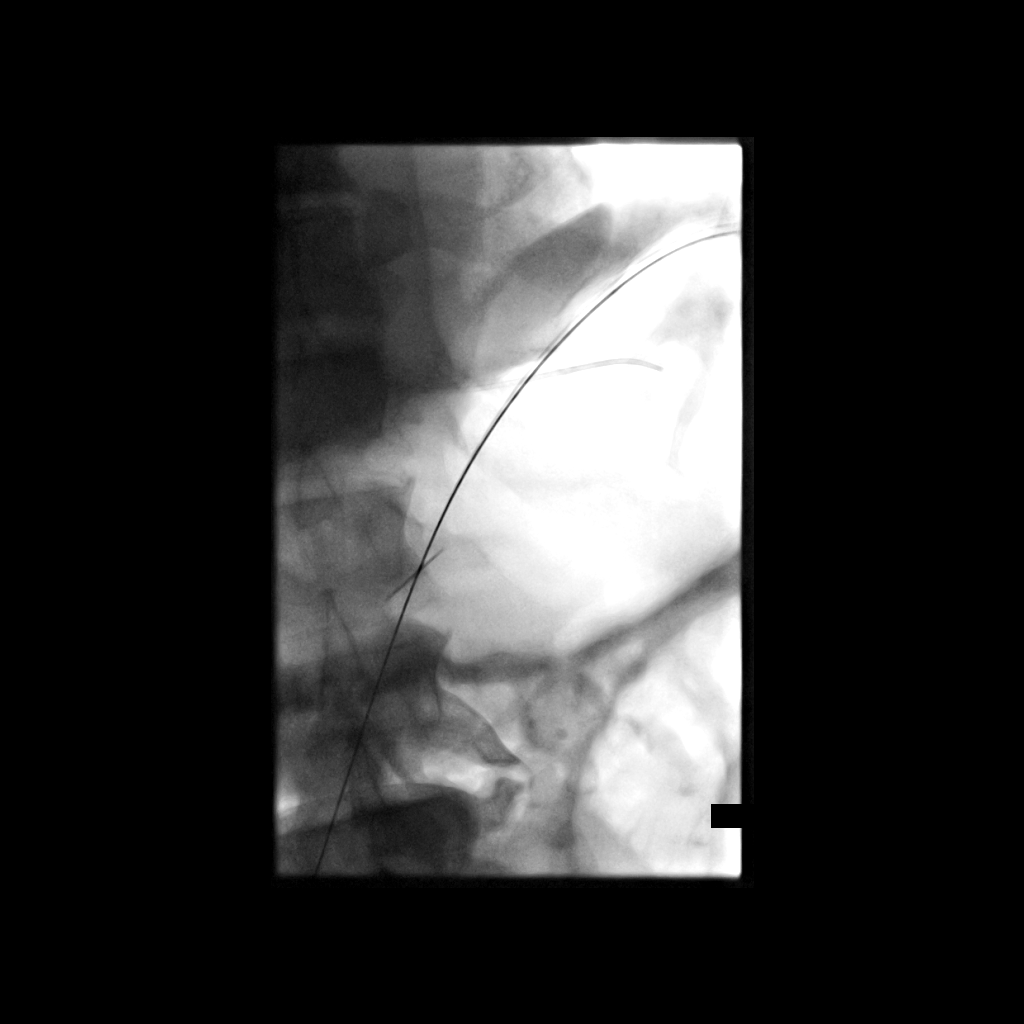

[13 of 19 positions shown; findings below may reference images not displayed]

EXAM:
PERCUTANEOUS GASTROSTOMY TUBE WITH FLUOROSCOPIC GUIDANCE

MEDICATIONS:
Ancef 2 g; Antibiotics were administered within 1 hour of the
procedure. Glucagon 0.5 mg IV

ANESTHESIA/SEDATION:
Versed 2.0 mg IV; Fentanyl 100 mcg IV

Moderate Sedation Time:  13 minutes

The patient was continuously monitored during the procedure by the
interventional radiology nurse under my direct supervision.

FLUOROSCOPY TIME:  Fluoroscopy Time: 6 minutes, 27 mGy

COMPLICATIONS:
None immediate.

PROCEDURE:
The procedure was explained to the patient. The risks and benefits
of the procedure were discussed and the patient's questions were
addressed. Informed consent was obtained from the patient. The
patient was placed on the interventional table. Fluoroscopy
demonstrated air in the transverse colon. An orogastric tube was
placed with fluoroscopic guidance. The anterior abdomen was prepped
and draped in sterile fashion. Maximal barrier sterile technique was
utilized including caps, mask, sterile gowns, sterile gloves,
sterile drape, hand hygiene and skin antiseptic. Stomach was
inflated with air through the orogastric tube. The skin and
subcutaneous tissues were anesthetized with 1% lidocaine. A 17 gauge
needle was directed into the distended stomach with fluoroscopic
guidance. A wire was advanced into the stomach and a T-fastener was
deployed. A 9-French sheath was placed and the orogastric tube was
snared using a [employer] snare device. The orogastric tube and snare
were pulled out of the patient's mouth. The snare device was
connected to a 20-French gastrostomy tube. The snare device and
gastrostomy tube were pulled through the patient's mouth and out the
anterior abdominal wall. The gastrostomy tube was cut to an
appropriate length. Contrast injection through gastrostomy tube
confirmed placement within the stomach. Fluoroscopic images were
obtained for documentation. The gastrostomy tube was flushed with
normal saline.
FINDINGS: Stomach was adequately distended with gas and a safe percutaneous
window was identified. 20 French gastrostomy tube was placed in the
stomach and placement confirmed with contrast injection.

Patient developed tachycardia just prior to the procedure. Patient's
heart rate was in the 130s. Following the procedure, the patient's
heart rate came back into the 90s. Patient's blood pressure remained
stable throughout the procedure.
IMPRESSION: Successful fluoroscopic guided percutaneous gastrostomy tube
placement.

## 2021-06-26 ENCOUNTER — Encounter: Payer: Self-pay | Admitting: Hematology

## 2021-08-27 ENCOUNTER — Other Ambulatory Visit: Payer: Self-pay | Admitting: Nurse Practitioner
